# Patient Record
Sex: Male | Born: 1935 | Race: White | Hispanic: No | Smoking: Former smoker
Health system: Southern US, Community
[De-identification: ages and names within clinical notes are randomized; demographics above are authoritative.]

## PROBLEM LIST (undated history)

## (undated) DIAGNOSIS — E039 Hypothyroidism, unspecified: Secondary | ICD-10-CM

## (undated) DIAGNOSIS — N183 Chronic kidney disease, stage 3 unspecified: Secondary | ICD-10-CM

## (undated) DIAGNOSIS — Z4502 Encounter for adjustment and management of automatic implantable cardiac defibrillator: Secondary | ICD-10-CM

## (undated) DIAGNOSIS — I10 Essential (primary) hypertension: Secondary | ICD-10-CM

## (undated) DIAGNOSIS — I5022 Chronic systolic (congestive) heart failure: Secondary | ICD-10-CM

## (undated) DIAGNOSIS — I739 Peripheral vascular disease, unspecified: Secondary | ICD-10-CM

## (undated) DIAGNOSIS — Z951 Presence of aortocoronary bypass graft: Secondary | ICD-10-CM

## (undated) DIAGNOSIS — J449 Chronic obstructive pulmonary disease, unspecified: Secondary | ICD-10-CM

## (undated) DIAGNOSIS — I480 Paroxysmal atrial fibrillation: Secondary | ICD-10-CM

## (undated) DIAGNOSIS — M109 Gout, unspecified: Secondary | ICD-10-CM

## (undated) DIAGNOSIS — N2889 Other specified disorders of kidney and ureter: Secondary | ICD-10-CM

## (undated) DIAGNOSIS — Z9862 Peripheral vascular angioplasty status: Secondary | ICD-10-CM

## (undated) HISTORY — PX: CARDIAC CATHETERIZATION: SHX172

## (undated) HISTORY — PX: CORONARY ARTERY BYPASS GRAFT: SHX141

---

## 2001-12-03 DIAGNOSIS — Z951 Presence of aortocoronary bypass graft: Secondary | ICD-10-CM

## 2001-12-03 HISTORY — DX: Presence of aortocoronary bypass graft: Z95.1

## 2002-04-28 ENCOUNTER — Ambulatory Visit (HOSPITAL_COMMUNITY): Admission: RE | Admit: 2002-04-28 | Discharge: 2002-04-28 | Payer: Self-pay | Admitting: Family Medicine

## 2002-04-28 ENCOUNTER — Encounter: Payer: Self-pay | Admitting: Family Medicine

## 2002-05-06 ENCOUNTER — Inpatient Hospital Stay (HOSPITAL_COMMUNITY): Admission: RE | Admit: 2002-05-06 | Discharge: 2002-05-17 | Payer: Self-pay | Admitting: Cardiovascular Disease

## 2002-05-06 ENCOUNTER — Encounter: Payer: Self-pay | Admitting: Cardiovascular Disease

## 2002-05-07 ENCOUNTER — Encounter: Payer: Self-pay | Admitting: Thoracic Surgery (Cardiothoracic Vascular Surgery)

## 2002-05-08 ENCOUNTER — Encounter: Payer: Self-pay | Admitting: Thoracic Surgery (Cardiothoracic Vascular Surgery)

## 2002-05-09 ENCOUNTER — Encounter: Payer: Self-pay | Admitting: Thoracic Surgery (Cardiothoracic Vascular Surgery)

## 2002-05-10 ENCOUNTER — Encounter: Payer: Self-pay | Admitting: Cardiothoracic Surgery

## 2002-05-13 ENCOUNTER — Encounter: Payer: Self-pay | Admitting: Thoracic Surgery (Cardiothoracic Vascular Surgery)

## 2002-05-29 ENCOUNTER — Encounter: Payer: Self-pay | Admitting: Family Medicine

## 2002-05-29 ENCOUNTER — Ambulatory Visit (HOSPITAL_COMMUNITY): Admission: RE | Admit: 2002-05-29 | Discharge: 2002-05-29 | Payer: Self-pay | Admitting: Family Medicine

## 2002-06-01 ENCOUNTER — Encounter
Admission: RE | Admit: 2002-06-01 | Discharge: 2002-06-01 | Payer: Self-pay | Admitting: Thoracic Surgery (Cardiothoracic Vascular Surgery)

## 2002-06-01 ENCOUNTER — Encounter: Payer: Self-pay | Admitting: Thoracic Surgery (Cardiothoracic Vascular Surgery)

## 2002-06-02 ENCOUNTER — Encounter (HOSPITAL_COMMUNITY): Admission: RE | Admit: 2002-06-02 | Discharge: 2002-07-02 | Payer: Self-pay | Admitting: *Deleted

## 2002-07-03 ENCOUNTER — Encounter (HOSPITAL_COMMUNITY): Admission: RE | Admit: 2002-07-03 | Discharge: 2002-08-02 | Payer: Self-pay | Admitting: *Deleted

## 2002-08-11 ENCOUNTER — Ambulatory Visit (HOSPITAL_COMMUNITY): Admission: RE | Admit: 2002-08-11 | Discharge: 2002-08-12 | Payer: Self-pay | Admitting: Cardiovascular Disease

## 2002-08-17 ENCOUNTER — Encounter: Payer: Self-pay | Admitting: Thoracic Surgery (Cardiothoracic Vascular Surgery)

## 2002-08-17 ENCOUNTER — Encounter
Admission: RE | Admit: 2002-08-17 | Discharge: 2002-08-17 | Payer: Self-pay | Admitting: Thoracic Surgery (Cardiothoracic Vascular Surgery)

## 2002-08-19 ENCOUNTER — Encounter (HOSPITAL_COMMUNITY): Admission: RE | Admit: 2002-08-19 | Discharge: 2002-09-18 | Payer: Self-pay | Admitting: *Deleted

## 2002-09-04 ENCOUNTER — Other Ambulatory Visit: Admission: RE | Admit: 2002-09-04 | Discharge: 2002-09-04 | Payer: Self-pay | Admitting: Family Medicine

## 2002-09-21 ENCOUNTER — Encounter (HOSPITAL_COMMUNITY): Admission: RE | Admit: 2002-09-21 | Discharge: 2002-10-21 | Payer: Self-pay | Admitting: *Deleted

## 2003-10-05 ENCOUNTER — Ambulatory Visit (HOSPITAL_COMMUNITY): Admission: RE | Admit: 2003-10-05 | Discharge: 2003-10-05 | Payer: Self-pay | Admitting: Family Medicine

## 2003-12-01 ENCOUNTER — Ambulatory Visit (HOSPITAL_COMMUNITY): Admission: RE | Admit: 2003-12-01 | Discharge: 2003-12-01 | Payer: Self-pay | Admitting: *Deleted

## 2003-12-02 ENCOUNTER — Inpatient Hospital Stay (HOSPITAL_COMMUNITY): Admission: AD | Admit: 2003-12-02 | Discharge: 2003-12-08 | Payer: Self-pay | Admitting: *Deleted

## 2003-12-23 ENCOUNTER — Ambulatory Visit (HOSPITAL_COMMUNITY): Admission: RE | Admit: 2003-12-23 | Discharge: 2003-12-23 | Payer: Self-pay | Admitting: Cardiovascular Disease

## 2004-03-03 ENCOUNTER — Encounter (HOSPITAL_COMMUNITY): Admission: RE | Admit: 2004-03-03 | Discharge: 2004-04-02 | Payer: Self-pay | Admitting: Pediatrics

## 2004-04-03 ENCOUNTER — Encounter (HOSPITAL_COMMUNITY): Admission: RE | Admit: 2004-04-03 | Discharge: 2004-05-03 | Payer: Self-pay | Admitting: *Deleted

## 2004-05-05 ENCOUNTER — Encounter (HOSPITAL_COMMUNITY): Admission: RE | Admit: 2004-05-05 | Discharge: 2004-06-04 | Payer: Self-pay | Admitting: *Deleted

## 2004-12-07 ENCOUNTER — Ambulatory Visit (HOSPITAL_COMMUNITY): Admission: RE | Admit: 2004-12-07 | Discharge: 2004-12-07 | Payer: Self-pay | Admitting: Family Medicine

## 2006-04-19 ENCOUNTER — Ambulatory Visit (HOSPITAL_COMMUNITY): Admission: RE | Admit: 2006-04-19 | Discharge: 2006-04-19 | Payer: Self-pay | Admitting: Internal Medicine

## 2006-07-23 ENCOUNTER — Ambulatory Visit (HOSPITAL_COMMUNITY): Admission: RE | Admit: 2006-07-23 | Discharge: 2006-07-23 | Payer: Self-pay | Admitting: *Deleted

## 2007-01-30 ENCOUNTER — Ambulatory Visit (HOSPITAL_COMMUNITY): Admission: RE | Admit: 2007-01-30 | Discharge: 2007-01-30 | Payer: Self-pay | Admitting: Family Medicine

## 2007-02-26 ENCOUNTER — Ambulatory Visit (HOSPITAL_COMMUNITY): Admission: RE | Admit: 2007-02-26 | Discharge: 2007-02-26 | Payer: Self-pay | Admitting: Family Medicine

## 2007-03-12 ENCOUNTER — Ambulatory Visit (HOSPITAL_COMMUNITY): Admission: RE | Admit: 2007-03-12 | Discharge: 2007-03-12 | Payer: Self-pay | Admitting: Family Medicine

## 2007-03-26 ENCOUNTER — Emergency Department (HOSPITAL_COMMUNITY): Admission: EM | Admit: 2007-03-26 | Discharge: 2007-03-26 | Payer: Self-pay | Admitting: Emergency Medicine

## 2007-09-03 ENCOUNTER — Ambulatory Visit (HOSPITAL_COMMUNITY): Admission: RE | Admit: 2007-09-03 | Discharge: 2007-09-03 | Payer: Self-pay | Admitting: Internal Medicine

## 2007-09-28 ENCOUNTER — Emergency Department (HOSPITAL_COMMUNITY): Admission: EM | Admit: 2007-09-28 | Discharge: 2007-09-28 | Payer: Self-pay | Admitting: Emergency Medicine

## 2007-11-05 ENCOUNTER — Ambulatory Visit (HOSPITAL_COMMUNITY): Admission: RE | Admit: 2007-11-05 | Discharge: 2007-11-05 | Payer: Self-pay | Admitting: Family Medicine

## 2007-11-06 ENCOUNTER — Inpatient Hospital Stay (HOSPITAL_COMMUNITY): Admission: EM | Admit: 2007-11-06 | Discharge: 2007-11-11 | Payer: Self-pay | Admitting: Emergency Medicine

## 2007-11-19 ENCOUNTER — Ambulatory Visit (HOSPITAL_COMMUNITY): Admission: RE | Admit: 2007-11-19 | Discharge: 2007-11-19 | Payer: Self-pay | Admitting: Gastroenterology

## 2007-11-24 ENCOUNTER — Ambulatory Visit: Payer: Self-pay | Admitting: Oncology

## 2007-12-16 LAB — CBC & DIFF AND RETIC
Eosinophils Absolute: 0.2 10*3/uL (ref 0.0–0.5)
HCT: 31.6 % — ABNORMAL LOW (ref 38.7–49.9)
LYMPH%: 13.6 % — ABNORMAL LOW (ref 14.0–48.0)
MONO#: 0.5 10*3/uL (ref 0.1–0.9)
NEUT#: 7.6 10*3/uL — ABNORMAL HIGH (ref 1.5–6.5)
NEUT%: 79.3 % — ABNORMAL HIGH (ref 40.0–75.0)
Platelets: 273 10*3/uL (ref 145–400)
RBC: 4.01 10*6/uL — ABNORMAL LOW (ref 4.20–5.71)
Retic %: 1.6 % (ref 0.7–2.3)
WBC: 9.6 10*3/uL (ref 4.0–10.0)

## 2007-12-18 LAB — ERYTHROPOIETIN: Erythropoietin: 48.8 m[IU]/mL — ABNORMAL HIGH (ref 2.6–34.0)

## 2007-12-18 LAB — SPEP & IFE WITH QIG
Albumin ELP: 49.4 % — ABNORMAL LOW (ref 55.8–66.1)
Alpha-1-Globulin: 6.3 % — ABNORMAL HIGH (ref 2.9–4.9)
Alpha-2-Globulin: 17.7 % — ABNORMAL HIGH (ref 7.1–11.8)
Gamma Globulin: 13.9 % (ref 11.1–18.8)
IgM, Serum: 158 mg/dL (ref 60–263)

## 2007-12-18 LAB — COMPREHENSIVE METABOLIC PANEL
Albumin: 3.9 g/dL (ref 3.5–5.2)
Alkaline Phosphatase: 69 U/L (ref 39–117)
Glucose, Bld: 468 mg/dL — ABNORMAL HIGH (ref 70–99)
Potassium: 4.2 mEq/L (ref 3.5–5.3)
Sodium: 137 mEq/L (ref 135–145)
Total Protein: 7.1 g/dL (ref 6.0–8.3)

## 2007-12-18 LAB — KAPPA/LAMBDA LIGHT CHAINS: Kappa:Lambda Ratio: 0.93 (ref 0.26–1.65)

## 2007-12-18 LAB — VITAMIN B12: Vitamin B-12: 503 pg/mL (ref 211–911)

## 2007-12-18 LAB — FOLATE: Folate: >20 ng/mL

## 2007-12-18 LAB — IRON AND TIBC: UIBC: 382 ug/dL

## 2008-01-12 ENCOUNTER — Ambulatory Visit: Payer: Self-pay | Admitting: Oncology

## 2008-01-14 LAB — CBC WITH DIFFERENTIAL/PLATELET
BASO%: 0.5 % (ref 0.0–2.0)
Basophils Absolute: 0 10*3/uL (ref 0.0–0.1)
EOS%: 1.9 % (ref 0.0–7.0)
HGB: 10.7 g/dL — ABNORMAL LOW (ref 13.0–17.1)
MCH: 26.3 pg — ABNORMAL LOW (ref 28.0–33.4)
MCHC: 32.4 g/dL (ref 32.0–35.9)
MCV: 81 fL — ABNORMAL LOW (ref 81.6–98.0)
MONO%: 8 % (ref 0.0–13.0)
RBC: 4.07 10*6/uL — ABNORMAL LOW (ref 4.20–5.71)
RDW: 23 % — ABNORMAL HIGH (ref 11.2–14.6)
lymph#: 1.2 10*3/uL (ref 0.9–3.3)

## 2008-01-14 LAB — IRON AND TIBC
Iron: 45 ug/dL (ref 42–165)
TIBC: 283 ug/dL (ref 215–435)
UIBC: 238 ug/dL

## 2008-04-09 ENCOUNTER — Ambulatory Visit: Payer: Self-pay | Admitting: Oncology

## 2008-04-13 LAB — CBC WITH DIFFERENTIAL/PLATELET
BASO%: 1.4 % (ref 0.0–2.0)
Basophils Absolute: 0.1 10*3/uL (ref 0.0–0.1)
Eosinophils Absolute: 0.2 10*3/uL (ref 0.0–0.5)
HCT: 35.7 % — ABNORMAL LOW (ref 38.7–49.9)
HGB: 12.3 g/dL — ABNORMAL LOW (ref 13.0–17.1)
MCHC: 34.4 g/dL (ref 32.0–35.9)
MONO#: 0.6 10*3/uL (ref 0.1–0.9)
NEUT#: 5.6 10*3/uL (ref 1.5–6.5)
NEUT%: 70.9 % (ref 40.0–75.0)
WBC: 7.9 10*3/uL (ref 4.0–10.0)
lymph#: 1.5 10*3/uL (ref 0.9–3.3)

## 2008-04-13 LAB — FERRITIN: Ferritin: 41 ng/mL (ref 22–322)

## 2008-04-13 LAB — COMPREHENSIVE METABOLIC PANEL
ALT: 8 U/L (ref 0–53)
BUN: 29 mg/dL — ABNORMAL HIGH (ref 6–23)
CO2: 23 mEq/L (ref 19–32)
Calcium: 8.8 mg/dL (ref 8.4–10.5)
Creatinine, Ser: 1.53 mg/dL — ABNORMAL HIGH (ref 0.40–1.50)
Total Bilirubin: 0.6 mg/dL (ref 0.3–1.2)

## 2008-04-13 LAB — IRON AND TIBC
%SAT: 16 % — ABNORMAL LOW (ref 20–55)
Iron: 49 ug/dL (ref 42–165)
TIBC: 316 ug/dL (ref 215–435)

## 2008-05-20 ENCOUNTER — Inpatient Hospital Stay (HOSPITAL_COMMUNITY): Admission: EM | Admit: 2008-05-20 | Discharge: 2008-05-27 | Payer: Self-pay | Admitting: Emergency Medicine

## 2008-08-26 ENCOUNTER — Ambulatory Visit (HOSPITAL_COMMUNITY): Admission: RE | Admit: 2008-08-26 | Discharge: 2008-08-26 | Payer: Self-pay | Admitting: Family Medicine

## 2009-04-02 ENCOUNTER — Observation Stay (HOSPITAL_COMMUNITY): Admission: EM | Admit: 2009-04-02 | Discharge: 2009-04-02 | Payer: Self-pay | Admitting: *Deleted

## 2009-04-02 ENCOUNTER — Encounter: Payer: Self-pay | Admitting: Emergency Medicine

## 2009-12-09 ENCOUNTER — Ambulatory Visit (HOSPITAL_COMMUNITY): Admission: RE | Admit: 2009-12-09 | Discharge: 2009-12-09 | Payer: Self-pay | Admitting: Internal Medicine

## 2009-12-12 ENCOUNTER — Inpatient Hospital Stay (HOSPITAL_COMMUNITY): Admission: AD | Admit: 2009-12-12 | Discharge: 2009-12-17 | Payer: Self-pay

## 2010-04-02 DIAGNOSIS — Z4502 Encounter for adjustment and management of automatic implantable cardiac defibrillator: Secondary | ICD-10-CM

## 2010-04-02 HISTORY — DX: Encounter for adjustment and management of automatic implantable cardiac defibrillator: Z45.02

## 2010-04-03 ENCOUNTER — Inpatient Hospital Stay (HOSPITAL_COMMUNITY): Admission: RE | Admit: 2010-04-03 | Discharge: 2010-04-04 | Payer: Self-pay | Admitting: Cardiovascular Disease

## 2010-05-29 ENCOUNTER — Ambulatory Visit (HOSPITAL_COMMUNITY): Admission: RE | Admit: 2010-05-29 | Discharge: 2010-05-29 | Payer: Self-pay | Admitting: Cardiovascular Disease

## 2011-02-05 ENCOUNTER — Other Ambulatory Visit (HOSPITAL_COMMUNITY): Payer: Self-pay | Admitting: Cardiovascular Disease

## 2011-02-05 ENCOUNTER — Ambulatory Visit (HOSPITAL_COMMUNITY)
Admission: RE | Admit: 2011-02-05 | Discharge: 2011-02-05 | Disposition: A | Discharge status: Home / Self Care | Payer: Medicare Other | Source: Ambulatory Visit | Attending: Cardiovascular Disease | Admitting: Cardiovascular Disease

## 2011-02-05 DIAGNOSIS — R609 Edema, unspecified: Secondary | ICD-10-CM

## 2011-02-05 DIAGNOSIS — M25549 Pain in joints of unspecified hand: Secondary | ICD-10-CM | POA: Insufficient documentation

## 2011-02-05 DIAGNOSIS — M899 Disorder of bone, unspecified: Secondary | ICD-10-CM | POA: Insufficient documentation

## 2011-02-05 DIAGNOSIS — M25449 Effusion, unspecified hand: Secondary | ICD-10-CM | POA: Insufficient documentation

## 2011-02-18 LAB — COMPREHENSIVE METABOLIC PANEL
AST: 25 U/L (ref 0–37)
BUN: 34 mg/dL — ABNORMAL HIGH (ref 6–23)
CO2: 26 mEq/L (ref 19–32)
Calcium: 9.1 mg/dL (ref 8.4–10.5)
Chloride: 106 mEq/L (ref 96–112)
Creatinine, Ser: 2.41 mg/dL — ABNORMAL HIGH (ref 0.4–1.5)
GFR calc Af Amer: 32 mL/min — ABNORMAL LOW (ref >60)
GFR calc non Af Amer: 27 mL/min — ABNORMAL LOW (ref >60)
Glucose, Bld: 229 mg/dL — ABNORMAL HIGH (ref 70–99)
Total Bilirubin: 0.7 mg/dL (ref 0.3–1.2)

## 2011-02-18 LAB — GLUCOSE, CAPILLARY
Glucose-Capillary: 174 mg/dL — ABNORMAL HIGH (ref 70–99)
Glucose-Capillary: 231 mg/dL — ABNORMAL HIGH (ref 70–99)
Glucose-Capillary: 274 mg/dL — ABNORMAL HIGH (ref 70–99)
Glucose-Capillary: 278 mg/dL — ABNORMAL HIGH (ref 70–99)
Glucose-Capillary: 282 mg/dL — ABNORMAL HIGH (ref 70–99)
Glucose-Capillary: 296 mg/dL — ABNORMAL HIGH (ref 70–99)
Glucose-Capillary: 319 mg/dL — ABNORMAL HIGH (ref 70–99)
Glucose-Capillary: 360 mg/dL — ABNORMAL HIGH (ref 70–99)
Glucose-Capillary: 361 mg/dL — ABNORMAL HIGH (ref 70–99)
Glucose-Capillary: 383 mg/dL — ABNORMAL HIGH (ref 70–99)
Glucose-Capillary: 415 mg/dL — ABNORMAL HIGH (ref 70–99)

## 2011-02-18 LAB — DIFFERENTIAL
Basophils Absolute: 0 10*3/uL (ref 0.0–0.1)
Basophils Absolute: 0 10*3/uL (ref 0.0–0.1)
Basophils Absolute: 0 10*3/uL (ref 0.0–0.1)
Basophils Relative: 0 % (ref 0–1)
Basophils Relative: 0 % (ref 0–1)
Basophils Relative: 1 % (ref 0–1)
Eosinophils Absolute: 0.2 10*3/uL (ref 0.0–0.7)
Eosinophils Absolute: 0.3 10*3/uL (ref 0.0–0.7)
Eosinophils Absolute: 0.4 10*3/uL (ref 0.0–0.7)
Eosinophils Relative: 2 % (ref 0–5)
Eosinophils Relative: 3 % (ref 0–5)
Lymphocytes Relative: 13 % (ref 12–46)
Lymphocytes Relative: 18 % (ref 12–46)
Lymphs Abs: 1.2 10*3/uL (ref 0.7–4.0)
Lymphs Abs: 1.3 10*3/uL (ref 0.7–4.0)
Lymphs Abs: 2 10*3/uL (ref 0.7–4.0)
Monocytes Absolute: 0.7 10*3/uL (ref 0.1–1.0)
Monocytes Absolute: 0.9 10*3/uL (ref 0.1–1.0)
Monocytes Relative: 6 % (ref 3–12)
Neutro Abs: 8.2 10*3/uL — ABNORMAL HIGH (ref 1.7–7.7)
Neutro Abs: 9.4 10*3/uL — ABNORMAL HIGH (ref 1.7–7.7)
Neutrophils Relative %: 71 % (ref 43–77)
Neutrophils Relative %: 73 % (ref 43–77)
Neutrophils Relative %: 75 % (ref 43–77)
Neutrophils Relative %: 76 % (ref 43–77)
Neutrophils Relative %: 79 % — ABNORMAL HIGH (ref 43–77)

## 2011-02-18 LAB — URINALYSIS, ROUTINE W REFLEX MICROSCOPIC
Glucose, UA: 500 mg/dL — AB
Glucose, UA: NEGATIVE mg/dL
Hgb urine dipstick: NEGATIVE
Ketones, ur: NEGATIVE mg/dL
Nitrite: NEGATIVE
Protein, ur: NEGATIVE mg/dL
Protein, ur: NEGATIVE mg/dL
pH: 5.5 (ref 5.0–8.0)
pH: 6 (ref 5.0–8.0)

## 2011-02-18 LAB — BASIC METABOLIC PANEL
BUN: 27 mg/dL — ABNORMAL HIGH (ref 6–23)
BUN: 33 mg/dL — ABNORMAL HIGH (ref 6–23)
CO2: 23 mEq/L (ref 19–32)
CO2: 26 mEq/L (ref 19–32)
Calcium: 9 mg/dL (ref 8.4–10.5)
Calcium: 9.1 mg/dL (ref 8.4–10.5)
Calcium: 9.3 mg/dL (ref 8.4–10.5)
Calcium: 9.4 mg/dL (ref 8.4–10.5)
Chloride: 104 mEq/L (ref 96–112)
Chloride: 99 mEq/L (ref 96–112)
Creatinine, Ser: 2.05 mg/dL — ABNORMAL HIGH (ref 0.4–1.5)
Creatinine, Ser: 2.38 mg/dL — ABNORMAL HIGH (ref 0.4–1.5)
GFR calc Af Amer: 32 mL/min — ABNORMAL LOW (ref >60)
GFR calc Af Amer: 35 mL/min — ABNORMAL LOW (ref >60)
GFR calc Af Amer: 39 mL/min — ABNORMAL LOW (ref >60)
GFR calc Af Amer: 43 mL/min — ABNORMAL LOW (ref >60)
GFR calc non Af Amer: 26 mL/min — ABNORMAL LOW (ref >60)
GFR calc non Af Amer: 29 mL/min — ABNORMAL LOW (ref >60)
Glucose, Bld: 294 mg/dL — ABNORMAL HIGH (ref 70–99)
Glucose, Bld: 327 mg/dL — ABNORMAL HIGH (ref 70–99)
Potassium: 3.6 mEq/L (ref 3.5–5.1)
Sodium: 136 mEq/L (ref 135–145)
Sodium: 137 mEq/L (ref 135–145)
Sodium: 137 mEq/L (ref 135–145)

## 2011-02-18 LAB — CBC
HCT: 33.3 % — ABNORMAL LOW (ref 39.0–52.0)
HCT: 33.5 % — ABNORMAL LOW (ref 39.0–52.0)
Hemoglobin: 11.1 g/dL — ABNORMAL LOW (ref 13.0–17.0)
Hemoglobin: 11.3 g/dL — ABNORMAL LOW (ref 13.0–17.0)
Hemoglobin: 11.4 g/dL — ABNORMAL LOW (ref 13.0–17.0)
MCHC: 33.2 g/dL (ref 30.0–36.0)
MCHC: 33.7 g/dL (ref 30.0–36.0)
MCHC: 33.8 g/dL (ref 30.0–36.0)
MCHC: 34.1 g/dL (ref 30.0–36.0)
MCV: 91.5 fL (ref 78.0–100.0)
MCV: 91.6 fL (ref 78.0–100.0)
MCV: 92.4 fL (ref 78.0–100.0)
MCV: 92.5 fL (ref 78.0–100.0)
Platelets: 182 10*3/uL (ref 150–400)
Platelets: 195 10*3/uL (ref 150–400)
RBC: 3.6 MIL/uL — ABNORMAL LOW (ref 4.22–5.81)
RBC: 3.66 MIL/uL — ABNORMAL LOW (ref 4.22–5.81)
RDW: 14.1 % (ref 11.5–15.5)
RDW: 14.4 % (ref 11.5–15.5)
WBC: 11.8 10*3/uL — ABNORMAL HIGH (ref 4.0–10.5)
WBC: 9.4 10*3/uL (ref 4.0–10.5)
WBC: 9.6 10*3/uL (ref 4.0–10.5)

## 2011-02-18 LAB — URINE CULTURE

## 2011-02-18 LAB — VANCOMYCIN, TROUGH: Vancomycin Tr: 10.6 ug/mL (ref 10.0–20.0)

## 2011-02-18 LAB — CULTURE, BLOOD (ROUTINE X 2)
Report Status: 1152011
Report Status: 1152011

## 2011-02-18 LAB — HEMOGLOBIN A1C
Hgb A1c MFr Bld: 9.3 % — ABNORMAL HIGH (ref 4.6–6.1)
Mean Plasma Glucose: 220 mg/dL

## 2011-02-20 LAB — SURGICAL PCR SCREEN
MRSA, PCR: NEGATIVE
Staphylococcus aureus: NEGATIVE

## 2011-02-20 LAB — GLUCOSE, CAPILLARY
Glucose-Capillary: 212 mg/dL — ABNORMAL HIGH (ref 70–99)
Glucose-Capillary: 232 mg/dL — ABNORMAL HIGH (ref 70–99)
Glucose-Capillary: 380 mg/dL — ABNORMAL HIGH (ref 70–99)

## 2011-02-20 LAB — BASIC METABOLIC PANEL
Calcium: 9.5 mg/dL (ref 8.4–10.5)
GFR calc non Af Amer: 52 mL/min — ABNORMAL LOW (ref >60)
Glucose, Bld: 222 mg/dL — ABNORMAL HIGH (ref 70–99)
Potassium: 4.1 mEq/L (ref 3.5–5.1)
Sodium: 140 mEq/L (ref 135–145)

## 2011-03-13 LAB — CBC
Platelets: 139 10*3/uL — ABNORMAL LOW (ref 150–400)
RDW: 16.9 % — ABNORMAL HIGH (ref 11.5–15.5)
WBC: 9.3 10*3/uL (ref 4.0–10.5)

## 2011-03-13 LAB — POCT I-STAT 4, (NA,K, GLUC, HGB,HCT)
Glucose, Bld: 87 mg/dL (ref 70–99)
HCT: 38 % — ABNORMAL LOW (ref 39.0–52.0)
Hemoglobin: 12.9 g/dL — ABNORMAL LOW (ref 13.0–17.0)
Sodium: 143 mEq/L (ref 135–145)

## 2011-03-13 LAB — DIFFERENTIAL
Basophils Absolute: 0 10*3/uL (ref 0.0–0.1)
Lymphocytes Relative: 17 % (ref 12–46)
Neutro Abs: 6.7 10*3/uL (ref 1.7–7.7)
Neutrophils Relative %: 72 % (ref 43–77)

## 2011-03-13 LAB — GLUCOSE, CAPILLARY

## 2011-03-13 LAB — BASIC METABOLIC PANEL
BUN: 23 mg/dL (ref 6–23)
Calcium: 8.9 mg/dL (ref 8.4–10.5)
GFR calc non Af Amer: 53 mL/min — ABNORMAL LOW (ref >60)
Glucose, Bld: 172 mg/dL — ABNORMAL HIGH (ref 70–99)
Sodium: 141 mEq/L (ref 135–145)

## 2011-04-17 NOTE — Op Note (Signed)
[name], [name]               ACCOUNT NO.:  [id number]   MEDICAL RECORD NO.:  [id number]          PATIENT TYPE:  INP   LOCATION:  6737                         FACILITY:  [facility]   PHYSICIAN:  [name]., M.D.DATE OF BIRTH:  09-11-1935   DATE OF PROCEDURE:  11/07/2007  DATE OF DISCHARGE:                               OPERATIVE REPORT   PROCEDURE:  Esophagogastroduodenoscopy.   MEDICATIONS:  Cetacaine spray, fentanyl 75 mcg, Versed 7 mg IV.   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   DESCRIPTION OF PROCEDURE:  The procedure had been explained to the  patient and consent obtained.  With the patient in left lateral  decubitus position, the Pentax upper scope was inserted into the stomach  and advanced.  The pylorus was identified and passed to the second  duodenum.  The duodenal bulb was seen well and was normal.  Scope was  withdrawn back in the stomach where channel, antrum and body were seen  well and were normal.  The fundus and cardia were seen well in the  retroflexed view and were normal.  There was a small hiatal hernia.  No  active bleeding seen anywhere in the stomach or duodenum.  The distal  and proximal esophagus were endoscopically normal.  There was no signs  of any bleeding in the esophagus.  No varices, etc.  The scope was  withdrawn.  The patient tolerated the procedure well.   ASSESSMENT:  Melena with drop in hemoglobin in a gentleman on Coumadin  with no obvious source on upper gastrointestinal endoscopy.   PLAN:  Will let INR normalized.  If he still continues to bleed, we may  need a repeat colonoscopy or consider a small bowel capsule endoscopy or  bleeding scan.           ______________________________  Llana Aliment. Malon Kindle., M.D.     Waldron Session  D:  11/07/2007  T:  11/08/2007  Job:  [id number]   cc:   Corrie Mckusick, M.D.  Hilda Lias, M.D.  Dani Gobble, MD  Llana Aliment. Malon Kindle., M.D.

## 2011-04-17 NOTE — Consult Note (Signed)
[name], [name]               ACCOUNT NO.:  [id number]   MEDICAL RECORD NO.:  [id number]          PATIENT TYPE:  INP   LOCATION:  A325                          FACILITY:  [facility]   PHYSICIAN:  J. Darreld Mclean, M.D. DATE OF BIRTH:  12-21-1934   DATE OF CONSULTATION:  05/25/2008  DATE OF DISCHARGE:                                 CONSULTATION   The patient complains of pain and tenderness of his right elbow, his  left shoulder, and his left knee.  He says he fell in the room the other  day and injured his right elbow.  His right elbow has a mild effusion  and pain.  Examination of supination lacks full extension by 10 degrees,  flexion only to about 70 degrees.  There is no ecchymosis.  Grip  strength is slightly decreased.  Neurovascular otherwise intact.   His left shoulder has pain and tenderness to any motion.  It is a  diffuse pain, diffuse tenderness, and no ecchymosis.  Passive motion is  painful.  He came up to 90 degrees of abduction and 90 degrees of  forward flexion with pain.   His left knee, he complains of pain and tenderness laterally, more over  the distal femur and over the lateral collateral ligament, but there is  very minimal effusion and some slight crepitus.  Range of motion here is  from 5 degrees to about 60 degrees and he complains of pain.  Drawer  sign appears to be negative as best I can tell.  Lachman's negative.   IMPRESSION:  Left shoulder pain, right elbow pain, possible radial head  fracture, and left knee pain.   X-rays of the left shoulder showed mild degenerative joint disease with  some changes at the distal clavicle.  I will get x-rays of the left knee  and the right elbow.   He is getting Solu-Medrol now as for his neck and it should help his  joints.  I would like to make sure if he really did have a radial head  fracture on the right.  Repeat these x-rays done and I will come back  and check on them later.     ______________________________  J. Darreld Mclean, M.D.     JWK/MEDQ  D:  05/25/2008  T:  05/26/2008  Job:  [id number]

## 2011-04-17 NOTE — Op Note (Signed)
[name], [name]               ACCOUNT NO.:  [id number]   MEDICAL RECORD NO.:  [id number]          PATIENT TYPE:  OBV   LOCATION:  2550                         FACILITY:  [facility]   PHYSICIAN:  Newman Pies, MD            DATE OF BIRTH:  13-Apr-1935   DATE OF PROCEDURE:  04/02/2009  DATE OF DISCHARGE:  04/02/2009                               OPERATIVE REPORT   SURGEON:  Newman Pies, MD   PREOPERATIVE DIAGNOSIS:  Pharyngeal foreign body.   POSTOPERATIVE DIAGNOSIS:  Pharyngeal foreign body.   PROCEDURE PERFORMED:  Direct laryngoscopy and foreign body removal.   ANESTHESIA:  General endotracheal tube anesthesia.   COMPLICATIONS:  None.   ESTIMATED BLOOD LOSS:  None.   INDICATIONS FOR PROCEDURE:  The patient is a 76 year old male with a  history of foreign body lodged within his hypopharynx.  The incident  occurred earlier today, after he tried to clean a penny in his mouth.  The patient's great granddaughter jumped on his back, he accidentally  inhaled the coin.  On chest x-ray, the coin was noted to be situated  within the left hypopharynx.  Based on the above findings, the decision  was made for the patient to undergo direct laryngoscopy and removal of  the foreign body.  The risks, benefits, and details of the procedure  were discussed with the patient.  Questions were invited and answered.  Informed consent was obtained.   DESCRIPTION:  The patient was taken to the operating room and placed  supine on the operating table.  General endotracheal tube anesthesia was  administered by the anesthesiologist.  The patient was positioned and  prepped and draped in a standard fashion for direct laryngoscopy.  Dedo  laryngoscope was inserted into the oral cavity.  The pharyngeal mucosa,  vallecula, epiglottis, aryepiglottic folds, false and true vocal cords  were all normal.  A penny was noted to be lodged within the left  piriform sinus.  The foreign body was removed with a cup forceps  without  difficulty.  Subsequent evaluation of the piriform sinuses and the  surrounding mucosa showed no evidence of injury.  The Dedo laryngoscope  was removed.  That concluded the procedure for the patient.  The patient  was awakened from anesthesia without difficulty.  He was transferred to  the recovery room in good condition.   OPERATIVE FINDINGS:  A coin was noted within the left piriform sinus.  No mucosal injury was noted.   SPECIMENS REMOVED:  Hypopharyngeal foreign body.   FOLLOWUP CARE:  The patient will be discharged home.  The patient will  follow up on a p.r.n. basis.      Newman Pies, MD  Electronically Signed     ST/MEDQ  D:  04/02/2009  T:  04/03/2009  Job:  [id number]

## 2011-04-17 NOTE — Discharge Summary (Signed)
NAMEJERRETT, [name]               ACCOUNT NO.:  [id number]   MEDICAL RECORD NO.:  [id number]          PATIENT TYPE:  INP   LOCATION:  6737                         FACILITY:  [facility]   PHYSICIAN:  Skeet Latch, DO    DATE OF BIRTH:  1935/05/14   DATE OF ADMISSION:  11/06/2007  DATE OF DISCHARGE:  11/11/2007                               DISCHARGE SUMMARY   ADDENDUM:  [name] is okay to be discharged today.  His discharge was  pending a repeat hemoglobin.  His hemoglobin at this time is 9.4,  hematocrit 28.7.  On November 10, 2007, it was 9.5, and 28.6.  The  patient is stable.  Has no complaints of pain, no obvious signs of  bleeding, and is okay to be discharged.   PATIENT INSTRUCTIONS:  Follow up with his primary care physician and any  specialists on his discharge summary.  The patient understands these  instructions.      Skeet Latch, DO  Electronically Signed     SM/MEDQ  D:  11/11/2007  T:  11/11/2007  Job:  [id number]

## 2011-04-17 NOTE — Discharge Summary (Signed)
[name], [name]               ACCOUNT NO.:  [id number]   MEDICAL RECORD NO.:  [id number]          PATIENT TYPE:  INP   LOCATION:  6737                         [facility]ACILITY:  [facility]   PHYSICIAN:  Dorris Singh, DO    DATE OF BIRTH:  [birth year]   DATE OF ADMISSION:  11/06/2007  DATE OF DISCHARGE:                               DISCHARGE SUMMARY   ADMISSION DIAGNOSES:  1. Lower gastrointestinal bleed.  2. Acute blood loss anemia secondary to gastrointestinal bleed.  3. History of coronary artery disease status post coronary artery      bypass graft.  4. Diabetes type 2.  5. Hypertension.  6. History of chronic kidney disease.  7. Paroxysmal atrial fibrillation.  8. History of congestive heart failure.  9. Mild hyponatremia.  10.Hypokalemia.   DISCHARGE DIAGNOSES:  1. Anemia.  2. Gastrointestinal bleed.  3. Diabetes type 2.  4. Acute on chronic renal disease which is resolved.  5. Coronary artery disease and cardiomegaly.  6. Paroxysmal atrial fibrillation with history of coagulopathy on      Coumadin.   PRIMARY CARE PHYSICIAN:  Dr. Assunta Found.   CONSULTATIONS:  GI, Dr. Bosie Clos.   PROCEDURE:  EGD on November 07, 2007 as well as a colonoscopy done on  November 10, 2007.   RADIOGRAPHIC IMAGES:  MRI of the spine without contrast which is done on  November 05, 2007 which showed multiple abnormalities 1) C3 through 4  pronounced right-sided spondylolysis, osteophyte and disk material that  narrowed the right side of the canal and deformed the right side of the  cord, right foraminal encroachment is likely seen to effect the right C4  nerve root. 2) Small central disk protrusion at C4-5 without gross  neural compression. 3) Large broad-based disk herniation at C5-6 that  narrowed the canal effacing the subarachnoid face and deforming the  cord. I think there is an early abnormal T2 signal in the cord at this  level. There is foraminal encroachment bilaterally that one could  expect  to affect both C6 nerve roots. 4) Chronic spondylosis at C7 with  narrowing of the vertebral subarachnoid space with no cord compression.  Foraminal encroachment by osteophytes right more than left. 5) Chronic  spondylolysis at C7-T1 with narrowing of the ventral subarachnoid space  with no compression of the cord. Foraminal encroachment by osteophytes  right more than left. On November 07, 2007, he had a two-view chest x-ray  which showed chronic bronchitic changes suspicious for mild vascular  congestion.   HISTORY OF PRESENT ILLNESS:  His H&P was done by Dr. Corky Downs, please  refer to that. To summarize, the patient is a 76 year old Caucasian male  who has a history of atrial fibrillation whose been on anticoagulation  for years. The patient noticed he had bright red blood per rectum in  early November, did not think much about it but continued to have it. He  was seen at his neurosurgeon's office and the patient was noted to be  pale. He was sent to the emergency room and had a hemoglobin of 6.1. He  was then admitted  to the service of [facility] where he was transfused 3  units of packed red blood cells. Also GI was consulted. All of his  anticoagulants were held. He was monitored for any bright red blood per  rectum. After his 3 units of packed red blood cells, his hemoglobin went  from 6.1 to 8.4. Also his INR was supratherapeutic. He was given vitamin  K to bring it down to 2.5. His kidney function was also monitored. He  did have a history of an abnormal EKG but his cardiac enzymes were  within normal limits. GI was then consulted on November 07, 2007. It was  determined that the patient would be scheduled for an EGD. At the EGD,  they could not find a source for the bleeding with the EGD. The  patient's last colonoscopy was in October 2008. They decided it was  probably not likely due to a GI bleed but possibly due to his  supratherapeutic INR. He was continued to be  monitored and then on  February 6 had a black bowel movement and it was asked for GI to take a  look at him again. At that point in time, it was determined the patient  would have a colonoscopy for January 10, 2007 and continue to monitor  his hemoglobin and hematocrit. The patient had an uneventful night, had  a colonoscopy done. We did find diverticulosis but did not find a source  for any type of bleeding by Dr. Bosie Clos. While the patient was here, we  continued to monitor his kidney function and keep his blood sugars under  control. Iron studies were done that showed iron deficiency. It was  determined that the patient stable overnight, could be discharged in the  a.m. Will continue to monitor his H&H and a CBC in the morning. I spoke  with Dr. Jeral Fruit regarding planning his neurosurgery. At this point in  time, he would like to wait several weeks until he has stabilized.  Discussed this with patient. Also the patient's PCP is Dr. Assunta Found  in [place]. Will have him followup with Dr. Phillips Odor as well. The plan  will be to have him be discharged tomorrow if his CBC is stable in the  morning.   CONDITION:  Stable.   DISPOSITION:  To home. He will be sent home on the following  medications:   DISCHARGE MEDICATIONS:  1. Altace 2.5 one p.o. daily.  2. Lexapro 10 mg 1 p.o. daily.  3. Lasix 80 mg 1 p.o. daily.  4. AcipHex 20 mg 1 p.o. daily.  5. Coreg 25 mg 1 b.i.d.  6. Vytorin 10/80 mg q.p.m.  7. Insulin 70/30, 80 units in the morning.  8. Insulin 70/30, 10 units at lunch.  9. Insulin 70/30, 70 units at dinner.   The patient is explained not to take any anticoagulants which include  his Coumadin doses and his daily aspirin until he can be seen by his  primary care physician who will monitor him and also he will have  appointments for the gastroenterologist to see in a few weeks to make  sure that he is stabilized. He will be sent hom on Nu-Iron 150 mg one  p.o. daily for  his iron deficiency.   Also his discharge summary reads that he has scheduled appointments with  Dr. Phillips Odor, Dr. Jeral Fruit and Dr. Bosie Clos. He will be on Nu-Iron. Like I  mentioned, he is to avoid all anticoagulants until seen by his primary  care physician  so they can get a plan as to when his best time is to  start it back up due to his A fib. He has been told that  he was diagnosed with severe anemia. The patient is to RTC to the  emergency room if symptoms worsen and followup with his primary care  physician as directed.   If patient is eligible to be discharged tomorrow all that will need to  be done is a discharge addendum.      Dorris Singh, DO  Electronically Signed     CB/MEDQ  D:  11/10/2007  T:  11/10/2007  Job:  [id number]   cc:   Corrie Mckusick, M.D.

## 2011-04-17 NOTE — Consult Note (Signed)
[name], [name]               ACCOUNT NO.:  [id number]   MEDICAL RECORD NO.:  [id number]          PATIENT TYPE:  INP   LOCATION:  A325                          FACILITY:  [facility]   PHYSICIAN:  Kofi A. Gerilyn Pilgrim, M.D. DATE OF BIRTH:  26-Jun-1935   DATE OF CONSULTATION:  05/25/2008  DATE OF DISCHARGE:                                 CONSULTATION   REASON FOR CONSULTATION:  Gait impairment.   The patient is a 76 year old white male who presented to the hospital a  few days of ago because of feeling nauseous.  He also had emesis and  watery bowel movement.  The patient was evaluated and was found to have  elevated blood sugar of 1000.  It appears that the patient fell prior to  being admitted to the hospital and complains of left shoulder pain and  right elbow pain.  He reports that this is the only time he has fallen  although, he reports that he fell previously and has had some problems  ambulating, the cause is unclear.  The patient indicates that he does  have degenerative disk disease of the neck and was supposed to have  surgery done in [place], but this apparently has been delayed.  There  had been some fluctuation and his blood sugars are ranging from the 70s  to 400s.   PAST MEDICAL HISTORY:  1. Diabetes mellitus.  2. Hypertension.  3. Prostate cancer.  4. Anemia.  5. Congestive heart failure with ejection fraction of 35%.   PAST SURGICAL HISTORY:  Status post appendectomy, hemorrhoidectomy, and  coronary artery bypass grafting.   ADMISSION MEDICATIONS:  Niaspan, Altace, Lasix, warfarin, Vytorin,  insulin, Aciphex, Coreg, Lexapro, Percocet, and cilostazol.   ALLERGIES:  None known.   SOCIAL HISTORY:  Lives in [place].  No tobacco use at least now over  the past 6 years.  He does have occasional alcohol use.   FAMILY HISTORY:  Positive for back problems and leukemia.   PHYSICAL EXAMINATION:  GENERAL:  A pleasant man in no acute distress.  VITAL SIGNS:   Temperature is 98.4, pulse 90, respirations 24, and blood  pressure 129/74.  HEENT:  Head is atraumatic and normocephalic.  NECK:  Supple.  ABDOMEN:  Soft.  EXTREMITIES:  There does appear to be significant pain with range of  motion of the right elbow and left shoulder.  There is also significant  pain involving the left leg.  He does have 1+ pitting edema involving  the left foot and leg.  There are also significant varicosities  involving both feet.  There is evidence of bruise involving the right  elbow.  MENTAL STATUS EVALUATION:  The patient is awake and alert.  He can dress  as well.  He does have reduced hearing bilaterally, but he follows  commands well.  He is otherwise lucid and coherent with normal  cognition.  CRANIAL NERVES:  Pupils are equal, round, and reactive to light and  accommodation.  Extraocular movements are intact.  Facial muscle  strength is symmetric.  Tongue is midline.  Uvula midline.  MOTOR EXAMINATION:  He has significant weakness of the upper  extremities, I believe mostly limited by pain, especially the left  deltoid which is about 3/5.  Proximal muscles involving the left upper  extremity shows actually good strength 5/5.  There is also some weakness  involving the deltoid on the right, but 4/5.  Triceps is also weak.  Again, it seems limited mostly by pain on the right side.  There is some  lower extremity weakness about 3/5 proximally on the left and 4/5 on the  right distally.  He has normal strength on the right leg and on the left  4+.  Reflexes are preserved in the upper extremities.  There is somewhat  brisk involving the right knee, normal in the left knee and somewhat  down in the ankles.  Plantar reflexes are both equivocal.  Coordination  shows no tremors, rigidity, dysmetria, or parkinsonism.  The patient's  gait, he required maximal assistant to stand.  Gait is slightly wide and  mostly antalgic, somewhat unsteady otherwise nonspecific.   MRI of his  brain was reviewed and the impression is that it shows mild-to-moderate  generalized atrophy.  There is mild confluent paraventricular white  matter disease in the paraventricular region, both at the anterior and  posterior horns of the lateral ventricles, nothing acute is seen.  Also,  a cervical spine MRI was reviewed and he has a central extrusion with  moderate-to-severe spinal stenosis.  There is also multilevel bilateral  foraminal stenosis.  There is a mild mass effect on the cord, but there  is no abnormalities noted within the cord at the C5-C6 level.   The patient did have some significant abnormalities in his labs, on his  presentation 3 days ago.  Sodium is 120, potassium 4.5, chloride 78, CO2  26, BUN 87, creatinine 3, and glucose 1098.  Repeat today is sodium 139,  potassium 3.6, chloride 104, CO2 30, BUN 18, creatinine 1.2, and glucose  131.  Ammonia 16, TSH 1.3, INR 2.2, WBC 13, hemoglobin 12.7 and platelet  count 208.   ASSESSMENT:  I think, this patient has a multifactorial gait impairment  and multifactorial weekness of the upper extremities.  I believe, he  clearly has some evidence on MRI of degenerative disk disease which  could cause radiculopathy and explained some of the weakness to the  upper extremities.  It does not currently appears to be myopathic on  examination at least not overtly so.  I think pain seems to be a  significant limiting impairment in this patient's symptoms.   RECOMMENDATIONS:  I did discuss the case with Dr. Barnie Del and we  decided to give the patient a trial of high-dose steroids.  This was  also discussed with the patient, he will have to be kept in the hospital  as his blood sugar would likely increase significantly.  We will also  check for additional blood tests for B12 deficiency.      Kofi A. Gerilyn Pilgrim, M.D.  Electronically Signed     KAD/MEDQ  D:  05/25/2008  T:  05/25/2008  Job:  [id number]

## 2011-04-17 NOTE — Group Therapy Note (Signed)
[name]               ACCOUNT NO.:  [id number]   MEDICAL RECORD NO.:  [id number]          PATIENT TYPE:  INP   LOCATION:  A325                          FACILITY:  [facility]   PHYSICIAN:  Osvaldo Shipper, MD     DATE OF BIRTH:  05-23-1935   DATE OF PROCEDURE:  05/25/2008  DATE OF DISCHARGE:                                 PROGRESS NOTE   SUBJECTIVE:  The patient still has significant pain in his bilateral  shoulders.  He says the right shoulder feels better than the left.  He  is complaining of 10/10 pain at this time.   OBJECTIVE:  VITAL SIGNS:  He is afebrile.  His heart rate in the 90s,  respiratory rate 20, blood pressure 102/59, saturation 98% on room air.  CBGs 103, 257, 283.  LUNGS:  Clear to auscultation bilaterally.  CARDIOVASCULAR:  S1, S2 normal, regular.  No murmurs appreciated.  ABDOMEN:  Soft, nontender.  EXTREMITIES:  Lower extremity did not show any edema.  Bilateral shoulders:  He is able to abduct his right shoulder much more  than he was able to do yesterday.  He almost brings it up to about 45  degrees.  The left shoulder again very limited active mobility,  passively again I am unable to abduct it much because of pain.   LABORATORY DATA:  No labs were drawn today.   IMAGING STUDIES:  He had a shoulder x-ray yesterday of the left shoulder  which showed degenerative changes of the acromioclavicular joint and the  rotator cuff insertion.  No acute findings were noted.  He had an MRI of  the C-spine which showed stenotic lesions, but no cord compression.  He  also had an MRI of the brain which did not show any acute intracranial  abnormality.  Chronic small vessel disease which was mild in intensity  was noted.   ASSESSMENT/PLAN:  1. Bilateral arm pain and weakness.  I think this is still has a      result of his cervical disease.  Neurology saw him and they agree.      Neurology thinks he may benefit from high dose Solu-Medrol for a      couple of days,  so they have initiated that.  We will also get      orthopedic opinion for this patient.  We are will also utilize pain      control.  Currently he is on oxycodone q. 4 p.r.n.  We will give      him some IV Dilaudid small dose for now.  2. Insulin-dependent diabetes.  Control has improved.  However, since      he is going to be on steroids now, I am anticipating blood sugars      will again elevate quite significantly.  We will have to either      further titrate his insulin or we may have to put him on insulin      drip depending on how that his blood sugars elevate.  3. History of falls as a result of lower extremity weakness.  MRI  of      the brain did not show any significant abnormalities.  He is at      risk for frequent falls, so he will require a skilled nursing      facility placement at this time.  4. Acute renal failure resolved.  5. Atrial fibrillation, stable.  Coumadin has been discontinued      because of his falls as the risk outweighs the benefit at this      time.  He will need to follow-up with Dr. Alanda Amass as an      outpatient to determine if coumadin can be initiated at a later      date.  6. Coronary artery disease status post CABG, stable.  7. He has peripheral vascular disease, hypertension, depression, all      which is stable.  He also has prostate cancer for which he was on a      study drug which he is not getting during this hospital stay.  This      issue will need to be resolved when he leaves [facility].   He is agreeable to placement when his medical issues have improved.  So  he will need pulse high-dose steroids for 3 days, orthopedic  consultation, pain control, adjustment of his Lantus and then he still  has at least 2-3 days more before he can be sent to the nursing home.      Osvaldo Shipper, MD  Electronically Signed     GK/MEDQ  D:  05/25/2008  T:  05/25/2008  Job:  [id number]   cc:   Darleen Crocker A. Gerilyn Pilgrim, M.D.  Fax: [phone]

## 2011-04-17 NOTE — Consult Note (Signed)
NAMELADONTE, VERSTRAETE               ACCOUNT NO.:  [id number]   MEDICAL RECORD NO.:  [id number]          PATIENT TYPE:  OBV   LOCATION:  2550                         FACILITY:  [facility]   PHYSICIAN:  Newman Pies, MD            DATE OF BIRTH:  07-Jan-1935   DATE OF CONSULTATION:  04/02/2009  DATE OF DISCHARGE:  04/02/2009                                 CONSULTATION   CHIEF COMPLAINT:  Foreign body in the pharynx.   HISTORY OF PRESENT ILLNESS:  The patient is a 76 year old male who  initially presented to the [facility] Emergency Room, complaining of  possible foreign body aspiration.  According to the patient, he was  trying to clean the [facility] by putting it in his mouth.  His great-  granddaughter jump on his back, and he aspirated the coin.  He could  feel the foreign body in his left neck.  A plain film x-ray performed at  [facility] Emergency Room shows the foreign body within his left  piriform sinus.  The patient denies any shortness of breath, wheezing,  neck pain, or bleeding.  He is in no distress.   PAST MEDICAL HISTORY:  1. Hypertension.  2. Diabetes.  3. Hypercholesterolemia.  4. Myocardial infarction.  5. Prostate cancer.  6. Renal failure.   PAST SURGICAL HISTORY:  Coronary artery bypass graft surgery, stent  placement.   DRUG ALLERGY:  No known drug allergy.   HOME MEDICATIONS:  Metolazone, Niaspan, Altace, omeprazole, Lasix,  Plavix, Vytorin.   SOCIAL HISTORY:  The patient denies any drug abuse.  He is a former  smoker.  Occasional drinker.  The patient lives alone and is retired.  The patient currently lives in [place], [place].   REVIEW OF SYSTEMS:  As above, otherwise, negative.   PHYSICAL EXAMINATION:  VITAL SIGNS:  Temperature 97.8, blood pressure  158/74, pulse 93, respiration 24.  GENERAL:  The patient is a well-nourished and well-developed 76 year old  male in no acute distress.  He is alert and oriented x3.  HEENT:  His pupils are equal, round, reactive  to light.  Extraocular  motion is intact.  Examination of the ears shows normal auricles and  external auditory canals.  Nasal examination shows normal nasal dorsum,  nasal mucosa, septum, and turbinates.  Oral cavity examination shows  normal lips, gums, tongue, oral cavity, and oropharyngeal mucosa.  No  mass or lesion is noted.  NECK:  Palpation of the neck reveals no lymphadenopathy or mass.  The  trachea is midline.  The thyroid is not significantly enlarged.  LUNGS:  Auscultation shows no wheezing, crackles, or rales.   IMPRESSION:  Pharyngeal foreign body.  The coin is likely stuck within  the left piriform sinus.   RECOMMENDATIONS:  1. Direct laryngoscopy and foreign body removal under general      anesthesia.  2. The patient may be discharged after the foreign body removal.      Newman Pies, MD  Electronically Signed     ST/MEDQ  D:  04/02/2009  T:  04/03/2009  Job:  [id number] 

## 2011-04-17 NOTE — Consult Note (Signed)
NAMETHIMOTHY, Bruce               ACCOUNT NO.:  [id number]   MEDICAL RECORD NO.:  [id number]          PATIENT TYPE:  INP   LOCATION:  6737                         FACILITY:  [facility]   PHYSICIAN:  James L. Randa Evens, M.D. DATE OF BIRTH:  1935/06/15   DATE OF CONSULTATION:  11/07/2007  DATE OF DISCHARGE:                                 CONSULTATION   REASON FOR CONSULTATION:  We were asked to see [name] today in  consultation for a GI bleed by [facility].   HISTORY OF PRESENT ILLNESS:  This is a 76 year old gentleman who had an  episode of bright red blood per rectum on approximately October 03, 2007  which resolved after a few days.  He did not think much of it because he  has had a history of hemorrhoids that will bleed and then resolve.  At  that time, he was also having epistaxis, however.  The patient reports a  long-term history of heartburn that has been better in the past 2 weeks.  He denies any NSAID use.  He takes only Tylenol for pain.  He says that  his stools are normally every day, but lately have been firm and  slightly darker than usual.  However, he denies any obvious melena.  Recently, the patient reports that he has been told frequently he is  pale.  He has felt short of breath and weak.  He was found to have a  hemoglobin of 6.1 on November 06, 2007 in Dr. Cassandria Santee and was sent to  the emergency room.  The patient also reports that his INR has  vacillated widely over the last year.   PRIMARY CARE PHYSICIAN:  His care providers include a primary care  physician, Dr. Phillips Odor.   CARDIOLOGIST:  Dani Gobble, MD.   UROLOGISTLucrezia Starch. Earlene Plater, M.D.   NEUROSURGEON:  Hilda Lias, M.D.   PAST MEDICAL HISTORY:  1. Significant for MI x 3, status post stent placement, status post      coronary artery bypass grafting.  2. He is a type 2 diabetic.  3. Hypertension.  4. Congestive heart failure.  5. Peripheral vascular disease.  6. Proximal AFib for which he  is on chronic Coumadin.  7. He has had a history of prostate cancer and is currently in a      research trial for medication.  He sees Dr. Earlene Plater for this.  8. Chronic kidney disease.  9. History of depression.  10.Hypothyroidism.  11.His most recent colonoscopy was done at the [place] in October 2008.      The colon was negative with the exception of 2 polyps which were      found to be benign per his history.   MEDICATIONS:  1. Metronidazole.  2. Niaspan.  3. Altace.  4. Aspirin.  5. Omeprazole which he takes p.r.n.  6. Lasix.  7. Coumadin.  8. Vytorin.   ALLERGIES:  1. CONTRAST MEDIA.  2. IODINE.   REVIEW OF SYSTEMS:  Significant for neck pain which is why he was having  his first visit with Dr. Jeral Fruit  yesterday.  I also mentioned that he is  in a research trial for prostate cancer medication.   SOCIAL HISTORY:  Positive for 2 beers a day.  He used to be a much  heavier drinker, but has cut back.  He is also an ex-smoker.   PHYSICAL EXAMINATION:  GENERAL:  He is alert and oriented in no apparent  distress.  VITAL SIGNS:  Temperature is 98.5, pulse is 64, respirations are 18,  blood pressure is 116/59.  CARDIOVASCULAR:  Irregular rhythm, but no murmurs, rubs or gallops were  appreciated.  LUNGS:  Decreased breath sounds.  He has no wheezes or crackles though.  ABDOMEN:  Soft, nontender, mildly distended.  He seems to guard when I  palpate him with decreased bowel sounds.   LABORATORY DATA:  He had a hemoglobin of 6.1 yesterday on admission with  an MCV value of 87.  His INR was 3.9.  Today after transfusion, he has a  hemoglobin of 8.4, hematocrit 25.8, white count 9.5, platelets 273,000.  BUN of 53, creatinine 1.8, glucose 216, triglycerides were 198.  BNP is  538, PTT is 32, PT is 28, INR is 2.5.   ASSESSMENT:  Dr. Carman Ching has seen and examined the patient,  collected a history and reviewed his chart.  His impression is that this  is a 76 year old gentleman with  multiple medical problems including a  substantial heart history and diabetes.  His current anemia is likely  caused by a slow upper gastrointestinal bleed which was assisted by a  supratherapeutic INR.  We will plan for an upper endoscopy this  afternoon.  Thanks very much for this consultation.      Stephani Police, PA    ______________________________  Llana Aliment Randa Evens, M.D.    MLY/MEDQ  D:  11/07/2007  T:  11/07/2007  Job:  [id number]   cc:   Dani Gobble, MD  Lucrezia Starch. Earlene Plater, M.D.  Hilda Lias, M.D.  [name]., M.D.  Phillips Odor, MD

## 2011-04-17 NOTE — Group Therapy Note (Signed)
NAMECHARLIE, [name]               ACCOUNT NO.:  [id number]   MEDICAL RECORD NO.:  [id number]          PATIENT TYPE:  INP   LOCATION:  A325                          FACILITY:  [facility]   PHYSICIAN:  Dorris Singh, DO    DATE OF BIRTH:  [birth year]   DATE OF PROCEDURE:  05/26/2008  DATE OF DISCHARGE:                                 PROGRESS NOTE   Patient is a 76 year old Caucasian male who was seen today.  Currently,  we are awaiting placement for him.  He has had acute bilateral arm  weakness which has been improved.  Also, patient is being followed by  Dr. Gerilyn Pilgrim.  He was started on high dose IV steroids for three days,  this is day #2.  Patient is stating a considerable improvement in his  movement of his upper extremities as well as a steroid shot in his  elbow, states that he is feeling much better.  Currently, he will be  done with his steroid treatment tomorrow and anticipate placement at  that point in time.   PHYSICAL EXAMINATION:  VITAL SIGNS:  His current vitals are as follows.  Temperature 97.6, pulse 79, respirations 20, blood pressure 151/74.  GENERAL APPEARANCE:  Patient is a 76 year old Caucasian male who is well-  developed, well-nourished and in no acute distress.  He answers  questions appropriately.  HEART:  Regular rate and rhythm.  LUNGS:  Clear to auscultation bilaterally.  ABDOMEN:  Soft, nontender, nondistended.  EXTREMITIES:  Positive pulses, no edema, ecchymosis or cyanosis.  UPPER EXTREMITIES:  He has increased range of motion.  He is able to  abduct his arm and also laterally cross it as well.  He states it is  much improved from yesterday.   LABORATORY DATA:  No labs for today.   ASSESSMENT/PLAN:  1. Bilateral arm pain and weakness.  This is improved.  We will      continue the Solu-Medrol IV for one more days.  2. Insulin dependent diabetes.  We have been monitoring this and      keeping it under control.  Lantus was increased and this seems to    have improved patient's prognosis.  3. Also a history of falls.  Due to this, he will be going to an acute      nursing facility.  4. Acute renal failure, resolved.  5. Atrial fibrillation is stable.   Currently we are awaiting for placement and we will discharge him at  that point in time.      Dorris Singh, DO  Electronically Signed    CB/MEDQ  D:  05/26/2008  T:  05/26/2008  Job:  [id number]

## 2011-04-17 NOTE — Op Note (Signed)
[name], [name]               ACCOUNT NO.:  [id number]   MEDICAL RECORD NO.:  [id number]          PATIENT TYPE:  INP   LOCATION:  6737                         FACILITY:  [facility]   PHYSICIAN:  Shirley Friar, MDDATE OF BIRTH:  10-26-1935   DATE OF PROCEDURE:  11/10/2007  DATE OF DISCHARGE:                               OPERATIVE REPORT   PROCEDURE:  Colonoscopy.   INDICATION:  Anemia, rectal bleeding.   MEDICATIONS:  Fentanyl 125 mcg IV, Versed 12.5 mg IV.   FINDINGS:  Rectal exam was normal.  A pediatric Pentax colonoscope was  inserted into a fair prepped colon, advanced to the cecum, where  ileocecal valve and appendiceal orifice identified.  On careful  withdrawal of the colonoscope revealed diffuse left-sided  diverticulosis.  No visible bleeding was seen.  Retroflexion was  unremarkable.   ASSESSMENT:  1. Left-sided diverticulosis, otherwise normal colonoscopy.  2. Fair prep prevented visualization of small polyps.  3. No visible bleeding noted.   PLAN:  1. Start back on liquid diet and advance as tolerated.  2. If rebleeding recurs then may need capsule endoscopy.      Shirley Friar, MD  Electronically Signed     VCS/MEDQ  D:  11/10/2007  T:  11/10/2007  Job:  [id number]

## 2011-04-17 NOTE — Discharge Summary (Signed)
[name], [name]               ACCOUNT NO.:  [id number]   MEDICAL RECORD NO.:  [id number]          PATIENT TYPE:  INP   LOCATION:  A325                          FACILITY:  [facility]   PHYSICIAN:  Dorris Singh, DO    DATE OF BIRTH:  [birth year]   DATE OF ADMISSION:  05/20/2008  DATE OF DISCHARGE:  06/25/2009LH                               DISCHARGE SUMMARY   ADMISSION DIAGNOSES:  1. Uncontrolled diabetes.  2. Medical noncompliance.  3. Acute renal failure.  4. History of coronary artery disease.  5. Atrial fibrillation.  6. Hypertension with lipidemia.  7. Abnormal liver function tests.   DISCHARGE DIAGNOSES:  1. Type 2 diabetes, poorly controlled.  2. Upper extremity weakness that has resolved.  3. History of falls.  4. Acute renal failure, which is resolved.  5. Atrial fibrillation.  6. Coronary artery disease.  7. Peripheral vascular disease.  8. Depression.  9. Hypertension.   PRIMARY CARE PHYSICIAN:  Dr. Phillips Odor.   LABS:  He had a CT of the head on June 19th which demonstrated atrophy  and minimal chronic white matter disease changes, no acute intracranial  abnormalities.  He had a shoulder on June 22 of the left shoulder which  showed degenerative diseases of the [facility] joint and rotator cuff insertion  as described and acute findings in the left shoulder, widening of the [facility]  joint suspicious for mild distal clavicle resorption.  This is  nonspecific but can be seen with prior trauma or renal disease.  On June  22 he had an MRI of the brain without contrast which showed no acute  intracranial abnormality, mild chronic small vessel disease for age, and  mild ethmoid air cell inflammatory changes.  Also had an MRI of the C-  spine which demonstrated stable multilevel multifactorial spinal and  neural foraminal stenosis worse at C5-C6 and C3-C4 as detailed above,  and 2 new increased C5 inferior end plate Schmorl's nodules suspected.  No strong evidence of osseous  metastatic disease.  Nuclear medicine bone  scan might be more sensitive in light of inability to administer  contrast with this exam.  On June 23 he had a 2-view chest x-ray which  demonstrated cardiac enlargement status post CABG, minimal chronic  bronchitic changes.  On June 23, he also had a 4-view of the knee, which  showed bony demyelinization, small knee joint effusion without definite  acute bony abnormality, and on June 23 he had an elbow complete which  showed elbow joint effusion with question fracture fragment versus old  ossicle at lateral joint line, recommend clinical correlation for pain  if clinically indicated.  The right a level may be assessed by a CT or  MRI.   HOSPITAL COURSE H&P:  Was done by Dr. Rito Ehrlich and he was admitted for  the above diagnoses.   CONSULTS THAT WERE MADE:  Include Dr. Hilda Lias of orthopedic surgery and  Dr. Beryle Beams of neurology.  Physical therapy as well.   COURSE:  He was admitted to the service of [facility] for hyperglycemia.  He had a blood sugar initially ranging,  his blood glucose was 1098.  He  was started on IV fluids and he was started on Lantus as well.  Acute  renal failure, he was taken off of his diuretics including Lasix since  he was dehydrated and not taking Lasix as mentioned, so he was taken off  of that at that point in time, and history of coronary artery disease.  It was determined he had a lot of PVCs and he was on a beta blocker  which was continued.  Atrial fib, this patient was on Coumadin but due  to his initial presentation of a fall and his admitting that he had had  a fall, it was determined that he should be off of his Coumadin because  he was at an increased risk of bleed.  Hypertension and dyslipidemia  which was stable and abnormal LFTs.  This would be followed via labs.  The patient's course is as follows.  The patient had the above testing  done on the appropriate dates, which showed that he did not  have an  intracranial bleed.  He was also followed by the diabetes treatment  program, who gave recommendations of 80 units of 70/30 at breakfast with  10 units at lunch and 70 units at supper.  The patient's renal failure  continued to improve.  His BUN and creatinine continued to trend down.  Physical therapy continued to see the patient.  His blood sugars  actually became better.  He started to complain of some bilateral  weakness and arm pain at that point in time.  Social services came on  the case to assess whether or not he could go home because he lives  alone.  It was determined that he should go to a skilled nursing unit  because of the recent falls and increased weakness.  The patient agreed.  Also he was seen by Dr. Hilda Lias who did an aspiration of his elbow,  which showed no fluid.  He was also seen by Dr. Gerilyn Pilgrim for mental  status changes as well.  It was determined, because of his bilateral arm  pain, testing was done on his C-spine and found that he had severe  cervical disease.  Neurology thought that at least he could benefit from  Solu-Medrol for a couple of days.  However, it was problematic due to  his increased blood sugars, so it was determined that we would go ahead  and proceed with that and cover him with sliding scale insulin.  The  patient stated understanding and we continued to do that for the next 3  days.  His acute renal failure had resolved and his atrial fibrillation  was stable.  The patient continued to do well over the next 3 days with  steroid treatment.  His range of motion increased, as well as his  weakness improved.  It was determined on the third day, which was June  25, that the patient could be discharged to the skilled nursing  facility.  He continued to do well without any other incidents, and we  have been covering his blood sugars.   The medications that he will go with to the skilled nursing facility  include:  1. Coreg 12.5 b.i.d.  with meals.  2. Pletal 50 mg b.i.d.  3. Lexapro 10 mg p.o. daily.  4. Sliding scale insulin, moderate sliding scale.  5. Vytorin 10/80 mg p.o. nightly.  6. Currently, the patient is on Lantus 80 mg b.i.d. this while he is  on high dose steroids.  We will recommend that he go to 60 units      b.i.d.  However, this will need to be monitored while he is at the      nursing home and may need to adjust this within the next 1 to 2      days.  7. Nu-Iron 150 mg p.o. daily.  8. Niacin 1000 mg p.o. daily.  9. Omeprazole 20 mg p.o. daily.  10.Albuterol nebs 2.5 mg q.4 hours p.r.n.  11.Phenergan 12.5 mg p.o. t.i.d. p.r.n.  12.Aspirin 325 mg p.o. daily.  13.Darvocet-N 100 for pain 1 p.o. q.4 hours p.r.n.  14.Altace 2.5 mg p.o. daily.  15.The patient was on Lasix prior to admission to the hospital.  He      was on Lasix 80 mg p.o. b.i.d.  This will need to be addressed at      the nursing home to see if he still needs to be on Lasix, which was      one of the reasons why he went into acute renal failure.  16.He was also on metolazone 2.5 mg p.o. daily as well.  This was held      while he was here and, as mentioned before, we have taking the      patient off of his Coumadin, which he was on before.   One of the recommendations that dietary has made for him, which can be  addressed when he is at the nursing home regarding his diabetic  medication, is that they recommend 70/30 80 units at breakfast, 10 units  at lunch, and 70 units at supper.  While he is still experiencing the  effects of high dose steroids, they recommend that they keep him at his  current dose as we treated him here, and then once his sugar start to  normalize a little bit more, to possibly look at that as a guideline.  The patient's condition is stable and disposition will be to the nursing  home.      Dorris Singh, DO  Electronically Signed     CB/MEDQ  D:  05/27/2008  T:  05/27/2008  Job:  [id number]   cc:    Corrie Mckusick, M.D.  Fax: [phone]

## 2011-04-17 NOTE — Op Note (Signed)
NAMEAYHAM, WORD               ACCOUNT NO.:  [id number]   MEDICAL RECORD NO.:  [id number]          PATIENT TYPE:  AMB   LOCATION:  ENDO                         [facility]   PHYSICIAN:  Shirley Friar, MDDATE OF BIRTH:  June 29, 1935   DATE OF PROCEDURE:  DATE OF DISCHARGE:  11/19/2007                               OPERATIVE REPORT   PROCEDURE:  Capsule endoscopy.   INDICATIONS FOR PROCEDURE:  1. Obscured GI bleed.  2. Anemia.   FINDINGS:  First gastric image noted at 52 seconds.  First duodenal  image noted at 14 minutes 43 seconds, with a gastric passage time of 13  minutes.  First cecal image noted at 7 hours 22 minutes 15 seconds with  small bowel passage time of 7 hours 7 minutes.   One nonbleeding small bowel angiectasia noted.  One nodular mucosal fold  seen.  No source of the anemia identified.   RECOMMENDATIONS:  1. Iron supplementation.  2. Hematology referral.      Shirley Friar, MD  Electronically Signed     VCS/MEDQ  D:  11/20/2007  T:  11/21/2007  Job:  [id number]

## 2011-04-17 NOTE — H&P (Signed)
NAMEATHENS, LEBEAU               ACCOUNT NO.:  [id number]   MEDICAL RECORD NO.:  [id number]          PATIENT TYPE:  INP   LOCATION:  1826                         FACILITY:  [facility]   PHYSICIAN:  Mobolaji B. Bakare, M.D.DATE OF BIRTH:  [birth year]   DATE OF ADMISSION:  11/06/2007  DATE OF DISCHARGE:                              HISTORY & PHYSICAL   PRIORITY ADMISSION HISTORY AND PHYSICAL   PRIMARY CARE PHYSICIAN:  Dr. Phillips Odor.   CARDIOLOGIST:  Dani Gobble, MD, with [facility].   NEUROSURGEON:  Hilda Lias, MD.   CHIEF COMPLAINT:  Anemia.   HISTORY OF PRESENTING COMPLAINT:  Mr. Province is a pleasant, 76 year old,  Caucasian male, who has history of paroxysmal atrial fibrillation.  He  has been on anticoagulation for several years.  The patient first  noticed bright red blood per rectum for three to four days in early  November.  He did not think much of it because it subsided.  Additionally, the patient has been having intermittent nosebleeds for  two to three months.  Since mid November, he has noticed his stool was  black, and he has been getting progressively weak and tired.   He saw Dr. Phillips Odor in the office a few days ago and he had his INR  checked.  This was deemed to be elevated.  The patient cannot remember  the number, however, was told to stop Coumadin for two days, that is,  yesterday and today and to recheck his INR again tomorrow.  He was  referred to Dr. Jeral Fruit because of chronic neck pain and consideration  for surgery.  In Dr. Cassandria Santee office, the patient was noted to be pale;  hence, he was sent to the emergency room.  Upon evaluation in the  emergency room, his hemoglobin was 6.1 with hematocrit of 18.2, PT-INR  39.8/3.9.  The patient had a positive stool hemoccult.   There is no hematemesis, no epistaxis at this point, and there is no  hematochezia.  He has no abdominal pain, vomiting, although he feels  nauseous.   The patient tells  me he had a colonoscopy done at the [facility] in  October whereby there is noted two polyps.   REVIEW OF SYSTEMS:  Chronic neck pain for which he has been evaluated  for possible surgery.  No chest pain, shortness of breath.  He denies  dyspnea on exertion.   PAST MEDICAL HISTORY:  1. Myocardial infarction x3 times.  The patient cannot recall dates.      He is status post stent placement.  2. Hypercholesterolemia.  3. Diabetes mellitus.  4. Hypertension.  5. Prostate cancer for which he receives treatment from Dr. Earlene Plater.  6. Chronic kidney disease.  7. Chronic congestive heart failure.  8. Peripheral vascular disease.  9. Right heart dysfunction.  10.Paroxysmal atrial fibrillation.  11.Depression.  12.Hypothyroidism.   PAST SURGICAL HISTORY:  CABG six to eight years ago.   CURRENT MEDICATIONS:  1. Metronidazole 2.5 mg once a day.  2. Niaspan 1000 mg daily.  3. Altace 2.5 mg daily.  4. Aspirin 81 mg  daily.  5. Omeprazole 20 mg daily.  6. Lasix 80 mg b.i.d.  7. Warfarin 5 mg daily.  8. Vytorin 10/80 once daily.   ALLERGIES:  No known drug allergies.   FAMILY HISTORY:  Both parents are deceased.  Father passed away from  heart trouble.  Mother passed away from brain cancer.   SOCIAL HISTORY:  The patient is retired.  He lives independently.  He is  fairly independent in activities of daily living.  He is an ex-smoker.  He drinks one beer per day and occasionally takes hard liquor.  The  patient does not get withdrawal symptoms if he does not drink.   PHYSICAL EXAMINATION:  INITIAL VITAL SIGNS:  Temperature 97.1, blood  pressure 111/23, pulse of 100, respiratory rate of 24, O2 SATs of 97% on  room air.  GENERAL:  The patient is awake, alert, oriented in time, place, and  person.  HEENT:  Normocephalic, atraumatic head.  He is clinically pale, not  jaundiced.  Mucous membranes moist, no oral thrush.  There is no  apparent bleeding from the nose at this point.  NECK:   No elevated JVD, no carotid bruit.  LUNGS:  Clear clinically to auscultation.  CVS:  S1 and S2, irregular.  ABDOMEN:  Not distended, soft and nontender, bowel sounds present.  EXTREMITIES:  No pedal edema or calf tenderness, no cyanosis.  CNS:  No focal neurological deficit.   INITIAL LABORATORY DATA:  PT 39.8, INR 3.9.  White cell 10.6, hemoglobin  6.1, hematocrit 18.2, platelets 306.  Sodium 133, potassium 3.3,  chloride 98, CO2 24, creatinine 2.03, glucose 185, alkaline-phosphatase  47, total bilirubin 1.1, AST 20, ALT 12, total protein 5.7, albumin 2.9,  calcium 8.5.   EKG unavailable at this point.   ASSESSMENT AND PLAN:  Mr. Hussar is a 76 year old, Caucasian male, who  has history of paroxysmal atrial fibrillation and coronary artery  disease.  He has been on Coumadin and aspirin.  He is presenting with  severe anemia, profound, and symptoms suggestive of upper GI loss and  probably related to recurrent nosebleeds.  He will be admitted for  further evaluation.   ADMISSION DIAGNOSES:  1. Gastrointestinal bleed, likely upper gastrointestinal versus      recurrent nosebleeds in the setting of supratherapeutic INR:  Will      hold Coumadin and aspirin, give Vitamin K 5 mg p.o. x1, will      transfuse with three units of packed red blood cells, and will give      Lasix 40 mg in between each unit.  Hemoglobin and hematocrit will      be checked q.8h for 24 hours then daily CBC, will check anemia      panel, Protonix 40 mg intravenously daily, will consult      Gastroenterology in the morning.  2. Acute blood loss anemia secondary to gastrointestinal      bleed/nosebleed:  Transfuse as mentioned above.  3. History of coronary artery disease status post coronary artery      bypass grafting:  The patient has no angina symptoms at this time.      Will check electrocardiogram.  4. Diabetes mellitus:  The patient is not on any oral hypoglycemic      agents or insulin.  I believe he is  on diet control.  Will check a      hemoglobin A1c and place on sliding scale insulin.  5. Hypertension:  I am holding all antihypertensives at  this point      given an early, relatively borderline, blood pressure.      Consideration will be given to restarting antihypertensive when      appropriate.  6. History of chronic kidney disease:  Baseline BUN and creatinine is      unknown.  Will obtain records from Dr. Lamar Blinks office and hold      Altace and metronidazole and check BMET again in the morning.  7. History of paroxysmal atrial fibrillation:  Rate is controlled.  8. History of congestive heart failure:  Will check a BNP and chest x-      ray as baseline.  Will consider starting diuretics soon.  9. Hypokalemia:  Will give 40 mEq of potassium chloride.  10.Mild hyponatremia:  The patient is asymptomatic.      Mobolaji B. Corky Downs, M.D.  Electronically Signed     MBB/MEDQ  D:  11/06/2007  T:  11/06/2007  Job:  [id number]   cc:   Dr. Phillips Odor (which one)??  Hilda Lias, M.D.  Dani Gobble, MD

## 2011-04-17 NOTE — Group Therapy Note (Signed)
[name], [name]               ACCOUNT NO.:  [id number]   MEDICAL RECORD NO.:  [id number]          PATIENT TYPE:  INP   LOCATION:  A325                          FACILITY:  [facility]   PHYSICIAN:  Kofi A. Gerilyn Pilgrim, M.D. DATE OF BIRTH:  20-May-1935   DATE OF PROCEDURE:  05/26/2008  DATE OF DISCHARGE:                                 PROGRESS NOTE   The patient reports improved movements of the upper extremities.  He has  got one dose of high-dose steroids and also has gotten probably elbow  injection involving the right side.   Temperature 97.7, pulse 102, respirations 20, and blood pressure 141/82.  He is awake and alert.  He does have reduced hearing bilaterally.  He  has initially improved strength and range of motion involving the right  upper extremity.  The left proximal is 3/5.   ASSESSMENT AND PLAN:  Upper extremity weakness, multifactorial,  including degenerative disk disease and bilateral facet hypertrophy  causing radiculopathy.  He also has musculoskeletal problems.  As he has  fall, I did discuss the case with the patient's daughter by phone per  the patient's request.  We will continue with the high-dose of steroids  for the next 2 days.  He will need to address the cervical disc disease  and may need to have surgery later.      Kofi A. Gerilyn Pilgrim, M.D.  Electronically Signed     KAD/MEDQ  D:  05/26/2008  T:  05/27/2008  Job:  [id number]

## 2011-04-17 NOTE — H&P (Signed)
[name], [name]               ACCOUNT NO.:  [id number]   MEDICAL RECORD NO.:  [id number]          PATIENT TYPE:  EMS   LOCATION:  ED                            FACILITY:  [facility]   PHYSICIAN:  Osvaldo Shipper, MD     DATE OF BIRTH:  1935/07/31   DATE OF ADMISSION:  05/20/2008  DATE OF DISCHARGE:  LH                              HISTORY & PHYSICAL   PMD:  Dr. Phillips Odor.   CARDIOLOGIST:  Dani Gobble, M.D. with [facility].   ADMISSION DIAGNOSES:  1. Hyperglycemia secondary to noncompliance.  2. Acute renal failure.  3. Possible gastroenteritis.  4. History of coronary artery disease, status post coronary artery      bypass graft.  5. History of prostate cancer.   CHIEF COMPLAINT:  High blood sugar.   HISTORY OF PRESENT ILLNESS:  Patient is a 76 year old Caucasian male who  was in his usual state of health until Saturday when he started feeling  nauseous.  This was followed by dry heaves and then followed by a few  episodes of emesis.  He also admits to having diarrhea for the last four  days.  He has had numerous bowel movements, watery in color.  He denies  any recent antibiotic use.  He denies any fever, chills, abdominal pain.  No sick contacts.  No recent travel outside this area.  He says he has  been taking all of his other medications on a regular basis, except for  the insulin, which he did not take because he was not eating anything in  the last few days.  He also has severe hearing loss, and his left side  is the better hearing site.  He said he was also supposed to get an  ultrasound of his liver for some liver abnormalities that had been noted  recently by his primary medical doctor.  Patient tells me that his blood  sugar is usually very poorly controlled, fluctuating from 70s to 400s.  He has been on insulin 70/30 for the last five years, but he says that  his sugars have not been well controlled.  His HB A1C when last checked  was 7.1 in December,  2008, which appears to be quite a reasonable level  of HB A1C for this gentleman.   HOME MEDICATIONS:  1. Niaspan 1000 mg once daily.  2. Altace 2.5 mg once daily.  3. Lasix 80 mg twice daily.  4. Warfarin 5 mg once daily.  5. Vytorin 10/80 once daily.  6. Iron pills once daily.  7. Insulin 70/30 80 units in the morning, 10 units in the afternoon,      80 units at bedtime.  8. Humalog on a sliding scale.  9. Aciphex 20 mg a day.  10.Coreg 25 mg b.i.d.  11.Lexapro 10 mg daily.  12.Percocet as needed.  13.Cilostazol daily.  14.He is also on a research medication for prostate cancer, which has      been prescribed to him by Dr. Earlene Plater in [place].   ALLERGIES:  No known drug allergies.   PAST MEDICAL HISTORY:  1.  He has a history of anemia.  He actually had some GI bleeding a few      months ago.  He has been extensively evaluated for this with EGD,      colonoscopy, and a capsule study, and no clear etiology was found.  2. He has a history of coronary artery disease, status post MI.  He      has a history of CABG that was done about seven years ago.  3. History of insulin-dependent diabetes for the last 40 years.  4. Hypertension.  5. Prostate cancer.  6. His EF is known to be about 35%, based on a measurement that was      done with a cardiac cath at the time of his CABG.  I do not have      any current data.  7. Other surgical history includes appendectomy and hemorrhoidectomy.  8. He also has hearing loss.   SOCIAL HISTORY:  He lives alone in [place].  Today he is accompanied by  his friend, who he requested to stay in the room while I was asking him  questions.  He does have a daughter and granddaughter who live close to  him.  No smoking.  He quit six years ago at the time of his CABG.  Occasional alcohol use, not on a regular basis.   FAMILY HISTORY:  Leukemia, back surgeries present.   REVIEW OF SYSTEMS:  GENERAL:  Positive for weakness.  He is feeling  thirsty.   No dizziness or lightheadedness.  HEENT:  Unremarkable.  CARDIOVASCULAR:  Unremarkable.  He admits to having premature beats quite frequently.  RESPIRATORY:  Unremarkable.  GI:  As in HPI.  GU:  Unremarkable.  He has been passing urine.  NEUROLOGIC:  Unremarkable.  PSYCHIATRIC:  Unremarkable.  DERMATOLOGICAL:  Unremarkable.  Other systems unremarkable.   PHYSICAL EXAMINATION:  VITAL SIGNS:  His temperature is 96.8, blood  pressure 96/54, currently 107/60s, heart rate 80s, respiratory rate 20,  saturation 98% on room air.  GENERAL:  An elderly white male in no distress.  Well-developed and well-  nourished.  HEENT:  There is no pallor, no icterus.  Oral mucous membranes are very  dry.  No oral lesions are noted.  NECK:  Soft and supple.  No thyromegaly is appreciated.  LUNGS:  Clear to auscultation bilaterally.  CARDIOVASCULAR:  S1 and S2 is normal.  Regular with many premature beats  that are heard.  No murmurs appreciated.  ABDOMEN:  Soft, nontender, nondistended.  Bowel sounds are present.  No  mass or organomegaly is appreciated.  EXTREMITIES:  No edema.  Peripheral pulses are palpable.  NEUROLOGIC:  He is alert and oriented x3.  He has hearing deficits but  no other focal neurological deficits.   LAB DATA:  His ABG shows a pH of 7.45, pCO2 33.  His white count is  13,000, hemoglobin 12.7, platelet count 208.  Sodium 120, glucose 1098.  The hyperglycemia is corrected.  Sodium 136, chloride 78, bicarb 26, BUN  87, creatinine 3.1, it is normal.  He usually has normal renal function  at baseline.  Ammonia was 16, calcium 9.2.  UA is negative for  infection.   No imaging studies have been done.   ASSESSMENT/PLAN:  This is a 76 year old Caucasian male who had what  sounds like symptoms of gastroenteritis for the last few days, who did  not take his insulin because he was not eating anything, who now  presents with hyperglycemia.  1. Hyperglycemia secondary to noncompliance:  I  have explained to him      that even if he is not taking anything orally, he still probably      needs some insulin, maybe at a lower dose.  In any case, we will      put him on glucomander with IV insulin.  Give him IV fluids for his      dehydration.  He tells me that his blood sugar is not controlled      with insulin 70/30.  We will check an HB A1C and then proceed from      there.  He might benefit from Lantus insulin, which usually does      not cause too much fluctuation in blood sugars.  Electrolytes will      be monitored.  2. Acute renal failure, probably as a result of the diarrhea,      vomiting, and the fact that he has been continuing to take his      Lasix during the last few days along with poor p.o. intake.  IV      fluids should hopefully correct his renal function.  3. History of coronary artery disease, status post coronary artery      bypass graft, stable.  He has a lot of premature ventricular      contractions on his telemetry.  His EKG is showing quite a bit of      premature ventricular contractions.  He tells me that this usually      happens.  He is on a beta blocker, which I will continue for now.  4. Atrial fibrillation is also present in this gentleman.  He is on      Coumadin.  We will check a STAT PT/PTT to insure that his INR is in      the therapeutic range.  5. Hypertension with lipidemia, stable.  He also has peripheral      vascular disease.  He has a history of prostate cancer, for which      he is on a research protocol.  Obviously, we will hold off on that      medication.  6. Possible abnormal liver function tests:  Probably why he was      getting an ultrasound.  We will go ahead and check his LFTs here.      If they are abnormal, I will get an ultrasound, otherwise I will      defer evaluation, etc., to his PMD.   He is a full code.   Further management decisions will depend on the results of further  testing and the patient's response to  treatment.      Osvaldo Shipper, MD  Electronically Signed     GK/MEDQ  D:  05/20/2008  T:  05/20/2008  Job:  [id number]   cc:   Corrie Mckusick, M.D.  Fax: [phone]

## 2011-04-20 NOTE — Procedures (Signed)
[place]. [facility]  Patient:    [name], [name] Visit Number: [id number] MRN: [id number]          Service Type: CAT Location: 2300 2305 01 Attending Physician:  Tressie Stalker Dictated by:   Shela Commons. Claybon Jabs, M.D. Proc. Date: 05/07/02 Admit Date:  05/06/2002                             Procedure Report  PROCEDURES:  Transesophageal echocardiogram.  DIAGNOSIS:  Coronary artery disease.  INDICATIONS:  [name] is a 76 year old white male who presents to the operating room for coronary artery bypass grafting.  He has a history of severe coronary artery disease and depressed left ventricular function, so Clarence H. Cornelius Moras, M.D., requested transesophageal echocardiogram for the intraoperative management of this patient.  DESCRIPTION OF PROCEDURE:  Following routine cardiac induction, the transesophageal probe was lubricated and carefully inserted into the patients esophagus for cardiac imaging.  Overall images of the heart showed no evidence of effusion or masses and the right atrium appeared normal in size.  There was no evidence of ASD by Doppler imaging and the tricuspid valve was normal in structure.  The valve had 1+ regurgitation with the pulmonary artery catheter crossing the valve.  The right ventricle appeared to be normal in size.  The left atrium also appeared to be generous with no evidence of masses or thrombus.  The mitral valve had 1+ mild regurgitation.  The pulmonary veins demonstrated normal pulmonary flow.  The left ventricle was mildly dilated with depressed left ventricular function, but no evidence of segmental wall motion abnormality.  The aortic valve was three leaflets and normal in structure without evidence of regurgitation or stenosis.  The patient underwent coronary artery bypass grafting and following this procedure the patient separated successfully from the bypass machine.  Interrogation of the heart showed  consistent to slightly improved left ventricular function. During the post bypass course, the mitral valve and aortic valve appeared to be unchanged.  The patients volume status was monitored with the TEE and overall there was some mild improvement immediately post surgery with the patients left ventricular function.  The surgery was concluded.  The probe was carefully removed from the patient.  The patient was taken to the SICU in good condition. Dictated by:   Shela Commons. Claybon Jabs, M.D. Attending Physician:  Tressie Stalker DD:  05/07/02 TD:  05/09/02 Job: [id number]

## 2011-04-20 NOTE — Discharge Summary (Signed)
[place]. [facility]  Patient:    [name], [name] Visit Number: [id number] MRN: [id number]          Service Type: SUR Location: 2000 2025 01 Attending Physician:  Tressie Stalker Dictated by:   Adair Patter, P.A.C. Admit Date:  05/06/2002 Discharge Date: 05/17/2002   CC:         [facility]  CVTS Office c/o Tressie Stalker, M.D.   Discharge Summary  ADDENDUM  Mr. Nyborg was originally deemed stable for discharge on May 12, 2002. However, before this date the patient developed pulmonary congestion and sternal wound drainage.  Because of this, the patient was placed on empiric antibiotics.  He received a several-day course of IV vancomycin and ceftazidime.  His drainage from his sternal incision ceased and there were no further signs of infection after course of IV antibiotics.  The patient was placed on oral antibiotics and subsequently deemed stable for discharge on May 17, 2002.  DISCHARGE MEDICATIONS:  1. Isosorbide mononitrate 30 mg p.o. q.d.  2. Amiodarone 200 mg two tablets daily.  3. Lopressor 50 mg 1/2 tablet every 12 hours.  4. Actos 45 mg one daily.  5. Precos 50 mg one tablet three times daily.  6. Aspirin 81 mg one daily.  7. Glyburide 6 mg one twice daily.  8. Glucophage 500 mg one twice daily.  9. Lantus Insulin 30 units every evening. 10. Pepcid 20 mg one tablet twice daily. 11. Zocor 20 mg one daily. 12. Keflex 500 mg one tablet three times daily for seven days. 13. Coumadin 5 mg.  The patients Coumadin dose will be adjusted on day of     discharge. Dictated by:   Adair Patter, P.A.C. Attending Physician:  Tressie Stalker DD:  05/16/02 TD:  05/18/02 Job: [id number]

## 2011-04-20 NOTE — Discharge Summary (Signed)
[place]. [facility]  Patient:    TANNER, YELEY Visit Number: [id number] MRN: [id number]          Service Type: SUR Location: 2000 2025 01 Attending Physician:  Tressie Stalker Dictated by:   Adair Patter, P.A. Admit Date:  05/06/2002 Disc. Date: 05/12/02   CC:         [facility]   Discharge Summary  ADMITTING DIAGNOSIS:  Coronary artery disease.  SECONDARY DIAGNOSES: 1. Postoperative atrial fibrillation. 2. Noninsulin-dependent diabetes mellitus. 3. Hypertension.  DISCHARGE DIAGNOSIS:  Coronary artery disease.  HOSPITAL COURSE:  Mr. Nazir was admitted to [facility] on May 06, 2002, at which time he underwent a cardiac catheterization which revealed significant coronary artery disease.  Because of this, Dr. Cornelius Moras was consulted. On May 07, 2002, Dr. Cornelius Moras performed a CABG x6 with the left internal mammary artery anastomosed to the left anterior descending artery, the left radial artery to the circumflex artery, a sequential saphenous vein graft to the posterior descending and posterolateral arteries, and a sequential saphenous vein graft to the first and second diagonal arteries.  No complications noted during the procedure.  Postoperatively the patients hospital course was complicated by atrial fibrillation.  The patient was rate-controlled with Lopressor.  Because of atrial fibrillation he was started on amiodarone and converted back to normal sinus rhythm.  He was anticoagulated with Coumadin secondary to his atrial fibrillation.  Adequate control of his diabetes was obtained by placing the patient back on his oral regimen, which included glyburide, Actos, and Precose.  He was also given Lantus insulin.  A final decision to sent him home on Lantus was not made until the time of discharge. The remainder of his hospital course was uneventful, and he was deemed stable for discharge on May 12, 2002.  DISCHARGE  MEDICATIONS:  1. Tylox one to two tablets every four to six hours as needed for pain.  2. Isosorbide mononitrate 30 mg one daily.  3. Amiodarone 200 mg two tablets twice daily.  4. Lopressor 50 mg one-half tablet every 12 hours.  5. Lasix 40 mg one daily for five days.  6. Potassium chloride 20 mEq one daily for five days.  7. Actos 45 mg one daily.  8. Precose 50 mg one tablet three times daily.  9. Aspirin 81 mg one daily. 10. Glyburide 6 mg one twice daily. 11. Glucophage 500 mg one twice daily. 12. The patient was instructed to resume his home Lipitor.  DISCHARGE INSTRUCTIONS:  Activity:  The patient was told no driving, strenuous activity, or lifting heavy objects.  He is told to walk daily and continue breathing exercises.  Discharge diet:  Low fat, low salt.  Wound care: The patient was told he could shower and clean his incision with soap and water.  DISPOSITION:  Home.  FOLLOW-UP:  The patient was told to call his cardiologist for a follow-up appointment.  He was also told to see Dr. Cornelius Moras on Monday, June 30, at 12:15 p.m.  The admitting nurse will draw his INR to follow his Coumadin, and he was told to go to the [facility] one hour before his appointment for a chest x-ray. Dictated by:   Adair Patter, P.A. Attending Physician:  Tressie Stalker DD:  05/11/02 TD:  05/13/02 Job: [id number]

## 2011-04-20 NOTE — Op Note (Signed)
[place]. [facility]  Patient:    [name], [name] Visit Number: [id number] MRN: [id number]          Service Type: CAT Location: 2300 2305 01 Attending Physician:  Tressie Stalker Dictated by:   Salvatore Decent. Cornelius Moras, M.D. Proc. Date: 05/06/02 Admit Date:  05/06/2002   CC:         Runell Gess, M.D.  Kem Boroughs, M.D.  Geanie Cooley, M.D.  [place]   Operative Report  REFERRING PHYSICIAN:  Runell Gess, M.D.  PRIMARY CARDIOLOGIST:  Kem Boroughs, M.D.  PRIMARY CARE PHYSICIAN:  Geanie Cooley, M.D.  REASON FOR CONSULTATION:  Severe three-vessel coronary artery disease with class III progressive angina and moderate left ventricular dysfunction.  HISTORY OF PRESENT ILLNESS:  The patient is a 76 year old retired white male from [place], [place], with no previous cardiac history but risk factors including history of type 2 diabetes mellitus, hyperlipidemia, longstanding tobacco abuse, and a family history of coronary artery disease. The patient apparently recently changed primary care physician and reports that for probably 10-15 years he has had symptoms of exertional chest discomfort described as a dull aching gas-like pressure across his left lower chest which is associated with shortness of breath and usually relieved promptly with rest. Recently, his symptoms have progressed somewhat in terms of frequency and severity. He was referred to Dr. Domingo Sep and underwent a stress Cardiolite exam on May 05, 2002, which was notable for findings of dilated left ventricular with resting ejection fraction 29% as well as inferior wall scar and mild anterolateral wall ischemia extending from the base towards the apex of the heart. The patient subsequently underwent elective cardiac catheterization today by Dr. Allyson Sabal. This demonstrates moderate stenosis of the left main coronary artery as well as severe three-vessel coronary artery disease and moderate to  severe left ventricular dysfunction. Cardiac surgical consultation is requested.  REVIEW OF SYSTEMS:  CARDIAC:  The patient describes exertional angina, as described. He reports no history of chest pain occurring at rest or waking him from his sleep. His symptoms are usually relieved within 5-10 minutes of rest. They have been increasing in frequency and severity recently. He denies any history of resting shortness of breath, but he has had progressive moderate exertional shortness of breath. He reports no problems with orthopnea, syncope, presyncopal symptoms, or PND. He does have bilateral lower extremity edema which is more problematic on the right than the left and has been associated with chronic venostasis disease. He reports occasional palpitations. GENERAL:  The patient reports otherwise feeling well with no recent change in appetite, weight gain, or weight loss. RESPIRATORY:  Notable for chronic nonproductive cough described as a "smokers hack." He denies any history of purulent sputum production or hemoptysis or wheezing. GASTROINTESTINAL:  Negative. The patient reports good appetite with normal bowel function. He denies any history of hematochezia, hematemesis, melena, or dysphagia. NEUROLOGIC:  Negative. The patient specifically denies any history of transient monocular blindness or transient numbness or weakness involving either upper or lower extremity suggestive of possible TIA. MUSCULOSKELETAL: Notable for some arthritis involving his right shoulder. GENITOURINARY: Notable for some times difficulty in starting his stream. He also has history of erectile dysfunction. INFECTIOUS:  Negative. The patient specifically denies any recent fevers or chills. HEMATOLOGIC:  Negative. ENDOCRINE: Notable for type 2 diabetes mellitus for which he has been treated for at least 15-20 years. He reports that he did not check his blood sugars at home very frequently because of the  cost of the  associated testing strips. PSYCHIATRIC:  Notable for some history of depression related to his first wifes death. PERIPHERAL VASCULAR:  Negative. The patient specifically denies any symptoms of claudication. HEENT:  Notable for some decrease in hearing acuity, more pronounced in the left ear than the right. He wears glasses.  PAST MEDICAL HISTORY:  As stated previously. Notable for borderline hypertension as well as hyperlipidemia, type 2 diabetes mellitus, longstanding tobacco abuse with probable COPD. He denies any history of previous myocardial infarction, congestive heart failure, or stroke. His diabetes has not been well controlled and apparently, he had a recent hemoglobin A1C level checked which was 11.1.  PAST SURGICAL HISTORY:  Notable for hemorrhoidectomy in the distant past and circumcision in the distant past.  FAMILY HISTORY:  Notable in that his father died of myocardial infarction in his 22s.  SOCIAL HISTORY:  The patient is remarried and lives with his wife and has a very supportive family. He has been retired from the [employer] and [employer] for nearly five years. He is not very active physically and lives a relatively sedentary lifestyle. He has a longstanding history of heavy tobacco use, smoking two packs of cigarettes per day for nearly 50 years. He denies any history of excessive alcohol consumption.  MEDICATIONS PRIOR TO ADMISSION:  1. Zantac 150 mg p.o. b.i.d.  2. Actos 45 mg p.o. q.d.  3. Glyburide 6 mg p.o. b.i.d.  4. Precose 50 mg p.o. t.i.d.  5. Glucophage XR 500 mg p.o. q.h.s.  6. Zocor 80 mg p.o. q.h.s.  7. Lopressor 12.5 mg p.o. b.i.d.  8. Altace 2.5 mg p.o. q.d.  9. Aspirin 325 mg daily. 10. The patient uses Viagra as needed.  ALLERGIES:  The patient reports drug sensitivities to IODINE-CONTAINING CONTRAST.  PHYSICAL EXAMINATION:   GENERAL:  Notable for a thin, well-appearing white male who appears his stated age in no acute  distress.  VITAL SIGNS:  He is afebrile, in normal sinus rhythm by telemetry monitor. Blood pressure is 122/68.  HEENT:  Essentially unrevealing.  NECK:  Supple. There is a right-sided carotid bruits. There is no jugular venous distention. There is no cervical or supraclavicular lymphadenopathy.  CHEST:  Auscultation of the chest reveals clear and symmetrical breath sounds bilaterally. No wheezes or rhonchi are noted.  CARDIOVASCULAR:  Regular rate and rhythm. No murmurs, rubs, or gallops are appreciated.  ABDOMEN:  Soft and nontender. There are no palpable masses. Bowel sounds are present. The liver edge is not enlarged.  EXTREMITIES:  Warm and well perfused. There is mild right lower extremity edema. There is severe varicose veins involving both lower legs, which are much pronounced in the right side than left. The left thigh appears to be more normal and likely has usable saphenous vein for conduit.  GU:  Deferred.  NEUROLOGIC:  Grossly nonfocal and symmetrical throughout.  SKIN:  Clean and dry and healthy-appearing throughout.  VASCULAR:  Notable for diminished pulses in the left lower leg at the ankle with mildly diminished pulses on the right. There is normal Allens test in the left hand with normal capillary refill via ulnar circulation with compression of the radial artery.  DIAGNOSTIC TESTS:  Cardiac catheterization performed today by Dr. Allyson Sabal is reviewed. This demonstrates 60% stenosis of the distal left main coronary artery with severe three-vessel coronary artery disease. There is 80-90% stenosis of the mid left anterior descending coronary artery arriving just at takeoff of a small first diagonal branch. There is  80% ostial stenosis of a large second diagonal branch. There is 60% stenosis of the mid left circumflex coronary artery and 70% proximal stenosis of the large first circumflex marginal branch. There is a 100% occlusion of the right coronary artery  with left to right collateral fillings of a posterolateral branch and a poor filling posterior descending coronary artery. Left ventricular function is reduced with baseline ejection fraction estimated 25-30%. There is inferior wall akinesis and severe global hypokinesis. There is no mitral regurgitation. There is moderate atherosclerotic disease involving the infrarenal aorta and its branches. There are no high-grade lesions.  IMPRESSION:  Severe three-vessel coronary artery disease with moderate to severe left ventricular dysfunction, class III progressive angina, as well as other morbid conditions including longstanding type 2 diabetes mellitus which has not been well controlled, longstanding tobacco abuse, likely chronic obstructive pulmonary disease, peripheral vascular disease, hypercholesterolemia, and bilateral lower extremity varicose veins. I believe that the patient would benefit from elective coronary artery bypass grafting.  PLAN:  We tentatively plan to proceed with surgery, first case tomorrow. We will use intraoperative transesophageal echocardiogram because of the patients left ventricular dysfunction. We will plan on utilizing left internal mammary artery as well as left radial arteries conduit as well as saphenous vein as feasible, particularly from the left thigh. I have outlined at length the indications and potential benefits of coronary artery bypass grafting with the patient and his family. Alternative treatment strategies have been discussed. They understand and accept all associated risks of surgery including but not limited to risk of death, stroke, myocardial infarction, bleeding requiring blood transfusion, respiratory failure, pneumonia, arrhythmia, infection, and recurrent coronary artery disease. All of their questions have been addressed. Dictated by:   Salvatore Decent Cornelius Moras, M.D. Attending Physician:  Tressie Stalker DD:  05/06/02 TD:  05/08/02 Job:  [id number]

## 2011-04-20 NOTE — Op Note (Signed)
[place]. [facility]  Patient:    [name], [name] Visit Number: [id number] MRN: [id number]          Service Type: SUR Location: 2000 2025 01 Attending Physician:  Tressie Stalker Dictated by:   Salvatore Decent. Cornelius Moras, M.D. Proc. Date: 05/07/02 Admit Date:  05/06/2002                             Operative Report  NO DICTATION. Dictated by:   Salvatore Decent Cornelius Moras, M.D. Attending Physician:  Tressie Stalker DD:  05/07/02 TD:  05/10/02 Job: [id number]

## 2011-04-20 NOTE — Consult Note (Signed)
NAMEANCIL, [name]                         ACCOUNT NO.:  [id number]   MEDICAL RECORD NO.:  [id number]                   PATIENT TYPE:  INP   LOCATION:  2022                                 FACILITY:  [facility]   PHYSICIAN:  Claudette Laws, M.D.               DATE OF BIRTH:  Dec 08, 1934   DATE OF CONSULTATION:  12/02/2003  DATE OF DISCHARGE:                                   CONSULTATION   CHIEF COMPLAINT:  Elevated PSA.   REASON FOR CONSULTATION:  This 76 year old man comes in on the cardiology  service with a history of congestive heart failure exacerbation and multiple  other medical problems. However, he also carries a diagnosis of an enlarged  prostate gland noted on CT scan. He also has a history of variable PSA,  although he could not enumerate the levels. Of interest is the fact that his  brother had prostate cancer and underwent radioactive seed implantation here  in [place]. The patient does have some mild symptoms of bladder outlet  obstruction, some hesitancy. He also has erectile dysfunction. Apparently,  he was to come to the office and Dr. Domingo Sep thought that he should make  contact with urologist and plan for followup once he leaves the hospital.   IMPRESSION AND RECOMMENDATIONS:  Noted on Dr. Roque Lias note on the  admitting history and physical. He has a rather complicated history in that  he is on Coumadin. He also has coronary artery disease, status post bypass  grafting. He has diabetes, hypertension, history of postoperative atrial  fibrillation, hypothyroidism and that is to be checked as well, history of  depression.   He was admitted to the [facility] telemetry unit and they  will check serial cardiac enzymes, IV Lasix with strict I&O, and checking on  the full thyroid function. At the time of this dictation, there is no  laboratory work as he was just admitted to the ward. I did review his old  chart. He does have some blood work  from [employer] on December 01, 2003.  His electrolytes were normal with a BUN of 23, creatinine 1.5.  His  hemoglobin was 11.8, hematocrit 35.6. Again, I do not see a PSA  determination.   After a thorough discussion with he and his wife, a decision was made for me  to follow up in our office. At that time I will complete the digital rectal  exam (DRE) as well as recheck his PSA and try to get a record of PSAs,  probably up in [place], [place]. This is where he resides. So I thought  I would hold off on any further workup for now.  I gave the patient my name  and number, and asked them to follow up with me after this hospitalization.  He and his wife agreed to the proposed follow-up and the wife stated that  she will make  sure she gets in for appropriate follow-up. They understand  that if we decide to do a biopsy he will have to get off his Coumadin.  Otherwise, no further workup and I thought this was a reasonable way to  handle the situation. They seemed to agree.                                               Claudette Laws, M.D.    RFS/MEDQ  D:  12/02/2003  T:  12/03/2003  Job:  [id number]

## 2011-04-20 NOTE — Discharge Summary (Signed)
[name], [name]                         ACCOUNT NO.:  [id number]   MEDICAL RECORD NO.:  [id number]                   PATIENT TYPE:  INP   LOCATION:  2022                                 FACILITY:  [facility]   PHYSICIAN:  Dani Gobble, MD                    DATE OF BIRTH:  [birth year]   DATE OF ADMISSION:  12/02/2003  DATE OF DISCHARGE:  12/08/2003                                 DISCHARGE SUMMARY   ADMISSION DIAGNOSES:  1. Acute on chronic congestive heart failure.  2. Coronary artery disease with history of coronary artery bypass grafting.  3. Peripheral vascular disease.  4. Diabetes mellitus, type 2.  5. Hypertension.  6. Hyperlipidemia.  7. Erectile dysfunction.  8. History of postoperative atrial fibrillation.  9. Hypothyroidism.  10.      Enlarged prostate on CT scan.  11.      Depression.   DISCHARGE DIAGNOSES:  1. Acute on chronic congestive heart failure, improved.  2. Recurrent paroxysmal atrial fibrillation during hospitalization.  3. Coronary artery disease with history of coronary artery bypass grafting.  4. Peripheral vascular disease.  5. Diabetes mellitus, type 2.  6. Hypertension.  7. Hyperlipidemia.  8. Erectile dysfunction.  9. History of postoperative atrial fibrillation.  10.      Hypothyroidism.  11.      Enlarged prostate on CT scan.  12.      Depression.   HISTORY OF PRESENT ILLNESS:  The patient is a 76 year old gentleman with  history of diabetes, hypertension, hyperlipidemia and coronary artery  disease.  He underwent bypass surgery in June 2003.  As well he has known  peripheral vascular disease with history of left SFA intervention.  As well  he has had intervention to the iliac.  His previous left ventricular  function showed ejection fraction 35 to 45% and this is improved.  His most  recent echocardiography in October 2004 had shown ejection fraction 50%.  He  had mild MR and TR.   The patient also experienced some postoperative atrial  fibrillation but  converted to normal sinus rhythm and had been continued on Coumadin.  He had  been seen in early November 2004 with complaints of tightness around his  upper epigastrium which is different than his previous angina.  However, in  addition, the patient had also been developing an increased amount of  dyspnea on exertion.  He also reported increased lower extremity edema and  increased abdominal girth.  He denied any paroxysmal nocturnal dyspnea or  orthopnea.   Dr. Domingo Sep obtained a CT scan of the abdomen and pelvis which was negative  for abdominal distention or ascites. It did show incidental findings of  hiatal hernia and right hepatic cysts.  As well the CT scan of the pelvis  suggested moderately enlarged prostate gland.   The patient stated he had been very compliant with his diet.  Dr.  Bradsher  had gotten a Cardiolite scan to make sure this was not ischemic.  This  showed scar with some peri-infarct ischemia that was unchanged from prior  testing.   The patient was brought in for echocardiogram but this did not show any new  abnormalities either.  As well he had laboratory work drawn including  thyroid functions.   The patient then called our office the week of admission with complaints of  progression of dyspnea.  She had scheduled the patient for a cardiac MRI on  December 16, 2003 with concerns that this might be a post surgical  constrictive pericarditis.  At this point Dr. Domingo Sep was concerned his  problem may include a primary lung process, possible prostate cancer with  metastases to the lung and further depression with anxiety component.  At  this time Dr. Domingo Sep felt he was having some increased heart failure.  She  planned to admit him to [facility] telemetry, check serial  cardiac enzymes, treat with intravenous Lasix and strict ins and outs.  She  would adjust his thyroid medication.  She would ask urology to evaluate him  in  regards to his enlarged prostate and elevated PSA.   HOSPITAL COURSE:  On December 02, 2003 the patient was seen by Dr. Etta Grandchild for  urology.  He has a known history of benign prostatic hypertrophy and a  variable PSA.  They planned to follow him up in the office in about a month  after his acute illness had resolved.  It was felt that the patient did not  require any further evaluation at that point.   Also on December 03, 2003 we continued his intravenous Lasix and Aldactone  was added as well.  Also on December 03, 2003 he did develop some recurrent  atrial fibrillation.  Coumadin was restarted.   On December 05, 2003 the patient was continued on Lasix 80 mg intravenous  b.i.d. as well as his Aldactone 25 mg daily.  However, his creatinine rose  from 1.3 on admission up to 1.8 and at that point we held his Altace and  spironolactone.  At that point we planned to change his Lasix to oral.   Over the next day or so we continued to adjust his medications.  However, he  did have some recurrence of atrial fibrillation so we wanted to continue his  Coumadin until his INR was near therapeutic.   Finally, by December 08, 2003 the patient was felt to be stable.  At that  point he still had some shortness of breath but was felt improved.  He was  afebrile at 97.9, pulse 20, blood pressure 128/81, heart rate 66.  His INR  was therapeutic at 2.2.  At this point he was evaluated by Dr. Lavonne Chick  who deems him stable for discharge home.   CONSULTATIONS:  Urology consultation by Dr. Javier Glazier for evaluation of  elevated PSA and also follow up enlarged prostate on CT scan.  At this point  he feels he does not require any further inpatient evaluation.  He plans to  see the patient in follow up in his office in one month.  The patient and  his wife are in agreement.   HOSPITAL PROCEDURES:  None.   LABORATORY DATA:  TSH 0.264.  Free T4 is 1.19.  Free T3 is 2.7.  PSA 3.94. Cardiac enzymes negative  with CK 85, MB 2.3, troponin 0.03, hemoglobin A1C  elevated at 9.6.  Sodium is 141, potassium 3.7, glucose 167, BUN 21,  creatinine 1.3.  white blood cell count 8.6, hemoglobin 11.2, hematocrit  32.8, platelet count 133,000.  On admission INR was 1.9.  At the time of  discharge it was 2.2.   Telemetry showed paroxysmal atrial fibrillation throughout the  hospitalization.  He had episodes of atrial fibrillation as well as sinus  rhythm.   DISCHARGE MEDICATIONS:  1. Altace 2.5 mg one daily.  2. Actos 45 mg in evening.  3. Aspirin 81 mg once daily.  4. Aciphex 20 mg once daily.  5. Vytorin 10/40 once daily.  6. Synthroid 0.1 mg once daily.  7. Lexapro 20 mg once daily.  8. Lasix 80 mg in the morning, 40 mg in the afternoon.  9. Potassium 20 mEq once daily.  10.      Coreg 25 mg twice daily.  11.      Coumadin 5 mg daily except 7.5 on Monday and Friday.  12.      Humulin 33 units in the morning.  13.      Lantus 33 units in the evening.   DISCHARGE INSTRUCTIONS:  Patient is to call [phone] if he has any  increasing shortness of breath, swelling, weight gain of 5 pounds in a week  or 3 pounds in a day.  Patient to have blood drawn to check INR and Pro Time  in one week.  Have blood drawn to check a BMET in one week.   DIET:  Patient is to be on a low salt/sodium diet.  Patient is to be on  fluid restriction at 2 liters.   FOLLOW UP:  Follow up with your diabetic doctor, hemoglobin A1C is 9.6.  Patient is scheduled for cardiac MRI and the patient is to keep that  appointment.  Patient scheduled to see Dr. Domingo Sep back in [place] on  Wednesday December 22, 2003 at 3:30 P.M.  Patient is to call Dr. Talmadge Chad  office and make an appointment to see him for follow up.      Mary B. Easley, P.A.-C.                   Dani Gobble, MD    MBE/MEDQ  D:  12/30/2003  T:  12/31/2003  Job:  [id number]   cc:   Claudette Laws, M.D.  [address]  [place]  [place] [address]  Fax:  [phone]   Corrie Mckusick, M.D.  [address] Laurell Josephs. A  [place]  [place] [address]  Fax: [phone]

## 2011-04-20 NOTE — Cardiovascular Report (Signed)
[name], [name]                         ACCOUNT NO.:  [id number]   MEDICAL RECORD NO.:  [id number]                   PATIENT TYPE:  OIB   LOCATION:  4712                                 FACILITY:  [facility]   PHYSICIAN:  Runell Gess, M.D.             DATE OF BIRTH:  June 08, 1935   DATE OF PROCEDURE:  08/11/2002  DATE OF DISCHARGE:  08/12/2002                              CARDIAC CATHETERIZATION   PROCEDURE. PERFORMED:  Abdominal aortogram, bifemoral runoff, percutaneous  transluminal coronary angioplasty and stent.   INDICATION:  The patient is a 76 year old married white male with a history  of CAD and PVOD. He had a positive Cardiolite May 05, 2002, and underwent  diagnostic coronary arteriography revealing three-vessel disease with LV  dysfunction. He had high-grade iliac disease on the right at that time. He  underwent coronary artery bypass grafting and has stopped smoking in the  interim. His other problems include hypertension, hyperlipidemia, and  noninsulin requiring diabetes. He complains of lifestyle limiting  claudication bilaterally. He presents now for angiography and potential  endovascular therapy.   DESCRIPTION OF PROCEDURE:  The patient was brought to the sixth floor [facility] in the postabsorptive state.  He  was premedication with p.o. Valium. His right groin was prepped and shaved  in the usual sterile fashion. Xylocaine 1% was used for local anesthesia.  A  5-French sheath was inserted into the right femoral artery using standard  Seldinger technique. A 5-French Tennis Racquet catheter was used for a mid  stream and distal abdominal aortography as well as bifemoral runoff.  Visipaque dye was used for the entirety of the case.  Retrograde and aortic  pressures were monitored during the case.   ANGIOGRAPHIC RESULTS:  1. Abdominal aorta:     a. Renal artery - normal.     b. Infrarenal abdominal aorta - normal.  2. Left lower extremity:     a. A 50-60% plaque in the left common iliac artery which looks like a        ledge but no pullback gradient.     b. A 50-60% left external iliac artery stenosis.     c. A 95% mid left SFA with 50% segmental disease in the distal portion.     d. Totally occluded posterior tibialis with two-vessel runoff.  3. Right lower extremity:     a. An 80% distal right common iliac artery stenosis.     b. An 80% proximal right external iliac artery stenosis. There is an        intervening aneurysmal segment between these two stenoses.     c. A 50% segmental mid and distal right SFA with two-vessel runoff.   DESCRIPTION OF PROCEDURE:  Contralateral access was obtained with a 0.035  angled Glidewire and a short IMA catheter previously exchanged for an 0.035  Wholey wire and a 7-French Balkan sheath was  then advanced over the iliac  bifurcation to achieve contralateral access. [facility] wire was then  advanced across the SFA lesion. The patient received 2500 units of heparin  intravenously. PTA was performed with a 5.2 long Powerflex and stenting with  a 7.3 Smart control, self-expanding Nitinol stent. The first dilatation was  performed with a 6.2 long Powerflex resulting in reduction in 95% stenosis  to 0% residual.   The contralateral Balkan was then withdrawn across the iliac bifurcation  into the ipsilateral side. The right common and external iliac artery were  predilated with the 5.2 short Powerflex. A 8 x 18 Genesis OPTA, the  premedicated stent was then deployed in the distal portion of the right  common iliac artery and post dilated with a 9.2 OPTA resulting in reduction  in 80% stenosis to 0% residual. A 7 x 18 Genesis OPTA  was then deployed in the right external iliac artery resulting in 80%  stenosis and 0% residual.   The patient tolerated the procedure well.  ACT was measured and the sheaths  were removed.  Pressure was held on the groin to achieve  hemostasis. The  patient left the lab in stable condition.   PLAN:  Plans will be to hydrate him overnight. He will be discharged home in  the morning. He will have followup Dopplers and ABIs, after which he will  see me back in the office.  Dr. Geanie Cooley was notified of these results.                                               Runell Gess, M.D.    JJB/MEDQ  D:  08/11/2002  T:  08/12/2002  Job:  [id number]   cc:   Peripheral Vascular Angiographic Suite   Sherral Hammers, M.D.   Corrie Mckusick, M.D.  [address]s. [address]  [place]  [place] [address]  Fax: [phone]

## 2011-04-20 NOTE — Cardiovascular Report (Signed)
[place]. [facility]  Patient:    [name], [name] Visit Number: [id number] MRN: [id number]          Service Type: CAT Location: 2300 [id number] [id number] Attending Physician:  Tressie Stalker Dictated by:   Runell Gess, M.D. Admit Date:  05/06/2002   CC:         Cardiac Catheterization Lab  [facility], [address], [place] [address]  Netta Cedars, M.D., Y[facility]y Flemings  Dorthey Sawyer, M.D., [facility], [place], [place].   Cardiac Catheterization  HISTORY:  The patient is a 76 year old married white male patient of Dr. Ledora Bottcher and Beatrice Lecher.  He has a history of hyperlipidemia and diabetes. He has had progressive substernal chest pain with increasing shortness of breath.  He presents now for diagnostic coronary arteriography as an outpatient.  DESCRIPTION OF PROCEDURE:  The patient was brought to the second floor [facility] in the postabsorptive state.  He was premedicated with p.o. Valium.  His right groin was prepped and shaved in the usual sterile fashion.  Xylocaine, 1%, was used for local anesthesia.  A #6 French sheath was inserted into the right femoral artery using the standard Seldinger technique.  There was difficulty traversing the right iliac system and thus, hand shot was obtained through the sidearm sheath revealing tandem high-grade lesions.  This was then navigated with a 0.035 Wholey wire and a right Judkins without difficulty.  Omnipaque dye was used for the entirety of the case.  Retrograde aortic, left ventricular and pullback pressures were recorded.  HEMODYNAMICS:  Aortic systolic pressure 118, diastolic pressure 55.  Left ventricular systolic pressure 123, end diastolic pressure 21.  SELECTIVE CORONARY ANGIOGRAPHY: 1. Left main:  The left main had an eccentric 40% plaque just proximal to the    bifurcation of the LAD and circumflex.  2. LAD:  This is a large  vessel wrapping the apex.  There was a complex    high-grade calcified proximal lesion involving the first diagonal and    septal perforator.  3. Left circumflex:  There was an eccentric calcified plaque proximal to the    first marginal branch that had approximately a 50% stenosis.  4. Right coronary artery:  The vessel was totally occluded in its mid portion    and gave off a large acute marginal which had proximal 80% tandem stenoses.    The distal right filled by circumflex and LAD collaterals.  5. Left internal mammary artery:  This vessel was subselectively visualized    and was widely patent.  It was suitable for use during coronary artery    bypass grafting.  LEFT VENTRICULOGRAPHY:  RAO left ventriculogram was performed using 25 cc of Omnipaque dye at 12 cc per second.  The overall LVEF was estimated at approximately 35% with moderate anteroapical and severe inferior hypokinesia.  DISTAL ABDOMINAL AORTOGRAPHY:  A distal abdominal aortogram was performed using 20 cc of Omnipaque dye at 20 cc per second x2.  There was, at most, 30% bilateral renal artery stenosis.  The infrarenal abdominal aorta was mildly atherosclerotic.  There was a high-grade mid common iliac and proximal right external iliac artery stenosis noted.  IMPRESSION:  The patient has three-vessel disease with moderate left ventricular dysfunction.  His left anterior descending artery is complex and high risk for percutaneous coronary intervention given the fact that it collateralizes the total right, is calcified, and supplies his best functioning wall.  I believe the  best form of therapy for this gentleman would be coronary artery bypass grafting for complete revascularization.  The sheaths were removed and pressure was held on the groin to achieve hemostasis.  The patient left the lab in stable condition.  CVTS was notified as was Dr. Reatha Harps office.  Hopefully the patient will be bypassed this week;  however, if there is a delay, I do feel comfortable allowing the patient to go home for elective bypass next week.  He left the lab in stable condition. Dictated by:   Runell Gess, M.D. Attending Physician:  Tressie Stalker DD:  05/06/02 TD:  05/07/02 Job: [id number]

## 2011-04-20 NOTE — Op Note (Signed)
[place]. [facility]  Patient:    EWING, FANDINO Visit Number: [id number] MRN: [id number]          Service Type: CAT Location: 2300 2305 01 Attending Physician:  Tressie Stalker Dictated by:   Salvatore Decent. Cornelius Moras, M.D. Proc. Date: 05/07/02 Admit Date:  05/06/2002   CC:         Runell Gess, M.D.  Blake Divine, M.D.  Dorthey Sawyer, M.D.  CVTS Office   Operative Report  PREOPERATIVE DIAGNOSIS: Severe three-vessel coronary artery disease with class III progressive angina.  POSTOPERATIVE DIAGNOSIS: Severe three-vessel coronary artery disease with class III progressive angina.  OPERATION: Median sternotomy for coronary artery bypass grafting x6 (left internal mammary artery to distal left anterior descending coronary artery, left radial artery to first circumflex marginal branch, saphenous vein graft to first diagonal branch and sequential saphenous vein graft to second diagonal branch, saphenous vein graft to posterior descending coronary artery and sequential saphenous vein graft to right posterolateral branch).  SURGEON: Salvatore Decent. Cornelius Moras, M.D.  FIRST ASSISTANT: Salvatore Decent. Dorris Fetch, M.D.  SECOND ASSISTANT: Maple Mirza, P.A.  ANESTHESIA: General.  BRIEF CLINICAL NOTE: The patient is a 76 year old white male from [place], [place], retired from the [employer] and [employer], who is followed by Dr. Dorthey Sawyer and referred by Dr. Kem Boroughs and Dr. Nanetta Batty for management of coronary artery disease. The patient has a history of type 2 diabetes mellitus, hyperlipidemia, and a long-standing history of tobacco abuse. He presents with a long-standing history of symptoms consistent with progressive angina. He underwent a stress Cardiolite examination which was positive for ischemic change, and he subsequently underwent elective cardiac catheterization on May 06, 2002, by Dr. Nanetta Batty. This demonstrates left main  coronary stenosis with severe three-vessel coronary artery disease and moderate left ventricular dysfunction. A full consultation note has been dictated previously. Full informed consent has been obtained from the patient and his family. All their questions have been addressed.  DESCRIPTION OF PROCEDURE: The patient is brought to the operating room on the above-mentioned date and central invasive hemodynamic monitoring was established by the anesthesia service under the care and direction of Dr. Adonis Huguenin. Specifically, a Swan-Ganz catheter was placed through the right internal jugular approach. A right radial arterial line was placed. Intravenous antibiotic are administered. The patient is placed in the supine position on the operating table. General endotracheal anesthesia is induced uneventfully.  The patients left hand is examined. A pulse oximetry probe is placed on the patients left index finger and a the pulse oximetry wave form is monitored continuously while temporarily obliterating the left radial pulse.  There is no significant change in the left radial artery pulse oximetry wave form. This confirms the presence of patent palmar arch circulation with good collateral circulation from the ulnar flow. The patient was noted to have a normal Allens test preoperatively.  Transesophageal echocardiogram is performed by Dr. Krista Blue. This demonstrates the presence of moderate dilatation of the left ventricular chamber with overall moderate left ventricular dysfunction. Ejection fraction is estimated 35-40%. There is severe inferior hypokinesis and moderate global hypokinesis. There is trace mitral regurgitation. No other abnormalities are noted.  The patients chest, left upper extremity, abdomen, both groins, and both lower extremities are prepared and draped in a sterile manner. A median sternotomy incision is performed and a left internal mammary artery is dissected from the chest  wall and prepared for bypass grafting. The left internal mammary artery is notably  good quality conduit. Simultaneously, the left radial artery is harvested for grafting through the longitudinal incision along the volar aspect of the patients left forearm by Dr. Charlett Lango. All intervening branches of the radial artery and its associated veins are divided between Hemoclips. The superficial thenar branch of the left radial artery was identified during the dissection and preserved. Simultaneously, saphenous vein was obtained from the patients left thigh and the upper portion of the left lower leg through a series of longitudinal incisions. The saphenous vein is notably good quality conduit. The patient is heparinized systemically. The left radial artery is transected just above the wrist and the distal end doubly oversewn with suture ligatures. The proximal portion of the vessel is inspected for hemostasis and then the proximal end is doubly ligated and divided. The arterial graft is placed in a small container of the patients heparinized blood for storage.  The pericardium is opened. The ascending aorta is normal in appearance. The ascending aorta and the right atrium are cannulated for cardiopulmonary bypass. Adequate heparinization is verified. The cardiopulmonary bypass is begun and the surface of the heart is inspected. There is moderate left ventricular dilatation. There is scarring within the inferior wall of the myocardium consistent with old myocardial infarction. The left anterior descending coronary artery is intramyocardial. Distal sites are selected for ______ bypass grafting. Portions of the saphenous vein, the left radial artery, and the left internal mammary artery are all trimmed to appropriate  lengths. A temperature probe is placed in the left ventricular septum and a styrofoam pad is placed to protect the left phrenic nerve from thermal injury. A cardioplegic  catheter is placed in the ascending aorta.  The patient is cooled to 32 degrees systemic temperature. The aorta cross-clamp is applied and cardioplegia is delivered initially in an antegrade fashion through the aortic root. Ice saline flush is applied for topical isothermia. The initial cardioplegic arrest and myocardial cooling are felt to be excellent. Repeat doses of cardioplegia are administered intermittently throughout the cross-clamp portion of the operation, both through the aortic root and down subsequently placed vein grafts to maintain septal temperature below 15 degrees centigrade.  The following distal coronary bypass anastomoses are performed: 1. The posterior descending coronary artery is grafted with a saphenous vein    graft in a side-to-side fashion. This coronary measures 1.0 mm in diameter    and is of poor quality. 2. The posterolateral branch off the distal right coronary artery is grafted    using the sequential saphenous vein graft off of the vein placed in the    posterior descending coronary artery. This coronary measures 1.2 mm in    diameter and is of fair to poor quality. 3. The circumflex marginal branch is grafted with a left radial artery in an    end-to-side fashion. This coronary measures 1.7 mm in diameter and is of    good quality. 4. The first diagonal branch off the left anterior descending coronary artery    is grafted with a saphenous vein graft in the side-to-side fashion. This    coronary measures 1.5 mm in diameter and is of good quality. 5. The second diagonal branch off the left anterior descending coronary artery    is grafted using the sequential saphenous vein graft off of the vein,    placed in the first diagonal branch. This coronary measures 1.7 mm in    diameter and is of good quality. 6. The distal left anterior descending coronary artery is  grafted over the    left internal mammary artery in an end-to-side fashion. The left  anterior    descending coronary artery is intramyocardial and is quite deep. The distal    anastomosis is therefore placed very far on the distal portion of the    vessel towards the apex of the heart. At this site, the coronary measures    1.1 mm in diameter, but is of good quality. All three proximal anastomoses    are performed directly to the ascending aorta prior to removal of the    aortic cross-clamp. The proximal anastomosis of the left radial artery    graft is constructed using a very small cuff of the saphenous vein as an    interposition at the site of proximal anastomosis because of mismatch in    caliber between the radial artery and the ascending aorta itself. The    septal temperature is noted rise rapidly and dramatically upon reperfusion    of the left internal mammary artery. The aortic cross-clamp was removed    after a total cross-clamp time of 96 minutes.  The heart begins to beat spontaneously without need for cardioversion. All proximal and distal anastomoses are inspected for hemostasis and appropriate graft orientation. Epicardial pacing wires are affixed to the right ventricular outflow tract into the right atrial appendage. The patient is rewarmed to greater than 37 degrees antegrade temperature. The patient is weaned from cardiopulmonary bypass without difficulty. The patients rhythm at separation from bypass is normal sinus rhythm. No inotropic support is required. Total cardiopulmonary bypass time for the operation is 131 minutes.  Followup transesophageal echocardiogram performed after separation from bypass demonstrates preserved left ventricular function with perhaps some slight increase in contractility. No other abnormalities are noted.  The venous and arterial cannulae are removed uneventfully. Protamine is administered to reverse the anticoagulation. The mediastinum and the left chest are irrigated with saline solution containing vancomycin.  Meticulous surgical hemostasis is ascertained. The mediastinum and the left chest are drained with three chest tubes placed through separate stab incisions inferiorly. The median sternotomy is closed in a routine fashion. The left lower extremity incisions are closed in multiple layers in a routine fashion. The left radial incision forearm incision is closed in multiple layers in a routine fashion. All skin incisions are closed with subcuticular skin closures.  The patient tolerated the procedure well and is transported to the surgical intensive care unit in stable condition. There are no intraoperative complications. All sponge, instrument and needle counts are verified correct at completion of the operation. No blood products were administered. Dictated by:   Salvatore Decent Cornelius Moras, M.D. Attending Physician:  Tressie Stalker DD:  05/07/02 TD:  05/11/02 Job: [id number]

## 2011-08-14 ENCOUNTER — Ambulatory Visit (INDEPENDENT_AMBULATORY_CARE_PROVIDER_SITE_OTHER): Payer: Medicare Other | Admitting: Urology

## 2011-08-14 DIAGNOSIS — N400 Enlarged prostate without lower urinary tract symptoms: Secondary | ICD-10-CM

## 2011-08-14 DIAGNOSIS — C61 Malignant neoplasm of prostate: Secondary | ICD-10-CM

## 2011-08-30 LAB — CBC
HCT: 35.3 — ABNORMAL LOW
HCT: 36.1 — ABNORMAL LOW
HCT: 38.1 — ABNORMAL LOW
Hemoglobin: 12.3 — ABNORMAL LOW
Hemoglobin: 12.3 — ABNORMAL LOW
Hemoglobin: 12.7 — ABNORMAL LOW
MCHC: 33.4
MCHC: 34.1
MCHC: 34.9
MCV: 88.9
MCV: 89.7
MCV: 92.1
Platelets: 190
Platelets: 201
Platelets: 208
RBC: 3.98 — ABNORMAL LOW
RBC: 4.02 — ABNORMAL LOW
RBC: 4.14 — ABNORMAL LOW
RDW: 14.8
RDW: 15.1
RDW: 15.2
WBC: 12 — ABNORMAL HIGH
WBC: 13 — ABNORMAL HIGH
WBC: 15.9 — ABNORMAL HIGH

## 2011-08-30 LAB — DIFFERENTIAL
Basophils Absolute: 0
Basophils Absolute: 0
Basophils Absolute: 0
Basophils Relative: 0
Eosinophils Absolute: 0
Eosinophils Relative: 0
Eosinophils Relative: 0
Lymphocytes Relative: 12
Lymphocytes Relative: 13
Lymphocytes Relative: 5 — ABNORMAL LOW
Lymphs Abs: 1.6
Lymphs Abs: 2
Monocytes Absolute: 0.9
Monocytes Absolute: 1.2 — ABNORMAL HIGH
Monocytes Relative: 7
Neutro Abs: 12.7 — ABNORMAL HIGH
Neutro Abs: 9.4 — ABNORMAL HIGH

## 2011-08-30 LAB — AMMONIA: Ammonia: 16

## 2011-08-30 LAB — HEPATIC FUNCTION PANEL
ALT: 13
AST: 25
Albumin: 3.3 — ABNORMAL LOW
Alkaline Phosphatase: 72
Bilirubin, Direct: 0.3
Indirect Bilirubin: 0.8
Total Bilirubin: 1.1
Total Protein: 7.4

## 2011-08-30 LAB — BLOOD GAS, ARTERIAL
Bicarbonate: 23.3
O2 Content: 2
O2 Saturation: 96.8
Patient temperature: 37
pH, Arterial: 7.455 — ABNORMAL HIGH
pO2, Arterial: 89.6

## 2011-08-30 LAB — BASIC METABOLIC PANEL WITH GFR
BUN: 58 — ABNORMAL HIGH
CO2: 27
Calcium: 9
Chloride: 101
Creatinine, Ser: 1.6 — ABNORMAL HIGH
GFR calc non Af Amer: 43 — ABNORMAL LOW
Glucose, Bld: 227 — ABNORMAL HIGH
Potassium: 3.3 — ABNORMAL LOW
Sodium: 138

## 2011-08-30 LAB — BASIC METABOLIC PANEL
BUN: 18
BUN: 31 — ABNORMAL HIGH
BUN: 87 — ABNORMAL HIGH
CO2: 26
CO2: 30
Calcium: 9.4
Creatinine, Ser: 1.2
Creatinine, Ser: 1.31
GFR calc Af Amer: >60
GFR calc non Af Amer: 20 — ABNORMAL LOW
GFR calc non Af Amer: 54 — ABNORMAL LOW
Glucose, Bld: 1098
Glucose, Bld: 131 — ABNORMAL HIGH
Potassium: 4.5

## 2011-08-30 LAB — GLUCOSE, RANDOM
Glucose, Bld: 413 — ABNORMAL HIGH
Glucose, Bld: 431 — ABNORMAL HIGH
Glucose, Bld: 499 — ABNORMAL HIGH
Glucose, Bld: 510
Glucose, Bld: 563
Glucose, Bld: 802

## 2011-08-30 LAB — COMPREHENSIVE METABOLIC PANEL WITH GFR
ALT: 9
AST: 17
Albumin: 2.9 — ABNORMAL LOW
Alkaline Phosphatase: 62
BUN: 84 — ABNORMAL HIGH
CO2: 27
Calcium: 9.2
Chloride: 92 — ABNORMAL LOW
Creatinine, Ser: 2.81 — ABNORMAL HIGH
GFR calc non Af Amer: 22 — ABNORMAL LOW
Glucose, Bld: 336 — ABNORMAL HIGH
Potassium: 2.9 — ABNORMAL LOW
Sodium: 133 — ABNORMAL LOW
Total Bilirubin: 0.8
Total Protein: 6.7

## 2011-08-30 LAB — HOMOCYSTEINE: Homocysteine: 12.9

## 2011-08-30 LAB — APTT: aPTT: 56 — ABNORMAL HIGH

## 2011-08-30 LAB — URINALYSIS, ROUTINE W REFLEX MICROSCOPIC
Glucose, UA: >1000 — AB
Hgb urine dipstick: NEGATIVE
Protein, ur: NEGATIVE
pH: 5

## 2011-08-30 LAB — PROTIME-INR
INR: 1.2
Prothrombin Time: 15.6 — ABNORMAL HIGH
Prothrombin Time: 25.1 — ABNORMAL HIGH

## 2011-08-30 LAB — URINE MICROSCOPIC-ADD ON

## 2011-08-30 LAB — MAGNESIUM: Magnesium: 2.5

## 2011-09-10 LAB — PREPARE FRESH FROZEN PLASMA

## 2011-09-10 LAB — HEMOGLOBIN AND HEMATOCRIT, BLOOD
HCT: 23.6 — ABNORMAL LOW
HCT: 26 — ABNORMAL LOW
Hemoglobin: 8.6 — ABNORMAL LOW

## 2011-09-10 LAB — BASIC METABOLIC PANEL
CO2: 24
Calcium: 8.8
Chloride: 106
Creatinine, Ser: 1.8 — ABNORMAL HIGH
GFR calc Af Amer: 45 — ABNORMAL LOW
GFR calc Af Amer: >60
GFR calc non Af Amer: 37 — ABNORMAL LOW
Glucose, Bld: 186 — ABNORMAL HIGH
Glucose, Bld: 216 — ABNORMAL HIGH
Sodium: 138
Sodium: 140

## 2011-09-10 LAB — CBC
HCT: 18.2 — ABNORMAL LOW
HCT: 28.5 — ABNORMAL LOW
HCT: 28.7 — ABNORMAL LOW
HCT: 29.4 — ABNORMAL LOW
Hemoglobin: 6.1 — CL
Hemoglobin: 8.4 — ABNORMAL LOW
Hemoglobin: 9.4 — ABNORMAL LOW
Hemoglobin: 9.6 — ABNORMAL LOW
MCHC: 32.7
MCHC: 32.8
MCHC: 33.4
MCV: 86
MCV: 86.3
MCV: 87
MCV: 87.2
MCV: 87.6
Platelets: 254
RBC: 3.31 — ABNORMAL LOW
RBC: 3.37 — ABNORMAL LOW
RDW: 16 — ABNORMAL HIGH
RDW: 16.3 — ABNORMAL HIGH
RDW: 16.4 — ABNORMAL HIGH
WBC: 7.6

## 2011-09-10 LAB — COMPREHENSIVE METABOLIC PANEL
AST: 21
Albumin: 2.9 — ABNORMAL LOW
Alkaline Phosphatase: 47
Alkaline Phosphatase: 47
BUN: 30 — ABNORMAL HIGH
BUN: 68 — ABNORMAL HIGH
CO2: 28
Calcium: 8.5
Chloride: 106
Creatinine, Ser: 1.39
GFR calc non Af Amer: 51 — ABNORMAL LOW
Glucose, Bld: 185 — ABNORMAL HIGH
Potassium: 3.3 — ABNORMAL LOW
Potassium: 4
Total Bilirubin: 2.2 — ABNORMAL HIGH
Total Protein: 5.7 — ABNORMAL LOW

## 2011-09-10 LAB — CARDIAC PANEL(CRET KIN+CKTOT+MB+TROPI)
CK, MB: 2
Relative Index: INVALID
Total CK: 37
Total CK: 41
Troponin I: 0.05

## 2011-09-10 LAB — DIFFERENTIAL
Basophils Absolute: 0
Basophils Absolute: 0
Basophils Relative: 0
Basophils Relative: 1
Eosinophils Relative: 2
Monocytes Absolute: 0.7
Monocytes Relative: 8
Neutro Abs: 5.2
Neutro Abs: 7.8 — ABNORMAL HIGH
Neutrophils Relative %: 74

## 2011-09-10 LAB — TYPE AND SCREEN: ABO/RH(D): A POS

## 2011-09-10 LAB — PREPARE RBC (CROSSMATCH)

## 2011-09-10 LAB — LIPID PANEL
Cholesterol: 115
HDL: 25 — ABNORMAL LOW
Total CHOL/HDL Ratio: 4.6
VLDL: 40

## 2011-09-10 LAB — RETICULOCYTES
RBC.: 3.04 — ABNORMAL LOW
Retic Count, Absolute: 130.7
Retic Ct Pct: 4.3 — ABNORMAL HIGH

## 2011-09-10 LAB — FOLATE: Folate: 18.2

## 2011-09-10 LAB — URINALYSIS, ROUTINE W REFLEX MICROSCOPIC
Glucose, UA: NEGATIVE
pH: 5.5

## 2011-09-10 LAB — PROTIME-INR
INR: 1.2
INR: 1.7 — ABNORMAL HIGH
INR: 2.5 — ABNORMAL HIGH
INR: 3.9 — ABNORMAL HIGH
Prothrombin Time: 18.4 — ABNORMAL HIGH
Prothrombin Time: 20.3 — ABNORMAL HIGH
Prothrombin Time: 28 — ABNORMAL HIGH

## 2011-09-10 LAB — OCCULT BLOOD X 1 CARD TO LAB, STOOL: Fecal Occult Bld: POSITIVE

## 2011-09-10 LAB — HEPATIC FUNCTION PANEL
Alkaline Phosphatase: 51
Bilirubin, Direct: 0.4 — ABNORMAL HIGH
Indirect Bilirubin: 1.3 — ABNORMAL HIGH
Total Bilirubin: 1.7 — ABNORMAL HIGH

## 2011-09-10 LAB — B-NATRIURETIC PEPTIDE (CONVERTED LAB): Pro B Natriuretic peptide (BNP): 490 — ABNORMAL HIGH

## 2011-09-10 LAB — IRON AND TIBC: TIBC: 399

## 2011-09-10 LAB — HEMOGLOBIN A1C: Mean Plasma Glucose: 175

## 2011-11-05 ENCOUNTER — Encounter (HOSPITAL_COMMUNITY): Payer: Self-pay | Admitting: General Practice

## 2011-11-05 ENCOUNTER — Inpatient Hospital Stay (HOSPITAL_COMMUNITY)
Admission: AD | Admit: 2011-11-05 | Discharge: 2011-11-14 | DRG: 291 | Disposition: A | Discharge status: Home / Self Care | Payer: Medicare Other | Source: Ambulatory Visit | Attending: Cardiovascular Disease | Admitting: Cardiovascular Disease

## 2011-11-05 DIAGNOSIS — I129 Hypertensive chronic kidney disease with stage 1 through stage 4 chronic kidney disease, or unspecified chronic kidney disease: Secondary | ICD-10-CM | POA: Diagnosis present

## 2011-11-05 DIAGNOSIS — I4729 Other ventricular tachycardia: Secondary | ICD-10-CM | POA: Diagnosis present

## 2011-11-05 DIAGNOSIS — G4730 Sleep apnea, unspecified: Secondary | ICD-10-CM | POA: Diagnosis present

## 2011-11-05 DIAGNOSIS — D638 Anemia in other chronic diseases classified elsewhere: Secondary | ICD-10-CM | POA: Diagnosis present

## 2011-11-05 DIAGNOSIS — Z951 Presence of aortocoronary bypass graft: Secondary | ICD-10-CM

## 2011-11-05 DIAGNOSIS — Z6831 Body mass index (BMI) 31.0-31.9, adult: Secondary | ICD-10-CM

## 2011-11-05 DIAGNOSIS — I2589 Other forms of chronic ischemic heart disease: Secondary | ICD-10-CM | POA: Diagnosis present

## 2011-11-05 DIAGNOSIS — Z79899 Other long term (current) drug therapy: Secondary | ICD-10-CM

## 2011-11-05 DIAGNOSIS — Z87891 Personal history of nicotine dependence: Secondary | ICD-10-CM

## 2011-11-05 DIAGNOSIS — G4733 Obstructive sleep apnea (adult) (pediatric): Secondary | ICD-10-CM | POA: Diagnosis present

## 2011-11-05 DIAGNOSIS — IMO0002 Reserved for concepts with insufficient information to code with codable children: Secondary | ICD-10-CM

## 2011-11-05 DIAGNOSIS — I509 Heart failure, unspecified: Secondary | ICD-10-CM | POA: Diagnosis present

## 2011-11-05 DIAGNOSIS — I472 Ventricular tachycardia, unspecified: Secondary | ICD-10-CM | POA: Diagnosis present

## 2011-11-05 DIAGNOSIS — N184 Chronic kidney disease, stage 4 (severe): Secondary | ICD-10-CM | POA: Diagnosis present

## 2011-11-05 DIAGNOSIS — I480 Paroxysmal atrial fibrillation: Secondary | ICD-10-CM | POA: Diagnosis present

## 2011-11-05 DIAGNOSIS — Z794 Long term (current) use of insulin: Secondary | ICD-10-CM

## 2011-11-05 DIAGNOSIS — E785 Hyperlipidemia, unspecified: Secondary | ICD-10-CM | POA: Diagnosis present

## 2011-11-05 DIAGNOSIS — Z7901 Long term (current) use of anticoagulants: Secondary | ICD-10-CM

## 2011-11-05 DIAGNOSIS — E119 Type 2 diabetes mellitus without complications: Secondary | ICD-10-CM | POA: Diagnosis present

## 2011-11-05 DIAGNOSIS — I4891 Unspecified atrial fibrillation: Secondary | ICD-10-CM | POA: Diagnosis present

## 2011-11-05 DIAGNOSIS — I10 Essential (primary) hypertension: Secondary | ICD-10-CM | POA: Diagnosis present

## 2011-11-05 DIAGNOSIS — R768 Other specified abnormal immunological findings in serum: Secondary | ICD-10-CM | POA: Diagnosis present

## 2011-11-05 DIAGNOSIS — I739 Peripheral vascular disease, unspecified: Secondary | ICD-10-CM | POA: Diagnosis present

## 2011-11-05 DIAGNOSIS — I5023 Acute on chronic systolic (congestive) heart failure: Principal | ICD-10-CM | POA: Diagnosis present

## 2011-11-05 DIAGNOSIS — J189 Pneumonia, unspecified organism: Secondary | ICD-10-CM | POA: Diagnosis present

## 2011-11-05 DIAGNOSIS — I255 Ischemic cardiomyopathy: Secondary | ICD-10-CM | POA: Diagnosis present

## 2011-11-05 DIAGNOSIS — I2510 Atherosclerotic heart disease of native coronary artery without angina pectoris: Secondary | ICD-10-CM | POA: Diagnosis present

## 2011-11-05 DIAGNOSIS — Z95810 Presence of automatic (implantable) cardiac defibrillator: Secondary | ICD-10-CM

## 2011-11-05 DIAGNOSIS — E876 Hypokalemia: Secondary | ICD-10-CM | POA: Diagnosis present

## 2011-11-05 HISTORY — DX: Chronic obstructive pulmonary disease, unspecified: J44.9

## 2011-11-05 LAB — COMPREHENSIVE METABOLIC PANEL
Albumin: 2.9 g/dL — ABNORMAL LOW (ref 3.5–5.2)
Alkaline Phosphatase: 101 U/L (ref 39–117)
BUN: 58 mg/dL — ABNORMAL HIGH (ref 6–23)
Potassium: 3.2 mEq/L — ABNORMAL LOW (ref 3.5–5.1)
Sodium: 135 mEq/L (ref 135–145)
Total Protein: 6.7 g/dL (ref 6.0–8.3)

## 2011-11-05 LAB — PROTIME-INR
INR: 1.99 — ABNORMAL HIGH (ref 0.00–1.49)
Prothrombin Time: 22.9 seconds — ABNORMAL HIGH (ref 11.6–15.2)

## 2011-11-05 LAB — DIFFERENTIAL
Basophils Relative: 0 % (ref 0–1)
Eosinophils Absolute: 0 10*3/uL (ref 0.0–0.7)
Monocytes Relative: 8 % (ref 3–12)
Neutrophils Relative %: 86 % — ABNORMAL HIGH (ref 43–77)

## 2011-11-05 LAB — GLUCOSE, CAPILLARY: Glucose-Capillary: 134 mg/dL — ABNORMAL HIGH (ref 70–99)

## 2011-11-05 LAB — CBC
MCH: 28.3 pg (ref 26.0–34.0)
MCHC: 31.9 g/dL (ref 30.0–36.0)
Platelets: 148 10*3/uL — ABNORMAL LOW (ref 150–400)

## 2011-11-05 LAB — MAGNESIUM: Magnesium: 2.6 mg/dL — ABNORMAL HIGH (ref 1.5–2.5)

## 2011-11-05 LAB — APTT: aPTT: 47 seconds — ABNORMAL HIGH (ref 24–37)

## 2011-11-05 MED ORDER — SODIUM CHLORIDE 0.9 % IJ SOLN: 3.0000 mL | INTRAMUSCULAR | Status: DC | PRN

## 2011-11-05 MED ORDER — SIMVASTATIN 40 MG PO TABS
40.0000 mg | ORAL_TABLET | Freq: Every day | ORAL | Status: DC
  Administered 2011-11-05 – 2011-11-13 (×9): 40 mg via ORAL
  Filled 2011-11-05 (×10): qty 1

## 2011-11-05 MED ORDER — ONDANSETRON HCL 4 MG/2ML IJ SOLN: 4.0000 mg | Freq: Four times a day (QID) | INTRAMUSCULAR | Status: DC | PRN

## 2011-11-05 MED ORDER — INSULIN ASPART 100 UNIT/ML ~~LOC~~ SOLN
15.0000 [IU] | Freq: Three times a day (TID) | SUBCUTANEOUS | Status: DC
  Administered 2011-11-05 – 2011-11-14 (×24): 15 [IU] via SUBCUTANEOUS
  Filled 2011-11-05 (×2): qty 3

## 2011-11-05 MED ORDER — FUROSEMIDE 10 MG/ML IJ SOLN
80.0000 mg | Freq: Two times a day (BID) | INTRAMUSCULAR | Status: DC
  Administered 2011-11-06 – 2011-11-07 (×2): 80 mg via INTRAVENOUS
  Filled 2011-11-05 (×5): qty 8

## 2011-11-05 MED ORDER — ACETAMINOPHEN 325 MG PO TABS: 650.0000 mg | ORAL_TABLET | ORAL | Status: DC | PRN

## 2011-11-05 MED ORDER — PANTOPRAZOLE SODIUM 40 MG PO TBEC
40.0000 mg | DELAYED_RELEASE_TABLET | Freq: Every day | ORAL | Status: DC
  Administered 2011-11-05 – 2011-11-13 (×9): 40 mg via ORAL
  Filled 2011-11-05 (×8): qty 1

## 2011-11-05 MED ORDER — INSULIN GLARGINE 100 UNIT/ML ~~LOC~~ SOLN
80.0000 [IU] | SUBCUTANEOUS | Status: DC
  Administered 2011-11-06 – 2011-11-14 (×9): 80 [IU] via SUBCUTANEOUS
  Filled 2011-11-05 (×2): qty 3

## 2011-11-05 MED ORDER — ACETAMINOPHEN 325 MG PO TABS
650.0000 mg | ORAL_TABLET | ORAL | Status: DC | PRN
  Filled 2011-11-05: qty 2

## 2011-11-05 MED ORDER — ALLOPURINOL 300 MG PO TABS
300.0000 mg | ORAL_TABLET | Freq: Every day | ORAL | Status: DC
  Administered 2011-11-05 – 2011-11-14 (×11): 300 mg via ORAL
  Filled 2011-11-05 (×10): qty 1

## 2011-11-05 MED ORDER — HYDRALAZINE HCL 10 MG PO TABS
10.0000 mg | ORAL_TABLET | Freq: Three times a day (TID) | ORAL | Status: DC
  Administered 2011-11-05 – 2011-11-07 (×5): 10 mg via ORAL
  Filled 2011-11-05 (×8): qty 1

## 2011-11-05 MED ORDER — SODIUM CHLORIDE 0.9 % IJ SOLN
3.0000 mL | Freq: Two times a day (BID) | INTRAMUSCULAR | Status: DC
  Administered 2011-11-05 – 2011-11-13 (×12): 3 mL via INTRAVENOUS

## 2011-11-05 MED ORDER — AZITHROMYCIN 250 MG PO TABS
250.0000 mg | ORAL_TABLET | Freq: Every day | ORAL | Status: AC
  Administered 2011-11-06 – 2011-11-09 (×4): 250 mg via ORAL
  Filled 2011-11-05 (×4): qty 1

## 2011-11-05 MED ORDER — WARFARIN SODIUM 5 MG PO TABS
5.0000 mg | ORAL_TABLET | Freq: Once | ORAL | Status: DC
  Filled 2011-11-05: qty 1

## 2011-11-05 MED ORDER — LEVOTHYROXINE SODIUM 50 MCG PO TABS
50.0000 ug | ORAL_TABLET | ORAL | Status: DC
  Administered 2011-11-06 – 2011-11-14 (×10): 50 ug via ORAL
  Filled 2011-11-05 (×11): qty 1

## 2011-11-05 MED ORDER — DEXTROSE 5 % IV SOLN
1.0000 g | INTRAVENOUS | Status: DC
  Administered 2011-11-05 – 2011-11-10 (×6): 1 g via INTRAVENOUS
  Filled 2011-11-05 (×8): qty 10

## 2011-11-05 MED ORDER — METOLAZONE 5 MG PO TABS
5.0000 mg | ORAL_TABLET | ORAL | Status: DC
  Administered 2011-11-06 – 2011-11-07 (×2): 5 mg via ORAL
  Filled 2011-11-05 (×3): qty 1

## 2011-11-05 MED ORDER — SODIUM CHLORIDE 0.9 % IV SOLN
250.0000 mL | INTRAVENOUS | Status: DC | PRN
  Administered 2011-11-09 – 2011-11-11 (×2): 250 mL via INTRAVENOUS

## 2011-11-05 MED ORDER — PREDNISONE 2.5 MG PO TABS
12.5000 mg | ORAL_TABLET | ORAL | Status: DC
  Administered 2011-11-06 – 2011-11-12 (×8): 12.5 mg via ORAL
  Filled 2011-11-05 (×9): qty 1

## 2011-11-05 MED ORDER — POTASSIUM CHLORIDE CRYS ER 20 MEQ PO TBCR
20.0000 meq | EXTENDED_RELEASE_TABLET | Freq: Three times a day (TID) | ORAL | Status: AC
  Administered 2011-11-05 – 2011-11-06 (×3): 20 meq via ORAL
  Filled 2011-11-05 (×3): qty 1

## 2011-11-05 MED ORDER — FERROUS SULFATE 325 (65 FE) MG PO TABS
325.0000 mg | ORAL_TABLET | Freq: Two times a day (BID) | ORAL | Status: DC
  Administered 2011-11-05 – 2011-11-14 (×19): 325 mg via ORAL
  Filled 2011-11-05 (×20): qty 1

## 2011-11-05 MED ORDER — CARVEDILOL 25 MG PO TABS
25.0000 mg | ORAL_TABLET | Freq: Two times a day (BID) | ORAL | Status: DC
  Administered 2011-11-05 – 2011-11-07 (×4): 25 mg via ORAL
  Filled 2011-11-05 (×6): qty 1

## 2011-11-05 MED ORDER — NITROGLYCERIN IN D5W 200-5 MCG/ML-% IV SOLN
10.0000 ug/min | INTRAVENOUS | Status: DC
  Administered 2011-11-05: 10 ug/min via INTRAVENOUS
  Filled 2011-11-05: qty 250

## 2011-11-05 MED ORDER — AZITHROMYCIN 500 MG PO TABS
500.0000 mg | ORAL_TABLET | Freq: Every day | ORAL | Status: AC
  Administered 2011-11-05: 500 mg via ORAL
  Filled 2011-11-05: qty 1

## 2011-11-05 MED ORDER — ACETAMINOPHEN 325 MG PO TABS
650.0000 mg | ORAL_TABLET | ORAL | Status: DC | PRN
  Administered 2011-11-07 – 2011-11-10 (×2): 650 mg via ORAL
  Filled 2011-11-05: qty 2

## 2011-11-05 NOTE — H&P (Addendum)
Date of Initial H&P:10/19/2011, updated as follows.  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 Claude Manges, [place], [place]). 33 lb weight gain on top of previous hypervolemia (baseline weight probably around 225 lb, peaked at 275 lb, now 258 lb. Lost 17 lb in last 2 weeks on metolazone 5 mg daily and furosemide 80 mg PO bid, but feels worse. Has fallen twice in last couple of days, without syncope or dizziness - weak legs. Hyperglycemia is worse since on steroids.  Not on ACEi and spironolactone due to renal insufficiency and low BP. On maximum dose beta blocker. No angina.  HR 70, BP 130/65, RR 24, 6'2", 258 lb, 67F  General appearance: alert and mild distress Neck: no adenopathy, no carotid bruit, supple, symmetrical, trachea midline, thyroid not enlarged, symmetric, no tenderness/mass/nodules and JVD to jaw angle Lungs: diminished breath sounds bilaterally, rales bibasilar and wheezes bilaterally Heart: irregularly irregular rhythm, S3 present and systolic murmur: holosystolic 2/6, harsh at lower left sternal border Abdomen: abnormal findings:  hepatomegaly, liver edge palpable 5 cm below costal margin Extremities: edema 4+ bilaterally, hard pitting Pulses: 2+ and symmetric Skin: Skin color, texture, turgor normal. No rashes or lesions or cyanotic feet Neurologic: Alert and oriented X 3, normal strength and tone. Normal symmetric reflexes. Normal coordination and gait nonfocal.   Admit for monitored diuresis with iv furosemide. May need furosemide drip and iv nitroglycerin.  Elevated WBC and cough productive of brownish, rusty thick sputum. Start antibiotics after blood cultures.Sputum culture. CXR pending.  Wary of increase in INR with  antibiotics.  Thurmon Fair, MD, [facility] [phone] 8:26 PM

## 2011-11-05 NOTE — Progress Notes (Signed)
ANTICOAGULATION CONSULT NOTE - Initial Consult  Pharmacy Consult for coumadin Indication: atrial fibrillation  No Known Allergies  Patient Measurements: Height: 6\' 2"  (188 cm) Weight: 254 lb 3.1 oz (115.3 kg) (scale c) IBW/kg (Calculated) : 82.2  Adjusted Body Weight:   Vital Signs: Temp: 98.9 F (37.2 C) (12/03 1700) BP: 169/82 mmHg (12/03 1700) Pulse Rate: 73  (12/03 1700)  Labs:  [facility] 11/05/11 1923  HGB 12.1*  HCT 37.9*  PLT 148*  APTT 47*  LABPROT 22.9*  INR 1.99*  HEPARINUNFRC --  CREATININE 2.18*  CKTOTAL --  CKMB --  TROPONINI --   Estimated Creatinine Clearance: 40.1 ml/min (by C-G formula based on Cr of 2.18).  Medical History: Past Medical History  Diagnosis Date  . Shortness of breath   . Anemia   . Chronic kidney disease     renal insufficiency  . CHF (congestive heart failure)   . Dysrhythmia     atrial fibrilation  . Myocardial infarction   . COPD (chronic obstructive pulmonary disease)   . Diabetes mellitus   . Coronary artery disease     Medications:  Scheduled:    . allopurinol  300 mg Oral Daily  . azithromycin  500 mg Oral Daily   Followed by  . azithromycin  250 mg Oral Daily  . carvedilol  25 mg Oral BID WC  . cefTRIAXone (ROCEPHIN)  IV  1 g Intravenous Q24H  . ferrous sulfate  325 mg Oral BID WC  . furosemide  80 mg Intravenous BID  . insulin aspart  15 Units Subcutaneous TID WC  . insulin glargine  80 Units Subcutaneous Q0700  . levothyroxine  50 mcg Oral Q0700  . metolazone  5 mg Oral Q0700  . pantoprazole  40 mg Oral Q1200  . predniSONE  12.5 mg Oral Q0700  . simvastatin  40 mg Oral q1800  . sodium chloride  3 mL Intravenous Q12H  . DISCONTD: warfarin  5 mg Oral Once   Infusions:    . nitroGLYCERIN      Assessment: 76yo male with hx of afib is currently on coumadin prior to admission.  INR 1.99. Patient was on coumadin 5mg  po qday, except 2.5mg  on Fridays prior to admission.  Already had dose for today Goal  of Therapy:  INR 2-3   Plan:  1) No coumadin tonight (already had dose) 2) INR in am  Karl Erway, [name] 11/05/2011,8:28 PM

## 2011-11-06 ENCOUNTER — Inpatient Hospital Stay (HOSPITAL_COMMUNITY): Payer: Medicare Other

## 2011-11-06 DIAGNOSIS — E876 Hypokalemia: Secondary | ICD-10-CM | POA: Diagnosis present

## 2011-11-06 LAB — GLUCOSE, CAPILLARY
Glucose-Capillary: 206 mg/dL — ABNORMAL HIGH (ref 70–99)
Glucose-Capillary: 128 mg/dL — ABNORMAL HIGH (ref 70–99)

## 2011-11-06 LAB — EXPECTORATED SPUTUM ASSESSMENT W GRAM STAIN, RFLX TO RESP C

## 2011-11-06 LAB — TSH: TSH: 1.416 u[IU]/mL (ref 0.350–4.500)

## 2011-11-06 LAB — BASIC METABOLIC PANEL
Calcium: 9.3 mg/dL (ref 8.4–10.5)
Chloride: 90 mEq/L — ABNORMAL LOW (ref 96–112)
Creatinine, Ser: 2.22 mg/dL — ABNORMAL HIGH (ref 0.50–1.35)
GFR calc Af Amer: 32 mL/min — ABNORMAL LOW (ref >90)
GFR calc non Af Amer: 27 mL/min — ABNORMAL LOW (ref >90)

## 2011-11-06 LAB — PROTIME-INR: Prothrombin Time: 24.3 seconds — ABNORMAL HIGH (ref 11.6–15.2)

## 2011-11-06 MED ORDER — INSULIN ASPART 100 UNIT/ML ~~LOC~~ SOLN
0.0000 [IU] | Freq: Three times a day (TID) | SUBCUTANEOUS | Status: DC
  Administered 2011-11-06: 11 [IU] via SUBCUTANEOUS
  Administered 2011-11-07: 5 [IU] via SUBCUTANEOUS
  Administered 2011-11-07 – 2011-11-08 (×3): 11 [IU] via SUBCUTANEOUS
  Administered 2011-11-08: 8 [IU] via SUBCUTANEOUS
  Administered 2011-11-09 (×2): 5 [IU] via SUBCUTANEOUS
  Administered 2011-11-09: 3 [IU] via SUBCUTANEOUS
  Administered 2011-11-10: 5 [IU] via SUBCUTANEOUS
  Administered 2011-11-10: 11 [IU] via SUBCUTANEOUS
  Administered 2011-11-10: 5 [IU] via SUBCUTANEOUS
  Administered 2011-11-11: 3 [IU] via SUBCUTANEOUS
  Administered 2011-11-11: 4 [IU] via SUBCUTANEOUS
  Administered 2011-11-11: 5 [IU] via SUBCUTANEOUS
  Administered 2011-11-12: 2 [IU] via SUBCUTANEOUS
  Administered 2011-11-12: 11 [IU] via SUBCUTANEOUS
  Administered 2011-11-13: 8 [IU] via SUBCUTANEOUS
  Administered 2011-11-14: 5 [IU] via SUBCUTANEOUS
  Filled 2011-11-06 (×2): qty 3

## 2011-11-06 MED ORDER — DOCUSATE SODIUM 100 MG PO CAPS
100.0000 mg | ORAL_CAPSULE | Freq: Two times a day (BID) | ORAL | Status: DC | PRN
  Filled 2011-11-06: qty 1

## 2011-11-06 MED ORDER — WARFARIN SODIUM 5 MG PO TABS
5.0000 mg | ORAL_TABLET | ORAL | Status: DC
  Administered 2011-11-06 – 2011-11-13 (×7): 5 mg via ORAL
  Filled 2011-11-06 (×8): qty 1

## 2011-11-06 MED ORDER — WARFARIN SODIUM 2.5 MG PO TABS
2.5000 mg | ORAL_TABLET | ORAL | Status: DC
  Administered 2011-11-09: 2.5 mg via ORAL
  Filled 2011-11-06: qty 1

## 2011-11-06 MED ORDER — LEVALBUTEROL HCL 0.63 MG/3ML IN NEBU
0.6300 mg | INHALATION_SOLUTION | Freq: Three times a day (TID) | RESPIRATORY_TRACT | Status: DC
  Administered 2011-11-06 – 2011-11-08 (×8): 0.63 mg via RESPIRATORY_TRACT
  Filled 2011-11-06 (×14): qty 3

## 2011-11-06 MED ORDER — NITROGLYCERIN 2 % TD OINT
1.0000 [in_us] | TOPICAL_OINTMENT | Freq: Three times a day (TID) | TRANSDERMAL | Status: DC
  Administered 2011-11-06 – 2011-11-09 (×9): 1 [in_us] via TOPICAL
  Filled 2011-11-06 (×2): qty 30

## 2011-11-06 MED ORDER — MILRINONE IN DEXTROSE 200-5 MCG/ML-% IV SOLN
0.2500 ug/kg/min | INTRAVENOUS | Status: DC
  Administered 2011-11-06 – 2011-11-11 (×8): 0.25 ug/kg/min via INTRAVENOUS
  Filled 2011-11-06 (×10): qty 100

## 2011-11-06 NOTE — Progress Notes (Signed)
Echocardiogram 2D Echocardiogram has been performed.  Katheren Puller 11/06/2011, 4:26 PM

## 2011-11-06 NOTE — Progress Notes (Signed)
   CARE MANAGEMENT NOTE 11/06/2011  Patient:  [name], [name]   Account Number:  [id number]  Date Initiated:  11/06/2011  Documentation initiated by:  Shannan Harper  Subjective/Objective Assessment:   Patient admitted with worsening CHF     Action/Plan:   Discharge to home with [facility], CHF disease management.   Anticipated DC Date:     Anticipated DC Plan:  HOME W HOME HEALTH SERVICES      DC Planning Services  CM consult      Choice offered to / List presented to:             Status of service:  In process, will continue to follow Medicare Important Message given?   (If response is "NO", the following Medicare IM given date fields will be blank) Date Medicare IM given:   Date Additional Medicare IM given:    Discharge Disposition:    Per UR Regulation:  Reviewed for med. necessity/level of care/duration of stay  Comments:  Heart Failure Patient:  UR Completed. 11/06/11 1222 Shannan Harper, RN, BSN

## 2011-11-06 NOTE — Progress Notes (Signed)
   CARE MANAGEMENT NOTE 11/06/2011  Patient:  [name], [name]   Account Number:  [id number]  Date Initiated:  11/06/2011  Documentation initiated by:  Shannan Harper  Subjective/Objective Assessment:   Patient admitted with worsening CHF.     Action/Plan:   Patient refused any discharge planning or support at time of discharged.  Reinforced importance however he did not want.   Anticipated DC Date:  11/08/2011   Anticipated DC Plan:  HOME/SELF CARE      DC Planning Services  CM consult      Choice offered to / List presented to:             Status of service:  In process, will continue to follow Medicare Important Message given?   (If response is "NO", the following Medicare IM given date fields will be blank) Date Medicare IM given:   Date Additional Medicare IM given:    Discharge Disposition:    Per UR Regulation:  Reviewed for med. necessity/level of care/duration of stay  Comments:  Heart Failure Patient:  Met with patient in the presence of his nurse who was documenting.  Patient and I discussed CHF management on discharge with [facility] RN and he refused any support measures at discharge.  He said he was fine and was not interested.  I reinforced the importance of daily weights, salt intake, verified he had a working scale at home, and the zone tool. He did validate understanding on those measures.  Will continue to assist as needed.  11/06/11 1540 Shannan Harper, RN, BSN  UR Completed. 11/06/11 1222 Shannan Harper, RN, BSN

## 2011-11-06 NOTE — Progress Notes (Signed)
Inpatient Diabetes Program Recommendations  AACE/ADA: New Consensus Statement on Inpatient Glycemic Control (2009)  Target Ranges:  Prepandial:   less than 140 mg/dL      Peak postprandial:   less than 180 mg/dL (1-2 hours)      Critically ill patients:  140 - 180 mg/dL   CBGs today: 161/ 096 mg/dl  Inpatient Diabetes Program Recommendations Insulin - Basal: May need to titrate Lantus further.  Home dose 80 units bid per med history.  Home insulin: Lantus 80 units bid Novolog 20 units breakfast/ 25 units lunch/ 25 units dinner

## 2011-11-06 NOTE — Progress Notes (Signed)
Subjective: Pt. Stated he feels better this am, but still SOB and weak.  Fever last night, blood cultures pending.  CXR ? PNA.   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 Claude Manges, [place], [place]). 33 lb weight gain on top of previous hypervolemia (baseline weight probably around 225 lb, peaked at 275 lb, now 258 lb. Lost 17 lb in last 2 weeks on metolazone 5 mg daily and furosemide 80 mg PO bid, but feels worse. Has fallen twice in last couple of days, without syncope or dizziness - weak legs. Hyperglycemia is worse since on steroids.   Objective: Vital signs in last 24 hours: Temp:  [98.3 F (36.8 C)-101.2 F (38.4 C)] 98.3 F (36.8 C) (12/04 0800) Pulse Rate:  [73-80] 80  (12/04 0800) Resp:  [20-26] 20  (12/04 0800) BP: (107-169)/(67-82) 107/67 mmHg (12/04 0800) SpO2:  [95 %-97 %] 96 % (12/04 0800) Weight:  [113.399 kg (250 lb)-115.3 kg (254 lb 3.1 oz)] 250 lb (113.399 kg) (12/04 0506) Weight change:  Last BM Date: 11/03/11 Intake/Output from previous day: -250  (wt down 2 kg since admit) 12/03 0701 - 12/04 0700 In: -  Out: 250 [Urine:250] Intake/Output this shift:    PE: General:Alert Oriented X3, No specific complaints except that the room is too cold. Heart:S1S2, rrr, + murmur Lungs:tight throughout. Diminished breath sounds.  Abd:+bs, soft non tender. Ext:++++ edema from just below knees down. L>R   Lab Results:  [facility] 11/05/11 1923  WBC 15.9*  HGB 12.1*  HCT 37.9*  PLT 148*   BMET  Basename 11/05/11 1923  NA 135  K 3.2*  CL 90*  CO2 33*  GLUCOSE 190*  BUN 58*  CREATININE 2.18*  CALCIUM 9.6   No results found for this basename: TROPONINI:2,CK,MB:2 in the last 72 hours  Lab Results  Component Value  Date   CHOL  Value: 115        ATP III CLASSIFICATION:  <200     mg/dL   Desirable  409-811  mg/dL   Borderline High  >=914    mg/dL   High 78/01/9561   HDL 25* 11/07/2007   LDLCALC  Value: 50        Total Cholesterol/HDL:CHD Risk Coronary Heart Disease Risk Table                     Men   Women  1/2 Average Risk   3.4   3.3 11/07/2007   TRIG 198* 11/07/2007   CHOLHDL 4.6 11/07/2007   Lab Results  Component Value Date   HGBA1C  Value: 9.3 (NOTE) The ADA recommends the following therapeutic goal for glycemic control related to Hgb A1c measurement: Goal of therapy: <6.5 Hgb A1c  Reference: American Diabetes Association: Clinical Practice Recommendations 2010, Diabetes Care, 2010, 33: (Suppl  1).* 12/12/2009     Lab Results  Component Value Date   TSH 1.416 11/05/2011    Hepatic Function Panel  Basename 11/05/11 1923  PROT 6.7  ALBUMIN 2.9*  AST 22  ALT 16  ALKPHOS 101  BILITOT 1.4*  BILIDIR --  IBILI --   No results found for this basename: CHOL in the last 72 hours No results found for this basename: PROTIME in the last 72 hours    EKG:  Orders placed during the hospital encounter of 11/05/11  . EKG 12-LEAD  . EKG 12-LEAD    Studies/Results: X-ray Chest Pa And Lateral  11/06/2011  *RADIOLOGY REPORT*  Clinical Data: Shortness of breath and persistent cough.  CHEST - 2 VIEW  Comparison: Chest radiograph performed 04/04/2010  Findings: The lungs are well-aerated.  Vascular congestion is noted, with increased interstitial markings.  Patchy bilateral opacities are also seen, most prominent at the right midlung zone. Findings raise suspicion for mild pulmonary edema, with question of superimposed pneumonia given clinical concern.  No definite pleural effusion is seen.  No pneumothorax identified.  The cardiomediastinal silhouette is borderline enlarged; the patient is status median sternotomy, with evidence of prior CABG. A pacemaker/AICD is noted at the left chest wall, with leads ending at  the right atrium and right ventricle.  No acute osseous abnormalities are seen.  IMPRESSION: Vascular congestion and borderline cardiomegaly, with increased interstitial markings and patchy bilateral airspace opacities, most prominent at the right midlung zone.  Findings suspicious for mild pulmonary edema, with question of superimposed pneumonia given clinical concern.  Would perform follow-up chest radiograph after treatment and resolution of the acute process, to ensure resolution of the right midlung zone airspace opacity.  Original Report Authenticated By: Tonia Ghent, M.D.    Medications: I have reviewed the patient's current medications.  Assessment/Plan: Patient Active Problem List  Diagnoses  . Heart failure, acute on chronic, systolic and diastolic  . CAD (coronary artery disease)  . Hx of CABG  . ICD (implantable cardiac defibrillator) in place  . Atrial fibrillation with controlled ventricular response  . Diabetes mellitus type 2, insulin dependent  . CKD (chronic kidney disease) stage 4, GFR 15-29 ml/min  . Positive Coombs test  . Anemia due to chronic illness  . HTN (hypertension)  PNA ANTICOAGULATION CHRONIC ATRIAL FIB HYPERGLYCEMIA  PLAN: Prob. PNA with elevated, WBC, CXR results, and fever.  Blood cultures pending.On Rocephin and Zithromax. Hypokalemic, now resolved.  Also combination of CHF, on IV NTG at 10 mcg/min.  Lower ext. Edema significant on IV Lasix 80 mg. BID Cr. 2.18 on admit now 2.22.  Will monitor.  Coumadin, with therapeutic INR. Pharmacy following.  Hyperglycemia:  Will adjust diet to mod. carb with 2 gm Na    LOS: 1 day   Yeraldy Spike R 11/06/2011, 8:27 AM

## 2011-11-06 NOTE — Progress Notes (Signed)
Pt. Seen and examined. Agree with the NP/PA-C note as written.  His weight is decreasing but his creatinine is up a bit. I think he'll benefit from some inotropic support.  Will switch to topical nitrates and add low dose milrinone.  Continue current dose diuretics. Follow strict I's/O's .Marland Kitchen Await results of sputum culture, but clinically, I feel he has a pneumonia with superimposed heart failure or vice versa.  Chrystie Nose, MD Attending Cardiologist [facility]

## 2011-11-06 NOTE — Progress Notes (Signed)
1930 placed a call to Dr . Rennis Golden re : clarifications of Lasix order . Discussed  Earlier that will start low dose milrinone and stop  Lasix because of  Decreasing  Renal function , but lasix was not d'cd . .. Report given also to [facility]  Informing of above.

## 2011-11-06 NOTE — Progress Notes (Signed)
ANTICOAGULATION CONSULT NOTE - Follow Up Consult  Pharmacy Consult for Coumadin Indication: atrial fibrillation  No Known Allergies  Patient Measurements: Height: 6\' 2"  (188 cm) Weight: 250 lb (113.399 kg) (scale c) IBW/kg (Calculated) : 82.2  *  Vital Signs: Temp: 98.3 F (36.8 C) (12/04 0800) Temp src: Oral (12/04 0800) BP: 107/67 mmHg (12/04 0800) Pulse Rate: 80  (12/04 0800)  Labs:  Basename 11/06/11 0605 11/05/11 1923  HGB -- 12.1*  HCT -- 37.9*  PLT -- 148*  APTT -- 47*  LABPROT 24.3* 22.9*  INR 2.14* 1.99*  HEPARINUNFRC -- --  CREATININE 2.22* 2.18*  CKTOTAL -- --  CKMB -- --  TROPONINI -- --   Estimated Creatinine Clearance: 39.1 ml/min (by C-G formula based on Cr of 2.22).   Medications:  Prescriptions prior to admission  Medication Sig Dispense Refill  . allopurinol (ZYLOPRIM) 300 MG tablet Take 450 mg by mouth daily.        . carvedilol (COREG) 25 MG tablet Take 25 mg by mouth 2 (two) times daily with a meal.        . ferrous fumarate (HEMOCYTE - 106 MG FE) 325 (106 FE) MG TABS Take 2 tablets by mouth daily.        . furosemide (LASIX) 40 MG tablet Take 40 mg by mouth 2 (two) times daily.        . insulin aspart (NOVOLOG) 100 UNIT/ML injection Inject 20-25 Units into the skin 3 (three) times daily before meals. Takes 20 units in the morning, 25 units at lunch,  & 25 units in the evening       . insulin glargine (LANTUS) 100 UNIT/ML injection Inject 80 Units into the skin 2 (two) times daily.        Marland Kitchen levothyroxine (SYNTHROID, LEVOTHROID) 50 MCG tablet Take 50 mcg by mouth daily.        . metolazone (ZAROXOLYN) 2.5 MG tablet Take 2.5 mg by mouth daily. Takes 30 minutes prior to furosemide       . omeprazole (PRILOSEC) 20 MG capsule Take 40 mg by mouth daily.        . predniSONE (DELTASONE) 20 MG tablet Take 20 mg by mouth daily.        . simvastatin (ZOCOR) 40 MG tablet Take 40 mg by mouth at bedtime.        Marland Kitchen warfarin (COUMADIN) 5 MG tablet Take 5 mg by  mouth. Takes 5mg  every day except Fridays and takes 2.5mg .  Had 5mg  last on 11-05-11       . warfarin (COUMADIN) 5 MG tablet Take 2.5 mg by mouth. Takes 2.5mg  on Fridays        Scheduled:    . allopurinol  300 mg Oral Daily  . azithromycin  500 mg Oral Daily   Followed by  . azithromycin  250 mg Oral Daily  . carvedilol  25 mg Oral BID WC  . cefTRIAXone (ROCEPHIN)  IV  1 g Intravenous Q24H  . ferrous sulfate  325 mg Oral BID WC  . furosemide  80 mg Intravenous BID  . hydrALAZINE  10 mg Oral Q8H  . insulin aspart  0-15 Units Subcutaneous TID WC  . insulin aspart  15 Units Subcutaneous TID WC  . insulin glargine  80 Units Subcutaneous Q0700  . levalbuterol  0.63 mg Nebulization TID  . levothyroxine  50 mcg Oral Q0700  . metolazone  5 mg Oral Q0700  . pantoprazole  40 mg Oral Q1200  .  potassium chloride  20 mEq Oral TID  . predniSONE  12.5 mg Oral Q0700  . simvastatin  40 mg Oral q1800  . sodium chloride  3 mL Intravenous Q12H  . warfarin  2.5 mg Oral Q Fri-1800  . warfarin  5 mg Oral Custom  . DISCONTD: warfarin  5 mg Oral Once    Assessment: Patient is a 76 y/o male with "permanent" AFib  on chronic warfarin.  Her INR today is 2.14.  Goal of Therapy:  INR 2-3   Plan:  Continue home Coumadin dosing schedule. Coumadin 5mg  po daily except Coumadin 2.5mg  on Friday.  Eriel Dunckel, Elisha Headland, Pharm.D. 11/06/2011 1:33 PM

## 2011-11-07 ENCOUNTER — Inpatient Hospital Stay (HOSPITAL_COMMUNITY): Payer: Medicare Other

## 2011-11-07 DIAGNOSIS — I5023 Acute on chronic systolic (congestive) heart failure: Principal | ICD-10-CM | POA: Diagnosis present

## 2011-11-07 DIAGNOSIS — Z7901 Long term (current) use of anticoagulants: Secondary | ICD-10-CM

## 2011-11-07 DIAGNOSIS — I739 Peripheral vascular disease, unspecified: Secondary | ICD-10-CM | POA: Diagnosis present

## 2011-11-07 DIAGNOSIS — E785 Hyperlipidemia, unspecified: Secondary | ICD-10-CM | POA: Diagnosis present

## 2011-11-07 DIAGNOSIS — I472 Ventricular tachycardia: Secondary | ICD-10-CM | POA: Diagnosis not present

## 2011-11-07 DIAGNOSIS — I255 Ischemic cardiomyopathy: Secondary | ICD-10-CM | POA: Diagnosis present

## 2011-11-07 DIAGNOSIS — I4729 Other ventricular tachycardia: Secondary | ICD-10-CM | POA: Diagnosis not present

## 2011-11-07 LAB — CBC
Hemoglobin: 11.5 g/dL — ABNORMAL LOW (ref 13.0–17.0)
MCH: 28.9 pg (ref 26.0–34.0)
MCHC: 32.7 g/dL (ref 30.0–36.0)
RDW: 18.9 % — ABNORMAL HIGH (ref 11.5–15.5)

## 2011-11-07 LAB — BASIC METABOLIC PANEL
Calcium: 8.8 mg/dL (ref 8.4–10.5)
GFR calc Af Amer: 27 mL/min — ABNORMAL LOW (ref >90)
GFR calc non Af Amer: 23 mL/min — ABNORMAL LOW (ref >90)
Glucose, Bld: 328 mg/dL — ABNORMAL HIGH (ref 70–99)
Potassium: 3.4 mEq/L — ABNORMAL LOW (ref 3.5–5.1)
Sodium: 131 mEq/L — ABNORMAL LOW (ref 135–145)

## 2011-11-07 LAB — GLUCOSE, CAPILLARY
Glucose-Capillary: 223 mg/dL — ABNORMAL HIGH (ref 70–99)
Glucose-Capillary: 67 mg/dL — ABNORMAL LOW (ref 70–99)

## 2011-11-07 LAB — PRO B NATRIURETIC PEPTIDE: Pro B Natriuretic peptide (BNP): 8006 pg/mL — ABNORMAL HIGH (ref 0–125)

## 2011-11-07 LAB — PROTIME-INR: INR: 1.86 — ABNORMAL HIGH (ref 0.00–1.49)

## 2011-11-07 LAB — HEMOGLOBIN A1C: Hgb A1c MFr Bld: 10.6 % — ABNORMAL HIGH (ref <5.7)

## 2011-11-07 MED ORDER — METOLAZONE 10 MG PO TABS
10.0000 mg | ORAL_TABLET | ORAL | Status: DC
  Administered 2011-11-08 – 2011-11-14 (×8): 10 mg via ORAL
  Filled 2011-11-07 (×8): qty 1

## 2011-11-07 MED ORDER — INSULIN GLARGINE 100 UNIT/ML ~~LOC~~ SOLN
40.0000 [IU] | Freq: Every day | SUBCUTANEOUS | Status: DC
  Administered 2011-11-08 – 2011-11-13 (×6): 40 [IU] via SUBCUTANEOUS
  Filled 2011-11-07 (×2): qty 3

## 2011-11-07 MED ORDER — CARVEDILOL 12.5 MG PO TABS
12.5000 mg | ORAL_TABLET | Freq: Two times a day (BID) | ORAL | Status: DC
  Administered 2011-11-07 – 2011-11-12 (×11): 12.5 mg via ORAL
  Filled 2011-11-07 (×12): qty 1

## 2011-11-07 MED ORDER — FUROSEMIDE 10 MG/ML IJ SOLN
8.0000 mg/h | INTRAVENOUS | Status: DC
  Filled 2011-11-07 (×2): qty 25

## 2011-11-07 MED ORDER — FUROSEMIDE 10 MG/ML IJ SOLN
160.0000 mg | Freq: Four times a day (QID) | INTRAVENOUS | Status: DC
  Administered 2011-11-07 – 2011-11-12 (×20): 160 mg via INTRAVENOUS
  Filled 2011-11-07 (×26): qty 16

## 2011-11-07 MED ORDER — WARFARIN VIDEO: Freq: Once | Status: DC

## 2011-11-07 MED ORDER — PATIENT'S GUIDE TO USING COUMADIN BOOK
Freq: Once | Status: AC
  Administered 2011-11-07: 15:00:00
  Filled 2011-11-07: qty 1

## 2011-11-07 MED ORDER — HYDRALAZINE HCL 25 MG PO TABS
25.0000 mg | ORAL_TABLET | Freq: Three times a day (TID) | ORAL | Status: DC
  Filled 2011-11-07 (×3): qty 1

## 2011-11-07 MED ORDER — WARFARIN SODIUM 2.5 MG PO TABS
2.5000 mg | ORAL_TABLET | Freq: Once | ORAL | Status: AC
  Administered 2011-11-07: 2.5 mg via ORAL
  Filled 2011-11-07: qty 1

## 2011-11-07 NOTE — Progress Notes (Signed)
Inpatient Diabetes Program Recommendations  AACE/ADA: New Consensus Statement on Inpatient Glycemic Control (2009)  Target Ranges:  Prepandial:   less than 140 mg/dL      Peak postprandial:   less than 180 mg/dL (1-2 hours)      Critically ill patients:  140 - 180 mg/dL   Reason: Fasting ION=629  Inpatient Diabetes Program Recommendations Insulin - Basal: Titrate Lantus up; home dose is 80 BID  Thanks  Piedad Climes RN, Diabetes Coordinator [phone]

## 2011-11-07 NOTE — Consult Note (Signed)
Referring Provider: No ref. provider found Primary Care Physician:  Colette Ribas, MD Primary Nephrologist:    Reason for Consultation: Acute on Chronic renal failure increased edema  HPI: Patient with severe ischemic cardiomyopathy (EF 25-30%) s/p CABG 2003, OSA, insulin requiring DM, with permanent AF on warfarin and advanced CKD (last serum creat 2.4, estimated GFR <30), recently with severe hypervolemia while receiving tapering steroid therapy for anemia with Coombs+ve test (Dr. Claude Manges, [facility], [place]). 33 lb weight gain on top of previous hypervolemia (baseline weight probably around 225 lb, peaked at 275 lb, now 258 lb. Lost 17 lb in last 2 weeks on metolazone 5 mg daily and furosemide 80 mg PO bid, but feels worse. Has fallen twice in last couple of days, without syncope or dizziness - weak legs. Hyperglycemia is worse since on steroids.   Past Medical History  Diagnosis Date  . Shortness of breath   . Anemia   . Chronic kidney disease     renal insufficiency  . CHF (congestive heart failure)   . Dysrhythmia     atrial fibrilation  . Myocardial infarction   . COPD (chronic obstructive pulmonary disease)   . Diabetes mellitus   . Coronary artery disease     Past Surgical History  Procedure Date  . Coronary artery bypass graft     Prior to Admission medications   Medication Sig Start Date End Date Taking? Authorizing Provider  allopurinol (ZYLOPRIM) 300 MG tablet Take 450 mg by mouth daily.     Yes Historical Provider, MD  carvedilol (COREG) 25 MG tablet Take 25 mg by mouth 2 (two) times daily with a meal.     Yes Historical Provider, MD  ferrous fumarate (HEMOCYTE - 106 MG FE) 325 (106 FE) MG TABS Take 2 tablets by mouth daily.     Yes Historical Provider, MD  furosemide (LASIX) 40 MG tablet Take 40 mg by mouth 2 (two) times daily.     Yes Historical Provider, MD  insulin aspart (NOVOLOG) 100 UNIT/ML injection Inject 20-25 Units into the skin 3 (three) times daily  before meals. Takes 20 units in the morning, 25 units at lunch,  & 25 units in the evening    Yes Historical Provider, MD  insulin glargine (LANTUS) 100 UNIT/ML injection Inject 80 Units into the skin 2 (two) times daily.     Yes Historical Provider, MD  levothyroxine (SYNTHROID, LEVOTHROID) 50 MCG tablet Take 50 mcg by mouth daily.     Yes Historical Provider, MD  metolazone (ZAROXOLYN) 2.5 MG tablet Take 2.5 mg by mouth daily. Takes 30 minutes prior to furosemide    Yes Historical Provider, MD  omeprazole (PRILOSEC) 20 MG capsule Take 40 mg by mouth daily.     Yes Historical Provider, MD  predniSONE (DELTASONE) 20 MG tablet Take 20 mg by mouth daily.     Yes Historical Provider, MD  simvastatin (ZOCOR) 40 MG tablet Take 40 mg by mouth at bedtime.     Yes Historical Provider, MD  warfarin (COUMADIN) 5 MG tablet Take 5 mg by mouth. Takes 5mg  every day except Fridays and takes 2.5mg .  Had 5mg  last on 11-05-11    Yes Historical Provider, MD  warfarin (COUMADIN) 5 MG tablet Take 2.5 mg by mouth. Takes 2.5mg  on Fridays    Yes Historical Provider, MD    Current Facility-Administered Medications  Medication Dose Route Frequency Provider Last Rate Last Dose  . 0.9 %  sodium chloride infusion  250 mL Intravenous PRN  Mihai Croitoru      . acetaminophen (TYLENOL) tablet 650 mg  650 mg Oral Q4H PRN Mihai Croitoru      . acetaminophen (TYLENOL) tablet 650 mg  650 mg Oral Q4H PRN Mihai Croitoru      . allopurinol (ZYLOPRIM) tablet 300 mg  300 mg Oral Daily Mihai Croitoru   300 mg at 11/07/11 1011  . azithromycin (ZITHROMAX) tablet 250 mg  250 mg Oral Daily Mihai Croitoru   250 mg at 11/07/11 1011  . carvedilol (COREG) tablet 12.5 mg  12.5 mg Oral BID WC Garnetta Buddy, MD      . cefTRIAXone (ROCEPHIN) 1 g in dextrose 5 % 50 mL IVPB  1 g Intravenous Q24H Mihai Croitoru   1 g at 11/06/11 2126  . docusate sodium (COLACE) capsule 100 mg  100 mg Oral BID PRN Leone Brand, NP      . ferrous sulfate tablet 325 mg   325 mg Oral BID WC Mihai Croitoru   325 mg at 11/07/11 1610  . furosemide (LASIX) 160 mg in dextrose 5 % 50 mL IVPB  160 mg Intravenous Q6H Garnetta Buddy, MD      . insulin aspart (novoLOG) injection 0-15 Units  0-15 Units Subcutaneous TID WC Leone Brand, NP   5 Units at 11/07/11 1258  . insulin aspart (novoLOG) injection 15 Units  15 Units Subcutaneous TID WC Mihai Croitoru   15 Units at 11/07/11 1258  . insulin glargine (LANTUS) injection 80 Units  80 Units Subcutaneous Q0700 Mihai Croitoru   80 Units at 11/07/11 0614  . levalbuterol (XOPENEX) nebulizer solution 0.63 mg  0.63 mg Nebulization TID Leone Brand, NP   0.63 mg at 11/07/11 0915  . levothyroxine (SYNTHROID, LEVOTHROID) tablet 50 mcg  50 mcg Oral Q0700 Mihai Croitoru   50 mcg at 11/07/11 9604  . metolazone (ZAROXOLYN) tablet 10 mg  10 mg Oral Q0700 Garnetta Buddy, MD      . milrinone [facility]) infusion 200 mcg/mL (0.2 mg/mL)  0.25 mcg/kg/min Intravenous Continuous Lisette Abu. Hilty 8.5 mL/hr at 11/07/11 0639 0.25 mcg/kg/min at 11/07/11 0639  . nitroGLYCERIN (NITROGLYN) 2 % ointment 1 inch  1 inch Topical Q8H Lisette Abu Hilty   1 inch at 11/07/11 0852  . ondansetron (ZOFRAN) injection 4 mg  4 mg Intravenous Q6H PRN Mihai Croitoru      . ondansetron (ZOFRAN) injection 4 mg  4 mg Intravenous Q6H PRN Mihai Croitoru      . pantoprazole (PROTONIX) EC tablet 40 mg  40 mg Oral Q1200 Mihai Croitoru   40 mg at 11/07/11 1258  . potassium chloride SA (K-DUR,KLOR-CON) CR tablet 20 mEq  20 mEq Oral TID Mihai Croitoru   20 mEq at 11/06/11 1834  . predniSONE (DELTASONE) 12.5 mg  12.5 mg Oral Q0700 Mihai Croitoru   12.5 mg at 11/07/11 5409  . simvastatin (ZOCOR) tablet 40 mg  40 mg Oral q1800 Mihai Croitoru   40 mg at 11/06/11 1943  . sodium chloride 0.9 % injection 3 mL  3 mL Intravenous Q12H Mihai Croitoru   3 mL at 11/07/11 1012  . sodium chloride 0.9 % injection 3 mL  3 mL Intravenous PRN Mihai Croitoru      . warfarin (COUMADIN) tablet 2.5 mg   2.5 mg Oral Q Fri-1800 Earle J Stramoski, PHARMD      . warfarin (COUMADIN) tablet 5 mg  5 mg Oral Custom Laurena Bering, PHARMD  5 mg at 11/06/11 1829  . DISCONTD: carvedilol (COREG) tablet 25 mg  25 mg Oral BID WC Mihai Croitoru   25 mg at 11/07/11 0614  . DISCONTD: furosemide (LASIX) 250 mg in dextrose 5 % 250 mL infusion  8 mg/hr Intravenous Continuous Mihai Croitoru      . DISCONTD: furosemide (LASIX) injection 80 mg  80 mg Intravenous BID Mihai Croitoru   80 mg at 11/07/11 0851  . DISCONTD: hydrALAZINE (APRESOLINE) tablet 10 mg  10 mg Oral Q8H Mihai Croitoru   10 mg at 11/07/11 1610  . DISCONTD: hydrALAZINE (APRESOLINE) tablet 25 mg  25 mg Oral Q8H Mihai Croitoru      . DISCONTD: metolazone (ZAROXOLYN) tablet 5 mg  5 mg Oral Q0700 Mihai Croitoru   5 mg at 11/07/11 9604  . DISCONTD: nitroGLYCERIN 0.2 mg/mL in dextrose 5 % infusion  10 mcg/min Intravenous Titrated Mihai Croitoru 3 mL/hr at 11/05/11 2249 10 mcg/min at 11/05/11 2249    Allergies as of 11/05/2011  . (No Known Allergies)    History reviewed. No pertinent family history.  History   Social History  . Marital Status: Divorced    Spouse Name: N/A    Number of Children: N/A  . Years of Education: N/A   Occupational History  . Not on file.   Social History Main Topics  . Smoking status: Former Smoker    Quit date: 11/05/2003  . Smokeless tobacco: Never Used  . Alcohol Use: Yes     occasional  . Drug Use: No  . Sexually Active: No   Other Topics Concern  . Not on file   Social History Narrative  . No narrative on file    Review of Systems: Gen: Denies any fever, chills, sweats,positive anorexia, fatigue, weakness, malaise, weight loss, and sleep disorder HEENT: No visual complaints, No history of Retinopathy. Normal external appearance No Epistaxis or Sore throat. No sinusitis.   CV: Denies chest pain, angina, palpitations, syncope, positive for orthopnea, PND, and increaseing peripheral edema Resp:  dyspnea with exercise,  No cough, sputum, wheezing, coughing up blood, and pleurisy. GI: Denies vomiting blood, jaundice, and fecal incontinence.   Denies dysphagia or odynophagia. GU : Denies urinary burning, blood in urine, urinary frequency, urinary hesitancy, nocturnal urination, and urinary incontinence.  No renal calculi. MS: Denies joint pain, limitation of movement, and swelling, stiffness, low back pain, extremity pain. Denies muscle weakness, cramps, atrophy.  No use of non steroidal antiinflammatory drugs. Derm: Denies rash, itching, dry skin, hives, moles, warts, or unhealing ulcers.  Psych: Denies depression, anxiety, memory loss, suicidal ideation, hallucinations, paranoia, and confusion. Heme: Denies bruising, bleeding, and enlarged lymph nodes. Neuro: No headache.  No diplopia. No dysarthria.  No dysphasia.  No history of CVA.  No Seizures. No paresthesias.  No weakness. Endocrine No DM.  No Thyroid disease.  No Adrenal disease.  Physical Exam: Vital signs in last 24 hours: Temp:  [98.6 F (37 C)-99 F (37.2 C)] 98.6 F (37 C) (12/05 5409) Pulse Rate:  [68-87] 84  (12/05 0611) Resp:  [18-19] 18  (12/05 0611) BP: (115-160)/(62-73) 136/64 mmHg (12/05 0611) SpO2:  [91 %-97 %] 93 % (12/05 0915) Weight:  [113.853 kg (251 lb)] 251 lb (113.853 kg) (12/05 8119) Last BM Date: 11/06/11 General:   Alert,  Well-developed, well-nourished, pleasant and cooperative in NAD Head:  Normocephalic and atraumatic. Eyes:  Sclera clear, no icterus.   Conjunctiva pink. Ears:  Normal auditory acuity. Nose:  No deformity, discharge,  or lesions. Mouth:  No deformity or lesions, dentition normal. Neck:  Supple; no masses or thyromegaly. JVP elevated to jaw Lungs:  Clear throughout to auscultation.   No wheezes, crackles, or rhonchi. No acute distress. Heart:  irregular rate and rhythm; holosystolic murmur 2/22murmurs, clicks, rubs,  s3 gallops. Abdomen:  Soft, nontender and nondistended. No  masses, liver edge 5 cm, no hernias noted. Normal bowel sounds, without guarding, and without rebound.   Msk:  Symmetrical without gross deformities. Normal posture. Pulses:  No carotid, renal, femoral bruits. DP and PT symmetrical and equal Extremities:  Without clubbing ,  4+ edema. Neurologic:  Alert and  oriented x4;  grossly normal neurologically. Skin:  Intact without significant lesions or rashes. Cervical Nodes:  No significant cervical adenopathy. Psych:  Alert and cooperative. Normal mood and affect.  Intake/Output from previous day: 12/04 0701 - 12/05 0700 In: 583 [P.O.:580; I.V.:3] Out: 1001 [Urine:1000; Stool:1] Intake/Output this shift: Total I/O In: 240 [P.O.:240] Out: 500 [Urine:500]  Lab Results:  [facility] 11/07/11 0547 11/05/11 1923  WBC 13.0* 15.9*  HGB 11.5* 12.1*  HCT 35.2* 37.9*  PLT 143* 148*   BMET  Basename 11/07/11 0547 11/06/11 0605 11/05/11 1923  NA 131* 134* 135  K 3.4* 3.6 3.2*  CL 90* 90* 90*  CO2 26 26 33*  GLUCOSE 328* 214* 190*  BUN 72* 59* 58*  CREATININE 2.54* 2.22* 2.18*  CALCIUM 8.8 9.3 9.6  PHOS -- -- --   LFT  Basename 11/05/11 1923  PROT 6.7  ALBUMIN 2.9*  AST 22  ALT 16  ALKPHOS 101  BILITOT 1.4*  BILIDIR --  IBILI --   PT/INR  Basename 11/07/11 0547 11/06/11 0605  LABPROT 21.8* 24.3*  INR 1.86* 2.14*   Hepatitis Panel No results found for this basename: HEPBSAG,HCVAB,HEPAIGM,HEPBIGM in the last 72 hours  Studies/Results: X-ray Chest Pa And Lateral  11/06/2011  *RADIOLOGY REPORT*  Clinical Data: Shortness of breath and persistent cough.  CHEST - 2 VIEW  Comparison: Chest radiograph performed 04/04/2010  Findings: The lungs are well-aerated.  Vascular congestion is noted, with increased interstitial markings.  Patchy bilateral opacities are also seen, most prominent at the right midlung zone. Findings raise suspicion for mild pulmonary edema, with question of superimposed pneumonia given clinical concern.  No  definite pleural effusion is seen.  No pneumothorax identified.  The cardiomediastinal silhouette is borderline enlarged; the patient is status median sternotomy, with evidence of prior CABG. A pacemaker/AICD is noted at the left chest wall, with leads ending at the right atrium and right ventricle.  No acute osseous abnormalities are seen.  IMPRESSION: Vascular congestion and borderline cardiomegaly, with increased interstitial markings and patchy bilateral airspace opacities, most prominent at the right midlung zone.  Findings suspicious for mild pulmonary edema, with question of superimposed pneumonia given clinical concern.  Would perform follow-up chest radiograph after treatment and resolution of the acute process, to ensure resolution of the right midlung zone airspace opacity.  Original Report Authenticated By: Tonia Ghent, M.D.    Assessment/Plan:  Chronic renal insufficiency stage 4 fairly advanced at baseline. Will check urinalysis and renal ultrasound. There is no use of NSAIDS or ACE inhibitors or ARB. Etiology most likely secondary to cardiorenal syndrome of diminished cardiac output.  Volume  Will diurese with IV Lasix and Metolazone. We shall decrease his antihypertensive medications.  Anemia stable  Advanced renal disease and anasarca will attempt to augment his diuresis but may need dialysis.   LOS: 2 Dantonio Justen W @  TODAY@2 :13 PM

## 2011-11-07 NOTE — Progress Notes (Signed)
Subjective:  Still SOB, "no change"  Objective:  Vital Signs in the last 24 hours: Temp:  [98.3 F (36.8 C)-99 F (37.2 C)] 98.6 F (37 C) (12/05 0611) Pulse Rate:  [67-87] 84  (12/05 0611) Resp:  [18-20] 18  (12/05 0611) BP: (115-160)/(62-73) 136/64 mmHg (12/05 0611) SpO2:  [91 %-97 %] 91 % (12/05 0611) Weight:  [113.853 kg (251 lb)] 251 lb (113.853 kg) (12/05 0611)  Intake/Output from previous day: 660 negative since adm  Physical Exam: General appearance: alert, cooperative, no distress and moderately obese Lungs: decreased breath sounds with exp wheezing Heart: irregularly irregular rhythm Extremities: chronic edema and venous changes   Rate: 80  Rhythm: AF with run of NSVT, (ICD did not go off)  Lab Results:  [facility] 11/07/11 0547 11/05/11 1923  WBC 13.0* 15.9*  HGB 11.5* 12.1*  PLT 143* 148*    Basename 11/07/11 0547 11/06/11 0605  NA 131* 134*  K 3.4* 3.6  CL 90* 90*  CO2 26 26  GLUCOSE 328* 214*  BUN 72* 59*  CREATININE 2.54* 2.22*   No results found for this basename: TROPONINI:2,CK,MB:2 in the last 72 hours Hepatic Function Panel  Basename 11/05/11 1923  PROT 6.7  ALBUMIN 2.9*  AST 22  ALT 16  ALKPHOS 101  BILITOT 1.4*  BILIDIR --  IBILI --   No results found for this basename: CHOL in the last 72 hours  Basename 11/07/11 0547  INR 1.86*    Imaging: X-ray Chest Pa And Lateral  11/06/2011  *RADIOLOGY REPORT*  Clinical Data: Shortness of breath and persistent cough.  CHEST - 2 VIEW  Comparison: Chest radiograph performed 04/04/2010  Findings: The lungs are well-aerated.  Vascular congestion is noted, with increased interstitial markings.  Patchy bilateral opacities are also seen, most prominent at the right midlung zone. Findings raise suspicion for mild pulmonary edema, with question of superimposed pneumonia given clinical concern.  No definite pleural effusion is seen.  No pneumothorax identified.  The cardiomediastinal silhouette is  borderline enlarged; the patient is status median sternotomy, with evidence of prior CABG. A pacemaker/AICD is noted at the left chest wall, with leads ending at the right atrium and right ventricle.  No acute osseous abnormalities are seen.  IMPRESSION: Vascular congestion and borderline cardiomegaly, with increased interstitial markings and patchy bilateral airspace opacities, most prominent at the right midlung zone.  Findings suspicious for mild pulmonary edema, with question of superimposed pneumonia given clinical concern.  Would perform follow-up chest radiograph after treatment and resolution of the acute process, to ensure resolution of the right midlung zone airspace opacity.  Original Report Authenticated By: Tonia Ghent, M.D.    Cardiac Studies:  Assessment/Plan:   Principal Problem:  *Acute on chronic systolic heart failure Active Problems:  Pneumonia  CKD (chronic kidney disease) stage 4, GFR 15-29 ml/min  Ischemic cardiomyopathy  Chronic anticoagulation  Non-sustained ventricular tachycardia  CAD (coronary artery disease)  Hx of CABG  ICD (implantable cardiac defibrillator) in place  Atrial fibrillation with controlled ventricular response  Diabetes mellitus type 2, insulin dependent  Positive Coombs test  Anemia due to chronic illness  HTN (hypertension)  Hypokalemia  Dyslipidemia  Peripheral vascular disease, unspecified   Plan-On Milrinone, IV Lasix, steroids, Coumadin and ABs. Consider changing Coreg to metoprolol. Replace K+   Corine Shelter PA-C 11/07/2011, 9:25 AM    I have seen and examined the patient along with Corine Shelter, PA.  I have reviewed the chart, notes and new data.  I  agree with PA's note.  Key new complaints: persistent marked dyspnea; only marginal improvement in diuresis Key examination changes: Remains in anasarca with 4+ pitting edema of the lower extremities and facial edema and probable ascites Key new findings / data: proBNP severely  elevated at 8000 (baseline around 500 using BNP)worsening renal function with a creatinine of 2.5 increased from a baseline of approximately 1.6. Worsening hyponatremia. No evidence of improvement in cardiac performance on milrinone drip.  White blood cell count has decreased and cough has also improved suggesting improvement in respiratory infection.  PLAN: [facility] consult nephrology for possible ultrafiltration. Meanwhile we'll change to a furosemide drip and continue daily metolazone. INR is slightly subtherapeutic may need to begin heparin infusion.  Thurmon Fair, MD, [facility] [phone] 11/07/2011, 10:36 AM

## 2011-11-07 NOTE — Progress Notes (Signed)
ANTICOAGULATION CONSULT NOTE - Follow Up Consult  Pharmacy Consult for Coumadin  Indication: atrial fibrillation   No Known Allergies  Patient Measurements: Height: 6\' 2"  (188 cm) Weight: 251 lb (113.853 kg) IBW/kg (Calculated) : 82.2    Vital Signs: Temp: 98.6 F (37 C) (12/05 0611) Temp src: Oral (12/05 0611) BP: 136/64 mmHg (12/05 0611) Pulse Rate: 84  (12/05 0611)  Labs:  Basename 11/07/11 0547 11/06/11 0605 11/05/11 1923  HGB 11.5* -- 12.1*  HCT 35.2* -- 37.9*  PLT 143* -- 148*  APTT -- -- 47*  LABPROT 21.8* 24.3* 22.9*  INR 1.86* 2.14* 1.99*  HEPARINUNFRC -- -- --  CREATININE 2.54* 2.22* 2.18*  CKTOTAL -- -- --  CKMB -- -- --  TROPONINI -- -- --   Estimated Creatinine Clearance: 34.2 ml/min (by C-G formula based on Cr of 2.54).   Medications:  Scheduled:    . allopurinol  300 mg Oral Daily  . azithromycin  250 mg Oral Daily  . carvedilol  12.5 mg Oral BID WC  . cefTRIAXone (ROCEPHIN)  IV  1 g Intravenous Q24H  . ferrous sulfate  325 mg Oral BID WC  . furosemide  160 mg Intravenous Q6H  . insulin aspart  0-15 Units Subcutaneous TID WC  . insulin aspart  15 Units Subcutaneous TID WC  . insulin glargine  80 Units Subcutaneous Q0700  . levalbuterol  0.63 mg Nebulization TID  . levothyroxine  50 mcg Oral Q0700  . metolazone  10 mg Oral Q0700  . nitroGLYCERIN  1 inch Topical Q8H  . pantoprazole  40 mg Oral Q1200  . patient's guide to using coumadin book   Does not apply Once  . potassium chloride  20 mEq Oral TID  . predniSONE  12.5 mg Oral Q0700  . simvastatin  40 mg Oral q1800  . sodium chloride  3 mL Intravenous Q12H  . warfarin  2.5 mg Oral Q Fri-1800  . warfarin  2.5 mg Oral ONCE-1800  . warfarin  5 mg Oral Custom  . warfarin   Does not apply Once  . DISCONTD: carvedilol  25 mg Oral BID WC  . DISCONTD: furosemide  80 mg Intravenous BID  . DISCONTD: hydrALAZINE  10 mg Oral Q8H  . DISCONTD: hydrALAZINE  25 mg Oral Q8H  . DISCONTD: metolazone  5  mg Oral Q0700   Infusions:    . milrinone 0.25 mcg/kg/min (11/07/11 0639)  . DISCONTD: furosemide (LASIX) infusion    . DISCONTD: nitroGLYCERIN 10 mcg/min (11/05/11 2249)    Assessment: INR subtherapeutic on home dose Coumadin.  Goal of Therapy:  INR 2-3   Plan:  Will give total of 7.5mg  Coumadin today, then resume home schedule.  Trulee Hamstra, Elisha Headland, Pharm.D. 11/07/2011 2:45 PM

## 2011-11-08 DIAGNOSIS — G4730 Sleep apnea, unspecified: Secondary | ICD-10-CM | POA: Diagnosis present

## 2011-11-08 LAB — GLUCOSE, CAPILLARY
Glucose-Capillary: 99 mg/dL (ref 70–99)
Glucose-Capillary: 259 mg/dL — ABNORMAL HIGH (ref 70–99)
Glucose-Capillary: 196 mg/dL — ABNORMAL HIGH (ref 70–99)

## 2011-11-08 LAB — BASIC METABOLIC PANEL
BUN: 77 mg/dL — ABNORMAL HIGH (ref 6–23)
CO2: 30 mEq/L (ref 19–32)
Chloride: 90 mEq/L — ABNORMAL LOW (ref 96–112)
Creatinine, Ser: 2.68 mg/dL — ABNORMAL HIGH (ref 0.50–1.35)

## 2011-11-08 LAB — MAGNESIUM: Magnesium: 2.3 mg/dL (ref 1.5–2.5)

## 2011-11-08 LAB — PRO B NATRIURETIC PEPTIDE: Pro B Natriuretic peptide (BNP): 8355 pg/mL — ABNORMAL HIGH (ref 0–125)

## 2011-11-08 MED ORDER — HYDROCODONE-HOMATROPINE 5-1.5 MG/5ML PO SYRP: 5.0000 mL | ORAL_SOLUTION | ORAL | Status: DC | PRN

## 2011-11-08 MED ORDER — POTASSIUM CHLORIDE CRYS ER 20 MEQ PO TBCR
40.0000 meq | EXTENDED_RELEASE_TABLET | Freq: Once | ORAL | Status: AC
  Administered 2011-11-08: 40 meq via ORAL
  Filled 2011-11-08: qty 2

## 2011-11-08 MED ORDER — POTASSIUM CHLORIDE CRYS ER 20 MEQ PO TBCR
20.0000 meq | EXTENDED_RELEASE_TABLET | Freq: Two times a day (BID) | ORAL | Status: DC
  Filled 2011-11-08 (×3): qty 1

## 2011-11-08 NOTE — Progress Notes (Signed)
Subjective: Interval History: none.  Objective: Vital signs in last 24 hours:  Temp:  [98 F (36.7 C)-101.1 F (38.4 C)] 98 F (36.7 C) (12/06 0803) Pulse Rate:  [75-83] 83  (12/06 0803) Resp:  [20-22] 20  (12/06 0803) BP: (105-172)/(57-74) 148/57 mmHg (12/06 0803) SpO2:  [92 %-99 %] 97 % (12/06 0803) Weight:  [114.397 kg (252 lb 3.2 oz)] 252 lb 3.2 oz (114.397 kg) (12/06 0500)  Weight change: 0.544 kg (1 lb 3.2 oz)  Intake/Output: I/O last 3 completed shifts: In: 1274.9 [P.O.:720; I.V.:304.9; IV Piggyback:250] Out: 2250 [Urine:2250]   Intake/Output this shift:  Total I/O In: 600 [P.O.:600] Out: 425 [Urine:425]  General: Alert, Well-developed, well-nourished, pleasant and cooperative in NAD  Head: Normocephalic and atraumatic.  Eyes: Sclera clear, no icterus. Conjunctiva pink.  Ears: Normal auditory acuity.  Nose: No deformity, discharge, or lesions.  Mouth: No deformity or lesions, dentition normal.  Neck: Supple; no masses or thyromegaly. JVP elevated to jaw  Lungs: Clear throughout to auscultation. No wheezes, crackles, or rhonchi. No acute distress.  Heart: irregular rate and rhythm; holosystolic murmur 2/52murmurs, clicks, rubs, s3 gallops.  Abdomen: Soft, nontender and nondistended. No masses, liver edge 5 cm, no hernias noted. Normal bowel sounds, without guarding, and without rebound.  Msk: Symmetrical without gross deformities. Normal posture.  Pulses: No carotid, renal, femoral bruits. DP and PT symmetrical and equal  Extremities: Without clubbing , 4+ edema.  Neurologic: Alert and oriented x4; grossly normal neurologically.  Skin: Intact without significant lesions or rashes.  Cervical Nodes: No significant cervical adenopathy.  Psych: Alert and cooperative. Normal mood and affect   Lab Results:  [facility] 11/07/11 0547 11/05/11 1923  WBC 13.0* 15.9*  HGB 11.5* 12.1*  HCT 35.2* 37.9*  PLT 143* 148*   BMET  Basename 11/08/11 0605 11/07/11 0547 11/06/11  0605  NA 134* 131* 134*  K PENDING 3.4* 3.6  CL 90* 90* 90*  CO2 30 26 26   GLUCOSE 89 328* 214*  BUN 77* 72* 59*  CREATININE 2.68* 2.54* 2.22*  CALCIUM 9.0 8.8 9.3  PHOS -- -- --   LFT  Basename 11/05/11 1923  PROT 6.7  ALBUMIN 2.9*  AST 22  ALT 16  ALKPHOS 101  BILITOT 1.4*  BILIDIR --  IBILI --   PT/INR  Basename 11/08/11 0605 11/07/11 0547  LABPROT 26.9* 21.8*  INR 2.44* 1.86*   Hepatitis Panel No results found for this basename: HEPBSAG,HCVAB,HEPAIGM,HEPBIGM in the last 72 hours  Studies/Results: US Renal  11/07/2011  *RADIOLOGY REPORT*  Clinical Data: Acute renal failure.  History of hypertension, diabetes and prostate cancer.  RENAL/URINARY TRACT ULTRASOUND COMPLETE  Comparison:  Abdominal CT 03/26/2007.  Findings:  Right Kidney:  There is cortical thinning with mildly increased echogenicity.  No hydronephrosis or focal renal abnormality is identified.  Renal length is 12.0 cm.  Left Kidney:  There is cortical thinning with mildly increased echogenicity.  No hydronephrosis or focal renal abnormality is identified.  Renal length is 12.4 cm.  Bladder:  The bladder appears unremarkable.  The prostate gland protrudes into the bladder base, measuring approximately 4.4 x 3.6 x 4.7 cm.  This is nonspecific in this patient with a reported history of prostate cancer.  The pelvis was not imaged on the prior CT.  IMPRESSION:  1.  Bilateral renal cortical thinning consistent with chronic medical renal disease.  No hydronephrosis. 2.  Nonspecific enlargement of the prostate gland. Correlate clinically.  Original Report Authenticated By: Gerrianne Scale, M.D.  I have reviewed the patient's current medications.  Assessment/Plan: Chronic renal insufficiency stage 4 fairly advanced at baseline. Will check urinalysis and renal ultrasound. There is no use of NSAIDS or ACE inhibitors or ARB. Etiology most likely secondary to cardiorenal syndrome of diminished cardiac output.  Volume  Will diurese with IV Lasix and Metolazone. We shall decrease his antihypertensive medications.  Anemia stable  Most issues i believe are biventricular heart failure and lung fields sound relatively clear. I would continue diuresis today and follow up CXR in AM     LOS: 3 [name] W @TODAY @8 :56 AM

## 2011-11-08 NOTE — Progress Notes (Signed)
ANTICOAGULATION CONSULT NOTE - Follow Up Consult  Pharmacy Consult for Coumadin Indication: atrial fibrillation  No Known Allergies  Patient Measurements: Height: 6\' 2"  (188 cm) Weight: 252 lb 3.2 oz (114.397 kg) (scale c) IBW/kg (Calculated) : 82.2   Vital Signs: Temp: 98 F (36.7 C) (12/06 0803) Temp src: Oral (12/06 0803) BP: 148/57 mmHg (12/06 0803) Pulse Rate: 83  (12/06 0803)  Labs:  Basename 11/08/11 0605 11/07/11 0547 11/06/11 0605 11/05/11 1923  HGB -- 11.5* -- 12.1*  HCT -- 35.2* -- 37.9*  PLT -- 143* -- 148*  APTT -- -- -- 47*  LABPROT 26.9* 21.8* 24.3* --  INR 2.44* 1.86* 2.14* --  HEPARINUNFRC -- -- -- --  CREATININE 2.68* 2.54* 2.22* --  CKTOTAL -- -- -- --  CKMB -- -- -- --  TROPONINI -- -- -- --   Estimated Creatinine Clearance: 32.5 ml/min (by C-G formula based on Cr of 2.68).   Medications:  Scheduled:    . allopurinol  300 mg Oral Daily  . azithromycin  250 mg Oral Daily  . carvedilol  12.5 mg Oral BID WC  . cefTRIAXone (ROCEPHIN)  IV  1 g Intravenous Q24H  . ferrous sulfate  325 mg Oral BID WC  . furosemide  160 mg Intravenous Q6H  . insulin aspart  0-15 Units Subcutaneous TID WC  . insulin aspart  15 Units Subcutaneous TID WC  . insulin glargine  40 Units Subcutaneous QHS  . insulin glargine  80 Units Subcutaneous Q0700  . levalbuterol  0.63 mg Nebulization TID  . levothyroxine  50 mcg Oral Q0700  . metolazone  10 mg Oral Q0700  . nitroGLYCERIN  1 inch Topical Q8H  . pantoprazole  40 mg Oral Q1200  . patient's guide to using coumadin book   Does not apply Once  . potassium chloride  20 mEq Oral BID  . potassium chloride  40 mEq Oral Once  . potassium chloride  40 mEq Oral Once  . predniSONE  12.5 mg Oral Q0700  . simvastatin  40 mg Oral q1800  . sodium chloride  3 mL Intravenous Q12H  . warfarin  2.5 mg Oral Q Fri-1800  . warfarin  2.5 mg Oral ONCE-1800  . warfarin  5 mg Oral Custom  . warfarin   Does not apply Once  . DISCONTD:  carvedilol  25 mg Oral BID WC  . DISCONTD: hydrALAZINE  25 mg Oral Q8H  . DISCONTD: metolazone  5 mg Oral Q0700    Assessment: Atrial Fibrillation:  INR is therapeutic after receiving a boosting dose of Warfarin yesterday.  Goal of Therapy:  INR 2-3   Plan:  Continue his home Warfarin regimen on 5mg  daily except for 2.5mg  on Fridays. Daily PT/INR monitoring for now.  Estella Husk, Pharm.D., BCPS Clinical Pharmacist  Pager [phone] 11/08/2011, 11:35 AM

## 2011-11-08 NOTE — Significant Event (Signed)
CRITICAL VALUE ALERT  Critical value received:  Potassium = 2.6   Date of notification: 11/08/11   Time of notification:  0900  Critical value read back:yes  Nurse who received alert:  Josephina Gip RN BSN  MD notified (1st page):  Corine Shelter PA (verbal notification)   Time of first page:    MD notified (2nd page):  Time of second page:  Responding MD:  Corine Shelter PA  Time MD responded:  [phone]

## 2011-11-08 NOTE — Progress Notes (Signed)
Subjective:  Still SOB, no change  Objective:  Vital Signs in the last 24 hours: Temp:  [98 F (36.7 C)-101.1 F (38.4 C)] 98 F (36.7 C) (12/06 0803) Pulse Rate:  [75-83] 83  (12/06 0803) Resp:  [20-22] 20  (12/06 0803) BP: (105-172)/(57-74) 148/57 mmHg (12/06 0803) SpO2:  [92 %-99 %] 97 % (12/06 0803) Weight:  [114.397 kg (252 lb 3.2 oz)] 252 lb 3.2 oz (114.397 kg) (12/06 0500)  Intake/Output from previous day:  Intake/Output Summary (Last 24 hours) at 11/08/11 0907 Last data filed at 11/08/11 0844  Gross per 24 hour  Intake 1634.87 ml  Output   1875 ml  Net -240.13 ml    Physical Exam:rales at bases Extrem: 3+ edema irr rhythm  Rate: 84  Rhythm: atrial fibrilation, frequent PVC's  And runs of NSVT Lab Results:  [facility] 11/07/11 0547 11/05/11 1923  WBC 13.0* 15.9*  HGB 11.5* 12.1*  PLT 143* 148*    Basename 11/08/11 0605 11/07/11 0547  NA 134* 131*  K 2.6* 3.4*  CL 90* 90*  CO2 30 26  GLUCOSE 89 328*  BUN 77* 72*  CREATININE 2.68* 2.54*   No results found for this basename: TROPONINI:2,CK,MB:2 in the last 72 hours Hepatic Function Panel  Basename 11/05/11 1923  PROT 6.7  ALBUMIN 2.9*  AST 22  ALT 16  ALKPHOS 101  BILITOT 1.4*  BILIDIR --  IBILI --   No results found for this basename: CHOL in the last 72 hours  Basename 11/08/11 0605  INR 2.44*    Imaging: US Renal  11/07/2011  *RADIOLOGY REPORT*  Clinical Data: Acute renal failure.  History of hypertension, diabetes and prostate cancer.  RENAL/URINARY TRACT ULTRASOUND COMPLETE  Comparison:  Abdominal CT 03/26/2007.  Findings:  Right Kidney:  There is cortical thinning with mildly increased echogenicity.  No hydronephrosis or focal renal abnormality is identified.  Renal length is 12.0 cm.  Left Kidney:  There is cortical thinning with mildly increased echogenicity.  No hydronephrosis or focal renal abnormality is identified.  Renal length is 12.4 cm.  Bladder:  The bladder appears unremarkable.   The prostate gland protrudes into the bladder base, measuring approximately 4.4 x 3.6 x 4.7 cm.  This is nonspecific in this patient with a reported history of prostate cancer.  The pelvis was not imaged on the prior CT.  IMPRESSION:  1.  Bilateral renal cortical thinning consistent with chronic medical renal disease.  No hydronephrosis. 2.  Nonspecific enlargement of the prostate gland. Correlate clinically.  Original Report Authenticated By: Gerrianne Scale, M.D.    Cardiac Studies:  Assessment/Plan:   Principal Problem:  *Acute on chronic systolic heart failure, no significant diuresis, wgt up 1kg, BNP up to 8355  Active Problems:  Pneumonia, on AB's  CKD (chronic kidney disease) stage 4, GFR 15-29 ml/min  Ischemic cardiomyopathy, EF 25%  Chronic anticoagulation, INR theraputic  Non-sustained ventricular tachycardia  CAD (coronary artery disease)  Hx of CABG 2003  ICD (implantable cardiac defibrillator) in place  Atrial fibrillation with controlled ventricular response  Diabetes mellitus type 2, insulin dependent  Positive Coombs test  Anemia due to chronic illness  HTN (hypertension)  Hypokalemia  Dyslipidemia  Peripheral vascular disease, unspecified  Sleep apnea, intol to C-Pap   PLan- replace K+. Discussed with Dr Hyman Hopes, continue IV lasix, Metolazone and Milrinone today. Consider dialysis if no significant diuressis in 24hrs.      Corine Shelter PA-C 11/08/2011, 9:07 AM   Tried to disc.  Dialysis but Pt. Would even to listen it. Cont. Med. treatment

## 2011-11-09 ENCOUNTER — Inpatient Hospital Stay (HOSPITAL_COMMUNITY): Payer: Medicare Other

## 2011-11-09 LAB — CULTURE, RESPIRATORY W GRAM STAIN: Culture: NORMAL

## 2011-11-09 LAB — RENAL FUNCTION PANEL
CO2: 30 mEq/L (ref 19–32)
GFR calc Af Amer: 29 mL/min — ABNORMAL LOW (ref >90)
GFR calc non Af Amer: 25 mL/min — ABNORMAL LOW (ref >90)
Glucose, Bld: 234 mg/dL — ABNORMAL HIGH (ref 70–99)
Phosphorus: 4.1 mg/dL (ref 2.3–4.6)
Potassium: 2.8 mEq/L — ABNORMAL LOW (ref 3.5–5.1)
Sodium: 136 mEq/L (ref 135–145)

## 2011-11-09 LAB — PROTIME-INR
INR: 2.67 — ABNORMAL HIGH (ref 0.00–1.49)
Prothrombin Time: 28.9 seconds — ABNORMAL HIGH (ref 11.6–15.2)

## 2011-11-09 LAB — GLUCOSE, CAPILLARY
Glucose-Capillary: 190 mg/dL — ABNORMAL HIGH (ref 70–99)
Glucose-Capillary: 186 mg/dL — ABNORMAL HIGH (ref 70–99)

## 2011-11-09 LAB — CBC
MCHC: 32.6 g/dL (ref 30.0–36.0)
MCV: 87.1 fL (ref 78.0–100.0)
Platelets: 185 10*3/uL (ref 150–400)
RDW: 18.5 % — ABNORMAL HIGH (ref 11.5–15.5)
WBC: 10.9 10*3/uL — ABNORMAL HIGH (ref 4.0–10.5)

## 2011-11-09 LAB — POTASSIUM: Potassium: 3.5 mEq/L (ref 3.5–5.1)

## 2011-11-09 MED ORDER — POTASSIUM CHLORIDE CRYS ER 20 MEQ PO TBCR
20.0000 meq | EXTENDED_RELEASE_TABLET | Freq: Once | ORAL | Status: AC
  Administered 2011-11-09: 20 meq via ORAL

## 2011-11-09 MED ORDER — POTASSIUM CHLORIDE 20 MEQ/15ML (10%) PO LIQD: 20.0000 meq | Freq: Every day | ORAL | Status: DC

## 2011-11-09 MED ORDER — LEVALBUTEROL HCL 0.63 MG/3ML IN NEBU
0.6300 mg | INHALATION_SOLUTION | Freq: Three times a day (TID) | RESPIRATORY_TRACT | Status: DC | PRN
  Filled 2011-11-09: qty 3

## 2011-11-09 MED ORDER — ISOSORB DINITRATE-HYDRALAZINE 20-37.5 MG PO TABS
1.0000 | ORAL_TABLET | Freq: Three times a day (TID) | ORAL | Status: DC
  Administered 2011-11-09 – 2011-11-14 (×14): 1 via ORAL
  Filled 2011-11-09 (×18): qty 1

## 2011-11-09 MED ORDER — POTASSIUM CHLORIDE CRYS ER 20 MEQ PO TBCR: 40.0000 meq | EXTENDED_RELEASE_TABLET | Freq: Once | ORAL | Status: DC

## 2011-11-09 MED ORDER — ISOSORBIDE MONONITRATE ER 30 MG PO TB24
30.0000 mg | ORAL_TABLET | Freq: Every day | ORAL | Status: DC
  Administered 2011-11-09: 30 mg via ORAL
  Filled 2011-11-09: qty 1

## 2011-11-09 MED ORDER — POTASSIUM CHLORIDE 10 MEQ/100ML IV SOLN
10.0000 meq | INTRAVENOUS | Status: AC
  Administered 2011-11-09 (×3): 10 meq via INTRAVENOUS
  Filled 2011-11-09 (×4): qty 100

## 2011-11-09 NOTE — Progress Notes (Signed)
Pt. Seen and examined. Agree with the NP/PA-C note as written.  He is diuresing, albeit slowly. Remains hypertensive for the most part. There is room for afterload reduction. Will start Bidil TID. He continues to be hypokalemic from the diuretics.  Question to renal whether we can safely add spironolactone to counter this, given his reduced GFR. Will continue diuresis per nephrology recs.  Chrystie Nose, MD Attending Cardiologist [facility]

## 2011-11-09 NOTE — Progress Notes (Signed)
Subjective:  Some imrovement in SOB last 24hrs  Objective:  Vital Signs in the last 24 hours: Temp:  [97.5 F (36.4 C)-98.3 F (36.8 C)] 97.6 F (36.4 C) (12/07 7829) Pulse Rate:  [63-81] 81  (12/07 0642) Resp:  [18-22] 18  (12/07 0642) BP: (128-150)/(52-71) 150/71 mmHg (12/07 0642) SpO2:  [93 %-96 %] 94 % (12/07 0642) Weight:  [110.542 kg (243 lb 11.2 oz)] 243 lb 11.2 oz (110.542 kg) (12/07 5621)  Intake/Output from previous day:  Intake/Output Summary (Last 24 hours) at 11/09/11 0835 Last data filed at 11/09/11 0249  Gross per 24 hour  Intake 1500.57 ml  Output   2150 ml  Net -649.43 ml    Physical Exam: General appearance: alert, cooperative, no distress and mildly obese Lungs: few basilar rhonchi Heart: irregularly irregular rhythm Extremities: 2-3 edema bilat   Rate: 75  Rhythm: atrial fibrillation  Lab Results:  Basename 11/09/11 0625 11/07/11 0547  WBC 10.9* 13.0*  HGB 11.7* 11.5*  PLT 185 143*    Basename 11/09/11 0625 11/08/11 0605  NA 136 134*  K 2.8* 2.6*  CL 90* 90*  CO2 30 30  GLUCOSE 234* 89  BUN 80* 77*  CREATININE 2.38* 2.68*   No results found for this basename: TROPONINI:2,CK,MB:2 in the last 72 hours Hepatic Function Panel  Basename 11/09/11 0625  PROT --  ALBUMIN 2.8*  AST --  ALT --  ALKPHOS --  BILITOT --  BILIDIR --  IBILI --   No results found for this basename: CHOL in the last 72 hours  Basename 11/09/11 0625  INR 2.67*    Imaging:Clinical Data: CHF.  CHEST - 2 VIEW  Comparison: 11/06/2011  Findings: Heart is mildly enlarged. Diffuse interstitial prominence and vascular congestion, slightly improved. AICD is unchanged. No effusions.  IMPRESSION: Improving vascular congestion and interstitial prominence.      Cardiac Studies: 2D 11/06/11  Principal Problem:  *Acute on chronic systolic heart failure, Wgt down today 3.5kg, I/O -1074, CXR improving.   Active Problems:  Pneumonia, on AB's  CKD (chronic  kidney disease) stage 4, GFR 15-29 ml/min  Ischemic cardiomyopathy, EF 25% 2D this adm, on Milrinone  Chronic anticoagulation, INR theraputic  Non-sustained ventricular tachycardia  CAD (coronary artery disease)  Hx of CABG 2003  ICD (implantable cardiac defibrillator) in place  Atrial fibrillation with controlled ventricular response  Diabetes mellitus type 2, insulin dependent  Positive Coombs test  Anemia due to chronic illness  HTN (hypertension)  Hypokalemia K+ 2.8 Dyslipidemia  Peripheral vascular disease, unspecified  Sleep apnea, intol to C-Pap   Assessment/Plan:    Plan- continue current Rx. Replace K+. F/U BNP in am. Diuretics per renal service.   Corine Shelter PA-C 11/09/2011, 8:35 AM

## 2011-11-09 NOTE — Progress Notes (Signed)
Subjective: Interval History: none.  Objective: Vital signs in last 24 hours:  Temp:  [97.5 F (36.4 C)-98.3 F (36.8 C)] 97.6 F (36.4 C) (12/07 9147) Pulse Rate:  [63-81] 81  (12/07 0642) Resp:  [18-22] 18  (12/07 0642) BP: (128-150)/(52-71) 150/71 mmHg (12/07 0642) SpO2:  [93 %-96 %] 94 % (12/07 0642) Weight:  [110.542 kg (243 lb 11.2 oz)] 243 lb 11.2 oz (110.542 kg) (12/07 0642)  Weight change: -3.856 kg (-8 lb 8 oz)  Intake/Output: I/O last 3 completed shifts: In: 2055.4 [P.O.:1080; I.V.:475.4; IV Piggyback:500] Out: 3625 [Urine:3625]   Intake/Output this shift:     General: Alert, Well-developed, well-nourished, pleasant and cooperative in NAD  Head: Normocephalic and atraumatic.  Eyes: Sclera clear, no icterus. Conjunctiva pink.  Ears: Normal auditory acuity.  Nose: No deformity, discharge, or lesions.  Mouth: No deformity or lesions, dentition normal.  Neck: Supple; no masses or thyromegaly. JVP elevated to jaw  Lungs: Clear throughout to auscultation. No wheezes, crackles, or rhonchi. No acute distress.  Heart: irregular rate and rhythm; holosystolic murmur 2/80murmurs, clicks, rubs, s3 gallops.  Abdomen: Soft, nontender and nondistended. No masses, liver edge 5 cm, no hernias noted. Normal bowel sounds, without guarding, and without rebound.  Msk: Symmetrical without gross deformities. Normal posture.  Pulses: No carotid, renal, femoral bruits. DP and PT symmetrical and equal  Extremities: Without clubbing , 4+ edema.  Neurologic: Alert and oriented x4; grossly normal neurologically.  Skin: Intact without significant lesions or rashes.  Cervical Nodes: No significant cervical adenopathy.  Psych: Alert and cooperative. Normal mood and affect   Lab Results:  [facility] 11/09/11 0625 11/07/11 0547  WBC 10.9* 13.0*  HGB 11.7* 11.5*  HCT 35.9* 35.2*  PLT 185 143*   BMET  Basename 11/09/11 0625 11/08/11 0605 11/07/11 0547  NA 136 134* 131*  K 2.8* 2.6* 3.4*    CL 90* 90* 90*  CO2 30 30 26   GLUCOSE 234* 89 328*  BUN 80* 77* 72*  CREATININE 2.38* 2.68* 2.54*  CALCIUM 9.3 9.0 8.8  PHOS 4.1 -- --   LFT  Basename 11/09/11 0625  PROT --  ALBUMIN 2.8*  AST --  ALT --  ALKPHOS --  BILITOT --  BILIDIR --  IBILI --   PT/INR  Basename 11/09/11 0625 11/08/11 0605  LABPROT 28.9* 26.9*  INR 2.67* 2.44*   Hepatitis Panel No results found for this basename: HEPBSAG,HCVAB,HEPAIGM,HEPBIGM in the last 72 hours  Studies/Results: Dg Chest 2 View  11/09/2011  *RADIOLOGY REPORT*  Clinical Data: CHF.  CHEST - 2 VIEW  Comparison: 11/06/2011  Findings: Heart is mildly enlarged.  Diffuse interstitial prominence and vascular congestion, slightly improved.  AICD is unchanged.  No effusions.  IMPRESSION: Improving vascular congestion and interstitial prominence.  Original Report Authenticated By: Cyndie Chime, M.D.   US Renal  11/07/2011  *RADIOLOGY REPORT*  Clinical Data: Acute renal failure.  History of hypertension, diabetes and prostate cancer.  RENAL/URINARY TRACT ULTRASOUND COMPLETE  Comparison:  Abdominal CT 03/26/2007.  Findings:  Right Kidney:  There is cortical thinning with mildly increased echogenicity.  No hydronephrosis or focal renal abnormality is identified.  Renal length is 12.0 cm.  Left Kidney:  There is cortical thinning with mildly increased echogenicity.  No hydronephrosis or focal renal abnormality is identified.  Renal length is 12.4 cm.  Bladder:  The bladder appears unremarkable.  The prostate gland protrudes into the bladder base, measuring approximately 4.4 x 3.6 x 4.7 cm.  This is nonspecific  in this patient with a reported history of prostate cancer.  The pelvis was not imaged on the prior CT.  IMPRESSION:  1.  Bilateral renal cortical thinning consistent with chronic medical renal disease.  No hydronephrosis. 2.  Nonspecific enlargement of the prostate gland. Correlate clinically.  Original Report Authenticated By: Gerrianne Scale, M.D.    I have reviewed the patient's current medications.  Assessment/Plan: Chronic renal insufficiency stage 4 fairly advanced at baseline. Will check urinalysis and renal ultrasound. There is no use of NSAIDS or ACE inhibitors or ARB. Etiology most likely secondary to cardiorenal syndrome of diminished cardiac output.  Volume Will diurese with IV Lasix and Metolazone. We shall decrease his antihypertensive medications.  Anemia stable  Most issues i believe are biventricular heart failure and lung fields sound relatively clear. I would continue diuresis. Replete potassium IV and oral. Low due to diuresis      LOS: 4 [name] @TODAY @8 :48 AM

## 2011-11-09 NOTE — Progress Notes (Signed)
UR Completed.   [name] 11/09/2011 [phone]

## 2011-11-09 NOTE — Progress Notes (Signed)
ANTICOAGULATION CONSULT NOTE - Follow Up Consult  Pharmacy Consult for Coumadin Indication: atrial fibrillation  Assessment: 76 yo male on Coumadin PTA for afib. INR therapeutic 2.67. Now restarted on home dose of 5mg  daily except for 2.5mg  on Fridays. No bleeding  noted. Goal of Therapy:  INR 2-3   Plan:  Continue coumadin home dose schedule. Check INR in AM.  Ival Bible 11/09/2011,11:05 AM  No Known Allergies  Patient Measurements: Height: 6\' 2"  (188 cm) Weight: 243 lb 11.2 oz (110.542 kg) (scale c) IBW/kg (Calculated) : 82.2   Vital Signs: Temp: 97.6 F (36.4 C) (12/07 0642) BP: 150/71 mmHg (12/07 0642) Pulse Rate: 81  (12/07 0642)  Labs:  Basename 11/09/11 0625 11/08/11 0605 11/07/11 0547  HGB 11.7* -- 11.5*  HCT 35.9* -- 35.2*  PLT 185 -- 143*  APTT -- -- --  LABPROT 28.9* 26.9* 21.8*  INR 2.67* 2.44* 1.86*  HEPARINUNFRC -- -- --  CREATININE 2.38* 2.68* 2.54*  CKTOTAL -- -- --  CKMB -- -- --  TROPONINI -- -- --   Estimated Creatinine Clearance: 36 ml/min (by C-G formula based on Cr of 2.38).   Medications:  Scheduled:    . allopurinol  300 mg Oral Daily  . azithromycin  250 mg Oral Daily  . carvedilol  12.5 mg Oral BID WC  . cefTRIAXone (ROCEPHIN)  IV  1 g Intravenous Q24H  . ferrous sulfate  325 mg Oral BID WC  . furosemide  160 mg Intravenous Q6H  . insulin aspart  0-15 Units Subcutaneous TID WC  . insulin aspart  15 Units Subcutaneous TID WC  . insulin glargine  40 Units Subcutaneous QHS  . insulin glargine  80 Units Subcutaneous Q0700  . isosorbide mononitrate  30 mg Oral Daily  . levothyroxine  50 mcg Oral Q0700  . metolazone  10 mg Oral Q0700  . pantoprazole  40 mg Oral Q1200  . potassium chloride  10 mEq Intravenous Q1 Hr x 4  . potassium chloride  20 mEq Oral Once  . potassium chloride  40 mEq Oral Once  . predniSONE  12.5 mg Oral Q0700  . simvastatin  40 mg Oral q1800  . sodium chloride  3 mL Intravenous Q12H  . warfarin  2.5 mg  Oral Q Fri-1800  . warfarin  5 mg Oral Custom  . warfarin   Does not apply Once  . DISCONTD: levalbuterol  0.63 mg Nebulization TID  . DISCONTD: nitroGLYCERIN  1 inch Topical Q8H  . DISCONTD: potassium chloride  20 mEq Oral Daily  . DISCONTD: potassium chloride  20 mEq Oral BID  . DISCONTD: potassium chloride  40 mEq Oral Once

## 2011-11-10 LAB — PROTIME-INR
INR: 2.37 — ABNORMAL HIGH (ref 0.00–1.49)
Prothrombin Time: 26.3 seconds — ABNORMAL HIGH (ref 11.6–15.2)

## 2011-11-10 LAB — CBC
HCT: 35.8 % — ABNORMAL LOW (ref 39.0–52.0)
Hemoglobin: 11.9 g/dL — ABNORMAL LOW (ref 13.0–17.0)
MCH: 28.6 pg (ref 26.0–34.0)
MCHC: 33.2 g/dL (ref 30.0–36.0)
MCV: 86.1 fL (ref 78.0–100.0)
Platelets: 195 10*3/uL (ref 150–400)
RBC: 4.16 MIL/uL — ABNORMAL LOW (ref 4.22–5.81)
RDW: 18.1 % — ABNORMAL HIGH (ref 11.5–15.5)
WBC: 11.2 10*3/uL — ABNORMAL HIGH (ref 4.0–10.5)

## 2011-11-10 LAB — BASIC METABOLIC PANEL
BUN: 83 mg/dL — ABNORMAL HIGH (ref 6–23)
Chloride: 87 mEq/L — ABNORMAL LOW (ref 96–112)
GFR calc Af Amer: 28 mL/min — ABNORMAL LOW (ref >90)
Potassium: 2.8 mEq/L — ABNORMAL LOW (ref 3.5–5.1)

## 2011-11-10 LAB — PRO B NATRIURETIC PEPTIDE: Pro B Natriuretic peptide (BNP): 5253 pg/mL — ABNORMAL HIGH (ref 0–125)

## 2011-11-10 LAB — GLUCOSE, CAPILLARY
Glucose-Capillary: 238 mg/dL — ABNORMAL HIGH (ref 70–99)
Glucose-Capillary: 227 mg/dL — ABNORMAL HIGH (ref 70–99)

## 2011-11-10 MED ORDER — POTASSIUM CHLORIDE CRYS ER 20 MEQ PO TBCR
20.0000 meq | EXTENDED_RELEASE_TABLET | Freq: Once | ORAL | Status: AC
  Administered 2011-11-10: 20 meq via ORAL

## 2011-11-10 MED ORDER — POTASSIUM CHLORIDE 10 MEQ/100ML IV SOLN
10.0000 meq | INTRAVENOUS | Status: AC
  Administered 2011-11-10 (×5): 10 meq via INTRAVENOUS
  Filled 2011-11-10 (×5): qty 100

## 2011-11-10 NOTE — Progress Notes (Signed)
ANTICOAGULATION CONSULT NOTE - Follow Up Consult    Pharmacy Consult for Coumadin  Indication: atrial fibrillation   Assessment:  76 yo male on Coumadin PTA for afib. INR therapeutic 2.67. Now restarted on home dose of 5mg  daily except for 2.5mg  on Fridays.  H/H/P stable with no complications observed.    Goal of Therapy:  INR 2-3    Plan:  Continue coumadin home dose schedule.  (Coumadin 5mg  today). Check INR in AM  Tishawn Friedhoff, [employer], Pharm.D. 11/10/2011 3:31 PM

## 2011-11-10 NOTE — Progress Notes (Signed)
[facility]  DAILY PROGRESS NOTE   Subjective:  He is diuresing, slowly .Marland Kitchen Net negative 1L daily .Marland Kitchen Creatinine is improving.  Objective:  Temp:  [97.7 F (36.5 C)-98.6 F (37 C)] 97.9 F (36.6 C) (12/08 0355) Pulse Rate:  [52-73] 73  (12/08 0355) Resp:  [18-19] 19  (12/08 0355) BP: (106-149)/(52-74) 122/52 mmHg (12/08 0355) SpO2:  [94 %-95 %] 94 % (12/08 0355) Weight:  [110.4 kg (243 lb 6.2 oz)] 243 lb 6.2 oz (110.4 kg) (12/08 0355) Weight change: -0.141 kg (-5 oz)  Intake/Output from previous day: 12/07 0701 - 12/08 0700 In: 1734 [P.O.:800; I.V.:434; IV Piggyback:500] Out: 2675 [Urine:2675]  Intake/Output from this shift: Total I/O In: 240 [P.O.:240] Out: 300 [Urine:300]  Medications: Current Facility-Administered Medications  Medication Dose Route Frequency Provider Last Rate Last Dose  . 0.9 %  sodium chloride infusion  250 mL Intravenous PRN Mihai Croitoru 10 mL/hr at 11/09/11 0557 250 mL at 11/09/11 0557  . acetaminophen (TYLENOL) tablet 650 mg  650 mg Oral Q4H PRN Mihai Croitoru   650 mg at 11/07/11 2248  . acetaminophen (TYLENOL) tablet 650 mg  650 mg Oral Q4H PRN Mihai Croitoru      . allopurinol (ZYLOPRIM) tablet 300 mg  300 mg Oral Daily Mihai Croitoru   300 mg at 11/09/11 1038  . azithromycin (ZITHROMAX) tablet 250 mg  250 mg Oral Daily Mihai Croitoru   250 mg at 11/09/11 1038  . carvedilol (COREG) tablet 12.5 mg  12.5 mg Oral BID WC Garnetta Buddy, MD   12.5 mg at 11/10/11 1610  . cefTRIAXone (ROCEPHIN) 1 g in dextrose 5 % 50 mL IVPB  1 g Intravenous Q24H Mihai Croitoru   1 g at 11/09/11 2220  . docusate sodium (COLACE) capsule 100 mg  100 mg Oral BID PRN Leone Brand, NP      . ferrous sulfate tablet 325 mg  325 mg Oral BID WC Mihai Croitoru   325 mg at 11/10/11 9604  . furosemide (LASIX) 160 mg in dextrose 5 % 50 mL IVPB  160 mg Intravenous Q6H Garnetta Buddy, MD   160 mg at 11/10/11 0802  . HYDROcodone-homatropine (HYCODAN) 5-1.5  MG/5ML syrup 5 mL  5 mL Oral Q4H PRN Mihai Croitoru      . insulin aspart (novoLOG) injection 0-15 Units  0-15 Units Subcutaneous TID WC Leone Brand, NP   5 Units at 11/10/11 0630  . insulin aspart (novoLOG) injection 15 Units  15 Units Subcutaneous TID WC Mihai Croitoru   15 Units at 11/10/11 0630  . insulin glargine (LANTUS) injection 40 Units  40 Units Subcutaneous QHS Mihai Croitoru   40 Units at 11/09/11 2200  . insulin glargine (LANTUS) injection 80 Units  80 Units Subcutaneous Q0700 Mihai Croitoru   80 Units at 11/10/11 0628  . isosorbide-hydrALAZINE (BIDIL) 20-37.5 MG per tablet 1 tablet  1 tablet Oral TID Chrystie Nose   1 tablet at 11/09/11 1711  . levalbuterol (XOPENEX) nebulizer solution 0.63 mg  0.63 mg Nebulization Q8H PRN Abelino Derrick, PA      . levothyroxine (SYNTHROID, LEVOTHROID) tablet 50 mcg  50 mcg Oral Q0700 Mihai Croitoru   50 mcg at 11/10/11 5409  . metolazone (ZAROXOLYN) tablet 10 mg  10 mg Oral Q0700 Garnetta Buddy, MD   10 mg at 11/10/11 8119  . milrinone (PRIMACOR) infusion 200 mcg/mL (0.2 mg/mL)  0.25 mcg/kg/min Intravenous Continuous Lisette Abu. Shariya Gaster 8.5  mL/hr at 11/10/11 0837 0.25 mcg/kg/min at 11/10/11 0837  . ondansetron (ZOFRAN) injection 4 mg  4 mg Intravenous Q6H PRN Mihai Croitoru      . ondansetron (ZOFRAN) injection 4 mg  4 mg Intravenous Q6H PRN Mihai Croitoru      . pantoprazole (PROTONIX) EC tablet 40 mg  40 mg Oral Q1200 Mihai Croitoru   40 mg at 11/08/11 1139  . potassium chloride 10 mEq in 100 mL IVPB  10 mEq Intravenous Q1 Hr x 4 Garnetta Buddy, MD   10 mEq at 11/09/11 1358  . predniSONE (DELTASONE) 12.5 mg  12.5 mg Oral Q0700 Mihai Croitoru   12.5 mg at 11/10/11 4098  . simvastatin (ZOCOR) tablet 40 mg  40 mg Oral q1800 Mihai Croitoru   40 mg at 11/09/11 1711  . sodium chloride 0.9 % injection 3 mL  3 mL Intravenous Q12H Mihai Croitoru   3 mL at 11/09/11 1043  . sodium chloride 0.9 % injection 3 mL  3 mL Intravenous PRN Mihai Croitoru      .  warfarin (COUMADIN) tablet 2.5 mg  2.5 mg Oral Q Fri-1800 Laurena Bering, PHARMD   2.5 mg at 11/09/11 1711  . warfarin (COUMADIN) tablet 5 mg  5 mg Oral Custom Laurena Bering, PHARMD   5 mg at 11/08/11 1712  . warfarin (COUMADIN) video   Does not apply Once Laurena Bering, PHARMD      . DISCONTD: isosorbide mononitrate (IMDUR) 24 hr tablet 30 mg  30 mg Oral Daily Abelino Derrick, [place]   30 mg at 11/09/11 1038    Physical Exam: General appearance: alert, no distress, moderately obese and slowed mentation Neck: no adenopathy, no carotid bruit, JVP to 9 cmH2), supple, symmetrical, trachea midline and thyroid not enlarged, symmetric, no tenderness/mass/nodules, neck is thick Lungs: rales bibasilar Heart: regular rate and rhythm, S1, S2 normal, no murmur, click, rub or gallop Abdomen: soft, non-tender; bowel sounds normal; no masses,  no organomegaly and pre-sacral edema Extremities: edema 3-4+ bilateral edema Pulses: 2+ and symmetric Skin: Bilateral chronic venous stasis changes Neurologic: Grossly normal  Lab Results: Results for orders placed during the hospital encounter of 11/05/11 (from the past 48 hour(s))  GLUCOSE, CAPILLARY     Status: Abnormal   Collection Time   11/08/11 11:35 AM      Component Value Range Comment   Glucose-Capillary 306 (*) 70 - 99 (mg/dL)    Comment 1 Notify RN      Comment 2 Documented in Chart     GLUCOSE, CAPILLARY     Status: Abnormal   Collection Time   11/08/11  4:30 PM      Component Value Range Comment   Glucose-Capillary 259 (*) 70 - 99 (mg/dL)    Comment 1 Notify RN      Comment 2 Documented in Chart     GLUCOSE, CAPILLARY     Status: Abnormal   Collection Time   11/08/11  9:17 PM      Component Value Range Comment   Glucose-Capillary 196 (*) 70 - 99 (mg/dL)    Comment 1 Notify RN      Comment 2 Documented in Chart     GLUCOSE, CAPILLARY     Status: Abnormal   Collection Time   11/09/11  5:54 AM      Component Value Range Comment    Glucose-Capillary 226 (*) 70 - 99 (mg/dL)   RENAL FUNCTION PANEL     Status:  Abnormal   Collection Time   11/09/11  6:25 AM      Component Value Range Comment   Sodium 136  135 - 145 (mEq/L)    Potassium 2.8 (*) 3.5 - 5.1 (mEq/L)    Chloride 90 (*) 96 - 112 (mEq/L)    CO2 30  19 - 32 (mEq/L)    Glucose, Bld 234 (*) 70 - 99 (mg/dL)    BUN 80 (*) 6 - 23 (mg/dL)    Creatinine, Ser 1.61 (*) 0.50 - 1.35 (mg/dL)    Calcium 9.3  8.4 - 10.5 (mg/dL)    Phosphorus 4.1  2.3 - 4.6 (mg/dL)    Albumin 2.8 (*) 3.5 - 5.2 (g/dL)    GFR calc non Af Amer 25 (*) >90 (mL/min)    GFR calc Af Amer 29 (*) >90 (mL/min)   CBC     Status: Abnormal   Collection Time   11/09/11  6:25 AM      Component Value Range Comment   WBC 10.9 (*) 4.0 - 10.5 (K/uL)    RBC 4.12 (*) 4.22 - 5.81 (MIL/uL)    Hemoglobin 11.7 (*) 13.0 - 17.0 (g/dL)    HCT 09.6 (*) 04.5 - 52.0 (%)    MCV 87.1  78.0 - 100.0 (fL)    MCH 28.4  26.0 - 34.0 (pg)    MCHC 32.6  30.0 - 36.0 (g/dL)    RDW 40.9 (*) 81.1 - 15.5 (%)    Platelets 185  150 - 400 (K/uL)   PROTIME-INR     Status: Abnormal   Collection Time   11/09/11  6:25 AM      Component Value Range Comment   Prothrombin Time 28.9 (*) 11.6 - 15.2 (seconds)    INR 2.67 (*) 0.00 - 1.49    GLUCOSE, CAPILLARY     Status: Abnormal   Collection Time   11/09/11 11:11 AM      Component Value Range Comment   Glucose-Capillary 190 (*) 70 - 99 (mg/dL)    Comment 1 Documented in Chart      Comment 2 Notify RN     POTASSIUM     Status: Normal   Collection Time   11/09/11  3:31 PM      Component Value Range Comment   Potassium 3.5  3.5 - 5.1 (mEq/L)   GLUCOSE, CAPILLARY     Status: Abnormal   Collection Time   11/09/11  4:12 PM      Component Value Range Comment   Glucose-Capillary 214 (*) 70 - 99 (mg/dL)   GLUCOSE, CAPILLARY     Status: Abnormal   Collection Time   11/09/11  9:52 PM      Component Value Range Comment   Glucose-Capillary 186 (*) 70 - 99 (mg/dL)   PRO B NATRIURETIC PEPTIDE      Status: Abnormal   Collection Time   11/10/11  5:00 AM      Component Value Range Comment   BNP, POC 5253.0 (*) 0 - 125 (pg/mL)   GLUCOSE, CAPILLARY     Status: Abnormal   Collection Time   11/10/11  6:08 AM      Component Value Range Comment   Glucose-Capillary 238 (*) 70 - 99 (mg/dL)   PROTIME-INR     Status: Abnormal   Collection Time   11/10/11  6:45 AM      Component Value Range Comment   Prothrombin Time 26.3 (*) 11.6 - 15.2 (seconds)    INR  2.37 (*) 0.00 - 1.49    BASIC METABOLIC PANEL     Status: Abnormal   Collection Time   11/10/11  6:45 AM      Component Value Range Comment   Sodium 133 (*) 135 - 145 (mEq/L)    Potassium 2.8 (*) 3.5 - 5.1 (mEq/L)    Chloride 87 (*) 96 - 112 (mEq/L)    CO2 28  19 - 32 (mEq/L)    Glucose, Bld 221 (*) 70 - 99 (mg/dL)    BUN 83 (*) 6 - 23 (mg/dL)    Creatinine, Ser 9.14 (*) 0.50 - 1.35 (mg/dL)    Calcium 9.6  8.4 - 10.5 (mg/dL)    GFR calc non Af Amer 24 (*) >90 (mL/min)    GFR calc Af Amer 28 (*) >90 (mL/min)   CBC     Status: Abnormal   Collection Time   11/10/11  6:45 AM      Component Value Range Comment   WBC 11.2 (*) 4.0 - 10.5 (K/uL)    RBC 4.16 (*) 4.22 - 5.81 (MIL/uL)    Hemoglobin 11.9 (*) 13.0 - 17.0 (g/dL)    HCT 78.2 (*) 95.6 - 52.0 (%)    MCV 86.1  78.0 - 100.0 (fL)    MCH 28.6  26.0 - 34.0 (pg)    MCHC 33.2  30.0 - 36.0 (g/dL)    RDW 21.3 (*) 08.6 - 15.5 (%)    Platelets 195  150 - 400 (K/uL)     Imaging: Dg Chest 2 View  11/09/2011  *RADIOLOGY REPORT*  Clinical Data: CHF.  CHEST - 2 VIEW  Comparison: 11/06/2011  Findings: Heart is mildly enlarged.  Diffuse interstitial prominence and vascular congestion, slightly improved.  AICD is unchanged.  No effusions.  IMPRESSION: Improving vascular congestion and interstitial prominence.  Original Report Authenticated By: Cyndie Chime, M.D.    Assessment:  1. Principal Problem: 2.  *Acute on chronic systolic heart failure 3. Active Problems: 4.  CAD (coronary artery  disease) 5.  Hx of CABG x 6 2003 6.  ICD (implantable cardiac defibrillator) in place 7.  Atrial fibrillation with controlled ventricular response 8.  Diabetes mellitus type 2, insulin dependent 9.  CKD (chronic kidney disease) stage 4, GFR 15-29 ml/min 10.  Positive Coombs test 11.  Anemia due to chronic illness 12.  HTN (hypertension) 13.  Pneumonia 14.  Hypokalemia 15.  Ischemic cardiomyopathy, EF 25% 16.  Peripheral vascular disease, unspecified 17.  Dyslipidemia 18.  Chronic anticoagulation 19.  Non-sustained ventricular tachycardia 20.  Sleep apnea, C-Pap intol 21.   Plan:  1. Slow diuresis, but improving renal function on current regimen. 2. Continue milrinone, lasix and metolaxone per renal. 3. Social work consult for home situation, ?safe for discharge. 4. Fit bilateral compression stockings for LE venous stasis.  Time Spent Directly with Patient:  30 minutes  Length of Stay:  LOS: 5 days   Chrystie Nose, MD Attending Cardiologist [facility]  Safira Proffit C 11/10/2011, 9:40 AM

## 2011-11-10 NOTE — Progress Notes (Signed)
Corine Shelter PA notified of client's frequent PVC's and runs of SVT, VT.  Asymptomatic.  K+ today 2.8 and runs of potassium going as ordered.  IV's restarted during potassium infusion.

## 2011-11-10 NOTE — Progress Notes (Signed)
Subjective: Interval History: none. Feels better  Objective: Vital signs in last 24 hours:  Temp:  [97.7 F (36.5 C)-98.6 F (37 C)] 97.9 F (36.6 C) (12/08 0355) Pulse Rate:  [52-73] 73  (12/08 0355) Resp:  [18-19] 19  (12/08 0355) BP: (106-149)/(52-74) 122/52 mmHg (12/08 0355) SpO2:  [94 %-95 %] 94 % (12/08 0355) Weight:  [110.4 kg (243 lb 6.2 oz)] 243 lb 6.2 oz (110.4 kg) (12/08 0355)  Weight change: -0.141 kg (-5 oz)  Intake/Output: I/O last 3 completed shifts: In: 2154.5 [P.O.:800; I.V.:604.5; IV Piggyback:750] Out: 3375 [Urine:3375]   Intake/Output this shift:  Total I/O In: 360 [P.O.:360] Out: 300 [Urine:300]  General: Alert, Well-developed, well-nourished, pleasant and cooperative in NAD  Head: Normocephalic and atraumatic.   Neck: Supple; no masses or thyromegaly. JVP elevated to jaw  Lungs: Clear throughout to auscultation. No wheezes, crackles, or rhonchi. No acute distress.  Heart: irregular rate and rhythm; holosystolic murmur 2/48murmurs, clicks, rubs, s3 gallops.  Abdomen: Soft, nontender and nondistended. No masses, liver edge 5 cm, no hernias noted. Normal bowel sounds, without guarding, and without rebound.  Extremities: Without clubbing , 4+ edema.  Neurologic: Alert and oriented x4; grossly normal neurologically.     Lab Results:  [facility] 11/10/11 0645 11/09/11 0625  WBC 11.2* 10.9*  HGB 11.9* 11.7*  HCT 35.8* 35.9*  PLT 195 185   BMET  Basename 11/10/11 0645 11/09/11 1531 11/09/11 0625 11/08/11 0605  NA 133* -- 136 134*  K 2.8* 3.5 2.8* --  CL 87* -- 90* 90*  CO2 28 -- 30 30  GLUCOSE 221* -- 234* 89  BUN 83* -- 80* 77*  CREATININE 2.49* -- 2.38* 2.68*  CALCIUM 9.6 -- 9.3 9.0  PHOS -- -- 4.1 --   LFT  Basename 11/09/11 0625  PROT --  ALBUMIN 2.8*  AST --  ALT --  ALKPHOS --  BILITOT --  BILIDIR --  IBILI --   PT/INR  Basename 11/10/11 0645 11/09/11 0625  LABPROT 26.3* 28.9*  INR 2.37* 2.67*   Hepatitis Panel No results  found for this basename: HEPBSAG,HCVAB,HEPAIGM,HEPBIGM in the last 72 hours  Studies/Results: Dg Chest 2 View  11/09/2011  *RADIOLOGY REPORT*  Clinical Data: CHF.  CHEST - 2 VIEW  Comparison: 11/06/2011  Findings: Heart is mildly enlarged.  Diffuse interstitial prominence and vascular congestion, slightly improved.  AICD is unchanged.  No effusions.  IMPRESSION: Improving vascular congestion and interstitial prominence.  Original Report Authenticated By: Cyndie Chime, M.D.    I have reviewed the patient's current medications.  Assessment/Plan: Chronic renal insufficiency stage 4 fairly advanced at baseline. .  Etiology most likely secondary to cardiorenal syndrome of diminished cardiac output. Volume Will continue to diurese with IV Lasix and Metolazone. We shall decrease his antihypertensive medications. Anemia stable Hypokalemia will replete   Most issues i believe are biventricular heart failure and lung fields sound relatively clear. I would continue diuresis. Replete potassium IV and oral. Low due to diuresis      LOS: 5 Tiffany Calmes W @TODAY @10 :21 AM

## 2011-11-11 LAB — POTASSIUM: Potassium: 3.5 mEq/L (ref 3.5–5.1)

## 2011-11-11 LAB — GLUCOSE, CAPILLARY
Glucose-Capillary: 169 mg/dL — ABNORMAL HIGH (ref 70–99)
Glucose-Capillary: 149 mg/dL — ABNORMAL HIGH (ref 70–99)

## 2011-11-11 LAB — MAGNESIUM: Magnesium: 2.4 mg/dL (ref 1.5–2.5)

## 2011-11-11 LAB — RENAL FUNCTION PANEL
Albumin: 2.9 g/dL — ABNORMAL LOW (ref 3.5–5.2)
Calcium: 9.3 mg/dL (ref 8.4–10.5)
Creatinine, Ser: 2.88 mg/dL — ABNORMAL HIGH (ref 0.50–1.35)
GFR calc non Af Amer: 20 mL/min — ABNORMAL LOW (ref >90)

## 2011-11-11 LAB — PROTIME-INR: INR: 2.16 — ABNORMAL HIGH (ref 0.00–1.49)

## 2011-11-11 MED ORDER — POTASSIUM CHLORIDE CRYS ER 20 MEQ PO TBCR
40.0000 meq | EXTENDED_RELEASE_TABLET | Freq: Once | ORAL | Status: AC
  Administered 2011-11-11: 40 meq via ORAL

## 2011-11-11 MED ORDER — POTASSIUM CHLORIDE 10 MEQ/100ML IV SOLN
10.0000 meq | INTRAVENOUS | Status: AC
  Administered 2011-11-11 (×5): 10 meq via INTRAVENOUS
  Filled 2011-11-11 (×5): qty 100

## 2011-11-11 MED ORDER — POTASSIUM CHLORIDE CRYS ER 20 MEQ PO TBCR
20.0000 meq | EXTENDED_RELEASE_TABLET | Freq: Two times a day (BID) | ORAL | Status: DC
  Administered 2011-11-11 – 2011-11-12 (×2): 20 meq via ORAL
  Filled 2011-11-11 (×2): qty 1

## 2011-11-11 MED ORDER — POTASSIUM CHLORIDE CRYS ER 20 MEQ PO TBCR
40.0000 meq | EXTENDED_RELEASE_TABLET | Freq: Once | ORAL | Status: AC
  Administered 2011-11-11: 40 meq via ORAL
  Filled 2011-11-11: qty 2

## 2011-11-11 NOTE — Progress Notes (Signed)
Subjective:  No increased SOB today  Objective:  Vital Signs in the last 24 hours: Temp:  [97.9 F (36.6 C)] 97.9 F (36.6 C) (12/09 0327) Pulse Rate:  [54-69] 69  (12/09 0327) Resp:  [20-21] 20  (12/09 0327) BP: (116-125)/(60-63) 116/63 mmHg (12/09 0327) SpO2:  [93 %-95 %] 95 % (12/09 0327) Weight:  [111.4 kg (245 lb 9.5 oz)] 245 lb 9.5 oz (111.4 kg) (12/09 0327)  Intake/Output from previous day:  Intake/Output Summary (Last 24 hours) at 11/11/11 0844 Last data filed at 11/11/11 0332  Gross per 24 hour  Intake 1300.87 ml  Output   1676 ml  Net -375.13 ml    Physical Exam: General appearance: alert, cooperative and no distress Lungs: clear to auscultation bilaterally Heart: irregularly irregular rhythm 2+ LE edema   Rate: 78  Rhythm: atrial fibrillation and frequent PVC's and NSVT runs  Lab Results:  [facility] 11/10/11 0645 11/09/11 0625  WBC 11.2* 10.9*  HGB 11.9* 11.7*  PLT 195 185    Basename 11/11/11 0555 11/10/11 0645  NA 137 133*  K 2.6* 2.8*  CL 90* 87*  CO2 30 28  GLUCOSE 143* 221*  BUN 89* 83*  CREATININE 2.88* 2.49*   No results found for this basename: TROPONINI:2,CK,MB:2 in the last 72 hours Hepatic Function Panel  Basename 11/11/11 0555  PROT --  ALBUMIN 2.9*  AST --  ALT --  ALKPHOS --  BILITOT --  BILIDIR --  IBILI --   No results found for this basename: CHOL in the last 72 hours  Basename 11/11/11 0555  INR 2.16*    Imaging: No results found.  Cardiac Studies:  Assessment/Plan:   Principal Problem:  *Acute on chronic systolic heart failure  Active Problems:  CKD (chronic kidney disease) stage 4, GFR 15-29 ml/min  Ischemic cardiomyopathy, EF 25%  Chronic anticoagulation  Non-sustained ventricular tachycardia  CAD (coronary artery disease)  Hx of CABG x 6 2003  ICD (implantable cardiac defibrillator) in place  Atrial fibrillation with controlled ventricular response  Diabetes mellitus type 2, insulin dependent  Positive Coombs test  Anemia due to chronic illness  HTN (hypertension)  Hypokalemia, K+ 2.6 today  Dyslipidemia  Sleep apnea, C-Pap intol  Peripheral vascular disease, unspecified  Plan-Replace K+ orally, (problems with IV replacement yesterday), and start BID K+. Will defer diuretics to renal service. ?Continue Milrinone, will discuss with MD. Check serum K+ this afternoon. He has completed course of Zithromax, will d/c Rocephin, (no evidence of pneumonia).    Corine Shelter PA-C 11/11/2011, 8:44 AM

## 2011-11-11 NOTE — Progress Notes (Signed)
  Pharmacy Consult for Coumadin  Indication: atrial fibrillation   Assessment:  76 yo male on Coumadin PTA for afib. INR therapeutic 2.16 Now restarted on home dose of 5mg  daily except for 2.5mg  on Fridays.  H/H/P stable with no complications observed.   Goal of Therapy:  INR 2-3   Plan:  Continue coumadin home dose schedule.  (Coumadin 5mg  today).  Check INR in AM  Clarice Bonaventure, [employer], Pharm.D. 11/11/2011 3:08 PM

## 2011-11-11 NOTE — Progress Notes (Addendum)
Patient had a 13 beat run of V- Tach. He is asymptomatic. He is getting out of bed and moving to the chair at the time of the episode. Will continue to monitor and notify physician.   Notified [employer]. New orders received.

## 2011-11-11 NOTE — Progress Notes (Signed)
Subjective: Interval History: has no complaint of SOB, patinent still diuresing.  Objective: Vital signs in last 24 hours:  Temp:  [97.9 F (36.6 C)] 97.9 F (36.6 C) (12/09 0327) Pulse Rate:  [54-69] 69  (12/09 0327) Resp:  [20-21] 20  (12/09 0327) BP: (116-125)/(60-63) 116/63 mmHg (12/09 0327) SpO2:  [93 %-95 %] 95 % (12/09 0327) Weight:  [111.4 kg (245 lb 9.5 oz)] 245 lb 9.5 oz (111.4 kg) (12/09 0327)  Weight change: 1 kg (2 lb 3.3 oz)  Intake/Output: I/O last 3 completed shifts: In: 2179.8 [P.O.:1040; I.V.:339.8; IV Piggyback:800] Out: 3551 [Urine:3550; Stool:1]   Intake/Output this shift:  Total I/O In: 240 [P.O.:240] Out: -   General: Alert, Well-developed, well-nourished, pleasant and cooperative in NAD  Head: Normocephalic and atraumatic.  Neck: Supple; no masses or thyromegaly. JVP elevated to jaw  Lungs: Clear throughout to auscultation. No wheezes, crackles, or rhonchi. No acute distress.  Heart: irregular rate and rhythm; holosystolic murmur 2/27murmurs, clicks, rubs, s3 gallops.  Abdomen: Soft, nontender and nondistended. No masses, liver edge 5 cm, no hernias noted. Normal bowel sounds, without guarding, and without rebound.  Extremities: Without clubbing , 4+ edema.  Neurologic: Alert and oriented x4; grossly normal neurologically.    Lab Results:  [facility] 11/10/11 0645 11/09/11 0625  WBC 11.2* 10.9*  HGB 11.9* 11.7*  HCT 35.8* 35.9*  PLT 195 185   BMET  Basename 11/11/11 0555 11/10/11 0645 11/09/11 1531 11/09/11 0625  NA 137 133* -- 136  K 2.6* 2.8* 3.5 --  CL 90* 87* -- 90*  CO2 30 28 -- 30  GLUCOSE 143* 221* -- 234*  BUN 89* 83* -- 80*  CREATININE 2.88* 2.49* -- 2.38*  CALCIUM 9.3 9.6 -- 9.3  PHOS 4.3 -- -- 4.1   LFT  Basename 11/11/11 0555  PROT --  ALBUMIN 2.9*  AST --  ALT --  ALKPHOS --  BILITOT --  BILIDIR --  IBILI --   PT/INR  Basename 11/11/11 0555 11/10/11 0645  LABPROT 24.5* 26.3*  INR 2.16* 2.37*   Hepatitis  Panel No results found for this basename: HEPBSAG,HCVAB,HEPAIGM,HEPBIGM in the last 72 hours  Studies/Results: No results found.  I have reviewed the patient's current medications.  Assessment/Plan: Chronic renal insufficiency stage 4 fairly advanced at baseline. . Etiology most likely secondary to cardiorenal syndrome of diminished cardiac output.  Volume Will continue to diurese with IV Lasix and Metolazone. We shall decrease his antihypertensive medications. Anemia stable  Hypokalemia will replete IV and PO  I Agree with Dr Rennis Golden that dialysis may be the only option. However the hypotension related to his RV Failure is going to pose a problem with hemodynamic instability. Patient so far refuses to entertain dialysis as an option and therefore it seems palliative care may need to be discussed with family and patient. I don not see transitioning to PO diuretics would be a good idea at this point, and would continue IV at least for the next 24hours.    LOS: 6 [name] @TODAY @10 :02 AM

## 2011-11-11 NOTE — Progress Notes (Signed)
Informed Corine Shelter, [place] of Critical lab of K+ level 2.6.  New orders to come.

## 2011-11-11 NOTE — Progress Notes (Addendum)
Pt. Seen and examined. Agree with the NP/PA-C note as written.  Urine output has declined. Creatinine is rising. Remains hypokalemic. He still has ~20 lbs of fluid and is not diuresing despite high dose diuretics. I agree with discontinuing antibiotics.  We may have to stop his milrinone. To me, dialysis and ultrafiltration seems like the only option.  The patient is adamant he does not want dialysis.  Would appreciate neprhology recommendations regarding oral diuretic regimen - bumex or torsemide + zaroxolyn? Again, will likely need to discontinue milrinone soon. Will have to work for discharge and may need to investigate home health needs as he will likely decline in the near future.  Chrystie Nose, MD Attending Cardiologist [facility]

## 2011-11-11 NOTE — Progress Notes (Signed)
Pt requested assistance with removing his TED hose. He states he cannot tolerate the compression stockings.

## 2011-11-12 LAB — RENAL FUNCTION PANEL
Albumin: 3 g/dL — ABNORMAL LOW (ref 3.5–5.2)
CO2: 30 mEq/L (ref 19–32)
Calcium: 9.5 mg/dL (ref 8.4–10.5)
Creatinine, Ser: 2.82 mg/dL — ABNORMAL HIGH (ref 0.50–1.35)
GFR calc Af Amer: 24 mL/min — ABNORMAL LOW (ref >90)
GFR calc non Af Amer: 21 mL/min — ABNORMAL LOW (ref >90)
Sodium: 139 mEq/L (ref 135–145)

## 2011-11-12 LAB — POTASSIUM: Potassium: 3.6 mEq/L (ref 3.5–5.1)

## 2011-11-12 LAB — GLUCOSE, CAPILLARY
Glucose-Capillary: 90 mg/dL (ref 70–99)
Glucose-Capillary: 132 mg/dL — ABNORMAL HIGH (ref 70–99)
Glucose-Capillary: 347 mg/dL — ABNORMAL HIGH (ref 70–99)

## 2011-11-12 LAB — CULTURE, BLOOD (ROUTINE X 2)
Culture  Setup Time: 201212040128
Culture: NO GROWTH
Culture: NO GROWTH

## 2011-11-12 LAB — PRO B NATRIURETIC PEPTIDE: Pro B Natriuretic peptide (BNP): 5128 pg/mL — ABNORMAL HIGH (ref 0–125)

## 2011-11-12 MED ORDER — POTASSIUM CHLORIDE CRYS ER 20 MEQ PO TBCR
40.0000 meq | EXTENDED_RELEASE_TABLET | Freq: Once | ORAL | Status: AC
  Administered 2011-11-12: 40 meq via ORAL
  Filled 2011-11-12: qty 2

## 2011-11-12 MED ORDER — POTASSIUM CHLORIDE CRYS ER 20 MEQ PO TBCR
40.0000 meq | EXTENDED_RELEASE_TABLET | Freq: Two times a day (BID) | ORAL | Status: DC
  Administered 2011-11-12 – 2011-11-14 (×4): 40 meq via ORAL
  Filled 2011-11-12 (×3): qty 2
  Filled 2011-11-12: qty 1
  Filled 2011-11-12: qty 2

## 2011-11-12 MED ORDER — CARVEDILOL 6.25 MG PO TABS
6.2500 mg | ORAL_TABLET | Freq: Two times a day (BID) | ORAL | Status: DC
  Administered 2011-11-12 – 2011-11-14 (×4): 6.25 mg via ORAL
  Filled 2011-11-12 (×6): qty 1

## 2011-11-12 MED ORDER — PREDNISONE 5 MG PO TABS
5.0000 mg | ORAL_TABLET | ORAL | Status: DC
  Administered 2011-11-13 – 2011-11-14 (×2): 5 mg via ORAL
  Filled 2011-11-12 (×3): qty 1

## 2011-11-12 MED ORDER — BUMETANIDE 2 MG PO TABS
4.0000 mg | ORAL_TABLET | Freq: Two times a day (BID) | ORAL | Status: DC
  Administered 2011-11-12 – 2011-11-14 (×4): 4 mg via ORAL
  Filled 2011-11-12 (×6): qty 2

## 2011-11-12 NOTE — Progress Notes (Signed)
Subjective:  Still SOB, "no change"  Objective:  Vital Signs in the last 24 hours: Temp:  [97.9 F (36.6 C)-98.2 F (36.8 C)] 97.9 F (36.6 C) (12/10 0429) Pulse Rate:  [66-76] 66  (12/10 0429) Resp:  [18-20] 18  (12/10 0429) BP: (137-150)/(67-75) 137/75 mmHg (12/10 0429) SpO2:  [94 %-96 %] 94 % (12/10 0429) Weight:  [109.861 kg (242 lb 3.2 oz)] 242 lb 3.2 oz (109.861 kg) (12/10 0429)  Intake/Output from previous day:  Intake/Output Summary (Last 24 hours) at 11/12/11 0927 Last data filed at 11/12/11 0851  Gross per 24 hour  Intake 2110.83 ml  Output   1675 ml  Net 435.83 ml    Physical Exam: General appearance: alert, cooperative and no distress Heart: irregularly irregular rhythm Abdomen: soft, non-tender; bowel sounds normal; no masses,  no organomegaly dressing applied Rt great toe by RN for "bleeding toenail"   Rate: 70  Rhythm: atrial fibrillation and PVC's short runs on NSVT  Lab Results:  [facility] 11/10/11 0645  WBC 11.2*  HGB 11.9*  PLT 195    Basename 11/12/11 0545 11/11/11 1459 11/11/11 0555  NA 139 -- 137  K 2.8* 3.5 --  CL 93* -- 90*  CO2 30 -- 30  GLUCOSE 95 -- 143*  BUN 90* -- 89*  CREATININE 2.82* -- 2.88*   No results found for this basename: TROPONINI:2,CK,MB:2 in the last 72 hours Hepatic Function Panel  Basename 11/12/11 0545  PROT --  ALBUMIN 3.0*  AST --  ALT --  ALKPHOS --  BILITOT --  BILIDIR --  IBILI --   No results found for this basename: CHOL in the last 72 hours  Basename 11/12/11 0545  INR 2.11*    Imaging: No results found.  Cardiac Studies:  Assessment/Plan:   Principal Problem:  *Acute on chronic systolic heart failure Active Problems:  CKD (chronic kidney disease) stage 4, GFR 15-29 ml/min  Hypokalemia  Ischemic cardiomyopathy, EF 25%  Chronic anticoagulation  Non-sustained ventricular tachycardia  CAD (coronary artery disease)  Hx of CABG x 6 2003  ICD (implantable cardiac defibrillator) in  place  Atrial fibrillation with controlled ventricular response  Diabetes mellitus type 2, insulin dependent  Positive Coombs test  Anemia due to chronic illness  HTN (hypertension)  Dyslipidemia  Sleep apnea, C-Pap intol  Peripheral vascular disease, unspecified   Plan- aggressively replace K+ again today. Diuretics per Renal, Dr Royann Shivers to see.   Corine Shelter PA-C 11/12/2011, 9:27 AM    I have seen and examined the patient along with Marnette Burgess, PA.  I have reviewed the chart, notes and new data.  I agree with PA's note.  Key new complaints: no longer orthopneic. He slept with the bed virtually horizontal last night. He feels he has less dyspnea on exertion as well.  Key examination changes: he no longer has as much facial edema or abdominal distention but still has severe 4+ pitting edema of the lower extremities Key new findings / data: renal function has stabilized at creatinine of 2.8 but he has poor urine output and remains markedly hypokalemic  He still staunchly refuses to consider any form of dialysis.  PLAN: Still hypervolemic. Respiratory infection has resolved.  Back off beta blocker dose temporarily. Decrease steroid dose, this may help with loss in fluid weight.   He is clearly not yet fully diuresed but has become insistent on going home soon. I believe we should try to transition him from intravenous diuretics and high dose metolazone  to regimen back will be more manageable at home. We'll discontinue the IV Lasix and see if he responds any better to oral Bumex. Continue to aggressively replace potassium.I will plan to see them in our regional office every week or 2. Plan to discharge home when potassium reaches a safe level as long as there is no evidence of recurrence of fluid gain on a regimen of oral diuretics.  Thurmon Fair, MD, [facility] [phone] 11/12/2011, 4:02 PM

## 2011-11-12 NOTE — Progress Notes (Signed)
S:says he is breathing better O:BP 137/75  Pulse 66  Temp(Src) 97.9 F (36.6 C) (Oral)  Resp 18  Ht 6\' 2"  (1.88 m)  Wt 109.861 kg (242 lb 3.2 oz)  BMI 31.10 kg/m2  SpO2 94%  Intake/Output Summary (Last 24 hours) at 11/12/11 1342 Last data filed at 11/12/11 1312  Gross per 24 hour  Intake 2110.83 ml  Output   1925 ml  Net 185.83 ml   Weight change: -1.539 kg (-3 lb 6.3 oz) ZOX:WRUEA and alert VWU:JWJXB/JYNWG Resp:decreased BS throughout  Few faint crackles in bases Abd:+BS NTND Ext:3+ edema NEURO:Ox3 no asterixis     . allopurinol  300 mg Oral Daily  . carvedilol  12.5 mg Oral BID WC  . ferrous sulfate  325 mg Oral BID WC  . furosemide  160 mg Intravenous Q6H  . insulin aspart  0-15 Units Subcutaneous TID WC  . insulin aspart  15 Units Subcutaneous TID WC  . insulin glargine  40 Units Subcutaneous QHS  . insulin glargine  80 Units Subcutaneous Q0700  . isosorbide-hydrALAZINE  1 tablet Oral TID  . levothyroxine  50 mcg Oral Q0700  . metolazone  10 mg Oral Q0700  . pantoprazole  40 mg Oral Q1200  . potassium chloride  10 mEq Intravenous Q1 Hr x 5  . potassium chloride  20 mEq Oral BID  . potassium chloride  40 mEq Oral Once  . potassium chloride  40 mEq Oral Once  . predniSONE  12.5 mg Oral Q0700  . simvastatin  40 mg Oral q1800  . sodium chloride  3 mL Intravenous Q12H  . warfarin  2.5 mg Oral Q Fri-1800  . warfarin  5 mg Oral Custom  . warfarin   Does not apply Once   No results found. BMET    Component Value Date/Time   NA 139 11/12/2011 0545   K 2.8* 11/12/2011 0545   CL 93* 11/12/2011 0545   CO2 30 11/12/2011 0545   GLUCOSE 95 11/12/2011 0545   BUN 90* 11/12/2011 0545   CREATININE 2.82* 11/12/2011 0545   CALCIUM 9.5 11/12/2011 0545   GFRNONAA 21* 11/12/2011 0545   GFRAA 24* 11/12/2011 0545   CBC    Component Value Date/Time   WBC 11.2* 11/10/2011 0645   WBC 7.9 04/13/2008 0854   RBC 4.16* 11/10/2011 0645   RBC 4.14* 04/13/2008 0854   HGB 11.9*  11/10/2011 0645   HGB 12.3* 04/13/2008 0854   HCT 35.8* 11/10/2011 0645   HCT 35.7* 04/13/2008 0854   PLT 195 11/10/2011 0645   PLT 181 04/13/2008 0854   MCV 86.1 11/10/2011 0645   MCV 86.1 04/13/2008 0854   MCH 28.6 11/10/2011 0645   MCH 29.6 04/13/2008 0854   MCHC 33.2 11/10/2011 0645   MCHC 34.4 04/13/2008 0854   RDW 18.1* 11/10/2011 0645   RDW 18.0* 04/13/2008 0854   LYMPHSABS 1.0 11/05/2011 1923   LYMPHSABS 1.5 04/13/2008 0854   MONOABS 1.2* 11/05/2011 1923   MONOABS 0.6 04/13/2008 0854   EOSABS 0.0 11/05/2011 1923   EOSABS 0.2 04/13/2008 0854   BASOSABS 0.0 11/05/2011 1923   BASOSABS 0.1 04/13/2008 0854     Assessment:  1. CKD 4 2. CHF sec ischemic cardiomyopathy 3. hypokalemia   Plan: Fluid restrict to 1.2 l /d Replace K Could add zaroxalyn 5-10 mg per day Ask palliative care to see He is adamant about no HD which is appropriate   Tayvia Faughnan T

## 2011-11-12 NOTE — Progress Notes (Signed)
Pharmacy Consult for Coumadin   Indication: atrial fibrillation   Assessment:  76 yo male on Coumadin PTA for afib. INR therapeutic 2.11  Now restarted on home dose of 5mg  daily except for 2.5mg  on Fridays.  H/H/P stable with no complications observed.   Goal of Therapy:  INR 2-3    Plan:  Continue coumadin home dose schedule.  (Coumadin 5mg  today).  Change INR checks to MWF  11/12/2011 Sheppard Coil Pharm.D.

## 2011-11-12 NOTE — Progress Notes (Signed)
   CARE MANAGEMENT NOTE 11/12/2011  Patient:  [name], [name]   Account Number:  [id number]  Date Initiated:  11/06/2011  Documentation initiated by:  Shannan Harper  Subjective/Objective Assessment:   Patient admitted with worsening CHF.     Action/Plan:   Patient refused any discharge planning or support at time of discharged.  Reinforced importance however he did not want.   Anticipated DC Date:  11/08/2011   Anticipated DC Plan:  HOME/SELF CARE      DC Planning Services  CM consult      Choice offered to / List presented to:             Status of service:  In process, will continue to follow Medicare Important Message given?   (If response is "NO", the following Medicare IM given date fields will be blank) Date Medicare IM given:   Date Additional Medicare IM given:    Discharge Disposition:    Per UR Regulation:  Reviewed for med. necessity/level of care/duration of stay  Comments:  Heart Failure Patient:  Met in heart failure rounds this morning and discussed POC. Requested palliative care consult due to patient's unwillingness to continue with dialysis.  MD saw note and has ordered palliative care consult.  Will continue to assist. UR Concurrent Review Completed. 11/12/11 1424 Shannan Harper, RN, BSN  UR Concurrent Review Completed. 11/09/11 1200 Shannan Harper, RN, BSN  Met with patient in the presence of his nurse who was documenting.  Patient and I discussed CHF management on discharge with [facility] RN and he refused any support measures at discharge.  He said he was fine and was not interested.  I reinforced the importance of daily weights, salt intake, verified he had a working scale at home, and the zone tool. He did validate understanding on those measures.  Will continue to assist as needed.  11/06/11 1540 Shannan Harper, RN, BSN  UR Completed. 11/06/11 1222 Shannan Harper, RN, BSN

## 2011-11-13 LAB — RENAL FUNCTION PANEL
Albumin: 3.2 g/dL — ABNORMAL LOW (ref 3.5–5.2)
BUN: 88 mg/dL — ABNORMAL HIGH (ref 6–23)
Calcium: 10 mg/dL (ref 8.4–10.5)
Creatinine, Ser: 2.64 mg/dL — ABNORMAL HIGH (ref 0.50–1.35)
GFR calc non Af Amer: 22 mL/min — ABNORMAL LOW (ref >90)
Phosphorus: 4.6 mg/dL (ref 2.3–4.6)

## 2011-11-13 LAB — GLUCOSE, CAPILLARY
Glucose-Capillary: 269 mg/dL — ABNORMAL HIGH (ref 70–99)
Glucose-Capillary: 260 mg/dL — ABNORMAL HIGH (ref 70–99)
Glucose-Capillary: 229 mg/dL — ABNORMAL HIGH (ref 70–99)

## 2011-11-13 MED ORDER — SPIRONOLACTONE 12.5 MG HALF TABLET
12.5000 mg | ORAL_TABLET | Freq: Every day | ORAL | Status: DC
  Administered 2011-11-13 – 2011-11-14 (×2): 12.5 mg via ORAL
  Filled 2011-11-13 (×2): qty 1

## 2011-11-13 NOTE — Progress Notes (Signed)
   HEART FAILURE CARE MANAGEMENT NOTE 11/13/2011  Patient:  [name], [name]   Account Number:  [id number]  Date Initiated:  11/06/2011  Documentation initiated by:  Shannan Harper  Subjective/Objective Assessment:   Patient admitted with worsening CHF.     Action/Plan:   Patient refused any discharge planning or support at time of discharged.  Reinforced importance however he did not want.   Anticipated DC Date:  11/08/2011   Anticipated DC Plan:  HOME/SELF CARE      DC Planning Services  CM consult      Choice offered to / List presented to:             Status of service:  In process, will continue to follow Medicare Important Message given?   (If response is "NO", the following Medicare IM given date fields will be blank) Date Medicare IM given:   Date Additional Medicare IM given:    Discharge Disposition:    Per UR Regulation:  Reviewed for med. necessity/level of care/duration of stay  Comments:  Heart Failure Patient:  Met again with patient today after speaking with PA and HH is being requested as creatinine has improved.  The patient however is refusing to go home with [facility].  We discussed again the importance of their presence and diet, weight, education, zones, and salt intake.  Regardless he does not want to proceed.  Will note in EPIC. 11/13/11 1631 Shannan Harper, RN, BSN  Met in heart failure rounds this morning and discussed POC. Requested palliative care consult due to patient's unwillingness to continue with dialysis.  MD saw note and has ordered palliative care consult.  Will continue to assist. UR Concurrent Review Completed. 11/12/11 1424 Shannan Harper, RN, BSN  UR Concurrent Review Completed. 11/09/11 1200 Shannan Harper, RN, BSN  Met with patient in the presence of his nurse who was documenting.  Patient and I discussed CHF management on discharge with [facility] RN and he refused any support measures at discharge.  He said he was fine and was not interested.  I  reinforced the importance of daily weights, salt intake, verified he had a working scale at home, and the zone tool. He did validate understanding on those measures.  Will continue to assist as needed.  11/06/11 1540 Shannan Harper, RN, BSN  UR Completed. 11/06/11 1222 Shannan Harper, RN, BSN

## 2011-11-13 NOTE — Progress Notes (Signed)
Subjective:  Still has lower extremity edema  Objective:  Vital Signs in the last 24 hours: Temp:  [98.1 F (36.7 C)-98.6 F (37 C)] 98.6 F (37 C) (12/11 0601) Pulse Rate:  [53-67] 53  (12/11 0601) Resp:  [20-22] 22  (12/11 0601) BP: (144-165)/(67-70) 165/67 mmHg (12/11 0601) SpO2:  [95 %-96 %] 96 % (12/11 0601) Weight:  [109.8 kg (242 lb 1 oz)] 242 lb 1 oz (109.8 kg) (12/11 0601)  Intake/Output from previous day:  Intake/Output Summary (Last 24 hours) at 11/13/11 0831 Last data filed at 11/13/11 0608  Gross per 24 hour  Intake   1480 ml  Output   1825 ml  Net   -345 ml    Physical Exam: General appearance: alert, cooperative and no distress Lungs: few basilar rales Heart: irregularly irregular rhythm Extrem: 2+ edmema   Rate: 60  Rhythm: atrial fibrillation and frequent PVC's short runs of NSVT  Lab Results: No results found for this basename: WBC:2,HGB:2,PLT:2 in the last 72 hours  Basename 11/13/11 0545 11/12/11 1617 11/12/11 0545  NA 140 -- 139  K 3.2* 3.6 --  CL 94* -- 93*  CO2 30 -- 30  GLUCOSE 62* -- 95  BUN 88* -- 90*  CREATININE 2.64* -- 2.82*   No results found for this basename: TROPONINI:2,CK,MB:2 in the last 72 hours Hepatic Function Panel  Basename 11/13/11 0545  PROT --  ALBUMIN 3.2*  AST --  ALT --  ALKPHOS --  BILITOT --  BILIDIR --  IBILI --   No results found for this basename: CHOL in the last 72 hours  Basename 11/12/11 0545  INR 2.11*    Imaging: No results found.  Cardiac Studies:  Assessment/Plan:   Principal Problem:  *Acute on chronic systolic heart failure, wgt 115-109 kg   Active Problems:  CKD (chronic kidney disease) stage 4, GFR 15-29 ml/min, pt refuses dialysis  Hypokalemia, K+ 3.2 today  Ischemic cardiomyopathy, EF 25%  Chronic anticoagulation  Non-sustained ventricular tachycardia  CAD (coronary artery disease)  Hx of CABG x 6 2003  ICD (implantable cardiac defibrillator) in place  Atrial  fibrillation with controlled ventricular response  Diabetes mellitus type 2, insulin dependent  Positive Coombs test  Anemia due to chronic illness  HTN (hypertension)  Dyslipidemia  Sleep apnea, C-Pap intol  Peripheral vascular disease, unspecified   Plan- Lasix IV changed to po Bumex, Coreg decreased, follow K+, possible d/c in am.   Corine Shelter PA-C 11/13/2011, 8:31 AM    Agree with note written by Corine Shelter [facility]  Runell Gess 11/13/2011 1:59 PM  Pt admitted with CHF, ISCM. CAF.Diuresing. Neg fluid balance. K+ low. Replete. Transitioned to PO diuetics. Will add spironolactone. Exam notable for clear lings, 1+ BLE edema. CRI stage 4. Probably home tomorrow with close OP follow up.

## 2011-11-13 NOTE — Progress Notes (Signed)
S:says he is breathing better Eating better O:BP 114/59  Pulse 67  Temp(Src) 98.5 F (36.9 C) (Oral)  Resp 20  Ht 6\' 2"  (1.88 m)  Wt 109.8 kg (242 lb 1 oz)  BMI 31.08 kg/m2  SpO2 96%  Intake/Output Summary (Last 24 hours) at 11/13/11 1036 Last data filed at 11/13/11 0608  Gross per 24 hour  Intake    950 ml  Output   1825 ml  Net   -875 ml   Weight change: -0.061 kg (-2.2 oz) ZOX:WRUEA and alert VWU:JWJXB/JYNWG Resp:decreased BS throughout  Few faint crackles in bases Abd:+BS NTND Ext:3+ edema NEURO:Ox3 no asterixis     . allopurinol  300 mg Oral Daily  . bumetanide  4 mg Oral BID  . carvedilol  6.25 mg Oral BID WC  . ferrous sulfate  325 mg Oral BID WC  . insulin aspart  0-15 Units Subcutaneous TID WC  . insulin aspart  15 Units Subcutaneous TID WC  . insulin glargine  40 Units Subcutaneous QHS  . insulin glargine  80 Units Subcutaneous Q0700  . isosorbide-hydrALAZINE  1 tablet Oral TID  . levothyroxine  50 mcg Oral Q0700  . metolazone  10 mg Oral Q0700  . pantoprazole  40 mg Oral Q1200  . potassium chloride  40 mEq Oral Once  . potassium chloride  40 mEq Oral Once  . potassium chloride  40 mEq Oral BID  . predniSONE  5 mg Oral Q0700  . simvastatin  40 mg Oral q1800  . sodium chloride  3 mL Intravenous Q12H  . warfarin  2.5 mg Oral Q Fri-1800  . warfarin  5 mg Oral Custom  . warfarin   Does not apply Once  . DISCONTD: carvedilol  12.5 mg Oral BID WC  . DISCONTD: furosemide  160 mg Intravenous Q6H  . DISCONTD: potassium chloride  20 mEq Oral BID  . DISCONTD: predniSONE  12.5 mg Oral Q0700   No results found. BMET    Component Value Date/Time   NA 140 11/13/2011 0545   K 3.2* 11/13/2011 0545   CL 94* 11/13/2011 0545   CO2 30 11/13/2011 0545   GLUCOSE 62* 11/13/2011 0545   BUN 88* 11/13/2011 0545   CREATININE 2.64* 11/13/2011 0545   CALCIUM 10.0 11/13/2011 0545   GFRNONAA 22* 11/13/2011 0545   GFRAA 26* 11/13/2011 0545   CBC    Component Value  Date/Time   WBC 11.2* 11/10/2011 0645   WBC 7.9 04/13/2008 0854   RBC 4.16* 11/10/2011 0645   RBC 4.14* 04/13/2008 0854   HGB 11.9* 11/10/2011 0645   HGB 12.3* 04/13/2008 0854   HCT 35.8* 11/10/2011 0645   HCT 35.7* 04/13/2008 0854   PLT 195 11/10/2011 0645   PLT 181 04/13/2008 0854   MCV 86.1 11/10/2011 0645   MCV 86.1 04/13/2008 0854   MCH 28.6 11/10/2011 0645   MCH 29.6 04/13/2008 0854   MCHC 33.2 11/10/2011 0645   MCHC 34.4 04/13/2008 0854   RDW 18.1* 11/10/2011 0645   RDW 18.0* 04/13/2008 0854   LYMPHSABS 1.0 11/05/2011 1923   LYMPHSABS 1.5 04/13/2008 0854   MONOABS 1.2* 11/05/2011 1923   MONOABS 0.6 04/13/2008 0854   EOSABS 0.0 11/05/2011 1923   EOSABS 0.2 04/13/2008 0854   BASOSABS 0.0 11/05/2011 1923   BASOSABS 0.1 04/13/2008 0854     Assessment:  1. CKD 4 2. CHF sec ischemic cardiomyopathy 3. hypokalemia   Plan: Fluid restrict to 1.2 l /d Replace K  Ask palliative care to see He is adamant about no HD which is appropriate I don't I have a lot to add.he will need support at home to keep him out of the hospital and that is where palliative care could help out.  Will sign off   Takiyah Bohnsack T

## 2011-11-14 LAB — RENAL FUNCTION PANEL
CO2: 32 mEq/L (ref 19–32)
Chloride: 94 mEq/L — ABNORMAL LOW (ref 96–112)
GFR calc Af Amer: 25 mL/min — ABNORMAL LOW (ref >90)
GFR calc non Af Amer: 22 mL/min — ABNORMAL LOW (ref >90)
Sodium: 141 mEq/L (ref 135–145)

## 2011-11-14 LAB — GLUCOSE, CAPILLARY
Glucose-Capillary: 61 mg/dL — ABNORMAL LOW (ref 70–99)
Glucose-Capillary: 244 mg/dL — ABNORMAL HIGH (ref 70–99)

## 2011-11-14 LAB — PRO B NATRIURETIC PEPTIDE: Pro B Natriuretic peptide (BNP): 4426 pg/mL — ABNORMAL HIGH (ref 0–125)

## 2011-11-14 MED ORDER — CARVEDILOL 6.25 MG PO TABS: 6.2500 mg | ORAL_TABLET | Freq: Two times a day (BID) | ORAL | Status: DC

## 2011-11-14 MED ORDER — METOLAZONE 10 MG PO TABS: 10.0000 mg | ORAL_TABLET | ORAL | Status: DC

## 2011-11-14 MED ORDER — INSULIN GLARGINE 100 UNIT/ML ~~LOC~~ SOLN: 40.0000 [IU] | Freq: Every day | SUBCUTANEOUS | Status: DC

## 2011-11-14 MED ORDER — ISOSORB DINITRATE-HYDRALAZINE 20-37.5 MG PO TABS: 1.0000 | ORAL_TABLET | Freq: Three times a day (TID) | ORAL | Status: DC

## 2011-11-14 MED ORDER — POTASSIUM CHLORIDE CRYS ER 20 MEQ PO TBCR: 40.0000 meq | EXTENDED_RELEASE_TABLET | Freq: Two times a day (BID) | ORAL | Status: DC

## 2011-11-14 MED ORDER — BUMETANIDE 2 MG PO TABS: 4.0000 mg | ORAL_TABLET | Freq: Two times a day (BID) | ORAL | Status: DC

## 2011-11-14 MED ORDER — SPIRONOLACTONE 12.5 MG HALF TABLET: 12.5000 mg | ORAL_TABLET | Freq: Every day | ORAL | Status: DC

## 2011-11-14 MED ORDER — ACETAMINOPHEN 325 MG PO TABS: 650.0000 mg | ORAL_TABLET | ORAL | Status: AC | PRN

## 2011-11-14 MED ORDER — PREDNISONE 5 MG PO TABS: 5.0000 mg | ORAL_TABLET | ORAL | Status: AC

## 2011-11-14 MED ORDER — INSULIN GLARGINE 100 UNIT/ML ~~LOC~~ SOLN: 80.0000 [IU] | Freq: Every day | SUBCUTANEOUS | Status: DC

## 2011-11-14 NOTE — Discharge Summary (Signed)
Bruce Wheeler C. Itzabella Sorrels, MD Attending Cardiologist [facility]  

## 2011-11-14 NOTE — Progress Notes (Signed)
Inpatient Diabetes Program Recommendations  AACE/ADA: New Consensus Statement on Inpatient Glycemic Control (2009)  Target Ranges:  Prepandial:   less than 140 mg/dL      Peak postprandial:   less than 180 mg/dL (1-2 hours)      Critically ill patients:  140 - 180 mg/dL   Reason: morning hypoglycemia lab glucose=58   Inpatient Diabetes Program Recommendations Insulin - Basal: decrease Lantus pm dose to 20 units Insulin - Meal Coverage: increase meal coverage to 20  Will continue to follow.   Piedad Climes, RN, BSN, CDE

## 2011-11-14 NOTE — Progress Notes (Signed)
CRITICAL VALUE ALERT  Critical value received:  CBG=61  Date of notification:  11/14/11  Time of notification:  0630  Critical value read back:yes   Nurse who received alert:  R.Dorow RN  MD notified (1st page):  0630  Time of first page: protocol  MD notified (2nd page):  Time of second page:  Responding MD:  n/a  Time MD responded:  n/a

## 2011-11-14 NOTE — Progress Notes (Signed)
ANTICOAGULATION CONSULT NOTE - Follow Up Consult  Pharmacy Consult for warfarin Indication: atrial fibrillation  No Known Allergies  Patient Measurements: Height: 6\' 2"  (188 cm) Weight: 238 lb 15.7 oz (108.4 kg) IBW/kg (Calculated) : 82.2  Adjusted Body Weight:   Vital Signs: Temp: 98.3 F (36.8 C) (12/12 0500) Temp src: Oral (12/12 0500) BP: 150/71 mmHg (12/12 0500) Pulse Rate: 66  (12/12 0500)  Labs:  Basename 11/14/11 0610 11/13/11 0545 11/12/11 0545  HGB -- -- --  HCT -- -- --  PLT -- -- --  APTT -- -- --  LABPROT 24.7* -- 24.0*  INR 2.19* -- 2.11*  HEPARINUNFRC -- -- --  CREATININE 2.70* 2.64* 2.82*  CKTOTAL -- -- --  CKMB -- -- --  TROPONINI -- -- --   Estimated Creatinine Clearance: 31.5 ml/min (by C-G formula based on Cr of 2.7).   Medications:  Prescriptions prior to admission  Medication Sig Dispense Refill  . allopurinol (ZYLOPRIM) 300 MG tablet Take 450 mg by mouth daily.        . carvedilol (COREG) 25 MG tablet Take 25 mg by mouth 2 (two) times daily with a meal.        . ferrous fumarate (HEMOCYTE - 106 MG FE) 325 (106 FE) MG TABS Take 2 tablets by mouth daily.        . furosemide (LASIX) 40 MG tablet Take 40 mg by mouth 2 (two) times daily.        . insulin aspart (NOVOLOG) 100 UNIT/ML injection Inject 20-25 Units into the skin 3 (three) times daily before meals. Takes 20 units in the morning, 25 units at lunch,  & 25 units in the evening       . insulin glargine (LANTUS) 100 UNIT/ML injection Inject 80 Units into the skin 2 (two) times daily.        Marland Kitchen levothyroxine (SYNTHROID, LEVOTHROID) 50 MCG tablet Take 50 mcg by mouth daily.        . metolazone (ZAROXOLYN) 2.5 MG tablet Take 2.5 mg by mouth daily. Takes 30 minutes prior to furosemide       . omeprazole (PRILOSEC) 20 MG capsule Take 40 mg by mouth daily.        . predniSONE (DELTASONE) 20 MG tablet Take 20 mg by mouth daily.        . simvastatin (ZOCOR) 40 MG tablet Take 40 mg by mouth at  bedtime.        Marland Kitchen warfarin (COUMADIN) 5 MG tablet Take 5 mg by mouth. Takes 5mg  every day except Fridays and takes 2.5mg .  Had 5mg  last on 11-05-11       . warfarin (COUMADIN) 5 MG tablet Take 2.5 mg by mouth. Takes 2.5mg  on Fridays         Assessment/plan  afib-chronic warfarin therapy, INR very stable, noted plans for discharge today, would continue same dose of warfarin as pta - 5mg  daily except 2.5 mg on Friday.  Severiano Gilbert 11/14/2011,10:13 AM

## 2011-11-14 NOTE — Progress Notes (Signed)
Discussed in the long length of stay meeting Lakisha Peyser Weeks 11/14/2011  

## 2011-11-14 NOTE — Discharge Summary (Signed)
Patient ID: [name],  MRN: [id number], DOB/AGE: [birth year] 54 y.o.  Admit date: 11/05/2011 Discharge date: 11/14/2011  Primary Care Provider:  Primary Cardiologist: Dr Royann Shivers  Discharge Diagnoses Principal Problem:  *Acute on chronic systolic heart failure Active Problems:  CKD (chronic kidney disease) stage 4, GFR 15-29 ml/min  Hypokalemia  Ischemic cardiomyopathy, EF 25%  Chronic anticoagulation  Non-sustained ventricular tachycardia  CAD (coronary artery disease)  Hx of CABG x 6 2003  ICD (implantable cardiac defibrillator) in place  Atrial fibrillation with controlled ventricular response  Diabetes mellitus type 2, insulin dependent  Positive Coombs test  Anemia due to chronic illness  HTN (hypertension)  Dyslipidemia  Sleep apnea, C-Pap intol  Peripheral vascular disease, unspecified    Procedures: None  History of Present Illness: Patient with severe ischemic cardiomyopathy (EF 25-30%) s/p CABG 2003, OSA, insulin requiring DM, with permanent AF on warfarin and advanced CKD (last serum creat 2.4, estimated GFR <30), recently with severe hypervolemia while receiving tapering steroid therapy for anemia with Coombs+ve test (Dr. Claude Manges, [place], [place]). 33 lb weight gain on top of previous hypervolemia (baseline weight probably around 225 lb, peaked at 275 lb, now 258 lb. Lost 17 lb in last 2 weeks on metolazone 5 mg daily and furosemide 80 mg PO bid, but feels worse. Has fallen twice in last couple of days, without syncope or dizziness - weak legs. Hyperglycemia is worse since on steroids.   Hospital Course : [name] is a 76 year old male from [place]. He sees Dr.Croitoru. He has chronic systolic congestive heart failure. He has Coombs positive anemia and has been on steroids per his primary care doctor in [place]. He was admitted 11/05/2011 with exacerbation of congestive heart failure. Patient was started on IV diuretics. He was seen in consult by the  renal service as he has baseline stage IV renal insufficiency. Dialysis was discussed with the patient, at this time the patient refuses to have dialysis. He has diuresed with IV diuretics and this was changed to Bumex 24 hours prior to discharge. Dr. Royann Shivers cut back on his prednisone thinking this may be exacerbating his fluid retention. His weight on admission was 254 pounds, at discharge 238 pounds. We feel he is still probably fluid overloaded but the patient insists on discharge. During his hospital stay she would also had problems with hypokalemia. His potassium was consistently low. This was replaced aggressively. At discharge she is on 40 mEq of potassium twice a day as well as Aldactone 12.5 mg a day. His creatinine is 2.7. His BMP will need to be watched closely. Will arrange for him to see Dr. Royann Shivers next week in [place].  Discharge Vitals:  Blood pressure 150/71, pulse 66, temperature 98.3 F (36.8 C), temperature source Oral, resp. rate 20, height 6\' 2"  (1.88 m), weight 108.4 kg (238 lb 15.7 oz), SpO2 93.00%.    Labs: Results for orders placed during the hospital encounter of 11/05/11 (from the past 48 hour(s))  GLUCOSE, CAPILLARY     Status: Abnormal   Collection Time   11/12/11 11:11 AM      Component Value Range Comment   Glucose-Capillary 132 (*) 70 - 99 (mg/dL)    Comment 1 Notify RN      Comment 2 Documented in Chart     POTASSIUM     Status: Normal   Collection Time   11/12/11  4:17 PM      Component Value Range Comment   Potassium 3.6  3.5 - 5.1 (  mEq/L) DELTA CHECK NOTED  GLUCOSE, CAPILLARY     Status: Abnormal   Collection Time   11/12/11  4:19 PM      Component Value Range Comment   Glucose-Capillary 347 (*) 70 - 99 (mg/dL)   GLUCOSE, CAPILLARY     Status: Abnormal   Collection Time   11/12/11  9:12 PM      Component Value Range Comment   Glucose-Capillary 112 (*) 70 - 99 (mg/dL)    Comment 1 Notify RN     RENAL FUNCTION PANEL     Status: Abnormal    Collection Time   11/13/11  5:45 AM      Component Value Range Comment   Sodium 140  135 - 145 (mEq/L)    Potassium 3.2 (*) 3.5 - 5.1 (mEq/L)    Chloride 94 (*) 96 - 112 (mEq/L)    CO2 30  19 - 32 (mEq/L)    Glucose, Bld 62 (*) 70 - 99 (mg/dL)    BUN 88 (*) 6 - 23 (mg/dL)    Creatinine, Ser 9.56 (*) 0.50 - 1.35 (mg/dL)    Calcium 21.3  8.4 - 10.5 (mg/dL)    Phosphorus 4.6  2.3 - 4.6 (mg/dL)    Albumin 3.2 (*) 3.5 - 5.2 (g/dL)    GFR calc non Af Amer 22 (*) >90 (mL/min)    GFR calc Af Amer 26 (*) >90 (mL/min)   GLUCOSE, CAPILLARY     Status: Normal   Collection Time   11/13/11  6:32 AM      Component Value Range Comment   Glucose-Capillary 94  70 - 99 (mg/dL)   GLUCOSE, CAPILLARY     Status: Abnormal   Collection Time   11/13/11 11:23 AM      Component Value Range Comment   Glucose-Capillary 269 (*) 70 - 99 (mg/dL)   GLUCOSE, CAPILLARY     Status: Abnormal   Collection Time   11/13/11  6:43 PM      Component Value Range Comment   Glucose-Capillary 260 (*) 70 - 99 (mg/dL)   GLUCOSE, CAPILLARY     Status: Abnormal   Collection Time   11/13/11  9:29 PM      Component Value Range Comment   Glucose-Capillary 229 (*) 70 - 99 (mg/dL)    Comment 1 Notify RN     RENAL FUNCTION PANEL     Status: Abnormal   Collection Time   11/14/11  6:10 AM      Component Value Range Comment   Sodium 141  135 - 145 (mEq/L)    Potassium 3.1 (*) 3.5 - 5.1 (mEq/L)    Chloride 94 (*) 96 - 112 (mEq/L)    CO2 32  19 - 32 (mEq/L)    Glucose, Bld 58 (*) 70 - 99 (mg/dL)    BUN 87 (*) 6 - 23 (mg/dL)    Creatinine, Ser 0.86 (*) 0.50 - 1.35 (mg/dL)    Calcium 57.8  8.4 - 10.5 (mg/dL)    Phosphorus 4.7 (*) 2.3 - 4.6 (mg/dL)    Albumin 3.3 (*) 3.5 - 5.2 (g/dL)    GFR calc non Af Amer 22 (*) >90 (mL/min)    GFR calc Af Amer 25 (*) >90 (mL/min)   PRO B NATRIURETIC PEPTIDE     Status: Abnormal   Collection Time   11/14/11  6:10 AM      Component Value Range Comment   Pro B Natriuretic peptide (BNP)  4426.0 (*) 0 -  125 (pg/mL)   PROTIME-INR     Status: Abnormal   Collection Time   11/14/11  6:10 AM      Component Value Range Comment   Prothrombin Time 24.7 (*) 11.6 - 15.2 (seconds)    INR 2.19 (*) 0.00 - 1.49    GLUCOSE, CAPILLARY     Status: Abnormal   Collection Time   11/14/11  6:10 AM      Component Value Range Comment   Glucose-Capillary 61 (*) 70 - 99 (mg/dL)   GLUCOSE, CAPILLARY     Status: Abnormal   Collection Time   11/14/11  6:43 AM      Component Value Range Comment   Glucose-Capillary 108 (*) 70 - 99 (mg/dL)     Disposition:  Follow-up Information    Follow up with [facility] will call)    Contact information:   [address] [place] [address] [phone]          Discharge Medications:  Current Discharge Medication List    START taking these medications   Details  acetaminophen (TYLENOL) 325 MG tablet Take 2 tablets (650 mg total) by mouth every 4 (four) hours as needed. Qty: 30 tablet    bumetanide (BUMEX) 2 MG tablet Take 2 tablets (4 mg total) by mouth 2 (two) times daily. Qty: 100 tablet, Refills: 5    isosorbide-hydrALAZINE (BIDIL) 20-37.5 MG per tablet Take 1 tablet by mouth 3 (three) times daily. Qty: 100 tablet, Refills: 5    potassium chloride SA (K-DUR,KLOR-CON) 20 MEQ tablet Take 2 tablets (40 mEq total) by mouth 2 (two) times daily. Qty: 100 tablet, Refills: 5    spironolactone (ALDACTONE) 12.5 mg TABS Take 0.5 tablets (12.5 mg total) by mouth daily. Qty: 30 tablet, Refills: 3      CONTINUE these medications which have CHANGED   Details  carvedilol (COREG) 6.25 MG tablet Take 1 tablet (6.25 mg total) by mouth 2 (two) times daily with a meal. Qty: 60 tablet, Refills: 5    !! insulin glargine (LANTUS) 100 UNIT/ML injection Inject 40 Units into the skin at bedtime. Qty: 10 mL, Refills: 5    !! insulin glargine (LANTUS) 100 UNIT/ML injection Inject 80 Units into the skin  daily. Qty: 10 mL, Refills: 0    metolazone (ZAROXOLYN) 10 MG tablet Take 1 tablet (10 mg total) by mouth every morning. Qty: 30 tablet, Refills: 5    predniSONE (DELTASONE) 5 MG tablet Take 1 tablet (5 mg total) by mouth every morning. Qty: 30 tablet, Refills: 5     !! - Potential duplicate medications found. Please discuss with provider.    CONTINUE these medications which have NOT CHANGED   Details  allopurinol (ZYLOPRIM) 300 MG tablet Take 450 mg by mouth daily.      ferrous fumarate (HEMOCYTE - 106 MG FE) 325 (106 FE) MG TABS Take 2 tablets by mouth daily.      insulin aspart (NOVOLOG) 100 UNIT/ML injection Inject 20-25 Units into the skin 3 (three) times daily before meals. Takes 20 units in the morning, 25 units at lunch,  & 25 units in the evening     levothyroxine (SYNTHROID, LEVOTHROID) 50 MCG tablet Take 50 mcg by mouth daily.      omeprazole (PRILOSEC) 20 MG capsule Take 40 mg by mouth daily.      simvastatin (ZOCOR) 40 MG tablet Take 40 mg by mouth at bedtime.      warfarin (COUMADIN) 5 MG  tablet Take 5 mg by mouth. Takes 5mg  every day except Fridays and takes 2.5mg .  Had 5mg  last on 11-05-11       STOP taking these medications     furosemide (LASIX) 40 MG tablet         Outstanding Labs/Studies  Duration of Discharge Encounter: Greater than 30 minutes including physician time.  Jolene Provost PA-C 11/14/2011 11:08 AM

## 2011-11-14 NOTE — Progress Notes (Signed)
   Heart Failure CARE MANAGEMENT NOTE 11/14/2011  Patient:  [name], [name]   Account Number:  [id number]  Date Initiated:  11/06/2011  Documentation initiated by:  Shannan Harper  Subjective/Objective Assessment:   Patient admitted with worsening CHF.     Action/Plan:   Patient refused any discharge planning or support at time of discharged.  Reinforced importance however he did not want.   Anticipated DC Date:  11/08/2011   Anticipated DC Plan:  HOME/SELF CARE      DC Planning Services  CM consult      Choice offered to / List presented to:             Status of service:  Completed, signed off Medicare Important Message given?   (If response is "NO", the following Medicare IM given date fields will be blank) Date Medicare IM given:   Date Additional Medicare IM given:    Discharge Disposition:  HOME/SELF CARE  Per UR Regulation:  Reviewed for med. necessity/level of care/duration of stay  Comments:  Heart Failure Patient:  Met again with patient today after speaking with PA and HH is being requested as creatinine has improved.  The patient however is refusing to go home with [facility].  We discussed again the importance of their presence and diet, weight, education, zones, and salt intake.  Regardless he does not want to proceed.  Will note in EPIC. 11/13/11 1631 Shannan Harper, RN, BSN  Met in heart failure rounds this morning and discussed POC. Requested palliative care consult due to patient's unwillingness to continue with dialysis.  MD saw note and has ordered palliative care consult.  Will continue to assist. UR Concurrent Review Completed. 11/12/11 1424 Shannan Harper, RN, BSN  UR Concurrent Review Completed. 11/09/11 1200 Shannan Harper, RN, BSN  Met with patient in the presence of his nurse who was documenting.  Patient and I discussed CHF management on discharge with [facility] RN and he refused any support measures at discharge.  He said he was fine and was not interested.  I  reinforced the importance of daily weights, salt intake, verified he had a working scale at home, and the zone tool. He did validate understanding on those measures.  Will continue to assist as needed.  11/06/11 1540 Shannan Harper, RN, BSN  UR Completed. 11/06/11 1222 Shannan Harper, RN, BSN

## 2011-11-14 NOTE — Progress Notes (Addendum)
Subjective:  SOB improved. Pt insisting on d/c today.  Objective:  Vital Signs in the last 24 hours: Temp:  [97.9 F (36.6 C)-98.5 F (36.9 C)] 98.3 F (36.8 C) (12/12 0500) Pulse Rate:  [66-76] 66  (12/12 0500) Resp:  [19-22] 20  (12/12 0500) BP: (114-150)/(59-71) 150/71 mmHg (12/12 0500) SpO2:  [93 %-96 %] 93 % (12/12 0500) Weight:  [108.4 kg (238 lb 15.7 oz)] 238 lb 15.7 oz (108.4 kg) (12/12 0500)  Intake/Output from previous day:  Intake/Output Summary (Last 24 hours) at 11/14/11 0836 Last data filed at 11/14/11 0500  Gross per 24 hour  Intake    600 ml  Output   1500 ml  Net   -900 ml    Physical Exam: General appearance: alert, cooperative and no distress Lungs: scattered rhonchi Heart: irregularly irregular rhythm Extrem: 2+ edema bilat   Rate: 70  Rhythm: atrial fibrillation  Lab Results: No results found for this basename: WBC:2,HGB:2,PLT:2 in the last 72 hours  Basename 11/14/11 0610 11/13/11 0545  NA 141 140  K 3.1* 3.2*  CL 94* 94*  CO2 32 30  GLUCOSE 58* 62*  BUN 87* 88*  CREATININE 2.70* 2.64*   No results found for this basename: TROPONINI:2,CK,MB:2 in the last 72 hours Hepatic Function Panel  Basename 11/14/11 0610  PROT --  ALBUMIN 3.3*  AST --  ALT --  ALKPHOS --  BILITOT --  BILIDIR --  IBILI --   No results found for this basename: CHOL in the last 72 hours  Basename 11/14/11 0610  INR 2.19*    Imaging: No results found.  Cardiac Studies:  Assessment/Plan:   Principal Problem:  *Acute on chronic systolic heart failure, diuresed 7kg. Still volume overloaded. Active Problems:  CKD (chronic kidney disease) stage 4, GFR 15-29 ml/min  Hypokalemia  Ischemic cardiomyopathy, EF 25%  Chronic anticoagulation  Non-sustained ventricular tachycardia  CAD (coronary artery disease)  Hx of CABG x 6 2003  ICD (implantable cardiac defibrillator) in place  Atrial fibrillation with controlled ventricular response  Diabetes mellitus  type 2, insulin dependent  Positive Coombs test  Anemia due to chronic illness  HTN (hypertension)  Dyslipidemia  Sleep apnea, C-Pap intol  Peripheral vascular disease, unspecified  Plan- d/c today, f/u in [place] next week. Aldactone added. Replace K+ today.    Corine Shelter PA-C 11/14/2011, 8:36 AM   Pt. Seen and examined. Agree with the NP/PA-C note as written.  Chrystie Nose, MD Attending Cardiologist [facility]

## 2011-11-14 NOTE — Progress Notes (Signed)
Pt cbg=61 orange juice given. Rechecked cbg=108. Pt asymptomatic. Will continue to monitor.

## 2012-02-12 ENCOUNTER — Ambulatory Visit (INDEPENDENT_AMBULATORY_CARE_PROVIDER_SITE_OTHER): Payer: Medicare Other | Admitting: Urology

## 2012-02-12 DIAGNOSIS — N400 Enlarged prostate without lower urinary tract symptoms: Secondary | ICD-10-CM

## 2012-02-12 DIAGNOSIS — C61 Malignant neoplasm of prostate: Secondary | ICD-10-CM

## 2012-05-27 ENCOUNTER — Emergency Department (HOSPITAL_COMMUNITY): Payer: Medicare Other

## 2012-05-27 ENCOUNTER — Encounter (HOSPITAL_COMMUNITY): Payer: Self-pay | Admitting: *Deleted

## 2012-05-27 ENCOUNTER — Inpatient Hospital Stay (HOSPITAL_COMMUNITY)
Admission: EM | Admit: 2012-05-27 | Discharge: 2012-05-30 | DRG: 639 | Disposition: A | Discharge status: Home / Self Care | Payer: Medicare Other | Attending: Internal Medicine | Admitting: Internal Medicine

## 2012-05-27 DIAGNOSIS — I798 Other disorders of arteries, arterioles and capillaries in diseases classified elsewhere: Secondary | ICD-10-CM | POA: Diagnosis present

## 2012-05-27 DIAGNOSIS — Z7901 Long term (current) use of anticoagulants: Secondary | ICD-10-CM

## 2012-05-27 DIAGNOSIS — E119 Type 2 diabetes mellitus without complications: Secondary | ICD-10-CM

## 2012-05-27 DIAGNOSIS — L97509 Non-pressure chronic ulcer of other part of unspecified foot with unspecified severity: Secondary | ICD-10-CM

## 2012-05-27 DIAGNOSIS — E876 Hypokalemia: Secondary | ICD-10-CM

## 2012-05-27 DIAGNOSIS — I739 Peripheral vascular disease, unspecified: Secondary | ICD-10-CM

## 2012-05-27 DIAGNOSIS — Z87891 Personal history of nicotine dependence: Secondary | ICD-10-CM

## 2012-05-27 DIAGNOSIS — E785 Hyperlipidemia, unspecified: Secondary | ICD-10-CM

## 2012-05-27 DIAGNOSIS — I4891 Unspecified atrial fibrillation: Secondary | ICD-10-CM | POA: Diagnosis present

## 2012-05-27 DIAGNOSIS — L089 Local infection of the skin and subcutaneous tissue, unspecified: Secondary | ICD-10-CM

## 2012-05-27 DIAGNOSIS — J4489 Other specified chronic obstructive pulmonary disease: Secondary | ICD-10-CM | POA: Diagnosis present

## 2012-05-27 DIAGNOSIS — M1A9XX1 Chronic gout, unspecified, with tophus (tophi): Secondary | ICD-10-CM | POA: Diagnosis present

## 2012-05-27 DIAGNOSIS — B950 Streptococcus, group A, as the cause of diseases classified elsewhere: Secondary | ICD-10-CM | POA: Diagnosis present

## 2012-05-27 DIAGNOSIS — I10 Essential (primary) hypertension: Secondary | ICD-10-CM

## 2012-05-27 DIAGNOSIS — E039 Hypothyroidism, unspecified: Secondary | ICD-10-CM

## 2012-05-27 DIAGNOSIS — R768 Other specified abnormal immunological findings in serum: Secondary | ICD-10-CM

## 2012-05-27 DIAGNOSIS — I480 Paroxysmal atrial fibrillation: Secondary | ICD-10-CM

## 2012-05-27 DIAGNOSIS — I472 Ventricular tachycardia: Secondary | ICD-10-CM

## 2012-05-27 DIAGNOSIS — I2510 Atherosclerotic heart disease of native coronary artery without angina pectoris: Secondary | ICD-10-CM

## 2012-05-27 DIAGNOSIS — G4730 Sleep apnea, unspecified: Secondary | ICD-10-CM

## 2012-05-27 DIAGNOSIS — Z79899 Other long term (current) drug therapy: Secondary | ICD-10-CM

## 2012-05-27 DIAGNOSIS — IMO0002 Reserved for concepts with insufficient information to code with codable children: Principal | ICD-10-CM | POA: Diagnosis present

## 2012-05-27 DIAGNOSIS — E1165 Type 2 diabetes mellitus with hyperglycemia: Principal | ICD-10-CM | POA: Diagnosis present

## 2012-05-27 DIAGNOSIS — E11628 Type 2 diabetes mellitus with other skin complications: Secondary | ICD-10-CM

## 2012-05-27 DIAGNOSIS — Z794 Long term (current) use of insulin: Secondary | ICD-10-CM

## 2012-05-27 DIAGNOSIS — I252 Old myocardial infarction: Secondary | ICD-10-CM

## 2012-05-27 DIAGNOSIS — N184 Chronic kidney disease, stage 4 (severe): Secondary | ICD-10-CM

## 2012-05-27 DIAGNOSIS — L03116 Cellulitis of left lower limb: Secondary | ICD-10-CM

## 2012-05-27 DIAGNOSIS — E1159 Type 2 diabetes mellitus with other circulatory complications: Secondary | ICD-10-CM | POA: Diagnosis present

## 2012-05-27 DIAGNOSIS — M109 Gout, unspecified: Secondary | ICD-10-CM

## 2012-05-27 DIAGNOSIS — D509 Iron deficiency anemia, unspecified: Secondary | ICD-10-CM | POA: Diagnosis present

## 2012-05-27 DIAGNOSIS — I255 Ischemic cardiomyopathy: Secondary | ICD-10-CM

## 2012-05-27 DIAGNOSIS — I4729 Other ventricular tachycardia: Secondary | ICD-10-CM

## 2012-05-27 DIAGNOSIS — N039 Chronic nephritic syndrome with unspecified morphologic changes: Secondary | ICD-10-CM | POA: Diagnosis present

## 2012-05-27 DIAGNOSIS — Z951 Presence of aortocoronary bypass graft: Secondary | ICD-10-CM

## 2012-05-27 DIAGNOSIS — D638 Anemia in other chronic diseases classified elsewhere: Secondary | ICD-10-CM

## 2012-05-27 DIAGNOSIS — E11621 Type 2 diabetes mellitus with foot ulcer: Secondary | ICD-10-CM | POA: Diagnosis present

## 2012-05-27 DIAGNOSIS — I2589 Other forms of chronic ischemic heart disease: Secondary | ICD-10-CM | POA: Diagnosis present

## 2012-05-27 DIAGNOSIS — J449 Chronic obstructive pulmonary disease, unspecified: Secondary | ICD-10-CM | POA: Diagnosis present

## 2012-05-27 DIAGNOSIS — I129 Hypertensive chronic kidney disease with stage 1 through stage 4 chronic kidney disease, or unspecified chronic kidney disease: Secondary | ICD-10-CM | POA: Diagnosis present

## 2012-05-27 DIAGNOSIS — D631 Anemia in chronic kidney disease: Secondary | ICD-10-CM | POA: Diagnosis present

## 2012-05-27 DIAGNOSIS — I5023 Acute on chronic systolic (congestive) heart failure: Secondary | ICD-10-CM

## 2012-05-27 DIAGNOSIS — Z95810 Presence of automatic (implantable) cardiac defibrillator: Secondary | ICD-10-CM

## 2012-05-27 HISTORY — DX: Hypothyroidism, unspecified: E03.9

## 2012-05-27 HISTORY — DX: Gout, unspecified: M10.9

## 2012-05-27 HISTORY — DX: Paroxysmal atrial fibrillation: I48.0

## 2012-05-27 LAB — DIFFERENTIAL
Basophils Relative: 0 % (ref 0–1)
Eosinophils Relative: 1 % (ref 0–5)
Monocytes Absolute: 1.1 10*3/uL — ABNORMAL HIGH (ref 0.1–1.0)
Monocytes Relative: 10 % (ref 3–12)
Neutro Abs: 8.1 10*3/uL — ABNORMAL HIGH (ref 1.7–7.7)

## 2012-05-27 LAB — BASIC METABOLIC PANEL
BUN: 54 mg/dL — ABNORMAL HIGH (ref 6–23)
CO2: 28 mEq/L (ref 19–32)
Calcium: 10 mg/dL (ref 8.4–10.5)
Chloride: 99 mEq/L (ref 96–112)
Creatinine, Ser: 1.94 mg/dL — ABNORMAL HIGH (ref 0.50–1.35)
GFR calc Af Amer: 37 mL/min — ABNORMAL LOW (ref >90)

## 2012-05-27 LAB — CBC
HCT: 33.3 % — ABNORMAL LOW (ref 39.0–52.0)
Hemoglobin: 11 g/dL — ABNORMAL LOW (ref 13.0–17.0)
MCHC: 33 g/dL (ref 30.0–36.0)
MCV: 89 fL (ref 78.0–100.0)

## 2012-05-27 LAB — GLUCOSE, CAPILLARY

## 2012-05-27 MED ORDER — SIMVASTATIN 20 MG PO TABS
40.0000 mg | ORAL_TABLET | Freq: Every day | ORAL | Status: DC
  Administered 2012-05-28 – 2012-05-29 (×2): 40 mg via ORAL
  Filled 2012-05-27 (×2): qty 2

## 2012-05-27 MED ORDER — WARFARIN - PHYSICIAN DOSING INPATIENT
Freq: Every day | Status: DC
  Administered 2012-05-28 – 2012-05-29 (×2)

## 2012-05-27 MED ORDER — SODIUM CHLORIDE 0.9 % IV SOLN
1500.0000 mg | INTRAVENOUS | Status: DC
  Administered 2012-05-28 – 2012-05-29 (×2): 1500 mg via INTRAVENOUS
  Filled 2012-05-27 (×5): qty 1500

## 2012-05-27 MED ORDER — WARFARIN SODIUM 5 MG PO TABS
5.0000 mg | ORAL_TABLET | ORAL | Status: DC
  Administered 2012-05-28 – 2012-05-29 (×2): 5 mg via ORAL
  Filled 2012-05-27 (×2): qty 1

## 2012-05-27 MED ORDER — HYDROCODONE-ACETAMINOPHEN 5-325 MG PO TABS
1.0000 | ORAL_TABLET | ORAL | Status: DC | PRN
  Administered 2012-05-28 – 2012-05-30 (×4): 1 via ORAL
  Filled 2012-05-27 (×4): qty 1

## 2012-05-27 MED ORDER — WARFARIN SODIUM 2.5 MG PO TABS
2.5000 mg | ORAL_TABLET | ORAL | Status: DC
  Administered 2012-05-27: 2.5 mg via ORAL
  Filled 2012-05-27 (×3): qty 1

## 2012-05-27 MED ORDER — VANCOMYCIN HCL 500 MG IV SOLR
INTRAVENOUS | Status: AC
  Filled 2012-05-27: qty 500

## 2012-05-27 MED ORDER — SODIUM CHLORIDE 0.9 % IJ SOLN
3.0000 mL | Freq: Two times a day (BID) | INTRAMUSCULAR | Status: DC
  Administered 2012-05-27 – 2012-05-30 (×5): 3 mL via INTRAVENOUS
  Filled 2012-05-27 (×4): qty 3

## 2012-05-27 MED ORDER — ISOSORBIDE DINITRATE 20 MG PO TABS
20.0000 mg | ORAL_TABLET | Freq: Three times a day (TID) | ORAL | Status: DC
  Administered 2012-05-27 – 2012-05-30 (×8): 20 mg via ORAL
  Filled 2012-05-27 (×8): qty 1

## 2012-05-27 MED ORDER — SODIUM CHLORIDE 0.9 % IV SOLN
Freq: Once | INTRAVENOUS | Status: AC
  Administered 2012-05-27: 1000 mL via INTRAVENOUS

## 2012-05-27 MED ORDER — FUROSEMIDE 80 MG PO TABS
80.0000 mg | ORAL_TABLET | Freq: Two times a day (BID) | ORAL | Status: DC
  Administered 2012-05-28 – 2012-05-30 (×5): 80 mg via ORAL
  Filled 2012-05-27 (×5): qty 1

## 2012-05-27 MED ORDER — HYDRALAZINE HCL 25 MG PO TABS
25.0000 mg | ORAL_TABLET | Freq: Three times a day (TID) | ORAL | Status: DC
  Administered 2012-05-27 – 2012-05-30 (×8): 25 mg via ORAL
  Filled 2012-05-27 (×8): qty 1

## 2012-05-27 MED ORDER — SODIUM CHLORIDE 0.9 % IV SOLN
250.0000 mL | INTRAVENOUS | Status: DC | PRN
  Administered 2012-05-28: 250 mL via INTRAVENOUS

## 2012-05-27 MED ORDER — SODIUM CHLORIDE 0.9 % IJ SOLN
3.0000 mL | INTRAMUSCULAR | Status: DC | PRN
  Filled 2012-05-27: qty 3

## 2012-05-27 MED ORDER — INSULIN GLARGINE 100 UNIT/ML ~~LOC~~ SOLN
80.0000 [IU] | Freq: Every day | SUBCUTANEOUS | Status: DC
  Administered 2012-05-28 – 2012-05-29 (×2): 80 [IU] via SUBCUTANEOUS

## 2012-05-27 MED ORDER — VANCOMYCIN HCL IN DEXTROSE 1-5 GM/200ML-% IV SOLN
1000.0000 mg | Freq: Once | INTRAVENOUS | Status: AC
  Administered 2012-05-27: 1000 mg via INTRAVENOUS
  Filled 2012-05-27: qty 200

## 2012-05-27 MED ORDER — POTASSIUM CHLORIDE CRYS ER 20 MEQ PO TBCR
40.0000 meq | EXTENDED_RELEASE_TABLET | Freq: Two times a day (BID) | ORAL | Status: DC
  Administered 2012-05-27 – 2012-05-30 (×6): 40 meq via ORAL
  Filled 2012-05-27 (×6): qty 2

## 2012-05-27 MED ORDER — METOLAZONE 5 MG PO TABS
10.0000 mg | ORAL_TABLET | ORAL | Status: DC
Start: 2012-05-29 — End: 2012-05-30
  Administered 2012-05-29: 10 mg via ORAL
  Filled 2012-05-27: qty 2

## 2012-05-27 MED ORDER — ALLOPURINOL 300 MG PO TABS
450.0000 mg | ORAL_TABLET | Freq: Every day | ORAL | Status: DC
  Administered 2012-05-28 – 2012-05-30 (×3): 450 mg via ORAL
  Filled 2012-05-27: qty 1
  Filled 2012-05-27 (×2): qty 2

## 2012-05-27 MED ORDER — LEVOTHYROXINE SODIUM 50 MCG PO TABS
50.0000 ug | ORAL_TABLET | Freq: Every day | ORAL | Status: DC
  Administered 2012-05-28 – 2012-05-30 (×3): 50 ug via ORAL
  Filled 2012-05-27 (×3): qty 1

## 2012-05-27 MED ORDER — CARVEDILOL 3.125 MG PO TABS: 90.0000 mg | ORAL_TABLET | Freq: Two times a day (BID) | ORAL | Status: DC

## 2012-05-27 MED ORDER — INSULIN ASPART 100 UNIT/ML ~~LOC~~ SOLN
20.0000 [IU] | Freq: Three times a day (TID) | SUBCUTANEOUS | Status: DC
  Administered 2012-05-28 – 2012-05-29 (×4): 20 [IU] via SUBCUTANEOUS

## 2012-05-27 MED ORDER — VANCOMYCIN HCL 500 MG IV SOLR
500.0000 mg | Freq: Once | INTRAVENOUS | Status: AC
  Administered 2012-05-27: 500 mg via INTRAVENOUS
  Filled 2012-05-27: qty 500

## 2012-05-27 MED ORDER — WARFARIN SODIUM 2.5 MG PO TABS: 2.5000 mg | ORAL_TABLET | Freq: Every evening | ORAL | Status: DC

## 2012-05-27 NOTE — ED Notes (Signed)
Chest xray to be cancelled. Radiology aware. Per vo dr Adriana Simas

## 2012-05-27 NOTE — ED Notes (Signed)
Patient lying comfortably on stretcher.  Patient states he feels better now; denies any pain.

## 2012-05-27 NOTE — ED Notes (Signed)
Pt brought to er by family due to swelling of left foot and ?necrosis area to the bottom of the foot with drainage and odor, pt states that the swelling started about a week ago,

## 2012-05-27 NOTE — ED Provider Notes (Signed)
This chart was scribed for Donnetta Hutching, MD by Wallis Mart. The patient was seen in room APA04/APA04 and the patient's care was started at 6:08 PM.   CSN: [id number]  Arrival date & time 05/27/12  1633   First MD Initiated Contact with Patient 05/27/12 1651      Chief Complaint  Patient presents with  . Foot Swelling    (Consider location/radiation/quality/duration/timing/severity/associated sxs/prior treatment) HPI  [name] is a 77 y.o. male who presents to the Emergency Department complaining of sudden onset, persistence of constant, gradually worsening, moderate swelling and pain to left foot onset 2 and a half weeks ago. Pt has an ulceration w/ drainage and odor to bottom of left foot that he did not know was there. Pt w/ h/o DM. There are no other associated symptoms and no other alleviating or aggravating factors.    PCP: Phillips Odor  Past Medical History  Diagnosis Date  . Shortness of breath   . Anemia   . Chronic kidney disease     renal insufficiency  . CHF (congestive heart failure)   . Dysrhythmia     atrial fibrilation  . Myocardial infarction   . COPD (chronic obstructive pulmonary disease)   . Diabetes mellitus   . Coronary artery disease     Past Surgical History  Procedure Date  . Coronary artery bypass graft     No family history on file.  History  Substance Use Topics  . Smoking status: Former Smoker    Quit date: 11/05/2003  . Smokeless tobacco: Never Used  . Alcohol Use: No     occasional      Review of Systems  10 Systems reviewed and all are negative for acute change except as noted in the HPI.    Allergies  Review of patient's allergies indicates no known allergies.  Home Medications   Current Outpatient Rx  Name Route Sig Dispense Refill  . ALLOPURINOL 300 MG PO TABS Oral Take 450 mg by mouth every morning.     Marland Kitchen CARVEDILOL 12.5 MG PO TABS Oral Take 90 mg by mouth 2 (two) times daily with a meal.    . FUROSEMIDE 80 MG  PO TABS Oral Take 80 mg by mouth 2 (two) times daily.    Marland Kitchen HYDRALAZINE HCL 25 MG PO TABS Oral Take 25 mg by mouth 3 (three) times daily.    . INSULIN ASPART 100 UNIT/ML [place] SOLN Subcutaneous Inject 20-25 Units into the skin 3 (three) times daily before meals. Takes 20 units in the morning, 25 units at lunch,  & 25 units in the evening     . INSULIN GLARGINE 100 UNIT/ML [place] SOLN Subcutaneous Inject 80 Units into the skin daily. 10 mL 0  . ISOSORBIDE DINITRATE 20 MG PO TABS Oral Take 20 mg by mouth 3 (three) times daily.    Marland Kitchen LEVOTHYROXINE SODIUM 50 MCG PO TABS Oral Take 50 mcg by mouth every morning.     Marland Kitchen METOLAZONE 10 MG PO TABS Oral Take 10 mg by mouth every other day. Takes before taking Lasix (Furosemide) 80mg     . OMEPRAZOLE 20 MG PO CPDR Oral Take 40 mg by mouth every morning.     Marland Kitchen POTASSIUM CHLORIDE CRYS ER 20 MEQ PO TBCR Oral Take 2 tablets (40 mEq total) by mouth 2 (two) times daily. 100 tablet 5  . SIMVASTATIN 40 MG PO TABS Oral Take 40 mg by mouth at bedtime.      . WARFARIN SODIUM  5 MG PO TABS Oral Take 2.5-5 mg by mouth every evening. Takes one tablet (5mg ) every day except on Tuesdays and Fridays, take one-half tablet (2.5mg )      BP 153/68  Pulse 97  Temp 98.3 F (36.8 C) (Oral)  Resp 20  Ht 6\' 2"  (1.88 m)  Wt 238 lb (107.956 kg)  BMI 30.56 kg/m2  SpO2 97%  Physical Exam  Nursing note and vitals reviewed. Constitutional: He is oriented to person, place, and time. He appears well-developed and well-nourished. No distress.  HENT:  Head: Normocephalic and atraumatic.  Eyes: EOM are normal. Pupils are equal, round, and reactive to light.  Neck: Neck supple. No tracheal deviation present.  Cardiovascular: Normal rate and regular rhythm.   Pulmonary/Chest: Effort normal and breath sounds normal. No respiratory distress.  Abdominal: Soft. He exhibits no distension.  Musculoskeletal: Normal range of motion. He exhibits edema.       2.5 cm ulceration on left heel w/ a small  puss drainage from central part of wound , left lower extremity is larger than the right lower extremity, puffy, bilateral venous stasis ulcers  Neurological: He is alert and oriented to person, place, and time. No sensory deficit.  Skin: Skin is warm and dry.  Psychiatric: He has a normal mood and affect. His behavior is normal.    ED Course  Procedures (including critical care time) DIAGNOSTIC STUDIES: Oxygen Saturation is 100% on room air, normal by my interpretation.      COORDINATION OF CARE:  6:17 PM: Course of treatment: Check bloodwork, IV antibiotics   Labs Reviewed  CBC - Abnormal; Notable for the following:    WBC 10.8 (*)     RBC 3.74 (*)     Hemoglobin 11.0 (*)     HCT 33.3 (*)     All other components within normal limits  DIFFERENTIAL - Abnormal; Notable for the following:    Neutro Abs 8.1 (*)     Monocytes Absolute 1.1 (*)     All other components within normal limits  BASIC METABOLIC PANEL - Abnormal; Notable for the following:    Potassium 3.0 (*)     Glucose, Bld 64 (*)     BUN 54 (*)     Creatinine, Ser 1.94 (*)     GFR calc non Af Amer 32 (*)     GFR calc Af Amer 37 (*)     All other components within normal limits  GLUCOSE, CAPILLARY   Dg Foot Complete Left  05/27/2012  *RADIOLOGY REPORT*  Clinical Data: Left foot swelling.  Soft tissue ulceration on the plantar aspect of the left heel.  Cellulitis.  LEFT FOOT - COMPLETE 3+ VIEW  Comparison: Radiographs dated 09/03/2007  Findings: There is a soft tissue ulceration on the plantar aspect the heel.  There is no underlying osteomyelitis.  The patient has destruction of the head of the first metatarsal and the base of the proximal phalangeal bone of the great toe.  There are large erosions at the interphalangeal joint of the great toe. This is consistent with extensive gout.  There is an old deformity of the distal aspect of the proximal phalangeal bone of the second toe consistent with a remote fracture, new  since 09/03/2007.  There is slight degenerative arthritis in the tarsal joints and at the metatarsal phalangeal joints with erosions of the bases of the fourth and fifth metatarsals consistent with gout.  There is marked soft tissue swelling on the dorsum of  the foot. Extensive arterial vascular calcification is present.  Slight degenerative changes of the distal tibia.  IMPRESSION:  1.  Soft tissue ulceration on the plantar aspect of the heel without visible underlying osteomyelitis. 2.  Bone destruction of the first metatarsal phalangeal joint secondary to gout. 3.  Multiple erosions elsewhere in the foot consistent with gout.  Original Report Authenticated By: Gwynn Burly, M.D.     No diagnosis found.    MDM  Elderly diabetic male with left foot ulcer/cellulitis.  IV vancomycin. Admit.   I personally performed the services described in this documentation, which was scribed in my presence. The recorded information has been reviewed and considered.      Donnetta Hutching, MD 05/27/12 2026

## 2012-05-27 NOTE — H&P (Signed)
PCP:   Colette Ribas, MD   Chief Complaint:  Left foot   woundand swelling  HPI:  77 year old diabetic male who noticed that his left foot and swelling for the last 3 days his daughter came over and took off his shoes and socks and noted a foul-smelling similar to his left heel. Patient had not noticed that it was and is unknown how long it has been. He's been having some purulent discharge. He denies any fevers or any other symptoms such as nausea vomiting or diarrhea. He has not recently been on any antibiotics.  Review of Systems:  Otherwise negative  Past Medical History: Past Medical History  Diagnosis Date  . Shortness of breath   . Anemia   . Chronic kidney disease     renal insufficiency  . CHF (congestive heart failure)   . Dysrhythmia     atrial fibrilation  . Myocardial infarction   . COPD (chronic obstructive pulmonary disease)   . Diabetes mellitus   . Coronary artery disease    Past Surgical History  Procedure Date  . Coronary artery bypass graft     Medications: Prior to Admission medications   Medication Sig Start Date End Date Taking? Authorizing Provider  allopurinol (ZYLOPRIM) 300 MG tablet Take 450 mg by mouth every morning.    Yes Historical Provider, MD  carvedilol (COREG) 12.5 MG tablet Take 90 mg by mouth 2 (two) times daily with a meal.   Yes Historical Provider, MD  furosemide (LASIX) 80 MG tablet Take 80 mg by mouth 2 (two) times daily.   Yes Historical Provider, MD  hydrALAZINE (APRESOLINE) 25 MG tablet Take 25 mg by mouth 3 (three) times daily.   Yes Historical Provider, MD  insulin aspart (NOVOLOG) 100 UNIT/ML injection Inject 20-25 Units into the skin 3 (three) times daily before meals. Takes 20 units in the morning, 25 units at lunch,  & 25 units in the evening    Yes Historical Provider, MD  insulin glargine (LANTUS) 100 UNIT/ML injection Inject 80 Units into the skin daily. 11/14/11  Yes Abelino Derrick, PA  isosorbide dinitrate  (ISORDIL) 20 MG tablet Take 20 mg by mouth 3 (three) times daily.   Yes Historical Provider, MD  levothyroxine (SYNTHROID, LEVOTHROID) 50 MCG tablet Take 50 mcg by mouth every morning.    Yes Historical Provider, MD  metolazone (ZAROXOLYN) 10 MG tablet Take 10 mg by mouth every other day. Takes before taking Lasix (Furosemide) 80mg  11/14/11 11/13/12 Yes Abelino Derrick, PA  omeprazole (PRILOSEC) 20 MG capsule Take 40 mg by mouth every morning.    Yes Historical Provider, MD  potassium chloride SA (K-DUR,KLOR-CON) 20 MEQ tablet Take 2 tablets (40 mEq total) by mouth 2 (two) times daily. 11/14/11 11/13/12 Yes Abelino Derrick, PA  simvastatin (ZOCOR) 40 MG tablet Take 40 mg by mouth at bedtime.     Yes Historical Provider, MD  warfarin (COUMADIN) 5 MG tablet Take 2.5-5 mg by mouth every evening. Takes one tablet (5mg ) every day except on Tuesdays and Fridays, take one-half tablet (2.5mg )   Yes Historical Provider, MD    Allergies:  No Known Allergies  Social History:  reports that he quit smoking about 8 years ago. He has never used smokeless tobacco. He reports that he does not drink alcohol or use illicit drugs.  Physical Exam: Filed Vitals:   05/27/12 1634 05/27/12 1839  BP: 153/68 148/74  Pulse: 97 84  Temp: 98.3 F (36.8 C)  TempSrc: Oral   Resp: 20 19  Height: 6\' 2"  (1.88 m)   Weight: 107.956 kg (238 lb)   SpO2: 97% 98%   General appearance: alert, cooperative and no distress Lungs: clear to auscultation bilaterally Heart: regular rate and rhythm, S1, S2 normal, no murmur, click, rub or gallop Abdomen: soft, non-tender; bowel sounds normal; no masses,  no organomegaly Extremities: edema swelling lle more so than rlt.  wound to left heal of foot with foul smell no surrounding erythema Pulses: 2+ and symmetric Skin: Skin color, texture, turgor normal. No rashes or lesions Neurologic: Grossly normal    Labs on Admission:   [facility] 05/27/12 1845  NA 141  K 3.0*  CL 99  CO2  28  GLUCOSE 64*  BUN 54*  CREATININE 1.94*  CALCIUM 10.0  MG --  PHOS --    Basename 05/27/12 1845  WBC 10.8*  NEUTROABS 8.1*  HGB 11.0*  HCT 33.3*  MCV 89.0  PLT 195   Radiological Exams on Admission: Dg Foot Complete Left  05/27/2012  *RADIOLOGY REPORT*  Clinical Data: Left foot swelling.  Soft tissue ulceration on the plantar aspect of the left heel.  Cellulitis.  LEFT FOOT - COMPLETE 3+ VIEW  Comparison: Radiographs dated 09/03/2007  Findings: There is a soft tissue ulceration on the plantar aspect the heel.  There is no underlying osteomyelitis.  The patient has destruction of the head of the first metatarsal and the base of the proximal phalangeal bone of the great toe.  There are large erosions at the interphalangeal joint of the great toe. This is consistent with extensive gout.  There is an old deformity of the distal aspect of the proximal phalangeal bone of the second toe consistent with a remote fracture, new since 09/03/2007.  There is slight degenerative arthritis in the tarsal joints and at the metatarsal phalangeal joints with erosions of the bases of the fourth and fifth metatarsals consistent with gout.  There is marked soft tissue swelling on the dorsum of the foot. Extensive arterial vascular calcification is present.  Slight degenerative changes of the distal tibia.  IMPRESSION:  1.  Soft tissue ulceration on the plantar aspect of the heel without visible underlying osteomyelitis. 2.  Bone destruction of the first metatarsal phalangeal joint secondary to gout. 3.  Multiple erosions elsewhere in the foot consistent with gout.  Original Report Authenticated By: Gwynn Burly, M.D.    Assessment/Plan Present on Admission:  77 year old male with left foot diabetic infection  .Diabetic foot infection .CAD (coronary artery disease) .Anemia due to chronic illness .ICD (implantable cardiac defibrillator) in place .Ischemic cardiomyopathy, EF 25% .Peripheral vascular  disease, unspecified .CKD (chronic kidney disease) stage 4, GFR 15-29 ml/min  Place patient on vancomycin. Obtain wound care consultation and general surgery consultation.    [name] [phone] 05/27/2012, 8:26 PM

## 2012-05-27 NOTE — ED Notes (Signed)
Report given to Huntley Dec, RN on Unit 300.

## 2012-05-27 NOTE — Progress Notes (Signed)
ANTIBIOTIC CONSULT NOTE - INITIAL  Pharmacy Consult for Vancomycin Indication: cellulitis  No Known Allergies  Patient Measurements: Height: 6\' 2"  (188 cm) Weight: 238 lb (107.956 kg) IBW/kg (Calculated) : 82.2    Vital Signs: Temp: 99 F (37.2 C) (06/25 2206) Temp src: Oral (06/25 1634) BP: 111/49 mmHg (06/25 2206) Pulse Rate: 65  (06/25 2206) Intake/Output from previous day:   Intake/Output from this shift:    Labs:  [facility] 05/27/12 1845  WBC 10.8*  HGB 11.0*  PLT 195  LABCREA --  CREATININE 1.94*   Estimated Creatinine Clearance: 43.7 ml/min (by C-G formula based on Cr of 1.94). No results found for this basename: VANCOTROUGH:2,VANCOPEAK:2,VANCORANDOM:2,GENTTROUGH:2,GENTPEAK:2,GENTRANDOM:2,TOBRATROUGH:2,TOBRAPEAK:2,TOBRARND:2,AMIKACINPEAK:2,AMIKACINTROU:2,AMIKACIN:2, in the last 72 hours   Microbiology: No results found for this or any previous visit (from the past 720 hour(s)).  Medical History: Past Medical History  Diagnosis Date  . Shortness of breath   . Anemia   . Chronic kidney disease     renal insufficiency  . CHF (congestive heart failure)   . Dysrhythmia     atrial fibrilation  . Myocardial infarction   . COPD (chronic obstructive pulmonary disease)   . Diabetes mellitus   . Coronary artery disease     Medications:  Prescriptions prior to admission  Medication Sig Dispense Refill  . allopurinol (ZYLOPRIM) 300 MG tablet Take 450 mg by mouth every morning.       . carvedilol (COREG) 12.5 MG tablet Take 90 mg by mouth 2 (two) times daily with a meal.      . furosemide (LASIX) 80 MG tablet Take 80 mg by mouth 2 (two) times daily.      . hydrALAZINE (APRESOLINE) 25 MG tablet Take 25 mg by mouth 3 (three) times daily.      . insulin aspart (NOVOLOG) 100 UNIT/ML injection Inject 20-25 Units into the skin 3 (three) times daily before meals. Takes 20 units in the morning, 25 units at lunch,  & 25 units in the evening       . insulin glargine  (LANTUS) 100 UNIT/ML injection Inject 80 Units into the skin daily.  10 mL  0  . isosorbide dinitrate (ISORDIL) 20 MG tablet Take 20 mg by mouth 3 (three) times daily.      Marland Kitchen levothyroxine (SYNTHROID, LEVOTHROID) 50 MCG tablet Take 50 mcg by mouth every morning.       . metolazone (ZAROXOLYN) 10 MG tablet Take 10 mg by mouth every other day. Takes before taking Lasix (Furosemide) 80mg       . omeprazole (PRILOSEC) 20 MG capsule Take 40 mg by mouth every morning.       . potassium chloride SA (K-DUR,KLOR-CON) 20 MEQ tablet Take 2 tablets (40 mEq total) by mouth 2 (two) times daily.  100 tablet  5  . simvastatin (ZOCOR) 40 MG tablet Take 40 mg by mouth at bedtime.        Marland Kitchen warfarin (COUMADIN) 5 MG tablet Take 2.5-5 mg by mouth every evening. Takes one tablet (5mg ) every day except on Tuesdays and Fridays, take one-half tablet (2.5mg )       Scheduled:    . sodium chloride   Intravenous Once  . allopurinol  450 mg Oral BH-q7a  . carvedilol  90.625 mg Oral BID WC  . furosemide  80 mg Oral BID  . hydrALAZINE  25 mg Oral TID  . insulin aspart  20-25 Units Subcutaneous TID AC  . insulin glargine  80 Units Subcutaneous Daily  . isosorbide dinitrate  20 mg Oral TID  . levothyroxine  50 mcg Oral BH-q7a  . metolazone  10 mg Oral QODAY  . potassium chloride SA  40 mEq Oral BID  . simvastatin  40 mg Oral QHS  . sodium chloride  3 mL Intravenous Q12H  . vancomycin  1,500 mg Intravenous Q24H  . vancomycin  500 mg Intravenous Once  . vancomycin  1,000 mg Intravenous Once  . warfarin  2.5-5 mg Oral QPM   Assessment: Vancomycin 1 GM IV given in ER tonight CrCl 43  Goal of Therapy:  Vancomycin trough level 10-15 mcg/ml  Plan:  Additional Vancomycin 500 mg IV tonight, then Vancomycin 1500 mg IV every 24 hours starting tomorrow Labs per protocol Measure antibiotic drug levels at steady state Follow up culture results  Raquel James, Naesha Buckalew Bennett 05/27/2012,10:43 PM

## 2012-05-28 ENCOUNTER — Inpatient Hospital Stay (HOSPITAL_COMMUNITY): Payer: Medicare Other

## 2012-05-28 ENCOUNTER — Encounter (HOSPITAL_COMMUNITY): Payer: Self-pay | Admitting: Internal Medicine

## 2012-05-28 DIAGNOSIS — E039 Hypothyroidism, unspecified: Secondary | ICD-10-CM | POA: Diagnosis present

## 2012-05-28 DIAGNOSIS — L97509 Non-pressure chronic ulcer of other part of unspecified foot with unspecified severity: Secondary | ICD-10-CM

## 2012-05-28 DIAGNOSIS — M109 Gout, unspecified: Secondary | ICD-10-CM | POA: Diagnosis present

## 2012-05-28 DIAGNOSIS — I798 Other disorders of arteries, arterioles and capillaries in diseases classified elsewhere: Secondary | ICD-10-CM

## 2012-05-28 DIAGNOSIS — E1165 Type 2 diabetes mellitus with hyperglycemia: Secondary | ICD-10-CM

## 2012-05-28 DIAGNOSIS — M869 Osteomyelitis, unspecified: Secondary | ICD-10-CM

## 2012-05-28 LAB — BASIC METABOLIC PANEL
CO2: 27 mEq/L (ref 19–32)
Calcium: 9.4 mg/dL (ref 8.4–10.5)
Glucose, Bld: 184 mg/dL — ABNORMAL HIGH (ref 70–99)
Potassium: 3.4 mEq/L — ABNORMAL LOW (ref 3.5–5.1)
Sodium: 139 mEq/L (ref 135–145)

## 2012-05-28 LAB — GLUCOSE, CAPILLARY
Glucose-Capillary: 102 mg/dL — ABNORMAL HIGH (ref 70–99)
Glucose-Capillary: 53 mg/dL — ABNORMAL LOW (ref 70–99)

## 2012-05-28 LAB — CBC
HCT: 31.6 % — ABNORMAL LOW (ref 39.0–52.0)
Hemoglobin: 10.4 g/dL — ABNORMAL LOW (ref 13.0–17.0)
MCH: 29.4 pg (ref 26.0–34.0)
MCV: 89.3 fL (ref 78.0–100.0)
RBC: 3.54 MIL/uL — ABNORMAL LOW (ref 4.22–5.81)

## 2012-05-28 MED ORDER — PIPERACILLIN-TAZOBACTAM 3.375 G IVPB
3.3750 g | Freq: Three times a day (TID) | INTRAVENOUS | Status: DC
  Administered 2012-05-28 – 2012-05-30 (×7): 3.375 g via INTRAVENOUS
  Filled 2012-05-28 (×16): qty 50

## 2012-05-28 MED ORDER — CARVEDILOL 12.5 MG PO TABS
12.5000 mg | ORAL_TABLET | Freq: Two times a day (BID) | ORAL | Status: DC
  Administered 2012-05-28 – 2012-05-30 (×4): 12.5 mg via ORAL
  Filled 2012-05-28 (×4): qty 1

## 2012-05-28 MED ORDER — COLLAGENASE 250 UNIT/GM EX OINT
TOPICAL_OINTMENT | Freq: Every day | CUTANEOUS | Status: DC
  Administered 2012-05-28 – 2012-05-30 (×3): via TOPICAL
  Filled 2012-05-28: qty 30

## 2012-05-28 NOTE — Progress Notes (Signed)
Subjective: The patient has no complaints of chest pain or shortness of breath. He has some mild left heel pain. No subjective fever or chills.  Objective: Vital signs in last 24 hours: Filed Vitals:   05/27/12 1839 05/27/12 2122 05/27/12 2206 05/28/12 0524  BP: 148/74 142/50 111/49 178/61  Pulse: 84 108 65 62  Temp:  98.8 F (37.1 C) 99 F (37.2 C) 98.5 F (36.9 C)  TempSrc:      Resp: 19 20 20 20   Height:      Weight:      SpO2: 98% 95% 98% 99%    Intake/Output Summary (Last 24 hours) at 05/28/12 1136 Last data filed at 05/28/12 0804  Gross per 24 hour  Intake    399 ml  Output    850 ml  Net   -451 ml    Weight change:   Physical exam: General: Pleasant 77 year old Caucasian man sitting up in the recliner, in no acute distress. Lungs: Clear to auscultation bilaterally. Breathing is nonlabored. Heart: S1, S2, with occasional ectopic beats versus Irregular, irregular. Abdomen: Positive bowel sounds, soft, mildly obese, nontender, nondistended. Extremities: 3+ left foot edema. Left heel ulcerated lesion approximating 2 cm with small eschar and now surrounding induration; malodorous; no active purulent drainage currently. Left foot is mildly tender to palpation. Multiple varicosities around both ankles/feet. Trace of pedal edema on the right. Pedal pulses barely palpable. Neurologic: He is alert and oriented x3. Cranial nerves II through XII are grossly intact.  Lab Results: Basic Metabolic Panel:  Basename 05/28/12 0539 05/27/12 1845  NA 139 141  K 3.4* 3.0*  CL 99 99  CO2 27 28  GLUCOSE 184* 64*  BUN 52* 54*  CREATININE 1.78* 1.94*  CALCIUM 9.4 10.0  MG -- --  PHOS -- --   Liver Function Tests: No results found for this basename: AST:2,ALT:2,ALKPHOS:2,BILITOT:2,PROT:2,ALBUMIN:2 in the last 72 hours No results found for this basename: LIPASE:2,AMYLASE:2 in the last 72 hours No results found for this basename: AMMONIA:2 in the last 72 hours CBC:  Basename  05/28/12 0539 05/27/12 1845  WBC 10.0 10.8*  NEUTROABS -- 8.1*  HGB 10.4* 11.0*  HCT 31.6* 33.3*  MCV 89.3 89.0  PLT 194 195   Cardiac Enzymes: No results found for this basename: CKTOTAL:3,CKMB:3,CKMBINDEX:3,TROPONINI:3 in the last 72 hours BNP: No results found for this basename: PROBNP:3 in the last 72 hours D-Dimer: No results found for this basename: DDIMER:2 in the last 72 hours CBG:  Basename 05/28/12 1125 05/28/12 0727 05/27/12 1639  GLUCAP 237* 164* 82   Hemoglobin A1C: No results found for this basename: HGBA1C in the last 72 hours Fasting Lipid Panel: No results found for this basename: CHOL,HDL,LDLCALC,TRIG,CHOLHDL,LDLDIRECT in the last 72 hours Thyroid Function Tests: No results found for this basename: TSH,T4TOTAL,FREET4,T3FREE,THYROIDAB in the last 72 hours Anemia Panel: No results found for this basename: VITAMINB12,FOLATE,FERRITIN,TIBC,IRON,RETICCTPCT in the last 72 hours Coagulation:  Basename 05/28/12 0544  LABPROT 29.5*  INR 2.75*   Urine Drug Screen: Drugs of Abuse  No results found for this basename: labopia,  cocainscrnur,  labbenz,  amphetmu,  thcu,  labbarb    Alcohol Level: No results found for this basename: ETH:2 in the last 72 hours Urinalysis: No results found for this basename: COLORURINE:2,APPERANCEUR:2,LABSPEC:2,PHURINE:2,GLUCOSEU:2,HGBUR:2,BILIRUBINUR:2,KETONESUR:2,PROTEINUR:2,UROBILINOGEN:2,NITRITE:2,LEUKOCYTESUR:2 in the last 72 hours Misc. Labs:   Micro: No results found for this or any previous visit (from the past 240 hour(s)).  Studies/Results: Dg Foot Complete Left  05/27/2012  *RADIOLOGY REPORT*  Clinical Data: Left foot  swelling.  Soft tissue ulceration on the plantar aspect of the left heel.  Cellulitis.  LEFT FOOT - COMPLETE 3+ VIEW  Comparison: Radiographs dated 09/03/2007  Findings: There is a soft tissue ulceration on the plantar aspect the heel.  There is no underlying osteomyelitis.  The patient has destruction of the  head of the first metatarsal and the base of the proximal phalangeal bone of the great toe.  There are large erosions at the interphalangeal joint of the great toe. This is consistent with extensive gout.  There is an old deformity of the distal aspect of the proximal phalangeal bone of the second toe consistent with a remote fracture, new since 09/03/2007.  There is slight degenerative arthritis in the tarsal joints and at the metatarsal phalangeal joints with erosions of the bases of the fourth and fifth metatarsals consistent with gout.  There is marked soft tissue swelling on the dorsum of the foot. Extensive arterial vascular calcification is present.  Slight degenerative changes of the distal tibia.  IMPRESSION:  1.  Soft tissue ulceration on the plantar aspect of the heel without visible underlying osteomyelitis. 2.  Bone destruction of the first metatarsal phalangeal joint secondary to gout. 3.  Multiple erosions elsewhere in the foot consistent with gout.  Original Report Authenticated By: Gwynn Burly, M.D.    Medications:  Scheduled:    . sodium chloride   Intravenous Once  . allopurinol  450 mg Oral Q breakfast  . carvedilol  90.625 mg Oral BID WC  . collagenase   Topical Daily  . furosemide  80 mg Oral BID  . hydrALAZINE  25 mg Oral TID  . insulin aspart  20-25 Units Subcutaneous TID AC  . insulin glargine  80 Units Subcutaneous Q breakfast  . isosorbide dinitrate  20 mg Oral TID  . levothyroxine  50 mcg Oral Q breakfast  . metolazone  10 mg Oral QODAY  . piperacillin-tazobactam (ZOSYN)  IV  3.375 g Intravenous Q8H  . potassium chloride SA  40 mEq Oral BID  . simvastatin  40 mg Oral QHS  . sodium chloride  3 mL Intravenous Q12H  . vancomycin  1,500 mg Intravenous Q24H  . vancomycin  500 mg Intravenous Once  . vancomycin  1,000 mg Intravenous Once  . warfarin  2.5 mg Oral Custom  . warfarin  5 mg Oral Custom  . Warfarin - Physician Dosing Inpatient   Does not apply q1800    . DISCONTD: warfarin  2.5-5 mg Oral QPM   Continuous:  ZOX:WRUEAV chloride, HYDROcodone-acetaminophen, sodium chloride  Assessment: Principal Problem:  *Diabetic foot infection Active Problems:  CAD (coronary artery disease)  ICD (implantable cardiac defibrillator) in place  Diabetes mellitus type 2, insulin dependent  CKD (chronic kidney disease) stage 4, GFR 15-29 ml/min  Anemia due to chronic illness  HTN (hypertension)  Hypokalemia  Ischemic cardiomyopathy, EF 25%  Peripheral vascular disease, unspecified  Chronic anticoagulation  Gout    1. Diabetic foot ulcer/cellulitis. The x-ray of the foot revealed no obvious osteomyelitis although radiographically, this is not the ideal study. An MRI cannot be ordered because of his ICD. We'll continue vancomycin as ordered, but given his diabetic status, it is likely that the infection is polymicrobial. We'll add Zosyn. Apparently, a specimen was obtained in the emergency department and the culture is pending. Wound care consultation noted.  Radiographic findings of bony destruction of the left first metatarsal and multiple erosions in the foot, consistent with gout. We'll continue  allopurinol.  Type 2 diabetes mellitus. We'll continue Lantus and NovoLog as scheduled. Make adjustments accordingly.  Ischemic cardiomyopathy/ICD placement/CAD/paroxysmal atrial fibrillation. Currently stable on chronic medications, including Coumadin.  Also history of peripheral vascular disease.  Hypertension. His blood pressure is elevated this morning. He is on excellent antihypertensive medications. We'll continue to monitor for adjustments in dosing.  Stage IV chronic kidney disease. His creatinine is actually better than baseline. It was 2.7 in December 2012.  Anemia of chronic disease.  Chronic hypokalemia, in part secondary to Lasix. Continue repletion and daily monitoring of serum potassium.    Plan: 1. What had Zosyn. Continue  vancomycin. We'll check the wound culture results pending. 2. Wound care consultation noted. We'll consult general surgery. 3. We'll order a CT scan of his left foot to see if there is any deeper infection. MRI cannot be done because of the ICD. 4. We'll order lower extremity venous Doppler to rule out DVT. 5. We'll order an anemia panel and other labs in the morning.   LOS: 1 day   Alayia Meggison 05/28/2012, 11:36 AM

## 2012-05-28 NOTE — Progress Notes (Signed)
UR Chart Review Completed  

## 2012-05-28 NOTE — Care Management Note (Signed)
    Page 1 of 1   05/30/2012     11:23:42 AM   CARE MANAGEMENT NOTE 05/30/2012  Patient:  [name], [name]   Account Number:  [id number]  Date Initiated:  05/28/2012  Documentation initiated by:  Rosemary Holms  Subjective/Objective Assessment:   Pt admitted from home where he lives alone. Pt has cellulitis of of R. leg/foot. Per pt, his daughter and granddaughter are very supportive. Daughter works third shift but his granddaughter is usually available after 3 or 4.     Action/Plan:   Pt's granddaughter Herbert Seta can be reached at [phone]. She can be trained per pt for wound care or IV antibiotic. Will await DC order and set up [facility] as needed.   Anticipated DC Date:  05/30/2012   Anticipated DC Plan:  HOME W HOME HEALTH SERVICES      DC Planning Services  CM consult      Choice offered to / List presented to:  C-1 Patient        HH arranged  HH-1 RN  HH-10 DISEASE MANAGEMENT      HH agency  [facility]   Status of service:  Completed, signed off Medicare Important Message given?   (If response is "NO", the following Medicare IM given date fields will be blank) Date Medicare IM given:   Date Additional Medicare IM given:    Discharge Disposition:  HOME W HOME HEALTH SERVICES  Per UR Regulation:    If discussed at Long Length of Stay Meetings, dates discussed:    Comments:  05/30/12 1100 Daryana Whirley RN BSN CM Pt dc'ing with HH RN from [facility] for wound care. Granddaughter, Herbert Seta will be able to learn how to change dressing. RN will be able to come out on Sunday.  05/28/12 1130 Ramirez Fullbright Leanord Hawking RN BSN CM

## 2012-05-28 NOTE — Consult Note (Signed)
WOC consult Note Reason for Consult:Consult performed via remote camera. Consult requested for left plantar heel wound.  Pt states it started draining and smelling about a week ago. Xray does not show osteomyelitis. Pt has multiple systemic factors which may impair wound healing; hx gout, degenerative arthritis, PVD. Wound type:Full thickness wound Measurement:2X2X.3cm Wound bed:100% soft eschar, no erythremia surrounding, strong odor.  Dressing procedure/placement/frequency:Santyl ointment to chemically debride nonviable tissue.  RECOMMEND FOLLOW-UP AS OUTPATIENT BY ORTHO SERVICE OR PODIATRY TO ASSIST WITH DEBRIDEMENT AS NONVIABLE TISSUE SOFTENS.  PT COULD BENEFIT FROM HOME HEALTH ASSISTANCE AFTER D/C FOR DRESSING CHANGE ASSISTANCE. Will not plan to follow further unless re-consulted.  [address], RN, MSN, [employer]  [phone]

## 2012-05-28 NOTE — Progress Notes (Signed)
ANTIBIOTIC CONSULT NOTE - INITIAL  Pharmacy Consult for Vancomycin and Zosyn Indication: cellulitis, diabetic foot infection Zosyn added 6/26 12 noon  No Known Allergies  Patient Measurements: Height: 6\' 2"  (188 cm) Weight: 238 lb (107.956 kg) IBW/kg (Calculated) : 82.2   Vital Signs: Temp: 98.5 F (36.9 C) (06/26 0524) BP: 178/61 mmHg (06/26 0524) Pulse Rate: 62  (06/26 0524) Intake/Output from previous day: 06/25 0701 - 06/26 0700 In: 159 [I.V.:159] Out: 400 [Urine:400] Intake/Output from this shift: Total I/O In: 240 [P.O.:240] Out: 450 [Urine:450]  Labs:  [facility] 05/28/12 0539 05/27/12 1845  WBC 10.0 10.8*  HGB 10.4* 11.0*  PLT 194 195  LABCREA -- --  CREATININE 1.78* 1.94*   Estimated Creatinine Clearance: 47.6 ml/min (by C-G formula based on Cr of 1.78). No results found for this basename: VANCOTROUGH:2,VANCOPEAK:2,VANCORANDOM:2,GENTTROUGH:2,GENTPEAK:2,GENTRANDOM:2,TOBRATROUGH:2,TOBRAPEAK:2,TOBRARND:2,AMIKACINPEAK:2,AMIKACINTROU:2,AMIKACIN:2, in the last 72 hours   Microbiology: No results found for this or any previous visit (from the past 720 hour(s)).  Medical History: Past Medical History  Diagnosis Date  . Shortness of breath   . Anemia   . Chronic kidney disease     renal insufficiency  . CHF (congestive heart failure)   . Dysrhythmia     atrial fibrilation  . Myocardial infarction   . COPD (chronic obstructive pulmonary disease)   . Diabetes mellitus   . Coronary artery disease    Medications:  Prescriptions prior to admission  Medication Sig Dispense Refill  . allopurinol (ZYLOPRIM) 300 MG tablet Take 450 mg by mouth every morning.       . carvedilol (COREG) 25 MG tablet Take 25 mg by mouth 2 (two) times daily.      . furosemide (LASIX) 80 MG tablet Take 80 mg by mouth 2 (two) times daily.      . hydrALAZINE (APRESOLINE) 25 MG tablet Take 25 mg by mouth 3 (three) times daily.      . insulin aspart (NOVOLOG) 100 UNIT/ML injection Inject  20-25 Units into the skin 3 (three) times daily before meals. Takes 20 units in the morning, 25 units at lunch,  & 25 units in the evening       . insulin glargine (LANTUS) 100 UNIT/ML injection Inject 80 Units into the skin daily.  10 mL  0  . isosorbide dinitrate (ISORDIL) 20 MG tablet Take 20 mg by mouth 3 (three) times daily.      Marland Kitchen levothyroxine (SYNTHROID, LEVOTHROID) 50 MCG tablet Take 50 mcg by mouth every morning.       . metolazone (ZAROXOLYN) 10 MG tablet Take 10 mg by mouth every other day. Takes before taking Lasix (Furosemide) 80mg       . omeprazole (PRILOSEC) 20 MG capsule Take 40 mg by mouth every morning.       . potassium chloride SA (K-DUR,KLOR-CON) 20 MEQ tablet Take 2 tablets (40 mEq total) by mouth 2 (two) times daily.  100 tablet  5  . simvastatin (ZOCOR) 40 MG tablet Take 40 mg by mouth at bedtime.        Marland Kitchen warfarin (COUMADIN) 5 MG tablet Take 2.5-5 mg by mouth every evening. Takes one tablet (5mg ) every day except on Tuesdays and Fridays, take one-half tablet (2.5mg )       Scheduled:     . sodium chloride   Intravenous Once  . allopurinol  450 mg Oral Q breakfast  . carvedilol  90.625 mg Oral BID WC  . collagenase   Topical Daily  . furosemide  80 mg Oral BID  .  hydrALAZINE  25 mg Oral TID  . insulin aspart  20-25 Units Subcutaneous TID AC  . insulin glargine  80 Units Subcutaneous Q breakfast  . isosorbide dinitrate  20 mg Oral TID  . levothyroxine  50 mcg Oral Q breakfast  . metolazone  10 mg Oral QODAY  . piperacillin-tazobactam (ZOSYN)  IV  3.375 g Intravenous Q8H  . potassium chloride SA  40 mEq Oral BID  . simvastatin  40 mg Oral QHS  . sodium chloride  3 mL Intravenous Q12H  . vancomycin  1,500 mg Intravenous Q24H  . vancomycin  500 mg Intravenous Once  . vancomycin  1,000 mg Intravenous Once  . warfarin  2.5 mg Oral Custom  . warfarin  5 mg Oral Custom  . Warfarin - Physician Dosing Inpatient   Does not apply q1800  . DISCONTD: warfarin  2.5-5 mg  Oral QPM   Assessment: Estimated Creatinine Clearance: 47.6 ml/min (by C-G formula based on Cr of 1.78).  Goal of Therapy:  Vancomycin trough level 10-15 mcg/ml  Plan: Zosyn 3.375gm iv q8hrs Vancomycin 1500mg  iv q24hrs (started 6/25) Check trough at steady state Labs per protocol  Valrie Hart A 05/28/2012,11:01 AM

## 2012-05-29 ENCOUNTER — Inpatient Hospital Stay (HOSPITAL_COMMUNITY): Payer: Medicare Other

## 2012-05-29 DIAGNOSIS — L97509 Non-pressure chronic ulcer of other part of unspecified foot with unspecified severity: Secondary | ICD-10-CM

## 2012-05-29 DIAGNOSIS — M869 Osteomyelitis, unspecified: Secondary | ICD-10-CM

## 2012-05-29 DIAGNOSIS — E1165 Type 2 diabetes mellitus with hyperglycemia: Secondary | ICD-10-CM

## 2012-05-29 DIAGNOSIS — I798 Other disorders of arteries, arterioles and capillaries in diseases classified elsewhere: Secondary | ICD-10-CM

## 2012-05-29 LAB — GLUCOSE, CAPILLARY: Glucose-Capillary: 211 mg/dL — ABNORMAL HIGH (ref 70–99)

## 2012-05-29 LAB — CBC
HCT: 33.1 % — ABNORMAL LOW (ref 39.0–52.0)
Hemoglobin: 10.9 g/dL — ABNORMAL LOW (ref 13.0–17.0)
MCV: 89 fL (ref 78.0–100.0)
RDW: 15.4 % (ref 11.5–15.5)
WBC: 11 10*3/uL — ABNORMAL HIGH (ref 4.0–10.5)

## 2012-05-29 LAB — BASIC METABOLIC PANEL
Calcium: 9.6 mg/dL (ref 8.4–10.5)
Creatinine, Ser: 1.96 mg/dL — ABNORMAL HIGH (ref 0.50–1.35)
GFR calc non Af Amer: 32 mL/min — ABNORMAL LOW (ref >90)
Glucose, Bld: 158 mg/dL — ABNORMAL HIGH (ref 70–99)
Sodium: 139 mEq/L (ref 135–145)

## 2012-05-29 LAB — IRON AND TIBC
Saturation Ratios: 9 % — ABNORMAL LOW (ref 20–55)
TIBC: 249 ug/dL (ref 215–435)

## 2012-05-29 LAB — FERRITIN: Ferritin: 131 ng/mL (ref 22–322)

## 2012-05-29 MED ORDER — INSULIN ASPART 100 UNIT/ML ~~LOC~~ SOLN
12.0000 [IU] | Freq: Three times a day (TID) | SUBCUTANEOUS | Status: DC
  Administered 2012-05-29 – 2012-05-30 (×3): 12 [IU] via SUBCUTANEOUS

## 2012-05-29 NOTE — Progress Notes (Signed)
Subjective: The patient has no complaints of chest pain or shortness of breath. He has some mild left heel pain. No subjective fever or chills.  Objective: Vital signs in last 24 hours: Filed Vitals:   05/28/12 0524 05/28/12 1417 05/28/12 2144 05/29/12 0559  BP: 178/61 158/73 157/67 155/73  Pulse: 62 71 57 59  Temp: 98.5 F (36.9 C) 98.1 F (36.7 C) 98.2 F (36.8 C) 98.1 F (36.7 C)  TempSrc:   Oral Oral  Resp: 20 20 20 20   Height:      Weight:      SpO2: 99% 97% 97% 95%    Intake/Output Summary (Last 24 hours) at 05/29/12 1203 Last data filed at 05/29/12 1103  Gross per 24 hour  Intake    750 ml  Output   2475 ml  Net  -1725 ml    Weight change:   Physical exam: General: Pleasant 77 year old Caucasian man sitting up in the recliner, in no acute distress. Lungs: Clear to auscultation bilaterally. Breathing is nonlabored. Heart: Irregular, irregular. Abdomen: Positive bowel sounds, soft, mildly obese, nontender, nondistended. Extremities: 3+ left foot edema. Left heel is currently dressed and the dressing not taken off. (From yesterday, left heel ulcerated lesion approximating 2 cm with small eschar and now surrounding induration; malodorous; no active purulent drainage currently. Left foot is mildly tender to palpation.). Multiple varicosities around both ankles/feet. Trace of pedal edema on the right. Pedal pulses barely palpable. Neurologic: He is alert and oriented x3. Cranial nerves II through XII are grossly intact.  Lab Results: Basic Metabolic Panel:  Basename 05/29/12 0612 05/28/12 0539  NA 139 139  K 3.7 3.4*  CL 99 99  CO2 26 27  GLUCOSE 158* 184*  BUN 49* 52*  CREATININE 1.96* 1.78*  CALCIUM 9.6 9.4  MG -- --  PHOS -- --   Liver Function Tests: No results found for this basename: AST:2,ALT:2,ALKPHOS:2,BILITOT:2,PROT:2,ALBUMIN:2 in the last 72 hours No results found for this basename: LIPASE:2,AMYLASE:2 in the last 72 hours No results found for this  basename: AMMONIA:2 in the last 72 hours CBC:  Basename 05/29/12 0612 05/28/12 0539 05/27/12 1845  WBC 11.0* 10.0 --  NEUTROABS -- -- 8.1*  HGB 10.9* 10.4* --  HCT 33.1* 31.6* --  MCV 89.0 89.3 --  PLT 198 194 --   Cardiac Enzymes: No results found for this basename: CKTOTAL:3,CKMB:3,CKMBINDEX:3,TROPONINI:3 in the last 72 hours BNP: No results found for this basename: PROBNP:3 in the last 72 hours D-Dimer: No results found for this basename: DDIMER:2 in the last 72 hours CBG:  Basename 05/29/12 1125 05/29/12 0748 05/28/12 2346 05/28/12 2300 05/28/12 2044 05/28/12 1711  GLUCAP 211* 156* 102* 53* 107* 130*   Hemoglobin A1C: No results found for this basename: HGBA1C in the last 72 hours Fasting Lipid Panel: No results found for this basename: CHOL,HDL,LDLCALC,TRIG,CHOLHDL,LDLDIRECT in the last 72 hours Thyroid Function Tests:  Basename 05/27/12 1653  TSH 0.737  T4TOTAL --  FREET4 --  T3FREE --  THYROIDAB --   Anemia Panel: No results found for this basename: VITAMINB12,FOLATE,FERRITIN,TIBC,IRON,RETICCTPCT in the last 72 hours Coagulation:  Basename 05/29/12 0612 05/28/12 0544  LABPROT 26.5* 29.5*  INR 2.39* 2.75*   Urine Drug Screen: Drugs of Abuse  No results found for this basename: labopia,  cocainscrnur,  labbenz,  amphetmu,  thcu,  labbarb    Alcohol Level: No results found for this basename: ETH:2 in the last 72 hours Urinalysis: No results found for this basename: COLORURINE:2,APPERANCEUR:2,LABSPEC:2,PHURINE:2,GLUCOSEU:2,HGBUR:2,BILIRUBINUR:2,KETONESUR:2,PROTEINUR:2,UROBILINOGEN:2,NITRITE:2,LEUKOCYTESUR:2 in the last  72 hours Misc. Labs:   Micro: Recent Results (from the past 240 hour(s))  WOUND CULTURE     Status: Normal (Preliminary result)   Collection Time   05/27/12  8:22 PM      Component Value Range Status Comment   Specimen Description WOUND LEFT FOOT   Final    Special Requests IMMUNE:COMPRM   Final    Gram Stain     Final    Value: NO WBC  SEEN     NO SQUAMOUS EPITHELIAL CELLS SEEN     NO ORGANISMS SEEN   Culture Culture reincubated for better growth   Final    Report Status PENDING   Incomplete     Studies/Results: US Arterial Seg Multiple  05/28/2012  *RADIOLOGY REPORT*  Clinical Data:Bilateral lower extremity claudication, left heel ulcer, diabetes, previous tobacco use.  NONINVASIVE PHYSIOLOGIC VASCULAR STUDY OF BILATERAL LOWER EXTREMITIES  Technique:  Evaluation of both lower extremities was performed at rest, including calculation of ankle-brachial indices, multiple segmental pressure evaluation, segmental Doppler and segmental pulse volume recording.  Comparison: None  Findings:  At rest, the right ABI is non-calculable due to vascular noncompressibility, left 0.61.  Segmental pressures suggest moderate iliofemoral and more significant infrapopliteal occlusive components. There are poor wave forms recorded on Doppler interrogation, with at least monophasic flow noted at every level.  Pulse volume recording is symmetric through the calf level, somewhat diminished on the left to below the calf compared to the right.  Exercise testing was not performed.  IMPRESSION:  1. Moderately severe left lower extremity arterial occlusive disease at rest. Should the patient fail conservative treatment, consider  MRA runoff ( preferred over CTA in the setting of vascular calcification and noncompressibility)  to better define the site and nature of arterial occlusive disease and delineate treatment options. 2.  Limited evaluation of right lower extremity secondary to vascular noncompressibility.  Original Report Authenticated By: Osa Craver, M.D.   Dg Foot Complete Left  05/27/2012  *RADIOLOGY REPORT*  Clinical Data: Left foot swelling.  Soft tissue ulceration on the plantar aspect of the left heel.  Cellulitis.  LEFT FOOT - COMPLETE 3+ VIEW  Comparison: Radiographs dated 09/03/2007  Findings: There is a soft tissue ulceration on the  plantar aspect the heel.  There is no underlying osteomyelitis.  The patient has destruction of the head of the first metatarsal and the base of the proximal phalangeal bone of the great toe.  There are large erosions at the interphalangeal joint of the great toe. This is consistent with extensive gout.  There is an old deformity of the distal aspect of the proximal phalangeal bone of the second toe consistent with a remote fracture, new since 09/03/2007.  There is slight degenerative arthritis in the tarsal joints and at the metatarsal phalangeal joints with erosions of the bases of the fourth and fifth metatarsals consistent with gout.  There is marked soft tissue swelling on the dorsum of the foot. Extensive arterial vascular calcification is present.  Slight degenerative changes of the distal tibia.  IMPRESSION:  1.  Soft tissue ulceration on the plantar aspect of the heel without visible underlying osteomyelitis. 2.  Bone destruction of the first metatarsal phalangeal joint secondary to gout. 3.  Multiple erosions elsewhere in the foot consistent with gout.  Original Report Authenticated By: Gwynn Burly, M.D.    Medications:  Scheduled:    . allopurinol  450 mg Oral Q breakfast  . carvedilol  12.5 mg  Oral BID WC  . collagenase   Topical Daily  . furosemide  80 mg Oral BID  . hydrALAZINE  25 mg Oral TID  . insulin aspart  12 Units Subcutaneous TID AC  . insulin glargine  80 Units Subcutaneous Q breakfast  . isosorbide dinitrate  20 mg Oral TID  . levothyroxine  50 mcg Oral Q breakfast  . metolazone  10 mg Oral QODAY  . piperacillin-tazobactam (ZOSYN)  IV  3.375 g Intravenous Q8H  . potassium chloride SA  40 mEq Oral BID  . simvastatin  40 mg Oral QHS  . sodium chloride  3 mL Intravenous Q12H  . vancomycin  1,500 mg Intravenous Q24H  . warfarin  2.5 mg Oral Custom  . warfarin  5 mg Oral Custom  . Warfarin - Physician Dosing Inpatient   Does not apply q1800  . DISCONTD: insulin aspart   20-25 Units Subcutaneous TID AC   Continuous:  ZOX:WRUEAV chloride, HYDROcodone-acetaminophen, sodium chloride  Assessment: Principal Problem:  *Diabetic foot infection Active Problems:  CAD (coronary artery disease)  ICD (implantable cardiac defibrillator) in place  Diabetes mellitus type 2, insulin dependent  CKD (chronic kidney disease) stage 4, GFR 15-29 ml/min  Anemia due to chronic illness  HTN (hypertension)  Hypokalemia  Ischemic cardiomyopathy, EF 25%  Peripheral vascular disease, unspecified  Chronic anticoagulation  Gout  Hypothyroidism    1. Diabetic foot ulcer/cellulitis. His white blood cell count is mildly elevated, but he is afebrile. The x-ray of the foot revealed no obvious osteomyelitis although radiographically, this is not the ideal study. An MRI cannot be ordered because of his ICD. We'll continue vancomycin as ordered, but given his diabetic status, it is likely that the infection is polymicrobial. Zosyn added. Apparently, a specimen was obtained in the emergency department and the culture is pending. Wound care consultation noted. Discussed patient with general surgeon Dr. Leticia Penna. He does not recommend surgical debridement at this time. He did order ABIs. The patient has moderate to severe left lower extremity arterial occlusive disease at rest and ABI is 0.61. Right lower extremity ABI not calculated due to to vascular noncompressibility. He has known peripheral vascular disease. No need for venous Doppler as the patient has chronic left lower extremity edema from vein harvesting for his CABG. CT of his foot ordered but the CT scanner is not functioning.  Radiographic findings of bony destruction of the left first metatarsal and multiple erosions in the foot, consistent with gout. We'll continue allopurinol.  Type 2 diabetes mellitus. We'll continue Lantus and NovoLog as scheduled. Make adjustments accordingly.  Ischemic cardiomyopathy/ICD  placement/CAD/paroxysmal atrial fibrillation. Currently stable on chronic medications, including Coumadin.  Also history of peripheral vascular disease.  Hypertension. His blood pressure is slightly elevated this morning. He is on excellent antihypertensive medications. We'll continue to monitor for adjustments in dosing.  Stage IV chronic kidney disease. His creatinine is actually better than baseline. It was 2.7 in December 2012.  Anemia of chronic disease. Anemia panel pending.  Chronic hypokalemia, in part secondary to Lasix. Continue repletion and daily monitoring of serum potassium.    Plan: 1. Will try to discuss the patient's peripheral vascular disease with first his cardiologist and then likely obtain a referral to vascular surgery in [place]. For now, continue wound care, supportive treatment, and IV antibiotics. 2. The physical therapist has evaluated the patient. Appreciate recommendations. We'll order a surgical shoe. 3. Consider discharge tomorrow if stable.     LOS: 2 days  Mackinley Kiehn 05/29/2012, 12:03 PM

## 2012-05-29 NOTE — Progress Notes (Signed)
CBG:53  Treatment: 15 GM carbohydrate snack  Symptoms: pt stated low blood sugar  Follow-up CBG: Time: CBG Result:102  Possible Reasons for Event: Medication regimen:   Comments/MD notified:Patient stated he "felt better"    Blima Rich

## 2012-05-29 NOTE — Evaluation (Signed)
Physical Therapy Evaluation Patient Details Name: [name] MRN: [id number] DOB: 07/15/1935 Today's Date: 05/29/2012 Time: 1010-1041 PT Time Calculation (min): 31 min  PT Assessment / Plan / Recommendation Clinical Impression  Pt is seen for eval.He if functionally independent with bed mobility and transfers.  He was instructed in elevation of L foot as well as gait using a walker, weight on forefoot only.  I offered to obtain a post op shoe for his L foot because of edema, but he declines.  Perhaps the MD can talk him into it as I don't think he has a shoe or slipper that would fit his foot, especially with the bandaging present.  The post op shoe can be obtained from [employer]..he would probably need an extra large size.    PT Assessment  Patent does not need any further PT services    Follow Up Recommendations  No PT follow up    Barriers to Discharge        Equipment Recommendations  None recommended by PT    Recommendations for Other Services     Frequency      Precautions / Restrictions Precautions Precautions: None Restrictions Weight Bearing Restrictions: Yes LLE Weight Bearing:  (weight bearing on forefoot only)   Pertinent Vitals/Pain       Mobility  Bed Mobility Bed Mobility: Supine to Sit;Sit to Supine Supine to Sit: 7: Independent Sit to Supine: 7: Independent Transfers Transfers: Sit to Stand;Stand to Sit Sit to Stand: 6: Modified independent (Device/Increase time) Stand to Sit: 6: Modified independent (Device/Increase time) Ambulation/Gait Ambulation/Gait Assistance: 6: Modified independent (Device/Increase time) Ambulation Distance (Feet): 60 Feet Assistive device: Rolling walker Ambulation/Gait Assistance Details: pt instructed in weight bearing on L forefoot, none on heel, using a walker Gait Pattern: Step-through pattern;Decreased stance time - left Stairs: No Wheelchair Mobility Wheelchair Mobility: No    Exercises     PT Diagnosis:      PT Problem List:   PT Treatment Interventions:     PT Goals    Visit Information  Last PT Received On: 05/29/12    Subjective Data  Subjective: no c/o Patient Stated Goal: return home   Prior Functioning  Home Living Lives With: Alone Available Help at Discharge: Family Type of Home: House Home Access: Stairs to enter Secretary/administrator of Steps: 8 Entrance Stairs-Rails: Right;Left Home Layout: One level Bathroom Shower/Tub: Engineer, manufacturing systems: Handicapped height Home Adaptive Equipment: Straight cane;Walker - rolling Prior Function Level of Independence: Independent Able to Take Stairs?: Yes Driving: Yes Vocation: Retired Musician: No difficulties    Cognition  Overall Cognitive Status: Appears within functional limits for tasks assessed/performed Arousal/Alertness: Awake/alert Orientation Level: Appears intact for tasks assessed Behavior During Session: [facility] for tasks performed    Extremity/Trunk Assessment Right Upper Extremity Assessment RUE ROM/Strength/Tone: Within functional levels RUE Sensation: WFL - Light Touch;WFL - Proprioception RUE Coordination: WFL - gross motor Left Upper Extremity Assessment LUE ROM/Strength/Tone: Within functional levels LUE Sensation: WFL - Light Touch;WFL - Proprioception LUE Coordination: WFL - gross motor Right Lower Extremity Assessment RLE ROM/Strength/Tone: Within functional levels RLE Sensation: History of peripheral neuropathy RLE Coordination: WFL - gross motor Left Lower Extremity Assessment LLE ROM/Strength/Tone: WFL for tasks assessed LLE Sensation: History of peripheral neuropathy LLE Coordination: WFL - gross motor Trunk Assessment Trunk Assessment: Normal   Balance Balance Balance Assessed: No  End of Session PT - End of Session Equipment Utilized During Treatment: Gait belt Activity Tolerance: Patient tolerated treatment well Patient  left: in bed;with call bell/phone  within reach;with bed alarm set (LLE elevated on 4 pillows)  GP     Myrlene Broker L 05/29/2012, 10:51 AM

## 2012-05-30 DIAGNOSIS — N183 Chronic kidney disease, stage 3 (moderate): Secondary | ICD-10-CM

## 2012-05-30 DIAGNOSIS — M869 Osteomyelitis, unspecified: Secondary | ICD-10-CM

## 2012-05-30 DIAGNOSIS — L97509 Non-pressure chronic ulcer of other part of unspecified foot with unspecified severity: Secondary | ICD-10-CM

## 2012-05-30 DIAGNOSIS — I798 Other disorders of arteries, arterioles and capillaries in diseases classified elsewhere: Secondary | ICD-10-CM

## 2012-05-30 LAB — BASIC METABOLIC PANEL
BUN: 50 mg/dL — ABNORMAL HIGH (ref 6–23)
CO2: 27 mEq/L (ref 19–32)
Chloride: 99 mEq/L (ref 96–112)
Glucose, Bld: 86 mg/dL (ref 70–99)
Potassium: 3.6 mEq/L (ref 3.5–5.1)
Sodium: 140 mEq/L (ref 135–145)

## 2012-05-30 LAB — WOUND CULTURE

## 2012-05-30 LAB — CBC
HCT: 33.4 % — ABNORMAL LOW (ref 39.0–52.0)
Hemoglobin: 10.8 g/dL — ABNORMAL LOW (ref 13.0–17.0)
MCH: 29 pg (ref 26.0–34.0)
MCHC: 32.3 g/dL (ref 30.0–36.0)
MCV: 89.5 fL (ref 78.0–100.0)
RBC: 3.73 MIL/uL — ABNORMAL LOW (ref 4.22–5.81)

## 2012-05-30 LAB — HEMOGLOBIN A1C
Hgb A1c MFr Bld: 8.2 % — ABNORMAL HIGH (ref <5.7)
Mean Plasma Glucose: 189 mg/dL — ABNORMAL HIGH (ref <117)

## 2012-05-30 MED ORDER — SODIUM CHLORIDE 0.9 % IJ SOLN
INTRAMUSCULAR | Status: AC
  Administered 2012-05-30: 3 mL
  Filled 2012-05-30: qty 3

## 2012-05-30 MED ORDER — HYDROCODONE-ACETAMINOPHEN 5-325 MG PO TABS: 1.0000 | ORAL_TABLET | Freq: Four times a day (QID) | ORAL | Status: AC | PRN

## 2012-05-30 MED ORDER — INSULIN GLARGINE 100 UNIT/ML ~~LOC~~ SOLN: 50.0000 [IU] | Freq: Two times a day (BID) | SUBCUTANEOUS | Status: DC

## 2012-05-30 MED ORDER — COLLAGENASE 250 UNIT/GM EX OINT: TOPICAL_OINTMENT | Freq: Every day | CUTANEOUS | Status: AC

## 2012-05-30 MED ORDER — POTASSIUM CHLORIDE IN NACL 20-0.45 MEQ/L-% IV SOLN
INTRAVENOUS | Status: AC
  Administered 2012-05-30: 13:00:00 via INTRAVENOUS
  Filled 2012-05-30: qty 1000

## 2012-05-30 MED ORDER — AMOXICILLIN-POT CLAVULANATE 500-125 MG PO TABS: 1.0000 | ORAL_TABLET | Freq: Two times a day (BID) | ORAL | Status: AC

## 2012-05-30 NOTE — Discharge Summary (Addendum)
Physician Discharge Summary  [name] MRN: [id number] DOB/AGE: 75-Dec-1936 75 y.o.  PCP: Colette Ribas, MD   Admit date: 05/27/2012 Discharge date: 05/30/2012  Discharge Diagnoses:  1. Left diabetic foot ulcer with infection. Consider referral to wound care clinic, following vascular evaluation by Dr. Alanda Amass. 2. Chronic peripheral vascular disease. ABI on the left was 0.61 and ABI on the right could not be calculated due to vascular noncompressibility. 3. Chronic left lower extremity edema. 4. 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 5. Stage III chronic kidney disease. Creatinine 2.23 at the time of discharge. FOLLOWUP BASIC METABOLIC PANEL RECOMMENDED AT HOSPITAL FOLLOWUP APPOINTMENT TO REASSESS CREATININE. 6. Anemia secondary mild iron deficiency and to chronic disease. Hemoglobin 10.8 prior to discharge. Anemia panel revealed a total iron of 23, percent saturation of 9, ferritin 131, folate of 12.9, and vitamin B12 476. 7. Chronic atrial fibrillation, on Coumadin therapy. INR was 2.63 prior to discharge. 8. Ischemic cardiomyopathy/ICD in place/CAD. Compensated and stable. 9. Hypertension. 10.Uncontrolled Type 2 diabetes mellitus with peripheral vascular diease. Hemoglobin A1c was 8.2. 11. Hypokalemia, repleted. 12. Hypothyroidism. TSH within normal limits at 0.73.    Medication List  As of 05/30/2012 12:15 PM   TAKE these medications         allopurinol 300 MG tablet   Commonly known as: ZYLOPRIM   Take 450 mg by mouth every morning.      amoxicillin-clavulanate 500-125 MG per tablet   Commonly known as: AUGMENTIN   Take 1 tablet (500 mg total) by mouth 2 (two) times daily. Antibiotic to take for 10 more days.      carvedilol 25 MG tablet   Commonly known as: COREG   Take 12.5 mg by mouth 2 (two) times daily.      collagenase ointment   Commonly known as: SANTYL   Apply topically daily.      furosemide 80 MG tablet   Commonly known as: LASIX   Take 80 mg by mouth 2 (two) times daily.      hydrALAZINE 25 MG tablet   Commonly known as: APRESOLINE   Take 25 mg by mouth 3 (three) times daily.      HYDROcodone-acetaminophen 5-325 MG per tablet   Commonly known as: NORCO   Take 1-2 tablets by mouth every 6 (six) hours as needed.      insulin aspart 100 UNIT/ML injection   Commonly known as: novoLOG   Inject 10-15 Units into the skin 3 (three) times daily before meals.      insulin glargine 100 UNIT/ML injection   Commonly known as: LANTUS   Inject 50 Units into the skin 2 (two) times daily.      isosorbide dinitrate 20 MG tablet   Commonly known as: ISORDIL   Take 20 mg by mouth 3 (three) times daily.      levothyroxine 50 MCG tablet   Commonly known as: SYNTHROID, LEVOTHROID   Take 50 mcg by mouth every morning.      metolazone 2.5 MG tablet   Commonly known as: ZAROXOLYN   Take 2.5 mg by mouth daily. States he takes with his furosemide every day except on Tuesday and Friday      omeprazole 20 MG capsule   Commonly known as: PRILOSEC   Take 40 mg by mouth every morning.      potassium chloride SA 20 MEQ tablet   Commonly known as: K-DUR,KLOR-CON   Take 2 tablets (  40 mEq total) by mouth 2 (two) times daily.      simvastatin 40 MG tablet   Commonly known as: ZOCOR   Take 40 mg by mouth at bedtime.      warfarin 5 MG tablet   Commonly known as: COUMADIN   Take 2.5-5 mg by mouth every evening. Takes one tablet (5mg ) every day except on Tuesdays and Fridays, take one-half tablet (2.5mg )            Discharge Condition: Improved.  Disposition: 01-Home or Self Care   Consults: Tilford Pillar, M.D.   Significant Diagnostic Studies: Ct Foot Left Wo Contrast  05/29/2012  *RADIOLOGY REPORT*  Clinical Data:  Left foot swelling, foul-smelling, purulent discharge at heel, question deep infection, history chronic renal disease and diabetes  CT OF THE LEFT FOOT WITHOUT CONTRAST  Technique:   Multidetector CT imaging was performed according to the standard protocol.  Multiplanar CT image reconstructions were also generated.  IV contrast could not be administered due to renal dysfunction.  The patient is unable to have an MRI due to presence of an AICD.  Comparison: Right foot radiographs 05/27/2012  Findings: Osseous demineralization. Erosions are present at the first MTP joint, subtalar joints, posterior talus, IP joint great toe and at multiple TMT and intertarsal joints consistent with gout. At the posterior talus, first MTP joint, and IP joint great toe, less at the subtalar joints, calcified tophi are present. Tophaceous gout also seen within the soft tissues at the plantar aspect of the right heel, measuring 4.9 x 3.9 x 2.4 cm. Bones appear demineralized. Diffuse soft tissue swelling, notably at dorsal margin of the midfoot. No definite discrete focal fluid collection is seen to suggest an abscess. Extensive scattered joint space narrowing. Scattered small vessel vascular calcifications. No definite areas of acute bone destruction are identified worrisome for osteomyelitis. Ulcer identified at lateral aspect of the plantar heel, series 6 images 56-58. Question thickening of the posterior tibial tendon distal to the medial malleolus.  IMPRESSION: Extensive changes of tophaceous gout as above. Extensive soft tissue swelling without definite discrete abscess. No definite areas of acute bone destruction are identified to suggest osteomyelitis.  Original Report Authenticated By: Lollie Marrow, M.D.   US Arterial Seg Multiple  05/28/2012  *RADIOLOGY REPORT*  Clinical Data:Bilateral lower extremity claudication, left heel ulcer, diabetes, previous tobacco use.  NONINVASIVE PHYSIOLOGIC VASCULAR STUDY OF BILATERAL LOWER EXTREMITIES  Technique:  Evaluation of both lower extremities was performed at rest, including calculation of ankle-brachial indices, multiple segmental pressure evaluation, segmental  Doppler and segmental pulse volume recording.  Comparison: None  Findings:  At rest, the right ABI is non-calculable due to vascular noncompressibility, left 0.61.  Segmental pressures suggest moderate iliofemoral and more significant infrapopliteal occlusive components. There are poor wave forms recorded on Doppler interrogation, with at least monophasic flow noted at every level.  Pulse volume recording is symmetric through the calf level, somewhat diminished on the left to below the calf compared to the right.  Exercise testing was not performed.  IMPRESSION:  1. Moderately severe left lower extremity arterial occlusive disease at rest. Should the patient fail conservative treatment, consider  MRA runoff ( preferred over CTA in the setting of vascular calcification and noncompressibility)  to better define the site and nature of arterial occlusive disease and delineate treatment options. 2.  Limited evaluation of right lower extremity secondary to vascular noncompressibility.  Original Report Authenticated By: Osa Craver, M.D.   Dg Foot Complete  Left  05/27/2012  *RADIOLOGY REPORT*  Clinical Data: Left foot swelling.  Soft tissue ulceration on the plantar aspect of the left heel.  Cellulitis.  LEFT FOOT - COMPLETE 3+ VIEW  Comparison: Radiographs dated 09/03/2007  Findings: There is a soft tissue ulceration on the plantar aspect the heel.  There is no underlying osteomyelitis.  The patient has destruction of the head of the first metatarsal and the base of the proximal phalangeal bone of the great toe.  There are large erosions at the interphalangeal joint of the great toe. This is consistent with extensive gout.  There is an old deformity of the distal aspect of the proximal phalangeal bone of the second toe consistent with a remote fracture, new since 09/03/2007.  There is slight degenerative arthritis in the tarsal joints and at the metatarsal phalangeal joints with erosions of the bases of the  fourth and fifth metatarsals consistent with gout.  There is marked soft tissue swelling on the dorsum of the foot. Extensive arterial vascular calcification is present.  Slight degenerative changes of the distal tibia.  IMPRESSION:  1.  Soft tissue ulceration on the plantar aspect of the heel without visible underlying osteomyelitis. 2.  Bone destruction of the first metatarsal phalangeal joint secondary to gout. 3.  Multiple erosions elsewhere in the foot consistent with gout.  Original Report Authenticated By: Gwynn Burly, M.D.     Microbiology: Recent Results (from the past 240 hour(s))  WOUND CULTURE     Status: Normal   Collection Time   05/27/12  8:22 PM      Component Value Range Status Comment   Specimen Description WOUND LEFT FOOT   Final    Special Requests IMMUNE:COMPRM   Final    Gram Stain     Final    Value: NO WBC SEEN     NO SQUAMOUS EPITHELIAL CELLS SEEN     NO ORGANISMS SEEN   Culture     Final    Value: MULTIPLE ORGANISMS PRESENT, NONE PREDOMINANT     Note: NO STAPHYLOCOCCUS AUREUS ISOLATED NO GROUP A STREP (S.PYOGENES) ISOLATED   Report Status 05/30/2012 FINAL   Final      Labs: Results for orders placed during the hospital encounter of 05/27/12 (from the past 48 hour(s))  GLUCOSE, CAPILLARY     Status: Abnormal   Collection Time   05/28/12  5:11 PM      Component Value Range Comment   Glucose-Capillary 130 (*) 70 - 99 mg/dL    Comment 1 Notify RN      Comment 2 Documented in Chart     GLUCOSE, CAPILLARY     Status: Abnormal   Collection Time   05/28/12  8:44 PM      Component Value Range Comment   Glucose-Capillary 107 (*) 70 - 99 mg/dL    Comment 1 Notify RN      Comment 2 Documented in Chart     GLUCOSE, CAPILLARY     Status: Abnormal   Collection Time   05/28/12 11:00 PM      Component Value Range Comment   Glucose-Capillary 53 (*) 70 - 99 mg/dL    Comment 1 Notify RN      Comment 2 Documented in Chart     GLUCOSE, CAPILLARY     Status: Abnormal     Collection Time   05/28/12 11:46 PM      Component Value Range Comment   Glucose-Capillary 102 (*) 70 - 99  mg/dL   PROTIME-INR     Status: Abnormal   Collection Time   05/29/12  6:12 AM      Component Value Range Comment   Prothrombin Time 26.5 (*) 11.6 - 15.2 seconds    INR 2.39 (*) 0.00 - 1.49   VITAMIN B12     Status: Normal   Collection Time   05/29/12  6:12 AM      Component Value Range Comment   Vitamin B-12 476  211 - 911 pg/mL   FOLATE     Status: Normal   Collection Time   05/29/12  6:12 AM      Component Value Range Comment   Folate 12.9     IRON AND TIBC     Status: Abnormal   Collection Time   05/29/12  6:12 AM      Component Value Range Comment   Iron 23 (*) 42 - 135 ug/dL    TIBC 045  409 - 811 ug/dL    Saturation Ratios 9 (*) 20 - 55 %    UIBC 226  125 - 400 ug/dL   FERRITIN     Status: Normal   Collection Time   05/29/12  6:12 AM      Component Value Range Comment   Ferritin 131  22 - 322 ng/mL   BASIC METABOLIC PANEL     Status: Abnormal   Collection Time   05/29/12  6:12 AM      Component Value Range Comment   Sodium 139  135 - 145 mEq/L    Potassium 3.7  3.5 - 5.1 mEq/L    Chloride 99  96 - 112 mEq/L    CO2 26  19 - 32 mEq/L    Glucose, Bld 158 (*) 70 - 99 mg/dL    BUN 49 (*) 6 - 23 mg/dL    Creatinine, Ser 9.14 (*) 0.50 - 1.35 mg/dL    Calcium 9.6  8.4 - 78.2 mg/dL    GFR calc non Af Amer 32 (*) >90 mL/min    GFR calc Af Amer 37 (*) >90 mL/min   CBC     Status: Abnormal   Collection Time   05/29/12  6:12 AM      Component Value Range Comment   WBC 11.0 (*) 4.0 - 10.5 K/uL    RBC 3.72 (*) 4.22 - 5.81 MIL/uL    Hemoglobin 10.9 (*) 13.0 - 17.0 g/dL    HCT 95.6 (*) 21.3 - 52.0 %    MCV 89.0  78.0 - 100.0 fL    MCH 29.3  26.0 - 34.0 pg    MCHC 32.9  30.0 - 36.0 g/dL    RDW 08.6  57.8 - 46.9 %    Platelets 198  150 - 400 K/uL   GLUCOSE, CAPILLARY     Status: Abnormal   Collection Time   05/29/12  7:48 AM      Component Value Range Comment    Glucose-Capillary 156 (*) 70 - 99 mg/dL    Comment 1 Documented in Chart      Comment 2 Notify RN     GLUCOSE, CAPILLARY     Status: Abnormal   Collection Time   05/29/12 11:25 AM      Component Value Range Comment   Glucose-Capillary 211 (*) 70 - 99 mg/dL    Comment 1 Documented in Chart      Comment 2 Notify RN     HEMOGLOBIN A1C  Status: Abnormal   Collection Time   05/29/12 12:15 PM      Component Value Range Comment   Hemoglobin A1C 8.2 (*) <5.7 %    Mean Plasma Glucose 189 (*) <117 mg/dL   GLUCOSE, CAPILLARY     Status: Abnormal   Collection Time   05/29/12  4:32 PM      Component Value Range Comment   Glucose-Capillary 131 (*) 70 - 99 mg/dL    Comment 1 Documented in Chart      Comment 2 Notify RN     GLUCOSE, CAPILLARY     Status: Abnormal   Collection Time   05/29/12  8:33 PM      Component Value Range Comment   Glucose-Capillary 111 (*) 70 - 99 mg/dL   PROTIME-INR     Status: Abnormal   Collection Time   05/30/12  5:56 AM      Component Value Range Comment   Prothrombin Time 28.5 (*) 11.6 - 15.2 seconds    INR 2.63 (*) 0.00 - 1.49   BASIC METABOLIC PANEL     Status: Abnormal   Collection Time   05/30/12  5:56 AM      Component Value Range Comment   Sodium 140  135 - 145 mEq/L    Potassium 3.6  3.5 - 5.1 mEq/L    Chloride 99  96 - 112 mEq/L    CO2 27  19 - 32 mEq/L    Glucose, Bld 86  70 - 99 mg/dL    BUN 50 (*) 6 - 23 mg/dL    Creatinine, Ser 9.60 (*) 0.50 - 1.35 mg/dL    Calcium 9.7  8.4 - 45.4 mg/dL    GFR calc non Af Amer 27 (*) >90 mL/min    GFR calc Af Amer 32 (*) >90 mL/min   CBC     Status: Abnormal   Collection Time   05/30/12  5:56 AM      Component Value Range Comment   WBC 10.5  4.0 - 10.5 K/uL    RBC 3.73 (*) 4.22 - 5.81 MIL/uL    Hemoglobin 10.8 (*) 13.0 - 17.0 g/dL    HCT 09.8 (*) 11.9 - 52.0 %    MCV 89.5  78.0 - 100.0 fL    MCH 29.0  26.0 - 34.0 pg    MCHC 32.3  30.0 - 36.0 g/dL    RDW 14.7  82.9 - 56.2 %    Platelets 195  150 - 400  K/uL   GLUCOSE, CAPILLARY     Status: Normal   Collection Time   05/30/12  7:57 AM      Component Value Range Comment   Glucose-Capillary 94  70 - 99 mg/dL   GLUCOSE, CAPILLARY     Status: Abnormal   Collection Time   05/30/12 11:36 AM      Component Value Range Comment   Glucose-Capillary 260 (*) 70 - 99 mg/dL      HPI : The patient is a 77 year old man with a history significant for ischemic cardiomyopathy, ICD placement, chronic atrial fibrillation, peripheral vascular disease, and diabetes mellitus, who presented to the emergency department on 05/27/2012 with a chief complaint of a foul-smelling left heel wound. In the emergency department, he was afebrile and hemodynamically stable. His lab data were significant for a serum potassium of 3.0, glucose of 64, BUN of 54, creatinine of 1.94, WBC of 10.8, and hemoglobin 11.0. X-ray of his left foot revealed marked soft  tissue swelling on the dorsum of the foot, extensive arteriovascular calcification, soft tissue ulceration on the plantar surface of the heel, no visual underlying osteomyelitis, and bony destruction of the first metatarsal phalangeal joint secondary to gout and multiple erosions elsewhere in the foot consistent with gout. He was admitted for further evaluation and management.  HOSPITAL COURSE: The heel wound was apparently draining with with malodorous drainage. A specimen was sent for culturing. He was started on vancomycin and Zosyn empirically. Most of not all of his chronic pain medications were continued. His pain was treated with as needed hydrocodone. General surgeon Dr. Leticia Penna was consulted. He recommended ABI/arterial ultrasound of both legs. It revealed an ABI of 0.61 on the left, but the ABI could not be calculated on the right lower extremity due to to noncompressibility of the vessels. He has a known history of peripheral vascular disease, and per his history, he has had stent placement in both legs in the past. He had never  had a lower extremity wound before now. Dr. Dian Situ did not believe that the patient needed surgical intervention. He recommended that the patient followup with his vascular surgeon. The wound care facilitated her was consulted. She noted that the wound was 2 x 2 by 0.3 cm; soft eschar, no surrounding erythema, and a strong order. She recommended applying Santyl ointment to chemically debride the nonviable tissue. She recommended followup as an outpatient with orthopedics or podiatry to assist with debridement of the nonviable tissue as it softens. A CT scan of his foot was ordered to rule out a deeper infection. It revealed no signs of osteomyelitis.  The patient remained hemodynamically stable. His capillary blood glucose decreased significantly on his home dosing of Lantus and NovoLog. Therefore, the dosing had to be decreased to avoid symptomatic hypoglycemia. It is likely that the patient is not totally compliant with therapy at home. His hemoglobin A1c was 8.2. Also, his creatinine improved slightly to 1.7 but gradually increased to 2.23 at the time of discharge. His baseline creatinine ranges from 1.8-2.5. His creatinine or basic metabolic panel will need to BE reassessed at the hospital followup appointment with his cardiologist or primary care physician. He was also noted to be anemic which he had been in the past. The results of the anemia panel were dictated above. He had no complaints of chest pain. There were no signs or symptoms consistent with decompensated heart failure. His rate remained controlled. His INR remained therapeutic throughout the hospitalization.  The patient's white blood cell count did increase to 11 but improved to 10.5 prior to discharge. He remained afebrile. The wound culture grew out group A streptococcus. He received IV vancomycin and Zosyn for 2-1/2 days. He was discharged on 10 more days of Augmentin.  I discussed the patient's clinical findings with cardiologist Dr.  Alanda Amass who does vascular interventions. Rather than referring him to a vascular surgeon, Dr. Alanda Amass was like to see him in his office in 3 days. I mentioned that the patient may benefit from treatment at a wound care clinic, however, I would defer the referral to him after he evaluates the patient's heel wound. The patient was informed of this and he voiced understanding.   Discharge Exam: Blood pressure 152/88, pulse 62, temperature 98.4 F (36.9 C), temperature source Oral, resp. rate 20, height 6\' 2"  (1.88 m), weight 107.956 kg (238 lb), SpO2 98.00%.  Lungs: Clear to auscultation bilaterally. Heart: Irregular, irregular, with a soft systolic murmur. Next line abdomen: Positive bowel  sounds, soft, nontender, nondistended. Extremities: 1-2+ left foot edema with multiple varicosities. Approximate 2 cm necrotic wound on the left heel with no surrounding erythema or purulent drainage.   Discharge Orders    Future Orders Please Complete By Expires   Diet - low sodium heart healthy      Increase activity slowly      Discharge instructions      Comments:   You'll need to have your kidney function tests checked at your followup visit with your cardiologist and your primary care doctor. Keep pressure off of your left heel as much as possible. Keep your left heel floated with pillows when you are sitting or lying down.   Discharge wound care:      Comments:   Apply Santyl ointment to left heel wound, cover with 4 x 4 and wrap with Curlex, daily.      Follow-up Information    Follow up with Governor Rooks, MD on 06/02/2012. (AT 11:30 AM for follow up of your leg blood vessels and your foot ulcer.)    Contact information:   [address]  [place] [address] [phone]       Follow up with Colette Ribas, MD. Schedule an appointment as soon as possible for a visit in 1 week.   Contact information:   [address] [place] [address] [place] [address] [phone]         Total discharge time: Greater than 35 minutes.   Signed: Amanee Iacovelli 05/30/2012, 12:15 PM

## 2012-05-30 NOTE — Progress Notes (Signed)
Inpatient Diabetes Program Recommendations  AACE/ADA: New Consensus Statement on Inpatient Glycemic Control  Target Ranges:  Prepandial:   less than 140 mg/dL      Peak postprandial:   less than 180 mg/dL (1-2 hours)      Critically ill patients:  140 - 180 mg/dL  Pager:  [phone] Hours:  8 am-10pm   Reason for Visit: Fasting glucose within target range with home dose Lantus 80 units daily  Inpatient Diabetes Program Recommendations Insulin - Basal: Please resume home Lantus 80 units daily  Note: Agree with decrease in meal coverage based on hypoglycemia on 6/26   Alfredia Client PhD, RN Diabetes Coordinator  Office:  [phone] Team Pager:  [phone]

## 2012-05-30 NOTE — Plan of Care (Signed)
Problem: Discharge Progression Outcomes Goal: Complications resolved/controlled Outcome: Adequate for Discharge Pt will have dressings done to foot by home health nurse Goal: Other Discharge Outcomes/Goals Outcome: Completed/Met Date Met:  05/30/12 Discharge instructions read to pt and his family all verbalized understanding  Pt is aware of appointments and was encouraged to keep appointments Discharged to home with his brother

## 2012-05-30 NOTE — Progress Notes (Signed)
Subjective: No acute change.  Objective: Vital signs in last 24 hours: Temp:  [98.1 F (36.7 C)-99 F (37.2 C)] 99 F (37.2 C) (06/27 2036) Pulse Rate:  [59-62] 62  (06/27 2036) Resp:  [20] 20  (06/27 2036) BP: (129-169)/(56-73) 169/56 mmHg (06/27 2036) SpO2:  [94 %-96 %] 94 % (06/27 2036) Last BM Date: 05/28/12  Intake/Output from previous day: 06/27 0701 - 06/28 0700 In: 1540 [P.O.:720; I.V.:120; IV Piggyback:700] Out: 2075 [Urine:2075] Intake/Output this shift: Total I/O In: -  Out: 525 [Urine:525]  Skin: L heel ulcer.  Scant drainage.  No crepitence.  Lab Results:   [facility] 05/29/12 0612 05/28/12 0539  WBC 11.0* 10.0  HGB 10.9* 10.4*  HCT 33.1* 31.6*  PLT 198 194   BMET  Basename 05/29/12 0612 05/28/12 0539  NA 139 139  K 3.7 3.4*  CL 99 99  CO2 26 27  GLUCOSE 158* 184*  BUN 49* 52*  CREATININE 1.96* 1.78*  CALCIUM 9.6 9.4   PT/INR  Basename 05/29/12 0612 05/28/12 0544  LABPROT 26.5* 29.5*  INR 2.39* 2.75*   ABG No results found for this basename: PHART:2,PCO2:2,PO2:2,HCO3:2 in the last 72 hours  Studies/Results: Ct Foot Left Wo Contrast  05/29/2012  *RADIOLOGY REPORT*  Clinical Data:  Left foot swelling, foul-smelling, purulent discharge at heel, question deep infection, history chronic renal disease and diabetes  CT OF THE LEFT FOOT WITHOUT CONTRAST  Technique:  Multidetector CT imaging was performed according to the standard protocol.  Multiplanar CT image reconstructions were also generated.  IV contrast could not be administered due to renal dysfunction.  The patient is unable to have an MRI due to presence of an AICD.  Comparison: Right foot radiographs 05/27/2012  Findings: Osseous demineralization. Erosions are present at the first MTP joint, subtalar joints, posterior talus, IP joint great toe and at multiple TMT and intertarsal joints consistent with gout. At the posterior talus, first MTP joint, and IP joint great toe, less at the subtalar  joints, calcified tophi are present. Tophaceous gout also seen within the soft tissues at the plantar aspect of the right heel, measuring 4.9 x 3.9 x 2.4 cm. Bones appear demineralized. Diffuse soft tissue swelling, notably at dorsal margin of the midfoot. No definite discrete focal fluid collection is seen to suggest an abscess. Extensive scattered joint space narrowing. Scattered small vessel vascular calcifications. No definite areas of acute bone destruction are identified worrisome for osteomyelitis. Ulcer identified at lateral aspect of the plantar heel, series 6 images 56-58. Question thickening of the posterior tibial tendon distal to the medial malleolus.  IMPRESSION: Extensive changes of tophaceous gout as above. Extensive soft tissue swelling without definite discrete abscess. No definite areas of acute bone destruction are identified to suggest osteomyelitis.  Original Report Authenticated By: Lollie Marrow, M.D.   US Arterial Seg Multiple  05/28/2012  *RADIOLOGY REPORT*  Clinical Data:Bilateral lower extremity claudication, left heel ulcer, diabetes, previous tobacco use.  NONINVASIVE PHYSIOLOGIC VASCULAR STUDY OF BILATERAL LOWER EXTREMITIES  Technique:  Evaluation of both lower extremities was performed at rest, including calculation of ankle-brachial indices, multiple segmental pressure evaluation, segmental Doppler and segmental pulse volume recording.  Comparison: None  Findings:  At rest, the right ABI is non-calculable due to vascular noncompressibility, left 0.61.  Segmental pressures suggest moderate iliofemoral and more significant infrapopliteal occlusive components. There are poor wave forms recorded on Doppler interrogation, with at least monophasic flow noted at every level.  Pulse volume recording is symmetric through the calf  level, somewhat diminished on the left to below the calf compared to the right.  Exercise testing was not performed.  IMPRESSION:  1. Moderately severe left lower  extremity arterial occlusive disease at rest. Should the patient fail conservative treatment, consider  MRA runoff ( preferred over CTA in the setting of vascular calcification and noncompressibility)  to better define the site and nature of arterial occlusive disease and delineate treatment options. 2.  Limited evaluation of right lower extremity secondary to vascular noncompressibility.  Original Report Authenticated By: Osa Craver, M.D.    Anti-infectives: Anti-infectives     Start     Dose/Rate Route Frequency Ordered Stop   05/28/12 1900   vancomycin (VANCOCIN) 1,500 mg in sodium chloride 0.9 % 500 mL IVPB        1,500 mg 250 mL/hr over 120 Minutes Intravenous Every 24 hours 05/27/12 2242     05/28/12 1200   piperacillin-tazobactam (ZOSYN) IVPB 3.375 g        3.375 g 12.5 mL/hr over 240 Minutes Intravenous Every 8 hours 05/28/12 1101     05/27/12 2300   vancomycin (VANCOCIN) 500 mg in sodium chloride 0.9 % 100 mL IVPB        500 mg 100 mL/hr over 60 Minutes Intravenous  Once 05/27/12 2241 05/28/12 0053   05/27/12 1830   vancomycin (VANCOCIN) IVPB 1000 mg/200 mL premix        1,000 mg 200 mL/hr over 60 Minutes Intravenous  Once 05/27/12 1826 05/27/12 1943          Assessment/Plan: s/p * No surgery found * Ulcer is likely related to both diabetes as well as arterial insufficency.  Continue local care.  Patient would likely benefit from referral back to his vascular surgeon.  LOS: 3 days    Jennette Leask C 05/30/2012

## 2012-06-02 ENCOUNTER — Encounter (HOSPITAL_COMMUNITY): Payer: Self-pay | Admitting: Pharmacy Technician

## 2012-06-03 ENCOUNTER — Other Ambulatory Visit: Payer: Self-pay | Admitting: Cardiovascular Disease

## 2012-06-11 ENCOUNTER — Encounter (HOSPITAL_COMMUNITY): Payer: Self-pay | Admitting: Cardiology

## 2012-06-11 ENCOUNTER — Inpatient Hospital Stay (HOSPITAL_COMMUNITY)
Admission: AD | Admit: 2012-06-11 | Discharge: 2012-06-18 | DRG: 253 | Disposition: A | Discharge status: Home / Self Care | Payer: Medicare Other | Source: Ambulatory Visit | Attending: Cardiovascular Disease | Admitting: Cardiovascular Disease

## 2012-06-11 ENCOUNTER — Inpatient Hospital Stay (HOSPITAL_COMMUNITY): Payer: Medicare Other

## 2012-06-11 DIAGNOSIS — Z87891 Personal history of nicotine dependence: Secondary | ICD-10-CM

## 2012-06-11 DIAGNOSIS — I472 Ventricular tachycardia: Secondary | ICD-10-CM | POA: Diagnosis present

## 2012-06-11 DIAGNOSIS — M069 Rheumatoid arthritis, unspecified: Secondary | ICD-10-CM | POA: Diagnosis present

## 2012-06-11 DIAGNOSIS — I4729 Other ventricular tachycardia: Secondary | ICD-10-CM | POA: Diagnosis present

## 2012-06-11 DIAGNOSIS — G4730 Sleep apnea, unspecified: Secondary | ICD-10-CM | POA: Diagnosis present

## 2012-06-11 DIAGNOSIS — I252 Old myocardial infarction: Secondary | ICD-10-CM

## 2012-06-11 DIAGNOSIS — I2589 Other forms of chronic ischemic heart disease: Secondary | ICD-10-CM | POA: Diagnosis present

## 2012-06-11 DIAGNOSIS — D649 Anemia, unspecified: Secondary | ICD-10-CM | POA: Diagnosis not present

## 2012-06-11 DIAGNOSIS — I2510 Atherosclerotic heart disease of native coronary artery without angina pectoris: Secondary | ICD-10-CM | POA: Diagnosis present

## 2012-06-11 DIAGNOSIS — E785 Hyperlipidemia, unspecified: Secondary | ICD-10-CM | POA: Diagnosis present

## 2012-06-11 DIAGNOSIS — I10 Essential (primary) hypertension: Secondary | ICD-10-CM | POA: Diagnosis present

## 2012-06-11 DIAGNOSIS — N179 Acute kidney failure, unspecified: Secondary | ICD-10-CM | POA: Diagnosis present

## 2012-06-11 DIAGNOSIS — I480 Paroxysmal atrial fibrillation: Secondary | ICD-10-CM | POA: Diagnosis present

## 2012-06-11 DIAGNOSIS — Z794 Long term (current) use of insulin: Secondary | ICD-10-CM | POA: Diagnosis present

## 2012-06-11 DIAGNOSIS — Z7901 Long term (current) use of anticoagulants: Secondary | ICD-10-CM

## 2012-06-11 DIAGNOSIS — E1169 Type 2 diabetes mellitus with other specified complication: Secondary | ICD-10-CM | POA: Diagnosis present

## 2012-06-11 DIAGNOSIS — I5042 Chronic combined systolic (congestive) and diastolic (congestive) heart failure: Secondary | ICD-10-CM | POA: Diagnosis present

## 2012-06-11 DIAGNOSIS — E11621 Type 2 diabetes mellitus with foot ulcer: Secondary | ICD-10-CM | POA: Diagnosis present

## 2012-06-11 DIAGNOSIS — Z95810 Presence of automatic (implantable) cardiac defibrillator: Secondary | ICD-10-CM | POA: Diagnosis present

## 2012-06-11 DIAGNOSIS — L97509 Non-pressure chronic ulcer of other part of unspecified foot with unspecified severity: Secondary | ICD-10-CM | POA: Diagnosis present

## 2012-06-11 DIAGNOSIS — I739 Peripheral vascular disease, unspecified: Secondary | ICD-10-CM | POA: Diagnosis present

## 2012-06-11 DIAGNOSIS — Z4502 Encounter for adjustment and management of automatic implantable cardiac defibrillator: Secondary | ICD-10-CM

## 2012-06-11 DIAGNOSIS — I5022 Chronic systolic (congestive) heart failure: Secondary | ICD-10-CM | POA: Diagnosis present

## 2012-06-11 DIAGNOSIS — I4891 Unspecified atrial fibrillation: Secondary | ICD-10-CM | POA: Diagnosis present

## 2012-06-11 DIAGNOSIS — M109 Gout, unspecified: Secondary | ICD-10-CM | POA: Diagnosis present

## 2012-06-11 DIAGNOSIS — E876 Hypokalemia: Secondary | ICD-10-CM | POA: Diagnosis present

## 2012-06-11 DIAGNOSIS — E039 Hypothyroidism, unspecified: Secondary | ICD-10-CM | POA: Diagnosis present

## 2012-06-11 DIAGNOSIS — I255 Ischemic cardiomyopathy: Secondary | ICD-10-CM | POA: Diagnosis present

## 2012-06-11 DIAGNOSIS — I509 Heart failure, unspecified: Secondary | ICD-10-CM | POA: Diagnosis present

## 2012-06-11 DIAGNOSIS — E119 Type 2 diabetes mellitus without complications: Secondary | ICD-10-CM | POA: Diagnosis present

## 2012-06-11 DIAGNOSIS — N189 Chronic kidney disease, unspecified: Secondary | ICD-10-CM | POA: Diagnosis present

## 2012-06-11 DIAGNOSIS — I4901 Ventricular fibrillation: Principal | ICD-10-CM | POA: Diagnosis present

## 2012-06-11 DIAGNOSIS — D638 Anemia in other chronic diseases classified elsewhere: Secondary | ICD-10-CM | POA: Diagnosis present

## 2012-06-11 DIAGNOSIS — R768 Other specified abnormal immunological findings in serum: Secondary | ICD-10-CM | POA: Diagnosis present

## 2012-06-11 DIAGNOSIS — N184 Chronic kidney disease, stage 4 (severe): Secondary | ICD-10-CM | POA: Diagnosis present

## 2012-06-11 DIAGNOSIS — Z951 Presence of aortocoronary bypass graft: Secondary | ICD-10-CM

## 2012-06-11 HISTORY — DX: Chronic systolic (congestive) heart failure: I50.22

## 2012-06-11 HISTORY — DX: Encounter for adjustment and management of automatic implantable cardiac defibrillator: Z45.02

## 2012-06-11 LAB — COMPREHENSIVE METABOLIC PANEL
ALT: 6 U/L (ref 0–53)
AST: 21 U/L (ref 0–37)
Albumin: 3.2 g/dL — ABNORMAL LOW (ref 3.5–5.2)
Alkaline Phosphatase: 106 U/L (ref 39–117)
BUN: 86 mg/dL — ABNORMAL HIGH (ref 6–23)
Chloride: 96 mEq/L (ref 96–112)
Potassium: 3.2 mEq/L — ABNORMAL LOW (ref 3.5–5.1)
Total Bilirubin: 0.8 mg/dL (ref 0.3–1.2)

## 2012-06-11 LAB — MAGNESIUM: Magnesium: 2.5 mg/dL (ref 1.5–2.5)

## 2012-06-11 LAB — URINALYSIS, ROUTINE W REFLEX MICROSCOPIC
Ketones, ur: NEGATIVE mg/dL
Leukocytes, UA: NEGATIVE
Nitrite: NEGATIVE
Specific Gravity, Urine: 1.012 (ref 1.005–1.030)
pH: 5.5 (ref 5.0–8.0)

## 2012-06-11 LAB — CBC
HCT: 35 % — ABNORMAL LOW (ref 39.0–52.0)
RDW: 15.5 % (ref 11.5–15.5)
WBC: 13.7 10*3/uL — ABNORMAL HIGH (ref 4.0–10.5)

## 2012-06-11 LAB — DIFFERENTIAL
Basophils Absolute: 0 10*3/uL (ref 0.0–0.1)
Basophils Relative: 0 % (ref 0–1)
Eosinophils Absolute: 0.1 10*3/uL (ref 0.0–0.7)
Eosinophils Relative: 1 % (ref 0–5)
Monocytes Absolute: 1.2 10*3/uL — ABNORMAL HIGH (ref 0.1–1.0)
Monocytes Relative: 8 % (ref 3–12)

## 2012-06-11 LAB — GLUCOSE, CAPILLARY
Glucose-Capillary: 56 mg/dL — ABNORMAL LOW (ref 70–99)
Glucose-Capillary: 63 mg/dL — ABNORMAL LOW (ref 70–99)

## 2012-06-11 MED ORDER — ACETAMINOPHEN 650 MG RE SUPP: 650.0000 mg | Freq: Four times a day (QID) | RECTAL | Status: DC | PRN

## 2012-06-11 MED ORDER — DEXTROSE 5 % IV SOLN
1.0000 g | INTRAVENOUS | Status: DC
  Administered 2012-06-11 – 2012-06-17 (×7): 1 g via INTRAVENOUS
  Filled 2012-06-11 (×11): qty 10

## 2012-06-11 MED ORDER — PANTOPRAZOLE SODIUM 40 MG PO TBEC
80.0000 mg | DELAYED_RELEASE_TABLET | Freq: Every day | ORAL | Status: DC
  Administered 2012-06-11 – 2012-06-18 (×8): 80 mg via ORAL
  Filled 2012-06-11: qty 2
  Filled 2012-06-11: qty 1
  Filled 2012-06-11 (×3): qty 2
  Filled 2012-06-11: qty 1
  Filled 2012-06-11: qty 2
  Filled 2012-06-11: qty 4
  Filled 2012-06-11: qty 2

## 2012-06-11 MED ORDER — SIMVASTATIN 40 MG PO TABS
40.0000 mg | ORAL_TABLET | Freq: Every day | ORAL | Status: DC
  Administered 2012-06-11 – 2012-06-17 (×7): 40 mg via ORAL
  Filled 2012-06-11 (×9): qty 1

## 2012-06-11 MED ORDER — ALLOPURINOL 100 MG PO TABS
100.0000 mg | ORAL_TABLET | Freq: Every day | ORAL | Status: DC
  Administered 2012-06-11 – 2012-06-18 (×8): 100 mg via ORAL
  Filled 2012-06-11 (×8): qty 1

## 2012-06-11 MED ORDER — ACETAMINOPHEN 325 MG PO TABS: 650.0000 mg | ORAL_TABLET | Freq: Four times a day (QID) | ORAL | Status: DC | PRN

## 2012-06-11 MED ORDER — HEPARIN (PORCINE) IN NACL 100-0.45 UNIT/ML-% IJ SOLN
1400.0000 [IU]/h | INTRAMUSCULAR | Status: DC
  Administered 2012-06-11: 1100 [IU]/h via INTRAVENOUS
  Administered 2012-06-12 (×2): 1400 [IU]/h via INTRAVENOUS
  Filled 2012-06-11 (×4): qty 250

## 2012-06-11 MED ORDER — CARVEDILOL 12.5 MG PO TABS
12.5000 mg | ORAL_TABLET | Freq: Two times a day (BID) | ORAL | Status: DC
  Administered 2012-06-11 – 2012-06-13 (×4): 12.5 mg via ORAL
  Filled 2012-06-11 (×8): qty 1

## 2012-06-11 MED ORDER — ZOLPIDEM TARTRATE 5 MG PO TABS: 5.0000 mg | ORAL_TABLET | Freq: Every evening | ORAL | Status: DC | PRN

## 2012-06-11 MED ORDER — HEPARIN SODIUM (PORCINE) 5000 UNIT/ML IJ SOLN
5000.0000 [IU] | Freq: Three times a day (TID) | INTRAMUSCULAR | Status: DC
  Filled 2012-06-11 (×2): qty 1

## 2012-06-11 MED ORDER — ALUM & MAG HYDROXIDE-SIMETH 200-200-20 MG/5ML PO SUSP: 30.0000 mL | Freq: Four times a day (QID) | ORAL | Status: DC | PRN

## 2012-06-11 MED ORDER — ISOSORBIDE DINITRATE 20 MG PO TABS
20.0000 mg | ORAL_TABLET | Freq: Three times a day (TID) | ORAL | Status: DC
  Administered 2012-06-11 – 2012-06-18 (×18): 20 mg via ORAL
  Filled 2012-06-11 (×23): qty 1

## 2012-06-11 MED ORDER — ONDANSETRON HCL 4 MG PO TABS: 4.0000 mg | ORAL_TABLET | Freq: Four times a day (QID) | ORAL | Status: DC | PRN

## 2012-06-11 MED ORDER — LEVOTHYROXINE SODIUM 50 MCG PO TABS
50.0000 ug | ORAL_TABLET | Freq: Every day | ORAL | Status: DC
  Administered 2012-06-12 – 2012-06-18 (×7): 50 ug via ORAL
  Filled 2012-06-11 (×8): qty 1

## 2012-06-11 MED ORDER — ONDANSETRON HCL 4 MG/2ML IJ SOLN: 4.0000 mg | Freq: Four times a day (QID) | INTRAMUSCULAR | Status: DC | PRN

## 2012-06-11 MED ORDER — INSULIN ASPART 100 UNIT/ML ~~LOC~~ SOLN
0.0000 [IU] | Freq: Every day | SUBCUTANEOUS | Status: DC
  Administered 2012-06-12 – 2012-06-15 (×2): 2 [IU] via SUBCUTANEOUS

## 2012-06-11 MED ORDER — POLYETHYLENE GLYCOL 3350 17 G PO PACK
17.0000 g | PACK | Freq: Every day | ORAL | Status: DC | PRN
  Filled 2012-06-11: qty 1

## 2012-06-11 MED ORDER — MORPHINE SULFATE 2 MG/ML IJ SOLN: 2.0000 mg | INTRAMUSCULAR | Status: DC | PRN

## 2012-06-11 MED ORDER — DOCUSATE SODIUM 100 MG PO CAPS
100.0000 mg | ORAL_CAPSULE | Freq: Two times a day (BID) | ORAL | Status: DC
  Administered 2012-06-11 – 2012-06-18 (×13): 100 mg via ORAL
  Filled 2012-06-11 (×16): qty 1

## 2012-06-11 MED ORDER — SODIUM CHLORIDE 0.9 % IV SOLN
INTRAVENOUS | Status: DC
  Administered 2012-06-11 – 2012-06-12 (×2): via INTRAVENOUS

## 2012-06-11 MED ORDER — HYDRALAZINE HCL 25 MG PO TABS
25.0000 mg | ORAL_TABLET | Freq: Three times a day (TID) | ORAL | Status: DC
  Administered 2012-06-11 – 2012-06-18 (×19): 25 mg via ORAL
  Filled 2012-06-11 (×23): qty 1

## 2012-06-11 MED ORDER — INSULIN GLARGINE 100 UNIT/ML ~~LOC~~ SOLN
10.0000 [IU] | Freq: Two times a day (BID) | SUBCUTANEOUS | Status: DC
  Administered 2012-06-11 – 2012-06-12 (×2): 10 [IU] via SUBCUTANEOUS

## 2012-06-11 MED ORDER — SODIUM CHLORIDE 0.9 % IJ SOLN: 3.0000 mL | Freq: Two times a day (BID) | INTRAMUSCULAR | Status: DC

## 2012-06-11 MED ORDER — INSULIN GLARGINE 100 UNIT/ML ~~LOC~~ SOLN: 20.0000 [IU] | Freq: Two times a day (BID) | SUBCUTANEOUS | Status: DC

## 2012-06-11 MED ORDER — HYDROCODONE-ACETAMINOPHEN 5-325 MG PO TABS
1.0000 | ORAL_TABLET | ORAL | Status: DC | PRN
  Administered 2012-06-11 – 2012-06-17 (×17): 2 via ORAL
  Filled 2012-06-11 (×2): qty 2
  Filled 2012-06-11: qty 1
  Filled 2012-06-11: qty 2
  Filled 2012-06-11: qty 1
  Filled 2012-06-11 (×11): qty 2
  Filled 2012-06-11: qty 1
  Filled 2012-06-11 (×4): qty 2

## 2012-06-11 MED ORDER — INSULIN ASPART 100 UNIT/ML ~~LOC~~ SOLN
0.0000 [IU] | Freq: Three times a day (TID) | SUBCUTANEOUS | Status: DC
  Administered 2012-06-12 (×2): 5 [IU] via SUBCUTANEOUS
  Administered 2012-06-12 – 2012-06-14 (×2): 2 [IU] via SUBCUTANEOUS
  Administered 2012-06-14: 3 [IU] via SUBCUTANEOUS
  Administered 2012-06-14 – 2012-06-15 (×2): 5 [IU] via SUBCUTANEOUS
  Administered 2012-06-16 (×3): 3 [IU] via SUBCUTANEOUS
  Administered 2012-06-17 – 2012-06-18 (×3): 5 [IU] via SUBCUTANEOUS

## 2012-06-11 MED ORDER — POTASSIUM CHLORIDE CRYS ER 20 MEQ PO TBCR
40.0000 meq | EXTENDED_RELEASE_TABLET | ORAL | Status: AC
  Administered 2012-06-11: 40 meq via ORAL
  Filled 2012-06-11: qty 2

## 2012-06-11 MED ORDER — POTASSIUM CHLORIDE CRYS ER 20 MEQ PO TBCR: 40.0000 meq | EXTENDED_RELEASE_TABLET | Freq: Once | ORAL | Status: DC

## 2012-06-11 NOTE — Progress Notes (Signed)
cbg- 56 asymptomatic, orange juice given tolerated well. Repeat after 15 min - 62 asymptomatic. [facility] nurse practitioner made aware  Dinner served took 1 carton of milk .Repeat cbg at 6pm - is 33. Continue to monitor.

## 2012-06-11 NOTE — Progress Notes (Signed)
ANTICOAGULATION CONSULT NOTE - Initial Consult  Pharmacy Consult for Heparin Indication: atrial fibrillation  No Known Allergies  Patient Measurements: Height: 6\' 2"  (188 cm) Weight: 202 lb 2.6 oz (91.7 kg) IBW/kg (Calculated) : 82.2  Heparin Dosing Weight: 91.7 kg  Vital Signs: Temp: 97.7 F (36.5 C) (07/10 1553) Temp src: Oral (07/10 1553) BP: 146/55 mmHg (07/10 1900) Pulse Rate: 64  (07/10 1900)  Labs:  Basename 06/11/12 1744  HGB 11.7*  HCT 35.0*  PLT 249  APTT 41*  LABPROT 21.9*  INR 1.88*  HEPARINUNFRC --  CREATININE 3.13*  CKTOTAL --  CKMB --  TROPONINI --    Estimated Creatinine Clearance: 24.1 ml/min (by C-G formula based on Cr of 3.13).   Medical History: Past Medical History  Diagnosis Date  . Shortness of breath   . Anemia   . Chronic kidney disease     renal insufficiency  . CHF (congestive heart failure)   . Dysrhythmia     atrial fibrilation  . Myocardial infarction   . COPD (chronic obstructive pulmonary disease)   . Diabetes mellitus   . Coronary artery disease   . Gout 05/28/2012  . Paroxysmal atrial fibrillation 11/05/2011    permanent   . Hypothyroidism 05/28/2012  . ICD (implantable cardiac defibrillator) discharge 06/11/2012  . Acute on chronic renal failure 06/11/2012  . ICD (implantable cardiac defibrillator) in place   . Chronic systolic CHF (congestive heart failure) 06/11/2012    Medications:  Scheduled:    . allopurinol  100 mg Oral Daily  . carvedilol  12.5 mg Oral BID WC  . cefTRIAXone (ROCEPHIN)  IV  1 g Intravenous Q24H  . docusate sodium  100 mg Oral BID  . heparin  5,000 Units Subcutaneous Q8H  . hydrALAZINE  25 mg Oral TID  . insulin aspart  0-15 Units Subcutaneous TID WC  . insulin aspart  0-5 Units Subcutaneous QHS  . insulin glargine  10 Units Subcutaneous BID  . isosorbide dinitrate  20 mg Oral TID  . levothyroxine  50 mcg Oral QAC breakfast  . pantoprazole  80 mg Oral Q1200  . potassium chloride  40 mEq  Oral STAT  . simvastatin  40 mg Oral QHS  . sodium chloride  3 mL Intravenous Q12H  . DISCONTD: insulin glargine  20 Units Subcutaneous BID  . DISCONTD: potassium chloride  40 mEq Oral Once    Assessment: 77 yr old male admitted after ICD firing.  On chronic Coumadin for afib; however, INR is subtherapeutic today.  No bleeding noted.  Coumadin on hold for LE angio on Friday.  Per patient, last Coumadin dose was Monday (7/8) evening.  Home Coumadin dose is 2.5 mg daily except 5 mg on Tues and Fridays.  Goal of Therapy:  Heparin level 0.3-0.7 units/ml Monitor platelets by anticoagulation protocol: Yes   Plan:  1. Start IV heparin gtt with no bolus at 1100 units/hr. 2. Check heparin level 8 hrs after gtt starts. 3. Daily heparin level and CBC. 4. Will d/c orders for SQ heparin.  Shalon Salado C 06/11/2012,7:45 PM

## 2012-06-11 NOTE — H&P (Signed)
[facility]       CONSULTATION NOTE   Reason for Admission: 1. Acute on chronic renal failure 2. Hypokalemia 3. S/P ventricular fibrillation with successful defibrillation via ICD  Admitting Physician: Thurmon Fair, MD, [facility] [phone] office [phone] pager   Cardiologist: Thurmon Fair, MD, [facility]  HPI:  His ICD appropriately discharged yesterday for VF, around 10 AM. He was in bed at the time and thinks he was asleep. He was completely unaware of the shock, which we were notified of via the remote monitoring [employer] system. Labs done yesterday show a potassium of 3.0 and creatinine of 3.3 (baseline around 2.0, 1.94 on 05/26/12).  This is a 77 y.o. male with a past medical history significant for CAD s/p CABG, severe ischemic cardiomyopathy (EF 25-30%), congestive heart failure (systolic and diastolic, chronic), permanent atrial fibrillation, severe PAD, chronic kidney disease stage IV, insulin requiring type II diabetes mellitus, recent autoimmune hemolytic anemia, tophaceous gout and rheumatoid arthritis. He was recently admitted to [facility] for infected left foot diabetic ulcer and ischemic foot.Cultures grew group A strep, treated with IV Zosyn and vanco, then 10 day course of Augmentin. ABI was 0.61 by arterial Dopplers and he is scheduled for LLE angio on Friday with Dr. Allyson Sabal.   He denies angina and his dyspnea is at baseline. Appetite is poor and he has lost at least 18 pounds since late May.  PMHx:  Past Medical History  Diagnosis Date  . Shortness of breath   . Anemia   . Chronic kidney disease     renal insufficiency  . CHF (congestive heart failure)   . Dysrhythmia     atrial fibrilation  . Myocardial infarction   . COPD (chronic obstructive pulmonary disease)   . Diabetes mellitus   . Coronary artery disease   . Gout 05/28/2012  . Paroxysmal atrial fibrillation 11/05/2011    permanent    . Hypothyroidism 05/28/2012   Past Surgical History  Procedure Date  . Coronary artery bypass graft     FAMHx: No family history on file.  SOCHx:  reports that he quit smoking about 8 years ago. He has never used smokeless tobacco. He reports that he does not drink alcohol or use illicit drugs.  ALLERGIES: No Known Allergies  ROS: Constitutional: positive for anorexia, malaise and weight loss, negative for chills and fevers Eyes: negative Ears, nose, mouth, throat, and face: negative Respiratory: positive for dyspnea on exertion, negative for cough, sputum and wheezing Cardiovascular: positive for fatigue and lower extremity edema, negative for chest pressure/discomfort, palpitations, paroxysmal nocturnal dyspnea and syncope Gastrointestinal: positive for constipation, negative for abdominal pain, melena, nausea and vomiting Genitourinary:negative Integument/breast: positive for skin lesion(s) Hematologic/lymphatic: negative for bleeding, easy bruising and petechiae Musculoskeletal:positive for myalgias  HOME MEDICATIONS: Prescriptions prior to admission  Medication Sig Dispense Refill  . allopurinol (ZYLOPRIM) 300 MG tablet Take 450 mg by mouth every morning.       . carvedilol (COREG) 25 MG tablet Take 12.5 mg by mouth 2 (two) times daily.       . furosemide (LASIX) 80 MG tablet Take 80 mg by mouth 2 (two) times daily.      . hydrALAZINE (APRESOLINE) 25 MG tablet Take 25 mg by mouth 3 (three) times daily.      . insulin aspart (NOVOLOG) 100 UNIT/ML injection Inject 10-15 Units into the skin 3 (three) times daily before meals.       Marland Kitchen  insulin glargine (LANTUS) 100 UNIT/ML injection Inject 50 Units into the skin 2 (two) times daily.  10 mL    . isosorbide dinitrate (ISORDIL) 20 MG tablet Take 20 mg by mouth 3 (three) times daily.      Marland Kitchen levothyroxine (SYNTHROID, LEVOTHROID) 50 MCG tablet Take 50 mcg by mouth every morning.       . metolazone (ZAROXOLYN) 2.5 MG tablet Take 2.5  mg by mouth daily. States he takes with his furosemide every day except on Tuesday and Friday      . omeprazole (PRILOSEC) 20 MG capsule Take 40 mg by mouth every morning.       . potassium chloride SA (K-DUR,KLOR-CON) 20 MEQ tablet Take 2 tablets (40 mEq total) by mouth 2 (two) times daily.  100 tablet  5  . simvastatin (ZOCOR) 40 MG tablet Take 40 mg by mouth at bedtime.       Marland Kitchen warfarin (COUMADIN) 5 MG tablet Take 2.5-5 mg by mouth every evening. Takes one tablet (5mg ) every day except on Tuesdays and Fridays, take one-half tablet (2.5mg )        HOSPITAL MEDICATIONS: I have reviewed the patient's current medications.  VITALS: Blood pressure 120/61, pulse 70, temperature 97.7 F (36.5 C), temperature source Oral, resp. rate 13, height 6\' 2"  (1.88 m), weight 91.7 kg (202 lb 2.6 oz), SpO2 97.00%.  PHYSICAL EXAM: General appearance: alert, cooperative, no distress and unusually disheveled for him Neck: no adenopathy, no JVD, supple, symmetrical, trachea midline and thyroid not enlarged, symmetric, no tenderness/mass/nodules Lungs: clear to auscultation bilaterally Heart: irregularly irregular rhythm, S1, S2 normal, no S3 or S4 and systolic murmur: holosystolic 2/6, blowing at lower left sternal border Abdomen: soft, non-tender; bowel sounds normal; no masses,  no organomegaly Extremities: edema 3+, left.right ankle; dressing left foot Pulses: absent left foot Skin: draining left heel wound Neurologic: Grossly normal  LABS: No results found for this or any previous visit (from the past 48 hour(s)).  IMAGING: No results found.  DIAGNOSES: Active Problems:  * No active hospital problems. *    IMPRESSION: 1. Ventricular fibrillation with appropriate ICD shock and successful conversion to SR, likely precipitated by hypokalemia 2. Acute on chronic renal failure and hypokalemia, most likely related to reduced intake of fluids and food and continued diuretic therapy 3. Infected diabetic  foot ulcer and limb ischemia/threatened limb 4. Severe ischemic cardiomyopathy 5. Systolic and diastolic heart failure, currently hypovolemic  RECOMMENDATION: 1. Admit for fluid rehydration and correction of electrolytes. 2. Currently still on schedule for LE angio on Friday, but whether or not that timing is appropriate depends on renal function developments. He has made it perfectly clear (repeatedly) that he will not consider hemodialysis under any circumstances: "I would rather die". 3. Continue therapy for HF and CAD  Time Spent Directly with Patient: 50 minutes   Thurmon Fair, MD, [facility] [phone] office [phone] pager 06/11/2012 4:19 PM

## 2012-06-12 LAB — CBC
MCH: 28.7 pg (ref 26.0–34.0)
MCV: 88.5 fL (ref 78.0–100.0)
Platelets: 231 10*3/uL (ref 150–400)
RBC: 3.49 MIL/uL — ABNORMAL LOW (ref 4.22–5.81)

## 2012-06-12 LAB — GLUCOSE, CAPILLARY
Glucose-Capillary: 201 mg/dL — ABNORMAL HIGH (ref 70–99)
Glucose-Capillary: 204 mg/dL — ABNORMAL HIGH (ref 70–99)

## 2012-06-12 LAB — BASIC METABOLIC PANEL
BUN: 85 mg/dL — ABNORMAL HIGH (ref 6–23)
GFR calc Af Amer: 22 mL/min — ABNORMAL LOW (ref >90)
GFR calc non Af Amer: 19 mL/min — ABNORMAL LOW (ref >90)
Potassium: 3 mEq/L — ABNORMAL LOW (ref 3.5–5.1)
Sodium: 136 mEq/L (ref 135–145)

## 2012-06-12 LAB — PROTIME-INR: Prothrombin Time: 22.4 seconds — ABNORMAL HIGH (ref 11.6–15.2)

## 2012-06-12 LAB — HEMOGLOBIN A1C: Mean Plasma Glucose: 189 mg/dL — ABNORMAL HIGH (ref <117)

## 2012-06-12 LAB — POTASSIUM: Potassium: 3.9 mEq/L (ref 3.5–5.1)

## 2012-06-12 MED ORDER — INSULIN GLARGINE 100 UNIT/ML ~~LOC~~ SOLN
10.0000 [IU] | Freq: Two times a day (BID) | SUBCUTANEOUS | Status: DC
  Administered 2012-06-12 – 2012-06-18 (×11): 10 [IU] via SUBCUTANEOUS

## 2012-06-12 MED ORDER — POTASSIUM CHLORIDE CRYS ER 20 MEQ PO TBCR
40.0000 meq | EXTENDED_RELEASE_TABLET | Freq: Once | ORAL | Status: AC
  Administered 2012-06-12: 40 meq via ORAL
  Filled 2012-06-12: qty 2

## 2012-06-12 NOTE — Progress Notes (Signed)
Subjective:  No SOB; No cp  Objective:  Vital Signs in the last 24 hours: Temp:  [97.6 F (36.4 C)-98.6 F (37 C)] 97.6 F (36.4 C) (07/11 0802) Pulse Rate:  [41-79] 41  (07/11 0700) Resp:  [13-24] 18  (07/11 0700) BP: (108-146)/(44-65) 127/51 mmHg (07/11 0700) SpO2:  [92 %-98 %] 96 % (07/11 0700) Weight:  [91.7 kg (202 lb 2.6 oz)-94.1 kg (207 lb 7.3 oz)] 94.1 kg (207 lb 7.3 oz) (07/11 0500)  Intake/Output from previous day:  Intake/Output Summary (Last 24 hours) at 06/12/12 0908 Last data filed at 06/12/12 0700  Gross per 24 hour  Intake 1104.75 ml  Output    825 ml  Net 279.75 ml    Physical Exam: General appearance: alert, cooperative and no distress Lungs: clear to auscultation bilaterally Heart: irregularly irregular rhythm Abd: BS+ Ext: left ankle bandaged 2 + edema, 1+R   Rate: 60  Rhythm: sinus arrhythmia and 3bt NSVT, POD  Lab Results:  Basename 06/12/12 0400 06/11/12 1744  WBC 11.7* 13.7*  HGB 10.0* 11.7*  PLT 231 249    Basename 06/12/12 0410 06/11/12 1744  NA 136 140  K 3.0* 3.2*  CL 96 96  CO2 27 28  GLUCOSE 197* 52*  BUN 85* 86*  CREATININE 2.97* 3.13*   No results found for this basename: TROPONINI:2,CK,MB:2 in the last 72 hours Hepatic Function Panel  Basename 06/11/12 1744  PROT 7.6  ALBUMIN 3.2*  AST 21  ALT 6  ALKPHOS 106  BILITOT 0.8  BILIDIR --  IBILI --   No results found for this basename: CHOL in the last 72 hours  Basename 06/12/12 0410  INR 1.93*    Imaging: Imaging results have been reviewed  Cardiac Studies:  Assessment/Plan:   Principal Problem:  *ICD discharge in setting of K+ 3.0 Active Problems:  Acute on chronic renal failure, SCr 2.3-3.3 secondary to dehydration  CKD (chronic kidney disease) stage 4, GFR 15-29 ml/min  Hypokalemia  Ischemic cardiomyopathy, EF 25% 2D 12/12  PVD, pt scheduled for LLE PVA 06/13/12  Chronic anticoagulation  Non-sustained ventricular tachycardia  Diabetic foot  infection, just discharged 05/30/12  Chronic systolic CHF (congestive heart failure)  Hx of CABG x 6 2003  ICD (implantable cardiac defibrillator) in place  Chronic a-fib  Diabetes mellitus type 2, insulin dependent  Positive Coombs test  Anemia due to chronic illness  HTN (hypertension)  Dyslipidemia  Sleep apnea, C-Pap intol  Gout  Hypothyroidism  Plan- Being gently hydrated, SCr slightly better- 2.97, K+ still low- 3.0, being replaced, will check follow up K+ this pm. Coumadin on hold for possible PVA Friday, INR 1.9 today.    Corine Shelter PA-C 06/12/2012, 9:08 AM   Patient seen and examined. Agree with assessment and plan. Getting gentle hydration for renal insuffiencey. Cr 2.97 today, decreased from 3.13 yesterday.  Suspect may not be ready for PV angio tomorrow. K replete, check Mg. Coumadin on hold.   Lennette Bihari, MD, [facility] 06/12/2012 10:56 AM

## 2012-06-12 NOTE — Progress Notes (Signed)
ANTICOAGULATION CONSULT NOTE - Follow Up Consult  Pharmacy Consult for heparin Indication: atrial fibrillation  Labs:  Basename 06/12/12 1300 06/12/12 0410 06/12/12 0400 06/11/12 1744  HGB -- -- 10.0* 11.7*  HCT -- -- 30.9* 35.0*  PLT -- -- 231 249  APTT -- -- -- 41*  LABPROT -- 22.4* -- 21.9*  INR -- 1.93* -- 1.88*  HEPARINUNFRC 0.50 <0.10* -- --  CREATININE -- 2.97* -- 3.13*  CKTOTAL -- -- -- --  CKMB -- -- -- --  TROPONINI -- -- -- --    Assessment: 77yo male on heparin for Afib while Coumadin on hold. Heparin level = 0.5 after rate increased to 1400 units/hr = therapeutic. H/H 10/31 from 11.7/35, pltc stable. No bleeding reported.  May have cath tomorrow.  Goal of Therapy:  Heparin level 0.3-0.7 units/ml   Plan:  Continue heparin at 1400 units/hr  Daily HL and CBC Herby Abraham, Pharm.D. [phone] 06/12/2012 2:23 PM

## 2012-06-12 NOTE — Care Management Note (Signed)
    Page 1 of 1   06/12/2012     7:37:07 AM   CARE MANAGEMENT NOTE 06/12/2012  Patient:  STRYKER, VEASEY   Account Number:  [id number]  Date Initiated:  06/12/2012  Documentation initiated by:  Junius Creamer  Subjective/Objective Assessment:   adm w v fib     Action/Plan:   lives alone, pcp dr Assunta Found   Anticipated DC Date:     Anticipated DC Plan:        DC Planning Services  CM consult      Choice offered to / List presented to:             Status of service:   Medicare Important Message given?   (If response is "NO", the following Medicare IM given date fields will be blank) Date Medicare IM given:   Date Additional Medicare IM given:    Discharge Disposition:    Per UR Regulation:  Reviewed for med. necessity/level of care/duration of stay  If discussed at Long Length of Stay Meetings, dates discussed:    Comments:  7/11 7:36a debbie Damiano Stamper rn,bsn [phone]

## 2012-06-12 NOTE — Progress Notes (Signed)
ANTICOAGULATION CONSULT NOTE - Follow Up Consult  Pharmacy Consult for heparin Indication: atrial fibrillation  Labs:  Basename 06/12/12 0410 06/12/12 0400 06/11/12 1744  HGB -- 10.0* 11.7*  HCT -- 30.9* 35.0*  PLT -- 231 249  APTT -- -- 41*  LABPROT 22.4* -- 21.9*  INR 1.93* -- 1.88*  HEPARINUNFRC <0.10* -- --  CREATININE 2.97* -- 3.13*  CKTOTAL -- -- --  CKMB -- -- --  TROPONINI -- -- --    Assessment: 77yo male undetectable on heparin with initial dosing for Afib while Coumadin on hold.  Goal of Therapy:  Heparin level 0.3-0.7 units/ml   Plan:  Will increase heparin gtt by 3 units/kg/hr to 1400 units/hr and check level in 8hr.  Colleen Can PharmD BCPS 06/12/2012,5:07 AM

## 2012-06-13 ENCOUNTER — Encounter (HOSPITAL_COMMUNITY)
Admission: AD | Disposition: A | Discharge status: Home / Self Care | Payer: Self-pay | Source: Ambulatory Visit | Attending: Cardiovascular Disease

## 2012-06-13 HISTORY — PX: LOWER EXTREMITY ANGIOGRAM: SHX5508

## 2012-06-13 LAB — CBC
MCH: 28.5 pg (ref 26.0–34.0)
Platelets: 237 10*3/uL (ref 150–400)
RBC: 3.55 MIL/uL — ABNORMAL LOW (ref 4.22–5.81)
WBC: 10.3 10*3/uL (ref 4.0–10.5)

## 2012-06-13 LAB — BASIC METABOLIC PANEL
BUN: 72 mg/dL — ABNORMAL HIGH (ref 6–23)
CO2: 24 mEq/L (ref 19–32)
Calcium: 9.3 mg/dL (ref 8.4–10.5)
Chloride: 99 mEq/L (ref 96–112)
Creatinine, Ser: 2.3 mg/dL — ABNORMAL HIGH (ref 0.50–1.35)
GFR calc Af Amer: 30 mL/min — ABNORMAL LOW (ref >90)
GFR calc non Af Amer: 26 mL/min — ABNORMAL LOW (ref >90)
Glucose, Bld: 168 mg/dL — ABNORMAL HIGH (ref 70–99)
Potassium: 3.6 mEq/L (ref 3.5–5.1)
Sodium: 136 mEq/L (ref 135–145)

## 2012-06-13 LAB — POCT ACTIVATED CLOTTING TIME
Activated Clotting Time: 195 seconds
Activated Clotting Time: 184 seconds

## 2012-06-13 LAB — PROTIME-INR
INR: 1.91 — ABNORMAL HIGH (ref 0.00–1.49)
INR: 1.85 — ABNORMAL HIGH (ref 0.00–1.49)
Prothrombin Time: 22.2 seconds — ABNORMAL HIGH (ref 11.6–15.2)
Prothrombin Time: 21.7 seconds — ABNORMAL HIGH (ref 11.6–15.2)

## 2012-06-13 LAB — GLUCOSE, CAPILLARY: Glucose-Capillary: 147 mg/dL — ABNORMAL HIGH (ref 70–99)

## 2012-06-13 SURGERY — ANGIOGRAM, LOWER EXTREMITY
Anesthesia: LOCAL | Laterality: Left

## 2012-06-13 MED ORDER — MORPHINE SULFATE 4 MG/ML IJ SOLN
1.0000 mg | INTRAMUSCULAR | Status: DC | PRN
  Administered 2012-06-13: 1 mg via INTRAVENOUS

## 2012-06-13 MED ORDER — ONDANSETRON HCL 4 MG/2ML IJ SOLN: 4.0000 mg | Freq: Four times a day (QID) | INTRAMUSCULAR | Status: DC | PRN

## 2012-06-13 MED ORDER — HEPARIN (PORCINE) IN NACL 2-0.9 UNIT/ML-% IJ SOLN
INTRAMUSCULAR | Status: AC
  Filled 2012-06-13: qty 1000

## 2012-06-13 MED ORDER — SODIUM CHLORIDE 0.9 % IV SOLN: 250.0000 mL | INTRAVENOUS | Status: DC | PRN

## 2012-06-13 MED ORDER — LIDOCAINE HCL (PF) 1 % IJ SOLN
INTRAMUSCULAR | Status: AC
  Filled 2012-06-13: qty 30

## 2012-06-13 MED ORDER — MORPHINE SULFATE 4 MG/ML IJ SOLN
INTRAMUSCULAR | Status: AC
  Filled 2012-06-13: qty 1

## 2012-06-13 MED ORDER — ACETAMINOPHEN 325 MG PO TABS: 650.0000 mg | ORAL_TABLET | ORAL | Status: DC | PRN

## 2012-06-13 MED ORDER — HEPARIN (PORCINE) IN NACL 100-0.45 UNIT/ML-% IJ SOLN
1700.0000 [IU]/h | INTRAMUSCULAR | Status: DC
  Administered 2012-06-14 – 2012-06-17 (×4): 1600 [IU]/h via INTRAVENOUS
  Administered 2012-06-17: 1700 [IU]/h via INTRAVENOUS
  Filled 2012-06-13 (×8): qty 250

## 2012-06-13 MED ORDER — SODIUM CHLORIDE 0.9 % IJ SOLN: 3.0000 mL | INTRAMUSCULAR | Status: DC | PRN

## 2012-06-13 MED ORDER — SODIUM CHLORIDE 0.9 % IV SOLN
INTRAVENOUS | Status: DC
  Administered 2012-06-13: 08:00:00 via INTRAVENOUS

## 2012-06-13 MED ORDER — SODIUM CHLORIDE 0.9 % IV SOLN
INTRAVENOUS | Status: AC
  Administered 2012-06-13 (×2): via INTRAVENOUS

## 2012-06-13 NOTE — Progress Notes (Signed)
[facility]  DAILY PROGRESS NOTE   Subjective:  Creatinine improved to 2.3 today. Awaiting PV angiogram per Dr. Allyson Sabal. No further AICD shocks.  Objective:  Temp:  [97.5 F (36.4 C)-98 F (36.7 C)] 97.9 F (36.6 C) (07/12 0355) Pulse Rate:  [52-72] 53  (07/12 0355) Resp:  [16-20] 20  (07/12 0000) BP: (125-146)/(49-97) 146/49 mmHg (07/12 0355) SpO2:  [93 %-97 %] 94 % (07/12 0355) Weight:  [95 kg (209 lb 7 oz)] 95 kg (209 lb 7 oz) (07/12 0627) Weight change: 3.3 kg (7 lb 4.4 oz)  Intake/Output from previous day: 07/11 0701 - 07/12 0700 In: 2598 [P.O.:1037; I.V.:1511; IV Piggyback:50] Out: 2000 [Urine:2000]  Intake/Output from this shift:    Medications: Current Facility-Administered Medications  Medication Dose Route Frequency Provider Last Rate Last Dose  . 0.9 %  sodium chloride infusion   Intravenous Continuous Abelino Derrick, PA 75 mL/hr at 06/13/12 0700    . 0.9 %  sodium chloride infusion  250 mL Intravenous PRN Runell Gess, MD      . 0.9 %  sodium chloride infusion   Intravenous Continuous Runell Gess, MD 75 mL/hr at 06/13/12 0744    . acetaminophen (TYLENOL) tablet 650 mg  650 mg Oral Q6H PRN Nada Boozer, NP       Or  . acetaminophen (TYLENOL) suppository 650 mg  650 mg Rectal Q6H PRN Nada Boozer, NP      . allopurinol (ZYLOPRIM) tablet 100 mg  100 mg Oral Daily Nada Boozer, NP   100 mg at 06/12/12 0903  . alum & mag hydroxide-simeth (MAALOX/MYLANTA) 200-200-20 MG/5ML suspension 30 mL  30 mL Oral Q6H PRN Nada Boozer, NP      . carvedilol (COREG) tablet 12.5 mg  12.5 mg Oral BID WC Nada Boozer, NP   12.5 mg at 06/12/12 1736  . cefTRIAXone (ROCEPHIN) 1 g in dextrose 5 % 50 mL IVPB  1 g Intravenous Q24H Nada Boozer, NP   1 g at 06/12/12 1951  . docusate sodium (COLACE) capsule 100 mg  100 mg Oral BID Nada Boozer, NP   100 mg at 06/12/12 2112  . heparin ADULT infusion 100 units/mL (25000 units/250 mL)  1,400 Units/hr Intravenous  Continuous Colleen Can, PHARMD 14 mL/hr at 06/12/12 2000 1,400 Units/hr at 06/12/12 2000  . hydrALAZINE (APRESOLINE) tablet 25 mg  25 mg Oral TID Nada Boozer, NP   25 mg at 06/12/12 2111  . HYDROcodone-acetaminophen (NORCO) 5-325 MG per tablet 1-2 tablet  1-2 tablet Oral Q4H PRN Nada Boozer, NP   2 tablet at 06/13/12 0130  . insulin aspart (novoLOG) injection 0-15 Units  0-15 Units Subcutaneous TID WC Nada Boozer, NP   5 Units at 06/12/12 1735  . insulin aspart (novoLOG) injection 0-5 Units  0-5 Units Subcutaneous QHS Nada Boozer, NP   2 Units at 06/12/12 2223  . insulin glargine (LANTUS) injection 10 Units  10 Units Subcutaneous BID Eda Paschal [place], [place]   10 Units at 06/12/12 2223  . isosorbide dinitrate (ISORDIL) tablet 20 mg  20 mg Oral TID Nada Boozer, NP   20 mg at 06/12/12 2112  . levothyroxine (SYNTHROID, LEVOTHROID) tablet 50 mcg  50 mcg Oral QAC breakfast Nada Boozer, NP   50 mcg at 06/12/12 0902  . morphine 2 MG/ML injection 2 mg  2 mg Intravenous Q2H PRN Nada Boozer, NP      . ondansetron [facility]) tablet 4 mg  4 mg  Oral Q6H PRN Nada Boozer, NP       Or  . ondansetron [facility]) injection 4 mg  4 mg Intravenous Q6H PRN Nada Boozer, NP      . pantoprazole (PROTONIX) EC tablet 80 mg  80 mg Oral Q1200 Nada Boozer, NP   80 mg at 06/12/12 1224  . polyethylene glycol (MIRALAX / GLYCOLAX) packet 17 g  17 g Oral Daily PRN Nada Boozer, NP      . potassium chloride SA (K-DUR,KLOR-CON) CR tablet 40 mEq  40 mEq Oral Once Nada Boozer, NP   40 mEq at 06/12/12 0903  . simvastatin (ZOCOR) tablet 40 mg  40 mg Oral QHS Nada Boozer, NP   40 mg at 06/12/12 2112  . sodium chloride 0.9 % injection 3 mL  3 mL Intravenous PRN Runell Gess, MD      . zolpidem [facility]) tablet 5 mg  5 mg Oral QHS PRN Nada Boozer, NP      . DISCONTD: insulin glargine (LANTUS) injection 10 Units  10 Units Subcutaneous BID Nada Boozer, NP   10 Units at 06/12/12 0901  . DISCONTD: sodium chloride 0.9 % injection  3 mL  3 mL Intravenous Q12H Nada Boozer, NP        Physical Exam: General appearance: alert and no distress Neck: no adenopathy, no carotid bruit, no JVD, supple, symmetrical, trachea midline and thyroid not enlarged, symmetric, no tenderness/mass/nodules Lungs: clear to auscultation bilaterally Heart: regular rate and rhythm, S1, S2 normal, no murmur, click, rub or gallop Abdomen: soft, non-tender; bowel sounds normal; no masses,  no organomegaly Extremities: 2+ edema, weak pulses, multiple sores Pulses: weak pulses Skin: chronic LE stasis changes Neurologic: Mental status: slowed mentation, poor hearing.  Lab Results: Results for orders placed during the hospital encounter of 06/11/12 (from the past 48 hour(s))  MRSA PCR SCREENING     Status: Normal   Collection Time   06/11/12  4:33 PM      Component Value Range Comment   MRSA by PCR NEGATIVE  NEGATIVE   GLUCOSE, CAPILLARY     Status: Abnormal   Collection Time   06/11/12  4:49 PM      Component Value Range Comment   Glucose-Capillary 56 (*) 70 - 99 mg/dL   GLUCOSE, CAPILLARY     Status: Abnormal   Collection Time   06/11/12  5:10 PM      Component Value Range Comment   Glucose-Capillary 63 (*) 70 - 99 mg/dL   CBC     Status: Abnormal   Collection Time   06/11/12  5:44 PM      Component Value Range Comment   WBC 13.7 (*) 4.0 - 10.5 K/uL    RBC 3.98 (*) 4.22 - 5.81 MIL/uL    Hemoglobin 11.7 (*) 13.0 - 17.0 g/dL    HCT 09.8 (*) 11.9 - 52.0 %    MCV 87.9  78.0 - 100.0 fL    MCH 29.4  26.0 - 34.0 pg    MCHC 33.4  30.0 - 36.0 g/dL    RDW 14.7  82.9 - 56.2 %    Platelets 249  150 - 400 K/uL   COMPREHENSIVE METABOLIC PANEL     Status: Abnormal   Collection Time   06/11/12  5:44 PM      Component Value Range Comment   Sodium 140  135 - 145 mEq/L    Potassium 3.2 (*) 3.5 - 5.1 mEq/L    Chloride 96  96 - 112 mEq/L    CO2 28  19 - 32 mEq/L    Glucose, Bld 52 (*) 70 - 99 mg/dL    BUN 86 (*) 6 - 23 mg/dL    Creatinine, Ser  0.98 (*) 0.50 - 1.35 mg/dL    Calcium 11.9  8.4 - 10.5 mg/dL    Total Protein 7.6  6.0 - 8.3 g/dL    Albumin 3.2 (*) 3.5 - 5.2 g/dL    AST 21  0 - 37 U/L    ALT 6  0 - 53 U/L    Alkaline Phosphatase 106  39 - 117 U/L    Total Bilirubin 0.8  0.3 - 1.2 mg/dL    GFR calc non Af Amer 18 (*) >90 mL/min    GFR calc Af Amer 21 (*) >90 mL/min   MAGNESIUM     Status: Normal   Collection Time   06/11/12  5:44 PM      Component Value Range Comment   Magnesium 2.5  1.5 - 2.5 mg/dL   PHOSPHORUS     Status: Abnormal   Collection Time   06/11/12  5:44 PM      Component Value Range Comment   Phosphorus 2.2 (*) 2.3 - 4.6 mg/dL   DIFFERENTIAL     Status: Abnormal   Collection Time   06/11/12  5:44 PM      Component Value Range Comment   Neutrophils Relative 80 (*) 43 - 77 %    Neutro Abs 10.9 (*) 1.7 - 7.7 K/uL    Lymphocytes Relative 11 (*) 12 - 46 %    Lymphs Abs 1.5  0.7 - 4.0 K/uL    Monocytes Relative 8  3 - 12 %    Monocytes Absolute 1.2 (*) 0.1 - 1.0 K/uL    Eosinophils Relative 1  0 - 5 %    Eosinophils Absolute 0.1  0.0 - 0.7 K/uL    Basophils Relative 0  0 - 1 %    Basophils Absolute 0.0  0.0 - 0.1 K/uL   APTT     Status: Abnormal   Collection Time   06/11/12  5:44 PM      Component Value Range Comment   aPTT 41 (*) 24 - 37 seconds   PROTIME-INR     Status: Abnormal   Collection Time   06/11/12  5:44 PM      Component Value Range Comment   Prothrombin Time 21.9 (*) 11.6 - 15.2 seconds    INR 1.88 (*) 0.00 - 1.49   TSH     Status: Normal   Collection Time   06/11/12  5:44 PM      Component Value Range Comment   TSH 1.128  0.350 - 4.500 uIU/mL   HEMOGLOBIN A1C     Status: Abnormal   Collection Time   06/11/12  5:44 PM      Component Value Range Comment   Hemoglobin A1C 8.2 (*) <5.7 %    Mean Plasma Glucose 189 (*) <117 mg/dL   GLUCOSE, CAPILLARY     Status: Normal   Collection Time   06/11/12  6:05 PM      Component Value Range Comment   Glucose-Capillary 89  70 - 99 mg/dL     URINALYSIS, ROUTINE W REFLEX MICROSCOPIC     Status: Normal   Collection Time   06/11/12  7:18 PM      Component Value Range Comment   Color, Urine YELLOW  YELLOW    APPearance CLEAR  CLEAR    Specific Gravity, Urine 1.012  1.005 - 1.030    pH 5.5  5.0 - 8.0    Glucose, UA NEGATIVE  NEGATIVE mg/dL    Hgb urine dipstick NEGATIVE  NEGATIVE    Bilirubin Urine NEGATIVE  NEGATIVE    Ketones, ur NEGATIVE  NEGATIVE mg/dL    Protein, ur NEGATIVE  NEGATIVE mg/dL    Urobilinogen, UA 1.0  0.0 - 1.0 mg/dL    Nitrite NEGATIVE  NEGATIVE    Leukocytes, UA NEGATIVE  NEGATIVE MICROSCOPIC NOT DONE ON URINES WITH NEGATIVE PROTEIN, BLOOD, LEUKOCYTES, NITRITE, OR GLUCOSE <1000 mg/dL.  GLUCOSE, CAPILLARY     Status: Abnormal   Collection Time   06/11/12 10:03 PM      Component Value Range Comment   Glucose-Capillary 157 (*) 70 - 99 mg/dL   CBC     Status: Abnormal   Collection Time   06/12/12  4:00 AM      Component Value Range Comment   WBC 11.7 (*) 4.0 - 10.5 K/uL    RBC 3.49 (*) 4.22 - 5.81 MIL/uL    Hemoglobin 10.0 (*) 13.0 - 17.0 g/dL    HCT 16.1 (*) 09.6 - 52.0 %    MCV 88.5  78.0 - 100.0 fL    MCH 28.7  26.0 - 34.0 pg    MCHC 32.4  30.0 - 36.0 g/dL    RDW 04.5 (*) 40.9 - 15.5 %    Platelets 231  150 - 400 K/uL   BASIC METABOLIC PANEL     Status: Abnormal   Collection Time   06/12/12  4:10 AM      Component Value Range Comment   Sodium 136  135 - 145 mEq/L    Potassium 3.0 (*) 3.5 - 5.1 mEq/L    Chloride 96  96 - 112 mEq/L    CO2 27  19 - 32 mEq/L    Glucose, Bld 197 (*) 70 - 99 mg/dL    BUN 85 (*) 6 - 23 mg/dL    Creatinine, Ser 8.11 (*) 0.50 - 1.35 mg/dL    Calcium 9.2  8.4 - 91.4 mg/dL    GFR calc non Af Amer 19 (*) >90 mL/min    GFR calc Af Amer 22 (*) >90 mL/min   PROTIME-INR     Status: Abnormal   Collection Time   06/12/12  4:10 AM      Component Value Range Comment   Prothrombin Time 22.4 (*) 11.6 - 15.2 seconds    INR 1.93 (*) 0.00 - 1.49   HEPARIN LEVEL (UNFRACTIONATED)      Status: Abnormal   Collection Time   06/12/12  4:10 AM      Component Value Range Comment   Heparin Unfractionated <0.10 (*) 0.30 - 0.70 IU/mL   GLUCOSE, CAPILLARY     Status: Abnormal   Collection Time   06/12/12  8:03 AM      Component Value Range Comment   Glucose-Capillary 147 (*) 70 - 99 mg/dL   GLUCOSE, CAPILLARY     Status: Abnormal   Collection Time   06/12/12 11:21 AM      Component Value Range Comment   Glucose-Capillary 201 (*) 70 - 99 mg/dL   HEPARIN LEVEL (UNFRACTIONATED)     Status: Normal   Collection Time   06/12/12  1:00 PM      Component Value Range Comment   Heparin Unfractionated 0.50  0.30 -  0.70 IU/mL   POTASSIUM     Status: Normal   Collection Time   06/12/12  3:48 PM      Component Value Range Comment   Potassium 3.9  3.5 - 5.1 mEq/L   GLUCOSE, CAPILLARY     Status: Abnormal   Collection Time   06/12/12  4:30 PM      Component Value Range Comment   Glucose-Capillary 204 (*) 70 - 99 mg/dL   GLUCOSE, CAPILLARY     Status: Abnormal   Collection Time   06/12/12  9:24 PM      Component Value Range Comment   Glucose-Capillary 224 (*) 70 - 99 mg/dL   HEPARIN LEVEL (UNFRACTIONATED)     Status: Normal   Collection Time   06/13/12  5:30 AM      Component Value Range Comment   Heparin Unfractionated 0.52  0.30 - 0.70 IU/mL   CBC     Status: Abnormal   Collection Time   06/13/12  5:30 AM      Component Value Range Comment   WBC 10.3  4.0 - 10.5 K/uL    RBC 3.55 (*) 4.22 - 5.81 MIL/uL    Hemoglobin 10.1 (*) 13.0 - 17.0 g/dL    HCT 16.1 (*) 09.6 - 52.0 %    MCV 88.5  78.0 - 100.0 fL    MCH 28.5  26.0 - 34.0 pg    MCHC 32.2  30.0 - 36.0 g/dL    RDW 04.5 (*) 40.9 - 15.5 %    Platelets 237  150 - 400 K/uL   PROTIME-INR     Status: Abnormal   Collection Time   06/13/12  5:30 AM      Component Value Range Comment   Prothrombin Time 22.2 (*) 11.6 - 15.2 seconds    INR 1.91 (*) 0.00 - 1.49   BASIC METABOLIC PANEL     Status: Abnormal   Collection Time   06/13/12   5:30 AM      Component Value Range Comment   Sodium 136  135 - 145 mEq/L    Potassium 3.6  3.5 - 5.1 mEq/L    Chloride 99  96 - 112 mEq/L    CO2 24  19 - 32 mEq/L    Glucose, Bld 168 (*) 70 - 99 mg/dL    BUN 72 (*) 6 - 23 mg/dL    Creatinine, Ser 8.11 (*) 0.50 - 1.35 mg/dL    Calcium 9.3  8.4 - 91.4 mg/dL    GFR calc non Af Amer 26 (*) >90 mL/min    GFR calc Af Amer 30 (*) >90 mL/min   MAGNESIUM     Status: Normal   Collection Time   06/13/12  5:30 AM      Component Value Range Comment   Magnesium 2.3  1.5 - 2.5 mg/dL     Imaging: Portable Chest 1 View  06/11/2012  *RADIOLOGY REPORT*  Clinical Data: Heart failure  PORTABLE CHEST - 1 VIEW  Comparison: 11/09/2011  Findings: AICD remains in good position and unchanged.  Cardiac enlargement.  Pulmonary vascularity normal.  Negative for edema or effusion.  Negative for pneumonia  IMPRESSION: No active cardiopulmonary disease.  Original Report Authenticated By: Camelia Phenes, M.D.    Assessment:  1. Principal Problem: 2.  *ICD discharge in setting of K+ 3.0 3. Active Problems: 4.  Hx of CABG x 6 2003 5.  ICD (implantable cardiac defibrillator) in place 6.  Chronic a-fib  7.  Diabetes mellitus type 2, insulin dependent 8.  CKD (chronic kidney disease) stage 4, GFR 15-29 ml/min 9.  Positive Coombs test 10.  Anemia due to chronic illness 11.  HTN (hypertension) 12.  Hypokalemia 13.  Ischemic cardiomyopathy, EF 25% 2D 12/12 14.  PVD, pt scheduled for LLE PVA 06/13/12 15.  Dyslipidemia 16.  Chronic anticoagulation 17.  Non-sustained ventricular tachycardia 18.  Sleep apnea, C-Pap intol 19.  Diabetic foot infection, just discharged 05/30/12 20.  Gout 21.  Hypothyroidism 22.  Acute on chronic renal failure, SCr 2.3-3.3 secondary to dehydration 23.  Chronic systolic CHF (congestive heart failure) 24.   Plan:  1. No complaints currently other than leg pain. Serum creatinine is 2.3 today. .. Baseline may be around 2. Will defer to  Dr. Allyson Sabal as to whether he wishes to perform PV angiogram today.  INR still 1.9.  Time Spent Directly with Patient:  15 minutes  Length of Stay:  LOS: 2 days   Chrystie Nose, MD, [facility] Attending Cardiologist [facility]  HILTY,Kenneth C 06/13/2012, 7:57 AM

## 2012-06-13 NOTE — H&P (Signed)
    Pt was reexamined and existing H & P reviewed. No changes found.  Runell Gess, MD [facility] 06/13/2012 2:52 PM

## 2012-06-13 NOTE — Progress Notes (Signed)
ANTICOAGULATION CONSULT NOTE - Follow Up Consult  Pharmacy Consult for Heparin Indication: atrial fibrillation  No Known Allergies  Patient Measurements: Height: 6\' 2"  (188 cm) Weight: 209 lb 7 oz (95 kg) IBW/kg (Calculated) : 82.2  Heparin Dosing Weight: 95kg  Vital Signs: Temp: 97.6 F (36.4 C) (07/12 1151) Temp src: Oral (07/12 1151) BP: 124/54 mmHg (07/12 1149) Pulse Rate: 57  (07/12 1151)  Labs:  Basename 06/13/12 1152 06/13/12 0530 06/12/12 1300 06/12/12 0410 06/12/12 0400 06/11/12 1744  HGB -- 10.1* -- -- 10.0* --  HCT -- 31.4* -- -- 30.9* 35.0*  PLT -- 237 -- -- 231 249  APTT -- -- -- -- -- 41*  LABPROT 21.7* 22.2* -- 22.4* -- --  INR 1.85* 1.91* -- 1.93* -- --  HEPARINUNFRC -- 0.52 0.50 <0.10* -- --  CREATININE -- 2.30* -- 2.97* -- 3.13*  CKTOTAL -- -- -- -- -- --  CKMB -- -- -- -- -- --  TROPONINI -- -- -- -- -- --    Estimated Creatinine Clearance: 32.8 ml/min (by C-G formula based on Cr of 2.3).  Assessment: 76yom s/p LLE angiogram to restart heparin for Afib while INR is subtherapeutic. Per MD order, restart heparin drip 6 hours post sheath pull with no bolus. Sheath was pulled ~1720 per RN. Patient was previously therapeutic (x2) on heparin 1400 units/hr - will restart at same rate - H/H and Plts stable - No bleeding/hematoma per RN  Goal of Therapy:  Heparin level 0.3-0.7 units/ml Monitor platelets by anticoagulation protocol: Yes   Plan:  1. Restart heparin 1400 units/hr (14 ml/hr) @ 2330 tonight 2. Follow-up daily heparin level and CBC 3. Monitor for signs/symptoms of bleeding  Cleon Dew [phone] 06/13/2012,5:56 PM

## 2012-06-13 NOTE — Progress Notes (Signed)
ANTICOAGULATION CONSULT NOTE - Follow Up Consult  Pharmacy Consult for heparin Indication: atrial fibrillation  Labs:  Basename 06/13/12 0530 06/12/12 1300 06/12/12 0410 06/12/12 0400 06/11/12 1744  HGB 10.1* -- -- 10.0* --  HCT 31.4* -- -- 30.9* 35.0*  PLT 237 -- -- 231 249  APTT -- -- -- -- 41*  LABPROT 22.2* -- 22.4* -- 21.9*  INR 1.91* -- 1.93* -- 1.88*  HEPARINUNFRC 0.52 0.50 <0.10* -- --  CREATININE 2.30* -- 2.97* -- 3.13*  CKTOTAL -- -- -- -- --  CKMB -- -- -- -- --  TROPONINI -- -- -- -- --    Assessment: 77yo male on heparin for Afib while Coumadin on hold. Heparin level = 0.52 on rate of 1400 units/hr = therapeutic. H/H and pltc stable. No bleeding reported.  May have LLE PV angiogram today to w/u ischemic foot. INR 1.91.  Goal of Therapy:  Heparin level 0.3-0.7 units/ml   Plan:  Continue heparin at 1400 units/hr  Daily HL and CBC Herby Abraham, Pharm.D. [phone] 06/13/2012 10:18 AM

## 2012-06-13 NOTE — Op Note (Signed)
[name] is a 77 y.o. male    [id number] LOCATION:  FACILITY: [facility]  PHYSICIAN: Nanetta Batty, M.D. 06-20-1935   DATE OF PROCEDURE:  06/13/2012  DATE OF DISCHARGE:  [facility]  CARDIAC CATHETERIZATION     History obtained from chart review. Mr. Rabbani is a 77 year old male patient of Dr. 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 Cornelius Moras in 2003, insulin-dependent diabetes, chronic atrial fibrillation on Coumadin anticoagulation, hyperlipidemia, and chronic renal insufficiency. He saw Dr. Alanda Amass in the office on July 1 with a left heel ulcer. His Dopplers revealed a diminished left ABI. He was admitted for hydration, and left lower extremity angiography with potential percutaneous intervention for critical limb ischemia/limb salvage.   PROCEDURE DESCRIPTION:    The patient was brought to the second floor  [place] [facility]iac cath lab in the postabsorptive state. He was  premedicated with Valium 5 mg by mouth. His right groin was prepped and shaved in usual sterile fashion. Xylocaine 1% was used  for local anesthesia. A 5 French sheath was inserted into the right common femoral  artery using standard Seldinger technique.a 5 [place] crossover catheter, and endhole catheter was used for left lower extremity angiography using bolus chase, digital subtraction, step table technique.  Visipaque dye was used for the entirety of the case. Retrograde aortic pressure was monitored during the case.   HEMODYNAMICS:    AO SYSTOLIC/AO DIASTOLIC: 157/79     ANGIOGRAPHIC RESULTS:   1: Left lower extremity-50-70% proximal left SFA stenosis. The below-knee popliteal a subtotally occluded. The tibial peroneal trunk and anterior tib and peroneal were occluded as well the reconstituted. I never saw the posterior tibial.  IMPRESSION:Mr. Kaley has a subtotally occluded  below the knee popliteal,  Anterior tibial and tibioperoneal trunk with critical limb ischemia and the ischemic left heel ulcer. A total of 85 cc of contrast was administered the patient. The sheath was removed and pressure was held the groin to achieve hemostasis. He'll be hydrated and his renal function will be followed. Heparin will be restarted because of his chronic A. Fib. Plans will be to perform percutaneous rest position of his below the knee popliteal to be pale trunk were to provide potential blood flow for healing.  Runell Gess MD, [facility] 06/13/2012 3:38 PM

## 2012-06-14 LAB — HEPARIN LEVEL (UNFRACTIONATED): Heparin Unfractionated: 0.42 IU/mL (ref 0.30–0.70)

## 2012-06-14 LAB — BASIC METABOLIC PANEL
BUN: 52 mg/dL — ABNORMAL HIGH (ref 6–23)
CO2: 26 mEq/L (ref 19–32)
Chloride: 102 mEq/L (ref 96–112)
GFR calc Af Amer: 41 mL/min — ABNORMAL LOW (ref >90)
Glucose, Bld: 147 mg/dL — ABNORMAL HIGH (ref 70–99)
Potassium: 3.9 mEq/L (ref 3.5–5.1)

## 2012-06-14 LAB — CBC
HCT: 35.4 % — ABNORMAL LOW (ref 39.0–52.0)
Hemoglobin: 11.3 g/dL — ABNORMAL LOW (ref 13.0–17.0)
MCH: 28.8 pg (ref 26.0–34.0)
MCV: 90.3 fL (ref 78.0–100.0)
RBC: 3.92 MIL/uL — ABNORMAL LOW (ref 4.22–5.81)

## 2012-06-14 LAB — GLUCOSE, CAPILLARY
Glucose-Capillary: 186 mg/dL — ABNORMAL HIGH (ref 70–99)
Glucose-Capillary: 137 mg/dL — ABNORMAL HIGH (ref 70–99)

## 2012-06-14 LAB — PROTIME-INR: Prothrombin Time: 20.9 seconds — ABNORMAL HIGH (ref 11.6–15.2)

## 2012-06-14 MED ORDER — POTASSIUM CHLORIDE CRYS ER 20 MEQ PO TBCR
40.0000 meq | EXTENDED_RELEASE_TABLET | Freq: Every day | ORAL | Status: DC
  Administered 2012-06-14 – 2012-06-15 (×2): 40 meq via ORAL
  Filled 2012-06-14 (×3): qty 2

## 2012-06-14 MED ORDER — CARVEDILOL 25 MG PO TABS
25.0000 mg | ORAL_TABLET | Freq: Two times a day (BID) | ORAL | Status: DC
  Administered 2012-06-14 – 2012-06-18 (×7): 25 mg via ORAL
  Filled 2012-06-14 (×11): qty 1

## 2012-06-14 MED ORDER — SODIUM CHLORIDE 0.9 % IV SOLN
INTRAVENOUS | Status: DC
  Administered 2012-06-14: 22:00:00 via INTRAVENOUS

## 2012-06-14 MED ORDER — CARVEDILOL 25 MG PO TABS
25.0000 mg | ORAL_TABLET | Freq: Two times a day (BID) | ORAL | Status: DC
  Filled 2012-06-14 (×2): qty 1

## 2012-06-14 MED ORDER — HEPARIN BOLUS VIA INFUSION
2000.0000 [IU] | Freq: Once | INTRAVENOUS | Status: AC
  Administered 2012-06-14: 2000 [IU] via INTRAVENOUS
  Filled 2012-06-14: qty 2000

## 2012-06-14 NOTE — Progress Notes (Signed)
Subjective:  No complaints.  Objective:  Vital Signs in the last 24 hours: Temp:  [97.6 F (36.4 C)-100 F (37.8 C)] 99.3 F (37.4 C) (07/13 0751) Pulse Rate:  [57-92] 71  (07/13 0420) Resp:  [15-20] 16  (07/13 0751) BP: (124-157)/(51-71) 146/51 mmHg (07/13 0420) SpO2:  [95 %-98 %] 96 % (07/13 0420)  Intake/Output from previous day:  Intake/Output Summary (Last 24 hours) at 06/14/12 0832 Last data filed at 06/14/12 0700  Gross per 24 hour  Intake 1755.03 ml  Output   1250 ml  Net 505.03 ml    Physical Exam: General appearance: alert, cooperative and no distress Lungs: clear to auscultation bilaterally Heart: irregularly irregular rhythm Rt groin without hematoma or echymosis   Rate: 78  Rhythm: atrial fibrillation and Pacing on demand, 12 beats slow, 110, NSVT  Lab Results:  Basename 06/13/12 0530 06/12/12 0400  WBC 10.3 11.7*  HGB 10.1* 10.0*  PLT 237 231    Basename 06/13/12 0530 06/12/12 1548 06/12/12 0410  NA 136 -- 136  K 3.6 3.9 --  CL 99 -- 96  CO2 24 -- 27  GLUCOSE 168* -- 197*  BUN 72* -- 85*  CREATININE 2.30* -- 2.97*   No results found for this basename: TROPONINI:2,CK,MB:2 in the last 72 hours Hepatic Function Panel  Basename 06/11/12 1744  PROT 7.6  ALBUMIN 3.2*  AST 21  ALT 6  ALKPHOS 106  BILITOT 0.8  BILIDIR --  IBILI --   No results found for this basename: CHOL in the last 72 hours  Basename 06/13/12 1152  INR 1.85*    Imaging: Imaging results have been reviewed  Cardiac Studies:  Assessment/Plan:   Principal Problem:  *ICD discharge in setting of K+ 3.0 Active Problems:  Acute on chronic renal failure, SCr 2.3-3.3 secondary to dehydration  CKD (chronic kidney disease) stage 4, GFR 15-29 ml/min  Hypokalemia  Ischemic cardiomyopathy, EF 25% 2D 12/12  PVD, pt scheduled for LLE PVA 06/13/12  Chronic anticoagulation  Non-sustained ventricular tachycardia  Diabetic foot infection, just discharged 05/30/12  Chronic  systolic CHF (congestive heart failure)  Hx of CABG x 6 2003  ICD (implantable cardiac defibrillator) in place  Chronic a-fib  Diabetes mellitus type 2, insulin dependent  Positive Coombs test  Anemia due to chronic illness  HTN (hypertension)  Dyslipidemia  Sleep apnea, C-Pap intol  Gout  Hypothyroidism  Plan- BMP pending. PTA Wednesday. Increase Coreg for NSVT.    Corine Shelter PA-C 06/14/2012, 8:32 AM

## 2012-06-14 NOTE — Progress Notes (Signed)
ANTICOAGULATION CONSULT NOTE - Follow Up Consult  Pharmacy Consult for Heparin Indication: atrial fibrillation  No Known Allergies  Patient Measurements: Height: 6\' 2"  (188 cm) Weight: 209 lb 7 oz (95 kg) IBW/kg (Calculated) : 82.2  Heparin Dosing Weight: 95kg  Vital Signs: Temp: 99.3 F (37.4 C) (07/13 0751) Temp src: Oral (07/13 0751) BP: 146/51 mmHg (07/13 0420) Pulse Rate: 71  (07/13 0420)  Labs:  Basename 06/14/12 0829 06/13/12 1152 06/13/12 0530 06/12/12 1300 06/12/12 0410 06/12/12 0400 06/11/12 1744  HGB 11.3* -- 10.1* -- -- -- --  HCT 35.4* -- 31.4* -- -- 30.9* --  PLT 250 -- 237 -- -- 231 --  APTT -- -- -- -- -- -- 41*  LABPROT 20.9* 21.7* 22.2* -- -- -- --  INR 1.77* 1.85* 1.91* -- -- -- --  HEPARINUNFRC 0.13* -- 0.52 0.50 -- -- --  CREATININE 1.81* -- 2.30* -- 2.97* -- --  CKTOTAL -- -- -- -- -- -- --  CKMB -- -- -- -- -- -- --  TROPONINI -- -- -- -- -- -- --    Estimated Creatinine Clearance: 41.6 ml/min (by C-G formula based on Cr of 1.81).  Assessment: 76yom s/p LLE angiogram yesterday and  heparin restarted for Afib at midnight.  AM heparin level low at 0.13.  Perhaps the coumadin effect is wearing off as the INR is 1.77.  H/H and platelet count stable.  Groin OK.  To have peripheral intervention by Dr. Allyson Sabal Tuesday.   Goal of Therapy:  Heparin level 0.3-0.7 units/ml Monitor platelets by anticoagulation protocol: Yes   Plan:  1.heparin 2000 units bolus 2. Increase drip to 1600 units/hr (164 ml/hr)3. 3. 8 hr heparin level 4.  Follow-up daily heparin level and CBC 5. Monitor for signs/symptoms of bleeding  Herby Abraham, Pharm.D. [phone] 06/14/2012 10:31 AM

## 2012-06-14 NOTE — Progress Notes (Signed)
ANTICOAGULATION CONSULT NOTE - Follow Up Consult  Pharmacy Consult for Heparin Indication: atrial fibrillation  No Known Allergies  Patient Measurements: Height: 6\' 2"  (188 cm) Weight: 209 lb 7 oz (95 kg) IBW/kg (Calculated) : 82.2  Heparin Dosing Weight: 95kg  Vital Signs: Temp: 98.5 F (36.9 C) (07/13 2000) Temp src: Oral (07/13 2000) BP: 121/45 mmHg (07/13 2000) Pulse Rate: 52  (07/13 2000)  Labs:  Basename 06/14/12 2008 06/14/12 0829 06/13/12 1152 06/13/12 0530 06/12/12 0410 06/12/12 0400  HGB -- 11.3* -- 10.1* -- --  HCT -- 35.4* -- 31.4* -- 30.9*  PLT -- 250 -- 237 -- 231  APTT -- -- -- -- -- --  LABPROT -- 20.9* 21.7* 22.2* -- --  INR -- 1.77* 1.85* 1.91* -- --  HEPARINUNFRC 0.42 0.13* -- 0.52 -- --  CREATININE -- 1.81* -- 2.30* 2.97* --  CKTOTAL -- -- -- -- -- --  CKMB -- -- -- -- -- --  TROPONINI -- -- -- -- -- --    Estimated Creatinine Clearance: 41.6 ml/min (by C-G formula based on Cr of 1.81).  Assessment: 76yom s/p LLE angiogram yesterday, on heparin for Afib.  Heparin level (0.42) therapeutic after rate increase this AM. No bleeding per chart.  Goal of Therapy:  Heparin level 0.3-0.7 units/ml Monitor platelets by anticoagulation protocol: Yes   Plan:  - Continue heparin infusion at 1600 units/hr (16 ml/hr). - Follow-up daily heparin level and CBC - Monitor for signs/symptoms of bleeding  Bayard Hugger, PharmD, BCPS  Clinical Pharmacist  Pager: [phone]  06/14/2012 8:55 PM

## 2012-06-14 NOTE — Progress Notes (Signed)
Pt. Seen and examined. Agree with the NP/PA-C note as written.  Slow VT noted overnight at ~120 bpm, non-sustained. He was unaware of it. K+ is 3.6.  Will give supplemental K+ (noting his CKD) to keep K+ around 4-4.5. Increased carvedilol to 25 mg BID. Plan for peripheral intervention Tuesday with Dr. Allyson Sabal.  Chrystie Nose, MD, [facility] Attending Cardiologist [facility]

## 2012-06-15 DIAGNOSIS — D649 Anemia, unspecified: Secondary | ICD-10-CM | POA: Diagnosis not present

## 2012-06-15 LAB — GLUCOSE, CAPILLARY
Glucose-Capillary: 222 mg/dL — ABNORMAL HIGH (ref 70–99)
Glucose-Capillary: 227 mg/dL — ABNORMAL HIGH (ref 70–99)

## 2012-06-15 LAB — BASIC METABOLIC PANEL
Calcium: 9.4 mg/dL (ref 8.4–10.5)
Chloride: 103 mEq/L (ref 96–112)
Creatinine, Ser: 1.82 mg/dL — ABNORMAL HIGH (ref 0.50–1.35)
GFR calc Af Amer: 40 mL/min — ABNORMAL LOW (ref >90)

## 2012-06-15 LAB — HEPARIN LEVEL (UNFRACTIONATED): Heparin Unfractionated: 0.4 IU/mL (ref 0.30–0.70)

## 2012-06-15 LAB — CBC
MCH: 28.7 pg (ref 26.0–34.0)
MCV: 88.9 fL (ref 78.0–100.0)
Platelets: 222 10*3/uL (ref 150–400)
RDW: 16.1 % — ABNORMAL HIGH (ref 11.5–15.5)
WBC: 10.1 10*3/uL (ref 4.0–10.5)

## 2012-06-15 NOTE — Progress Notes (Signed)
.   Subjective:  No chest pain or SOB  Objective:  Vital Signs in the last 24 hours: Temp:  [97.9 F (36.6 C)-98.5 F (36.9 C)] 98.5 F (36.9 C) (07/14 0330) Pulse Rate:  [48-68] 52  (07/14 0330) Resp:  [14-17] 14  (07/13 2330) BP: (121-147)/(45-64) 124/48 mmHg (07/14 0330) SpO2:  [96 %] 96 % (07/14 0330) Weight:  [95.8 kg (211 lb 3.2 oz)] 95.8 kg (211 lb 3.2 oz) (07/14 0330)  Intake/Output from previous day:  Intake/Output Summary (Last 24 hours) at 06/15/12 0751 Last data filed at 06/15/12 0700  Gross per 24 hour  Intake   1388 ml  Output   1475 ml  Net    -87 ml    Physical Exam: General appearance: alert, cooperative and no distress Lungs: clear to auscultation bilaterally Heart: irregularly irregular rhythm   Rate: 80  Rhythm: atrial fibrillation  Lab Results:  Basename 06/15/12 0349 06/14/12 0829  WBC 10.1 11.2*  HGB 9.6* 11.3*  PLT 222 250    Basename 06/15/12 0349 06/14/12 0829  NA 138 141  K 3.9 3.9  CL 103 102  CO2 24 26  GLUCOSE 116* 147*  BUN 50* 52*  CREATININE 1.82* 1.81*   No results found for this basename: TROPONINI:2,CK,MB:2 in the last 72 hours Hepatic Function Panel No results found for this basename: PROT,ALBUMIN,AST,ALT,ALKPHOS,BILITOT,BILIDIR,IBILI in the last 72 hours No results found for this basename: CHOL in the last 72 hours  Basename 06/14/12 0829  INR 1.77*    Imaging: Imaging results have been reviewed  Cardiac Studies:  Assessment/Plan:   Principal Problem:  *ICD discharge in setting of K+ 3.0 Active Problems:  Acute on chronic renal failure, SCr 2.3-3.3 secondary to dehydration  CKD (chronic kidney disease) stage 4, GFR 15-29 ml/min  Hypokalemia  Ischemic cardiomyopathy, EF 25% 2D 12/12  PVD, LLE PVA 06/13/12- for LLE PTA 7/16  Chronic anticoagulation  Non-sustained ventricular tachycardia  Diabetic foot infection, just discharged 05/30/12  Chronic systolic CHF (congestive heart failure)  Anemia, Hgb 11- 9.0  with hydration  Hx of CABG x 6 2003  ICD (implantable cardiac defibrillator) in place  Chronic a-fib  Diabetes mellitus type 2, insulin dependent  Positive Coombs test  Anemia due to chronic illness  HTN (hypertension)  Dyslipidemia  Sleep apnea, C-Pap intol  Gout  Hypothyroidism  Plan- PTA Tuesday. Tx to telemetry. His Lasix and Zaroxolyn have been on hold. We will probably need to resume a diuretic at some point. His Hgb has come done with hydration. Will check stools. Coreg increased yesterday for NSVT. Follow K+ daily as he is on K+ supplement.    Corine Shelter PA-C 06/15/2012, 7:51 AM

## 2012-06-15 NOTE — Progress Notes (Signed)
Pt. Seen and examined. Agree with the NP/PA-C note as written.  No further NSVT overnight. K+ unchanged despite supplementation. Creatinine has hit a nadir, possibly baseline. Plan for peripheral intervention on Tuesday per Dr. Allyson Sabal.  Chrystie Nose, MD, [facility] [place] [facility] Attending Cardiologist [facility]

## 2012-06-15 NOTE — Progress Notes (Signed)
ANTICOAGULATION CONSULT NOTE - Follow Up Consult  Pharmacy Consult for Heparin Indication: atrial fibrillation  No Known Allergies  Patient Measurements: Height: 6\' 2"  (188 cm) Weight: 211 lb 3.2 oz (95.8 kg) IBW/kg (Calculated) : 82.2  Heparin Dosing Weight: 95kg  Vital Signs: Temp: 98.5 F (36.9 C) (07/14 0330) Temp src: Oral (07/14 0330) BP: 124/48 mmHg (07/14 0330) Pulse Rate: 52  (07/14 0330)  Labs:  Basename 06/15/12 0349 06/14/12 2008 06/14/12 0829 06/13/12 1152 06/13/12 0530  HGB 9.6* -- 11.3* -- --  HCT 29.7* -- 35.4* -- 31.4*  PLT 222 -- 250 -- 237  APTT -- -- -- -- --  LABPROT -- -- 20.9* 21.7* 22.2*  INR -- -- 1.77* 1.85* 1.91*  HEPARINUNFRC 0.40 0.42 0.13* -- --  CREATININE 1.82* -- 1.81* -- 2.30*  CKTOTAL -- -- -- -- --  CKMB -- -- -- -- --  TROPONINI -- -- -- -- --    Estimated Creatinine Clearance: 41.4 ml/min (by C-G formula based on Cr of 1.82).  Assessment: 76yom s/p LLE angiogram Friday 7/12 on heparin bridge therapy for Afib.  AM heparin level is therapeutic at 0.40.  H/H down some to 9.6/29.7 from 11.3/35.4  and platelet count stable.  Groin OK. No bleeding reported. To have peripheral intervention by Dr. Allyson Sabal Tuesday.   Goal of Therapy:  Heparin level 0.3-0.7 units/ml Monitor platelets by anticoagulation protocol: Yes   Plan:   -continue heparin drip at 1600 units/hr  - Follow-up daily heparin level and CBC -Monitor for signs/symptoms of bleeding  Herby Abraham, Pharm.D. [phone] 06/15/2012 7:55 AM

## 2012-06-16 LAB — BASIC METABOLIC PANEL
Calcium: 9.5 mg/dL (ref 8.4–10.5)
GFR calc non Af Amer: 37 mL/min — ABNORMAL LOW (ref >90)
Glucose, Bld: 159 mg/dL — ABNORMAL HIGH (ref 70–99)
Sodium: 138 mEq/L (ref 135–145)

## 2012-06-16 LAB — CBC
Hemoglobin: 10.1 g/dL — ABNORMAL LOW (ref 13.0–17.0)
MCH: 28.7 pg (ref 26.0–34.0)
MCHC: 31.8 g/dL (ref 30.0–36.0)

## 2012-06-16 LAB — GLUCOSE, CAPILLARY: Glucose-Capillary: 189 mg/dL — ABNORMAL HIGH (ref 70–99)

## 2012-06-16 LAB — HEPARIN LEVEL (UNFRACTIONATED): Heparin Unfractionated: 0.48 IU/mL (ref 0.30–0.70)

## 2012-06-16 MED ORDER — SODIUM CHLORIDE 0.9 % IV SOLN: 250.0000 mL | INTRAVENOUS | Status: DC | PRN

## 2012-06-16 MED ORDER — SODIUM CHLORIDE 0.9 % IJ SOLN: 3.0000 mL | Freq: Two times a day (BID) | INTRAMUSCULAR | Status: DC

## 2012-06-16 MED ORDER — DIAZEPAM 5 MG PO TABS
5.0000 mg | ORAL_TABLET | ORAL | Status: AC
  Administered 2012-06-17: 5 mg via ORAL
  Filled 2012-06-16: qty 1

## 2012-06-16 MED ORDER — ASPIRIN 81 MG PO CHEW
324.0000 mg | CHEWABLE_TABLET | ORAL | Status: AC
  Administered 2012-06-17: 324 mg via ORAL
  Filled 2012-06-16: qty 4

## 2012-06-16 MED ORDER — SODIUM CHLORIDE 0.45 % IV SOLN
INTRAVENOUS | Status: DC
  Administered 2012-06-17: 06:00:00 via INTRAVENOUS

## 2012-06-16 MED ORDER — SODIUM CHLORIDE 0.9 % IJ SOLN: 3.0000 mL | INTRAMUSCULAR | Status: DC | PRN

## 2012-06-16 NOTE — Progress Notes (Signed)
ANTICOAGULATION CONSULT NOTE - Follow Up Consult  Pharmacy Consult for Heparin Indication: atrial fibrillation  No Known Allergies  Vital Signs: Temp: 98.3 F (36.8 C) (07/15 0910) Temp src: Oral (07/15 0910) BP: 152/65 mmHg (07/15 0910) Pulse Rate: 65  (07/15 0910)  Labs:  Basename 06/16/12 0500 06/15/12 0349 06/14/12 2008 06/14/12 0829 06/13/12 1152  HGB 10.1* 9.6* -- -- --  HCT 31.8* 29.7* -- 35.4* --  PLT 210 222 -- 250 --  APTT -- -- -- -- --  LABPROT -- -- -- 20.9* 21.7*  INR -- -- -- 1.77* 1.85*  HEPARINUNFRC 0.48 0.40 0.42 -- --  CREATININE 1.75* 1.82* -- 1.81* --  CKTOTAL -- -- -- -- --  CKMB -- -- -- -- --  TROPONINI -- -- -- -- --    Estimated Creatinine Clearance: 43.1 ml/min (by C-G formula based on Cr of 1.75).   Medications:  Heparin @ 1600 units/hr  Assessment: 76yom s/p LLE angiogram 7/12 continues on heparin bridge (coumadin on hold) awaiting staged revascularization tomorrow. Heparin level is therapeutic. CBC stable. Serum creatnine improving. No bleeding noted.  Goal of Therapy:  Heparin level 0.3-0.7 units/ml Monitor platelets by anticoagulation protocol: Yes   Plan:  1) Continue heparin at 1600 units/hr 2) Follow up heparin level and CBC in AM  Fredrik Rigger 06/16/2012,11:01 AM

## 2012-06-16 NOTE — Progress Notes (Addendum)
Subjective:  No SOB  Objective:  Vital Signs in the last 24 hours: Temp:  [98.3 F (36.8 C)-98.5 F (36.9 C)] 98.3 F (36.8 C) (07/15 0537) Pulse Rate:  [56-68] 59  (07/15 0537) Resp:  [17-19] 19  (07/15 0537) BP: (134-162)/(61-71) 157/71 mmHg (07/15 0537) SpO2:  [96 %-99 %] 99 % (07/15 0537) Weight:  [95.9 kg (211 lb 6.7 oz)] 95.9 kg (211 lb 6.7 oz) (07/15 0537)  Intake/Output from previous day:  Intake/Output Summary (Last 24 hours) at 06/16/12 0903 Last data filed at 06/16/12 0800  Gross per 24 hour  Intake    888 ml  Output   1150 ml  Net   -262 ml    Physical Exam: General appearance: alert, cooperative and no distress Lungs: clear to auscultation bilaterally Heart: irregularly irregular rhythm    Rate: 70  Rhythm: sinus arrhythmia and pacing on demand  Lab Results:  Basename 06/16/12 0500 06/15/12 0349  WBC 9.1 10.1  HGB 10.1* 9.6*  PLT 210 222    Basename 06/16/12 0500 06/15/12 0349  NA 138 138  K 4.5 3.9  CL 104 103  CO2 23 24  GLUCOSE 159* 116*  BUN 43* 50*  CREATININE 1.75* 1.82*   No results found for this basename: TROPONINI:2,CK,MB:2 in the last 72 hours Hepatic Function Panel No results found for this basename: PROT,ALBUMIN,AST,ALT,ALKPHOS,BILITOT,BILIDIR,IBILI in the last 72 hours No results found for this basename: CHOL in the last 72 hours  Basename 06/14/12 0829  INR 1.77*    Imaging: Imaging results have been reviewed  Cardiac Studies:  Assessment/Plan:   Principal Problem:  *ICD discharge in setting of K+ 3.0 Active Problems:  Acute on chronic renal failure, SCr 2.3-3.3 secondary to dehydration  CKD (chronic kidney disease) stage 4, GFR 15-29 ml/min  Hypokalemia  Ischemic cardiomyopathy, EF 25% 2D 12/12  PVD, LLE PVA 06/13/12- for LLE PTA 7/16  Chronic anticoagulation  Non-sustained ventricular tachycardia  Diabetic foot infection, just discharged 05/30/12  Chronic systolic CHF (congestive heart failure)  Anemia, Hgb 11-  9.0 with hydration  Hx of CABG x 6 2003  ICD (implantable cardiac defibrillator) in place  Chronic a-fib  Diabetes mellitus type 2, insulin dependent  Positive Coombs test  Anemia due to chronic illness  HTN (hypertension)  Dyslipidemia  Sleep apnea, C-Pap intol  Gout  Hypothyroidism   Plan-  His SCr is down to 1.7- he has been off diuretics since admission and seems to be tolerating it. He is for LE PTA in am. Will stop K+  Corine Shelter PA-C 06/16/2012, 9:03 AM  ATTENDING ATTESTATION:  I have seen and examined the patient along with Corine Shelter, PA.  I have reviewed the chart, notes and new data.  I agree with Luke's note.  Brief Description: 77 y/o man with severe ICM - s/p CABG & ICD (with recent shock last week), chronic Afib (on warfarin), admitted when found to be dehydrated with Acute on Chronic Renal failure (CKDIV) & Hypokalemic as part of pre-cath (for PV cath for non healing L foot diabetic ulcer / critical limb ischemia) evaluation.  He has responded well to hydration with significant improvement of rena function & hypokalemia has resolved. Diagnostic LEA angio 7/12: demonstrated L infrapopliteal subtotal occlusion with serve Ant Tibial & TP trunk disease -- plan for staged revascularization tomorrow with Dr. Dorma Russell.  He did not notice his ICD firing & has not noticed slow VT o/n Saturday.  No further VT with Coreg dose increased.  He  remains somewhat hypertensive - but with BB just increased, will hold off on increasing hydralazine dose for now.  Careful, gentle pre-cath hydration overnight.  He is not showing signs of CHF exacerbation, but does have some mild basal rales.  Will likely need to restart his home Diuretic prior to d/c.  Continue IV Abx for wound healing  On ASA & statin for CAD/PAD; Not on ACE-I/ARB due to [facility] Renal failure -- on Hydralazine/Nitrate for afterload reduction.  IV Heparin for Afib - rate control with BB.  Warfarin on hold for procedure.  Will  need to restart post cath +/- Heparin bridge (would probably bridge as he will likely need continued IV ABx post procedure).  Lantus bid + SSI for DM control - has not required excessive SSI correction.    Synthroid @ home dose.  Hgb stable.  Marykay Lex, M.D., M.S. [facility] [address] [place], [place]  [address]  [phone]  06/16/2012 10:10 AM

## 2012-06-17 ENCOUNTER — Encounter (HOSPITAL_COMMUNITY)
Admission: AD | Disposition: A | Discharge status: Home / Self Care | Payer: Self-pay | Source: Ambulatory Visit | Attending: Cardiovascular Disease

## 2012-06-17 HISTORY — PX: ATHERECTOMY: SHX5502

## 2012-06-17 LAB — CBC
MCHC: 31.8 g/dL (ref 30.0–36.0)
RDW: 16.3 % — ABNORMAL HIGH (ref 11.5–15.5)

## 2012-06-17 LAB — HEPARIN LEVEL (UNFRACTIONATED): Heparin Unfractionated: 0.22 IU/mL — ABNORMAL LOW (ref 0.30–0.70)

## 2012-06-17 LAB — GLUCOSE, CAPILLARY: Glucose-Capillary: 99 mg/dL (ref 70–99)

## 2012-06-17 LAB — BASIC METABOLIC PANEL
BUN: 39 mg/dL — ABNORMAL HIGH (ref 6–23)
GFR calc Af Amer: 44 mL/min — ABNORMAL LOW (ref >90)
GFR calc non Af Amer: 38 mL/min — ABNORMAL LOW (ref >90)
Potassium: 4.6 mEq/L (ref 3.5–5.1)
Sodium: 134 mEq/L — ABNORMAL LOW (ref 135–145)

## 2012-06-17 LAB — POCT ACTIVATED CLOTTING TIME: Activated Clotting Time: 209 seconds

## 2012-06-17 LAB — PROTIME-INR
INR: 1.36 (ref 0.00–1.49)
Prothrombin Time: 17 seconds — ABNORMAL HIGH (ref 11.6–15.2)

## 2012-06-17 SURGERY — ATHERECTOMY
Anesthesia: LOCAL

## 2012-06-17 MED ORDER — HEPARIN SODIUM (PORCINE) 1000 UNIT/ML IJ SOLN
INTRAMUSCULAR | Status: AC
  Filled 2012-06-17: qty 1

## 2012-06-17 MED ORDER — MIDAZOLAM HCL 2 MG/2ML IJ SOLN
INTRAMUSCULAR | Status: AC
  Filled 2012-06-17: qty 2

## 2012-06-17 MED ORDER — MORPHINE SULFATE 4 MG/ML IJ SOLN
2.0000 mg | INTRAMUSCULAR | Status: DC | PRN
  Administered 2012-06-17: 2 mg via INTRAVENOUS
  Filled 2012-06-17: qty 1

## 2012-06-17 MED ORDER — ACETAMINOPHEN 325 MG PO TABS: 650.0000 mg | ORAL_TABLET | ORAL | Status: DC | PRN

## 2012-06-17 MED ORDER — ONDANSETRON HCL 4 MG/2ML IJ SOLN: 4.0000 mg | Freq: Four times a day (QID) | INTRAMUSCULAR | Status: DC | PRN

## 2012-06-17 MED ORDER — FENTANYL CITRATE 0.05 MG/ML IJ SOLN
INTRAMUSCULAR | Status: AC
  Filled 2012-06-17: qty 2

## 2012-06-17 MED ORDER — LIDOCAINE HCL (PF) 1 % IJ SOLN
INTRAMUSCULAR | Status: AC
  Filled 2012-06-17: qty 30

## 2012-06-17 MED ORDER — HEPARIN (PORCINE) IN NACL 2-0.9 UNIT/ML-% IJ SOLN
INTRAMUSCULAR | Status: AC
  Filled 2012-06-17: qty 1000

## 2012-06-17 MED ORDER — VERAPAMIL HCL 2.5 MG/ML IV SOLN
INTRAVENOUS | Status: AC
  Filled 2012-06-17: qty 2

## 2012-06-17 MED ORDER — NITROGLYCERIN 0.2 MG/ML ON CALL CATH LAB
INTRAVENOUS | Status: AC
  Filled 2012-06-17: qty 1

## 2012-06-17 MED ORDER — SODIUM CHLORIDE 0.9 % IV SOLN
INTRAVENOUS | Status: AC
  Administered 2012-06-17: 16:00:00 via INTRAVENOUS

## 2012-06-17 NOTE — H&P (Signed)
    Pt was reexamined and existing H & P reviewed. No changes found.  Runell Gess, MD [facility] 06/17/2012 1:47 PM

## 2012-06-17 NOTE — Progress Notes (Signed)
ANTICOAGULATION CONSULT NOTE - Follow Up Consult  Pharmacy Consult for Heparin Indication: atrial fibrillation  No Known Allergies  Vital Signs: Temp: 98.2 F (36.8 C) (07/16 0446) Temp src: Oral (07/16 0446) BP: 143/60 mmHg (07/16 0446) Pulse Rate: 64  (07/16 0446)  Labs:  Basename 06/17/12 0536 06/16/12 0500 06/15/12 0349 06/14/12 0829  HGB 9.9* 10.1* -- --  HCT 31.1* 31.8* 29.7* --  PLT 206 210 222 --  APTT -- -- -- --  LABPROT 17.0* -- -- 20.9*  INR 1.36 -- -- 1.77*  HEPARINUNFRC 0.22* 0.48 0.40 --  CREATININE 1.69* 1.75* 1.82* --  CKTOTAL -- -- -- --  CKMB -- -- -- --  TROPONINI -- -- -- --   Estimated Creatinine Clearance: 44.6 ml/min (by C-G formula based on Cr of 1.69).  Medications:  Heparin @ 1600 units/hr  Assessment: [name] s/p LLE angiogram 7/12 continues on heparin bridge (coumadin on hold) awaiting staged revascularization today and is scheduled for this afternoon.  Heparin level has dropped and is now sub-therapeutic at 0.22 IU/ml.  CBC is  stable. Serum creatnine improving. No bleeding and no IV site infusion issues per nurse.  Goal of Therapy:  Heparin level 0.3-0.7 units/ml Monitor platelets by anticoagulation protocol: Yes   Plan:  1)  Increase IV  heparin to 1700 units/hr 2) Follow up after procedure for ongoing anticoagulation needs.  Nadara Mustard, PharmD., MS Clinical Pharmacist Pager:  [phone]  Thank you for allowing pharmacy to be part of this patients care team. 06/17/2012,6:36 AM

## 2012-06-17 NOTE — Progress Notes (Signed)
Thank you to Dr Jacky Kindle for this referral via LOS meeting.  Chart review complete.  Patient is ineligible for services due to primary residence being in [place], [place].  In addition, Dr Corrie Mckusick of [facility] is her PCP.  This group is not affiliated with the [facility]).  For any additional questions or new referrals please contact Anibal Henderson BSN RN [facility] Liaison at [phone].

## 2012-06-17 NOTE — Op Note (Signed)
[name] is a 77 y.o. male    [id number] LOCATION:  FACILITY: [facility]  PHYSICIAN: Nanetta Batty, M.D. 1934/12/05   DATE OF PROCEDURE:  06/17/2012  DATE OF DISCHARGE:  [facility]  PV Intervention    History obtained from chart review. [name] is a 77 year old male patient of Dr. 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 Cornelius Moras in 2003, insulin-dependent diabetes, chronic atrial fibrillation on Coumadin anticoagulation, hyperlipidemia, and chronic renal insufficiency. He saw Dr. Alanda Amass in the office on July 1 with a left heel ulcer. His Dopplers revealed a diminished left ABI. He was admitted for hydration, and left lower extremity angiography with potential percutaneous intervention for critical limb ischemia/limb salvage. His chronic renal insufficiency the patient was staged and hydrated.he presents today for attempt at percutaneous revascularization of his left popliteal and tibial peroneal trunk for limb salvage because of critical limb ischemia and nonhealing ulcer.    PROCEDURE DESCRIPTION:    The patient was brought to the second floor  [facility] lab in the postabsorptive state. He was  premedicated with Valium 5 mg by mouth, IV Versed and fentanyl.Marland Kitchen His right groin was prepped and shaved in usual sterile fashion. Xylocaine 1% was used  for local anesthesia. A 7 French sheath was inserted into the right common femoral  artery using standard Seldinger technique. The patient received  10,500 units  of heparin  intravenously.  A total of 193 cc of contrast was administered to the patient. The ending ACT was 229.    Hemodynamics    AO SYSTOLIC/AO DIASTOLIC: 142/61     ANGIOGRAPHIC RESULTS:   Contralateral access was obtained with a 5 [place] crossover catheter, 035 angled Glidewire, 035 Rosen wire, a 7 [place] destination  sheath. Visipaque dye was used for the entirety of the case. Retrograde aortic pressure was monitored during the case. Using an 018 angled C. Excised endhole catheter along with an 014 300 cm length Rigali wire the left SFA was navigated down to the above-the-knee popliteal. Attempts were then made to cross the total occlusion with the Rigali wire, treasure 12 wire. Following this an Asahi Astado   20 gm tip load wire was used successfully to cross the distal cap of the anterior tibial artery.I was able to then crossed using a 1.25 mm over-the-wire balloon and exchanged for a Viper wire. Following this Dynabac orbital rotation left artery was performed with a 1.25 mm bur of 120,000 rpm's. Cardiorespiratory was then performed with a 3.5 mm meter by 100 mm long angiosculpt  balloon up to 12 atmospheres results aortography date long total occlusion of the below-the-knee popliteal and anterior tibial to 0% residual but dissection. Completion angiography was performed a stepwise fashion down to the level of foot revealing widely patent TIMI  3 flow with collateralization of the posterior tibial artery.the 7 French sheath was then withdrawn across the bifurcation and sewn securely in place.the patient left the lab in stable condition.   IMPRESSION:Successful diamondback orbital rotational atherectomy, and angioscope cutting balloon atherectomy of a totally occluded below the knee popliteal and anterior tibial artery for percutaneous revascularization in the setting of  Critical limb ischemia and nonhealing ulcer. The patient tolerated the procedure well. He left the lab in stable condition. The sheath will be removed once the ACT falls below 170 and pressure will be held on the groin to achieve hemostasis.  There patient will be hydrated overnight and antibiotics will be continued.  Bruce Gess MD, [facility] 06/17/2012 3:51 PM

## 2012-06-17 NOTE — Progress Notes (Signed)
Pt had 11 beats of V-tach, pt asymptomatic, VS stable. Md on call notified, no orders given, will continue to monitor.

## 2012-06-17 NOTE — Progress Notes (Signed)
Subjective: No Complaints  Objective: Vital signs in last 24 hours: Temp:  [98.2 F (36.8 C)-98.6 F (37 C)] 98.2 F (36.8 C) (07/16 0446) Pulse Rate:  [54-65] 64  (07/16 0446) Resp:  [16-18] 16  (07/16 0446) BP: (124-152)/(60-67) 143/60 mmHg (07/16 0446) SpO2:  [94 %-99 %] 94 % (07/16 0446) Weight:  [98 kg (216 lb 0.8 oz)] 98 kg (216 lb 0.8 oz) (07/16 0148) Weight change: 2.1 kg (4 lb 10.1 oz) Last BM Date: 06/15/12 Intake/Output from previous day: +317 07/15 0701 - 07/16 0700 In: 927.6 [P.O.:600; I.V.:327.6] Out: 610 [Urine:610] Intake/Output this shift:    PE: General:alert and oriented, Hard of hearing, no complaints Heart:S1S2 somewhat irreg. Lungs:clear, without rales, rhonchi or wheezes Abd:+ BS, soft, non tender Ext:rt. Leg with mild edema, lt foot with 3+ edema, dressing D&I Neuro:alert and oriented.   Lab Results:  Basename 06/17/12 0536 06/16/12 0500  WBC 9.7 9.1  HGB 9.9* 10.1*  HCT 31.1* 31.8*  PLT 206 210   BMET  Basename 06/17/12 0536 06/16/12 0500  NA 134* 138  K 4.6 4.5  CL 100 104  CO2 22 23  GLUCOSE 206* 159*  BUN 39* 43*  CREATININE 1.69* 1.75*  CALCIUM 9.3 9.5   No results found for this basename: TROPONINI:2,CK,MB:2 in the last 72 hours  Lab Results  Component Value Date   CHOL  Value: 115        ATP III CLASSIFICATION:  <200     mg/dL   Desirable  409-811  mg/dL   Borderline High  >=914    mg/dL   High 78/01/9561   HDL 25* 11/07/2007   LDLCALC  Value: 50        Total Cholesterol/HDL:CHD Risk Coronary Heart Disease Risk Table                     Men   Women  1/2 Average Risk   3.4   3.3 11/07/2007   TRIG 198* 11/07/2007   CHOLHDL 4.6 11/07/2007   Lab Results  Component Value Date   HGBA1C 8.2* 06/11/2012     Lab Results  Component Value Date   TSH 1.128 06/11/2012        EKG: Orders placed during the hospital encounter of 06/11/12  . EKG 12-LEAD  . EKG 12-LEAD  . EKG 12-LEAD  . EKG 12-LEAD  . EKG 12-LEAD  . EKG 12-LEAD  .  EKG 12-LEAD    Studies/Results: No results found.  Medications: I have reviewed the patient's current medications.    Marland Kitchen allopurinol  100 mg Oral Daily  . aspirin  324 mg Oral Pre-Cath  . carvedilol  25 mg Oral BID WC  . cefTRIAXone (ROCEPHIN)  IV  1 g Intravenous Q24H  . diazepam  5 mg Oral On Call  . docusate sodium  100 mg Oral BID  . hydrALAZINE  25 mg Oral TID  . insulin aspart  0-15 Units Subcutaneous TID WC  . insulin aspart  0-5 Units Subcutaneous QHS  . insulin glargine  10 Units Subcutaneous BID  . isosorbide dinitrate  20 mg Oral TID  . levothyroxine  50 mcg Oral QAC breakfast  . pantoprazole  80 mg Oral Q1200  . simvastatin  40 mg Oral QHS  . sodium chloride  3 mL Intravenous Q12H  . DISCONTD: potassium chloride  40 mEq Oral Daily   Assessment/Plan: Patient Active Problem List  Diagnosis  . Hx of CABG x 6 2003  .  ICD (implantable cardiac defibrillator) in place  . Chronic a-fib  . Diabetes mellitus type 2, insulin dependent  . CKD (chronic kidney disease) stage 4, GFR 15-29 ml/min  . Positive Coombs test  . Anemia due to chronic illness  . HTN (hypertension)  . Hypokalemia  . Ischemic cardiomyopathy, EF 25% 2D 12/12  . PVD, LLE PVA 06/13/12- for LLE PTA 7/16  . Dyslipidemia  . Chronic anticoagulation  . Non-sustained ventricular tachycardia  . Sleep apnea, C-Pap intol  . Diabetic foot infection, just discharged 05/30/12  . Gout  . Hypothyroidism  . ICD discharge in setting of K+ 3.0  . Acute on chronic renal failure, SCr 2.3-3.3 secondary to dehydration  . Chronic systolic CHF (congestive heart failure)  . Anemia, Hgb 11- 9.0 with hydration    PLAN: Cr. 1.69 today.  For PCI below the knee popliteal, for blood flow to assist with healing. Of lt. Heel ulcer.  Glucose up and down, Was on higher doses of insulin outpatient.  After procedure begin Home insulin though at lower strengths.  11 beats NSVT this AM at 0500. Despite increase of coreg.   On IV  Heparin for permanent a. Fib. While off of coumadin.   Continues on Rocephin, added on admit for heel ulcer with elevated WBC. -today Day # 7  LOS: 6 days   INGOLD,LAURA R 06/17/2012, 7:58 AM   Agree with note written by Nada Boozer RNP  Pt for PV angio, DB atherectomy, PTA =/- stent for CLI.  Runell Gess 06/17/2012 12:53 PM

## 2012-06-17 NOTE — Progress Notes (Signed)
Inpatient Diabetes Program Recommendations  AACE/ADA: New Consensus Statement on Inpatient Glycemic Control (2009)  Target Ranges:  Prepandial:   less than 140 mg/dL      Peak postprandial:   less than 180 mg/dL (1-2 hours)      Critically ill patients:  140 - 180 mg/dL   Reason for Visit: Hyperglycemia  Inpatient Diabetes Program Recommendations Insulin - Basal: Please increase Lantus to 20 units bid.  Fasting glucose in 200's Please continue to increase Lantus until fasting glucose is controlled.  Once eating, can start Novolog meal coverage as well.   Note: Thank you, Lenor Coffin, RN, CNS, Diabetes Coordinator [phone])

## 2012-06-18 LAB — BASIC METABOLIC PANEL
BUN: 32 mg/dL — ABNORMAL HIGH (ref 6–23)
CO2: 20 mEq/L (ref 19–32)
Calcium: 9.2 mg/dL (ref 8.4–10.5)
Chloride: 105 mEq/L (ref 96–112)
Creatinine, Ser: 1.57 mg/dL — ABNORMAL HIGH (ref 0.50–1.35)
Glucose, Bld: 206 mg/dL — ABNORMAL HIGH (ref 70–99)

## 2012-06-18 LAB — CBC
HCT: 29.9 % — ABNORMAL LOW (ref 39.0–52.0)
Hemoglobin: 9.6 g/dL — ABNORMAL LOW (ref 13.0–17.0)
MCH: 28.7 pg (ref 26.0–34.0)
MCV: 89.3 fL (ref 78.0–100.0)
Platelets: 181 10*3/uL (ref 150–400)
RBC: 3.35 MIL/uL — ABNORMAL LOW (ref 4.22–5.81)
WBC: 9.3 10*3/uL (ref 4.0–10.5)

## 2012-06-18 LAB — POCT ACTIVATED CLOTTING TIME
Activated Clotting Time: 189 seconds
Activated Clotting Time: 164 seconds

## 2012-06-18 LAB — GLUCOSE, CAPILLARY: Glucose-Capillary: 169 mg/dL — ABNORMAL HIGH (ref 70–99)

## 2012-06-18 MED ORDER — COLLAGENASE 250 UNIT/GM EX OINT: TOPICAL_OINTMENT | Freq: Every day | CUTANEOUS | Status: AC

## 2012-06-18 MED ORDER — INSULIN GLARGINE 100 UNIT/ML ~~LOC~~ SOLN: 10.0000 [IU] | Freq: Two times a day (BID) | SUBCUTANEOUS | Status: DC

## 2012-06-18 MED ORDER — COLLAGENASE 250 UNIT/GM EX OINT
TOPICAL_OINTMENT | Freq: Every day | CUTANEOUS | Status: DC
  Administered 2012-06-18: 13:00:00 via TOPICAL
  Filled 2012-06-18: qty 30

## 2012-06-18 MED ORDER — CARVEDILOL 25 MG PO TABS: 25.0000 mg | ORAL_TABLET | Freq: Two times a day (BID) | ORAL | Status: DC

## 2012-06-18 MED ORDER — POLYETHYLENE GLYCOL 3350 17 G PO PACK: 17.0000 g | PACK | Freq: Every day | ORAL | Status: AC | PRN

## 2012-06-18 MED ORDER — ALLOPURINOL 100 MG PO TABS: 100.0000 mg | ORAL_TABLET | Freq: Every day | ORAL | Status: DC

## 2012-06-18 NOTE — Progress Notes (Signed)
Subjective:  No CP/SOB  Objective:  Temp:  [97.6 F (36.4 C)-98.1 F (36.7 C)] 98 F (36.7 C) (07/17 0748) Pulse Rate:  [38-66] 61  (07/17 0748) Resp:  [16-28] 28  (07/17 0748) BP: (129-168)/(43-79) 145/52 mmHg (07/17 0748) SpO2:  [94 %-100 %] 97 % (07/17 0748) Weight:  [98.3 kg (216 lb 11.4 oz)] 98.3 kg (216 lb 11.4 oz) (07/17 0000) Weight change: 0.3 kg (10.6 oz)  Intake/Output from previous day: 07/16 0701 - 07/17 0700 In: 208.8 [I.V.:208.8] Out: 1000 [Urine:1000]  Intake/Output from this shift:    Physical Exam: General appearance: alert and cooperative Neck: no adenopathy, no carotid bruit, no JVD, supple, symmetrical, trachea midline and thyroid not enlarged, symmetric, no tenderness/mass/nodules Lungs: clear to auscultation bilaterally Heart: regular rate and rhythm, S1, S2 normal, no murmur, click, rub or gallop Extremities: 1+ edema Groin OK  Lab Results: Results for orders placed during the hospital encounter of 06/11/12 (from the past 48 hour(s))  GLUCOSE, CAPILLARY     Status: Abnormal   Collection Time   06/16/12  4:16 PM      Component Value Range Comment   Glucose-Capillary 189 (*) 70 - 99 mg/dL   GLUCOSE, CAPILLARY     Status: Abnormal   Collection Time   06/16/12  8:59 PM      Component Value Range Comment   Glucose-Capillary 173 (*) 70 - 99 mg/dL   CBC     Status: Abnormal   Collection Time   06/17/12  5:36 AM      Component Value Range Comment   WBC 9.7  4.0 - 10.5 K/uL    RBC 3.47 (*) 4.22 - 5.81 MIL/uL    Hemoglobin 9.9 (*) 13.0 - 17.0 g/dL    HCT 16.1 (*) 09.6 - 52.0 %    MCV 89.6  78.0 - 100.0 fL    MCH 28.5  26.0 - 34.0 pg    MCHC 31.8  30.0 - 36.0 g/dL    RDW 04.5 (*) 40.9 - 15.5 %    Platelets 206  150 - 400 K/uL   BASIC METABOLIC PANEL     Status: Abnormal   Collection Time   06/17/12  5:36 AM      Component Value Range Comment   Sodium 134 (*) 135 - 145 mEq/L    Potassium 4.6  3.5 - 5.1 mEq/L    Chloride 100  96 - 112 mEq/L    CO2 22  19 - 32 mEq/L    Glucose, Bld 206 (*) 70 - 99 mg/dL    BUN 39 (*) 6 - 23 mg/dL    Creatinine, Ser 8.11 (*) 0.50 - 1.35 mg/dL    Calcium 9.3  8.4 - 91.4 mg/dL    GFR calc non Af Amer 38 (*) >90 mL/min    GFR calc Af Amer 44 (*) >90 mL/min   HEPARIN LEVEL (UNFRACTIONATED)     Status: Abnormal   Collection Time   06/17/12  5:36 AM      Component Value Range Comment   Heparin Unfractionated 0.22 (*) 0.30 - 0.70 IU/mL   PROTIME-INR     Status: Abnormal   Collection Time   06/17/12  5:36 AM      Component Value Range Comment   Prothrombin Time 17.0 (*) 11.6 - 15.2 seconds    INR 1.36  0.00 - 1.49   GLUCOSE, CAPILLARY     Status: Abnormal   Collection Time   06/17/12  6:26 AM  Component Value Range Comment   Glucose-Capillary 207 (*) 70 - 99 mg/dL   POCT ACTIVATED CLOTTING TIME     Status: Normal   Collection Time   06/17/12  2:12 PM      Component Value Range Comment   Activated Clotting Time 209     POCT ACTIVATED CLOTTING TIME     Status: Normal   Collection Time   06/17/12  2:48 PM      Component Value Range Comment   Activated Clotting Time 209     POCT ACTIVATED CLOTTING TIME     Status: Normal   Collection Time   06/17/12  3:00 PM      Component Value Range Comment   Activated Clotting Time 219     POCT ACTIVATED CLOTTING TIME     Status: Normal   Collection Time   06/17/12  3:34 PM      Component Value Range Comment   Activated Clotting Time 229     GLUCOSE, CAPILLARY     Status: Abnormal   Collection Time   06/17/12  4:04 PM      Component Value Range Comment   Glucose-Capillary 139 (*) 70 - 99 mg/dL   POCT ACTIVATED CLOTTING TIME     Status: Normal   Collection Time   06/17/12  4:31 PM      Component Value Range Comment   Activated Clotting Time 204     GLUCOSE, CAPILLARY     Status: Normal   Collection Time   06/17/12  9:38 PM      Component Value Range Comment   Glucose-Capillary 99  70 - 99 mg/dL    Comment 1 Documented in Chart      Comment 2 Notify  RN     CBC     Status: Abnormal   Collection Time   06/18/12  6:05 AM      Component Value Range Comment   WBC 9.3  4.0 - 10.5 K/uL    RBC 3.35 (*) 4.22 - 5.81 MIL/uL    Hemoglobin 9.6 (*) 13.0 - 17.0 g/dL    HCT 16.1 (*) 09.6 - 52.0 %    MCV 89.3  78.0 - 100.0 fL    MCH 28.7  26.0 - 34.0 pg    MCHC 32.1  30.0 - 36.0 g/dL    RDW 04.5 (*) 40.9 - 15.5 %    Platelets 181  150 - 400 K/uL   BASIC METABOLIC PANEL     Status: Abnormal   Collection Time   06/18/12  6:05 AM      Component Value Range Comment   Sodium 136  135 - 145 mEq/L    Potassium 4.4  3.5 - 5.1 mEq/L    Chloride 105  96 - 112 mEq/L    CO2 20  19 - 32 mEq/L    Glucose, Bld 206 (*) 70 - 99 mg/dL    BUN 32 (*) 6 - 23 mg/dL    Creatinine, Ser 8.11 (*) 0.50 - 1.35 mg/dL    Calcium 9.2  8.4 - 91.4 mg/dL    GFR calc non Af Amer 42 (*) >90 mL/min    GFR calc Af Amer 48 (*) >90 mL/min     Imaging: Imaging results have been reviewed  Assessment/Plan:   1. Principal Problem: 2.  *ICD discharge in setting of K+ 3.0 3. Active Problems: 4.  Hx of CABG x 6 2003 5.  ICD (implantable cardiac defibrillator) in place  6.  Chronic a-fib 7.  Diabetes mellitus type 2, insulin dependent 8.  CKD (chronic kidney disease) stage 4, GFR 15-29 ml/min 9.  Positive Coombs test 10.  Anemia due to chronic illness 11.  HTN (hypertension) 12.  Hypokalemia 13.  Ischemic cardiomyopathy, EF 25% 2D 12/12 14.  PVD, LLE PVA 06/13/12- for LLE PTA 7/16 15.  Dyslipidemia 16.  Chronic anticoagulation 17.  Non-sustained ventricular tachycardia 18.  Sleep apnea, C-Pap intol 19.  Diabetic foot infection, just discharged 05/30/12 20.  Gout 21.  Hypothyroidism 22.  Acute on chronic renal failure, SCr 2.3-3.3 secondary to dehydration 23.  Chronic systolic CHF (congestive heart failure) 24.  Anemia, Hgb 11- 9.0 with hydration 25.   Time Spent Directly with Patient:  20 minutes  Length of Stay:  LOS: 7 days   S/P Left popliteal and AT DB  atherectomy /PTA with angiosculpt with excellent angiographic result. Groin OK. Scr improved. Exam otherwise benign. Would being dressed at home by [facility] nurse Newt Lukes). He probably should be seen by would care team and would benefit from OP debridement by Dr. Aldean Baker. Needs ROV with me 2-3 weeks as well as LEAs prior to OV.  Runell Gess 06/18/2012, 8:51 AM

## 2012-06-18 NOTE — Consult Note (Signed)
WOC consult Note Reason for Consult: Pt familiar to WOC service from remote wound consult performed last when at [facility].  Pt had chronic unstageable wound to left heel at that time and Santyl was ordered.  Pt states he has been followed by home health but does not think they are putting any type of ointment on the wound. Wound type: Unstageable  To left heel Pressure Ulcer POA: Yes Measurement: 3X3cm Wound bed: 100% tightly adhered yellow-white slough Drainage (amount, consistency, odor) Small tan draiange, no odor. Periwound: Trimmed calloused skin surrounding wound. Dressing procedure/placement/frequency: Santyl ointment to provide chemical debridement of nonviable tissue.  Pt could benefit from followup at outpatient wound center in [place] or [place] if desired and continued assistance from home health for dressing changes.  Float heel to reduce pressure when in bed. Will not plan to follow further unless re-consulted.  [address] RN, MSN, [employer]  [phone]

## 2012-06-18 NOTE — Progress Notes (Signed)
Site area: Right groin  Site Prior to Removal:  Level 0  Pressure Applied For 30 MINUTES    Minutes Beginning at 20:40  Manual:   Yes  Patient Status During Pull:  WNL  Post Pull Groin Site:  Level 0  Post Pull Instructions Given:  yes  Post Pull Pulses Present: Yes  Dressing Applied: Yes

## 2012-06-20 NOTE — Discharge Summary (Signed)
Physician Discharge Summary  Patient ID: [name] MRN: [id number] DOB/AGE: 75-Oct-1936 75 y.o.  Admit date: 06/11/2012 Discharge date: 06/20/2012  Admission Diagnoses:   Ventricular fibrillation with ICD discharge.  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  ICD (implantable cardiac defibrillator) in place  Chronic a-fib  Diabetes mellitus type 2, insulin dependent  CKD (chronic kidney disease) stage 4, GFR 15-29 ml/min  Positive Coombs test  Anemia due to chronic illness  HTN (hypertension)  Hypokalemia  Ischemic cardiomyopathy, EF 25% 2D 12/12  PVD, LLE PVA 06/13/12- for LLE PTA 7/16  Dyslipidemia  Chronic anticoagulation  Non-sustained ventricular tachycardia  Sleep apnea, C-Pap intol  Diabetic foot infection, just discharged 05/30/12  Gout  Hypothyroidism  Acute on chronic renal failure, SCr 2.3-3.3 secondary to dehydration  Chronic systolic CHF (congestive heart failure)  Anemia, Hgb 11- 9.0 with hydration   Discharged Condition: stable  Hospital Course:   A 22 you male with appropriately discharged ICD for VF, around 10 AM. He was in bed at the time and thinks he was asleep. He was completely unaware of the shock, which we were notified of via the remote monitoring [facility] system.  He has a past medical history significant for CAD s/p CABG, severe ischemic cardiomyopathy (EF 25-30%), congestive heart failure (systolic and diastolic, chronic), permanent atrial fibrillation, severe PAD, chronic kidney disease stage IV, insulin requiring type II diabetes mellitus, recent autoimmune hemolytic anemia, tophaceous gout and rheumatoid arthritis.  Labs done yesterday show a potassium of 3.0 and creatinine of 3.3 (baseline around 2.0, 1.94 on 05/26/12).  He was recently admitted to [facility] for infected left foot diabetic ulcer and ischemic foot cultures grew group A strep, treated with IV Zosyn and vanco,  then 10 day course of Augmentin. ABI was 0.61 by arterial Dopplers and he was scheduled for LLE angio on Friday with Dr. Allyson Sabal.  Appetite has been poor and he has lost at least 18 pounds since late May.  He was admitted for rehydration and electrolyte replenishment.  IV heparin for afib.  Coumadin held for PVA.  Initial diagnostic PVA completed on 06/13/12 and intervention completed on 06/17/12(See notes below).  Successful diamondback orbital rotational atherectomy, and angioscope cutting balloon atherectomy of a totally occluded below the knee popliteal and anterior tibial artery for percutaneous revascularization in the setting of Critical limb ischemia and nonhealing ulcer.  Increased carvedilol to 25 mg BID.  No ACE or ARB due to CKD. Hydralazine and nitrates given for afterload reduction.  Rocephin was added at admission for heel ulcer.  The patient made it clear that he "would rather die" than go on hemodialysis.  Coumadin was restarted. The patient was set up for an appointment with Dr. Lajoyce Corners for possible debridement of his left heel ulcer.  Home Health was also continued for wound care.  He will have LEA dopplers as an outpt then return to se in ae Dr. Allyson Sabal in 2-3 Weeks.    Consults: Wound Care, diabetes coordinator  Significant Diagnostic Studies: PROCEDURE DESCRIPTION:  The patient was brought to the second floor  [facility] cath lab in the postabsorptive state. He was  premedicated with Valium 5 mg by mouth, IV Versed and fentanyl.Marland Kitchen His right groin  was prepped and shaved in usual sterile fashion. Xylocaine 1% was used  for local anesthesia. A 7 French sheath was inserted into the right common femoral  artery using  standard Seldinger technique. The patient received  10,500 units of heparin intravenously. A total of 193 cc of contrast was administered to the patient. The ending ACT was 229.  Hemodynamics  AO SYSTOLIC/AO DIASTOLIC: 142/61  ANGIOGRAPHIC RESULTS:  Contralateral access  was obtained with a 5 [place] crossover catheter, 035 angled Glidewire, 035 Rosen wire, a 7 [place] destination sheath. Visipaque dye was used for the entirety of the case. Retrograde aortic pressure was monitored during the case. Using an 018 angled C. Excised endhole catheter along with an 014 300 cm length Rigali wire the left SFA was navigated down to the above-the-knee popliteal. Attempts were then made to cross the total occlusion with the Rigali wire, treasure 12 wire. Following this an Asahi Astado 20 gm tip load wire was used successfully to cross the distal cap of the anterior tibial artery.I was able to then crossed using a 1.25 mm over-the-wire balloon and exchanged for a Viper wire. Following this Dynabac orbital rotation left artery was performed with a 1.25 mm bur of 120,000 rpm's. Cardiorespiratory was then performed with a 3.5 mm meter by 100 mm long angiosculpt balloon up to 12 atmospheres results aortography date long total occlusion of the below-the-knee popliteal and anterior tibial to 0% residual but dissection. Completion angiography was performed a stepwise fashion down to the level of foot revealing widely patent TIMI 3 flow with collateralization of the posterior tibial artery.the 7 French sheath was then withdrawn across the bifurcation and sewn securely in place.the patient left the lab in stable condition.  IMPRESSION:Successful diamondback orbital rotational atherectomy, and angioscope cutting balloon atherectomy of a totally occluded below the knee popliteal and anterior tibial artery for percutaneous revascularization in the setting of Critical limb ischemia and nonhealing ulcer. The patient tolerated the procedure well. He left the lab in stable condition. The sheath will be removed once the ACT falls below 170 and pressure will be held on the groin to achieve hemostasis. There patient will be hydrated overnight and antibiotics will be continued.  Runell Gess MD, [facility]    06/17/2012  PROCEDURE DESCRIPTION:  The patient was brought to the second floor   Cardiac cath lab in the postabsorptive state. He was  premedicated with Valium 5 mg by mouth. His right groin  was prepped and shaved in usual sterile fashion. Xylocaine 1% was used  for local anesthesia. A 5 French sheath was inserted into the right common femoral  artery using standard Seldinger technique.a 5 [place] crossover catheter, and endhole catheter was used for left lower extremity angiography using bolus chase, digital subtraction, step table technique. Visipaque dye was used for the entirety of the case. Retrograde aortic pressure was monitored during the case.  HEMODYNAMICS:  AO SYSTOLIC/AO DIASTOLIC: 157/79  ANGIOGRAPHIC RESULTS:  1: Left lower extremity-50-70% proximal left SFA stenosis. The below-knee popliteal a subtotally occluded. The tibial peroneal trunk and anterior tib and peroneal were occluded as well the reconstituted. I never saw the posterior tibial.  IMPRESSION:Mr. Eberlin has a subtotally occluded below the knee popliteal, Anterior tibial and tibioperoneal trunk with critical limb ischemia and the ischemic left heel ulcer. A total of 85 cc of contrast was administered the patient. The sheath was removed and pressure was held the groin to achieve hemostasis. He'll be hydrated and his renal function will be followed. Heparin will be restarted because of his chronic A. Fib. Plans will be to perform percutaneous rest position of his below the knee popliteal to be pale trunk were to  provide potential blood flow for healing.  Runell Gess MD, [facility]  CBC    Component Value Date/Time   WBC 9.3 06/18/2012 0605   WBC 7.9 04/13/2008 0854   RBC 3.35* 06/18/2012 0605   RBC 4.14* 04/13/2008 0854   HGB 9.6* 06/18/2012 0605   HGB 12.3* 04/13/2008 0854   HCT 29.9* 06/18/2012 0605   HCT 35.7* 04/13/2008 0854   PLT 181 06/18/2012 0605   PLT 181 04/13/2008 0854   MCV 89.3 06/18/2012 0605   MCV  86.1 04/13/2008 0854   MCH 28.7 06/18/2012 0605   MCH 29.6 04/13/2008 0854   MCHC 32.1 06/18/2012 0605   MCHC 34.4 04/13/2008 0854   RDW 16.5* 06/18/2012 0605   RDW 18.0* 04/13/2008 0854   LYMPHSABS 1.5 06/11/2012 1744   LYMPHSABS 1.5 04/13/2008 0854   MONOABS 1.2* 06/11/2012 1744   MONOABS 0.6 04/13/2008 0854   EOSABS 0.1 06/11/2012 1744   EOSABS 0.2 04/13/2008 0854   BASOSABS 0.0 06/11/2012 1744   BASOSABS 0.1 04/13/2008 0854    BMET    Component Value Date/Time   NA 136 06/18/2012 0605   K 4.4 06/18/2012 0605   CL 105 06/18/2012 0605   CO2 20 06/18/2012 0605   GLUCOSE 206* 06/18/2012 0605   BUN 32* 06/18/2012 0605   CREATININE 1.57* 06/18/2012 0605   CALCIUM 9.2 06/18/2012 0605   GFRNONAA 42* 06/18/2012 0605   GFRAA 48* 06/18/2012 0605      Treatments: See Summary  Discharge Exam: Blood pressure 149/67, pulse 67, temperature 98 F (36.7 C), temperature source Oral, resp. rate 16, height 6\' 2"  (1.88 m), weight 98.3 kg (216 lb 11.4 oz), SpO2 97.00%.   Disposition: 01-Home or Self Care  Discharge Orders    Future Orders Please Complete By Expires   Diet - low sodium heart healthy      Increase activity slowly      Discharge instructions      Comments:   No Lifting more than a gallon of milk or driving for three days.     Medication List  As of 06/20/2012  3:47 PM   STOP taking these medications         potassium chloride SA 20 MEQ tablet         TAKE these medications         allopurinol 100 MG tablet   Commonly known as: ZYLOPRIM   Take 1 tablet (100 mg total) by mouth daily.      carvedilol 25 MG tablet   Commonly known as: COREG   Take 1 tablet (25 mg total) by mouth 2 (two) times daily with a meal.      collagenase ointment   Commonly known as: SANTYL   Apply topically daily.      furosemide 80 MG tablet   Commonly known as: LASIX   Take 80 mg by mouth 2 (two) times daily.      hydrALAZINE 25 MG tablet   Commonly known as: APRESOLINE   Take 25 mg by mouth 3  (three) times daily.      insulin aspart 100 UNIT/ML injection   Commonly known as: novoLOG   Inject 10-15 Units into the skin 3 (three) times daily before meals.      insulin glargine 100 UNIT/ML injection   Commonly known as: LANTUS   Inject 10 Units into the skin 2 (two) times daily.      isosorbide dinitrate 20 MG tablet   Commonly known as: ISORDIL  Take 20 mg by mouth 3 (three) times daily.      levothyroxine 50 MCG tablet   Commonly known as: SYNTHROID, LEVOTHROID   Take 50 mcg by mouth every morning.      metolazone 2.5 MG tablet   Commonly known as: ZAROXOLYN   Take 2.5 mg by mouth daily. States he takes with his furosemide every day except on Tuesday and Friday      omeprazole 20 MG capsule   Commonly known as: PRILOSEC   Take 40 mg by mouth every morning.      polyethylene glycol packet   Commonly known as: MIRALAX / GLYCOLAX   Take 17 g by mouth daily as needed.      simvastatin 40 MG tablet   Commonly known as: ZOCOR   Take 40 mg by mouth at bedtime.      warfarin 5 MG tablet   Commonly known as: COUMADIN   Take 2.5-5 mg by mouth every evening. Takes one tablet (5mg ) every day except on Tuesdays and Fridays, take one-half tablet (2.5mg )           Follow-up Information    Follow up with Runell Gess, MD. (Our office will call you with the appointment date and time.)    Contact information:   [address] [place] [address] [phone]       Follow up with Nadara Mustard, MD on 06/23/2012. (2:00 PM.   This is a wound visit.)    Contact information:   [address] [place] [address] [phone]          Signed: Wilburt Finlay 06/20/2012, 3:47 PM  Cc: Dr. Lajoyce Corners

## 2012-07-15 ENCOUNTER — Encounter (HOSPITAL_COMMUNITY): Payer: Self-pay

## 2012-07-15 ENCOUNTER — Emergency Department (HOSPITAL_COMMUNITY)
Admission: EM | Admit: 2012-07-15 | Discharge: 2012-07-15 | Disposition: A | Discharge status: Home / Self Care | Payer: Medicare Other | Attending: Emergency Medicine | Admitting: Emergency Medicine

## 2012-07-15 ENCOUNTER — Emergency Department (HOSPITAL_COMMUNITY): Payer: Medicare Other

## 2012-07-15 DIAGNOSIS — I2510 Atherosclerotic heart disease of native coronary artery without angina pectoris: Secondary | ICD-10-CM | POA: Insufficient documentation

## 2012-07-15 DIAGNOSIS — G319 Degenerative disease of nervous system, unspecified: Secondary | ICD-10-CM | POA: Insufficient documentation

## 2012-07-15 DIAGNOSIS — Z794 Long term (current) use of insulin: Secondary | ICD-10-CM | POA: Insufficient documentation

## 2012-07-15 DIAGNOSIS — Z951 Presence of aortocoronary bypass graft: Secondary | ICD-10-CM | POA: Insufficient documentation

## 2012-07-15 DIAGNOSIS — S0180XA Unspecified open wound of other part of head, initial encounter: Secondary | ICD-10-CM | POA: Insufficient documentation

## 2012-07-15 DIAGNOSIS — E119 Type 2 diabetes mellitus without complications: Secondary | ICD-10-CM | POA: Insufficient documentation

## 2012-07-15 DIAGNOSIS — W19XXXA Unspecified fall, initial encounter: Secondary | ICD-10-CM

## 2012-07-15 DIAGNOSIS — W010XXA Fall on same level from slipping, tripping and stumbling without subsequent striking against object, initial encounter: Secondary | ICD-10-CM | POA: Insufficient documentation

## 2012-07-15 DIAGNOSIS — S01119A Laceration without foreign body of unspecified eyelid and periocular area, initial encounter: Secondary | ICD-10-CM

## 2012-07-15 DIAGNOSIS — Z7901 Long term (current) use of anticoagulants: Secondary | ICD-10-CM | POA: Insufficient documentation

## 2012-07-15 DIAGNOSIS — I509 Heart failure, unspecified: Secondary | ICD-10-CM | POA: Insufficient documentation

## 2012-07-15 DIAGNOSIS — Y92009 Unspecified place in unspecified non-institutional (private) residence as the place of occurrence of the external cause: Secondary | ICD-10-CM | POA: Insufficient documentation

## 2012-07-15 DIAGNOSIS — N189 Chronic kidney disease, unspecified: Secondary | ICD-10-CM | POA: Insufficient documentation

## 2012-07-15 DIAGNOSIS — S0990XA Unspecified injury of head, initial encounter: Secondary | ICD-10-CM | POA: Insufficient documentation

## 2012-07-15 DIAGNOSIS — Z79899 Other long term (current) drug therapy: Secondary | ICD-10-CM | POA: Insufficient documentation

## 2012-07-15 DIAGNOSIS — Z5181 Encounter for therapeutic drug level monitoring: Secondary | ICD-10-CM | POA: Insufficient documentation

## 2012-07-15 MED ORDER — LIDOCAINE-EPINEPHRINE (PF) 1 %-1:200000 IJ SOLN
INTRAMUSCULAR | Status: AC
  Administered 2012-07-15: 18:00:00 via TOPICAL
  Filled 2012-07-15: qty 10

## 2012-07-15 MED ORDER — BACITRACIN-NEOMYCIN-POLYMYXIN 400-5-5000 EX OINT
TOPICAL_OINTMENT | CUTANEOUS | Status: AC
  Filled 2012-07-15: qty 1

## 2012-07-15 NOTE — ED Notes (Signed)
Pt has 2 skin tears to right FA - one skin tear to right upper arm - bacitracin and telfa dressing applied to all 3.  nad noted.

## 2012-07-15 NOTE — ED Notes (Signed)
Pt reports got up without his walker, lost balance, and fell.  Pt has laceration over left eye and multiple skin tears to left arm.  Denies any LOC.

## 2012-07-15 NOTE — ED Provider Notes (Signed)
History     CSN: [id number]  Arrival date & time 07/15/12  1648   First MD Initiated Contact with Patient 07/15/12 1700      Chief Complaint  Patient presents with  . Fall    (Consider location/radiation/quality/duration/timing/severity/associated sxs/prior treatment) HPI Comments: Patient was walking into the kitchen when he tripped and fell forward.  He struck his head on the hard floor and caused a laceration above the left eye.  He denies loc or neck pain.  He denies headache but does have mild pain over the injured area.   He is on coumadin.    Patient is a 77 y.o. male presenting with fall. The history is provided by the patient.  Fall The accident occurred less than 1 hour ago. The fall occurred while walking. He fell from a height of 1 to 2 ft. He landed on a hard floor. The volume of blood lost was moderate. The point of impact was the head. The pain is present in the head. The pain is mild. He was ambulatory at the scene. Pertinent negatives include no visual change, no abdominal pain and no vomiting.    Past Medical History  Diagnosis Date  . Shortness of breath   . Anemia   . Chronic kidney disease     renal insufficiency  . CHF (congestive heart failure)   . Dysrhythmia     atrial fibrilation  . Myocardial infarction   . COPD (chronic obstructive pulmonary disease)   . Diabetes mellitus   . Coronary artery disease   . Gout 05/28/2012  . Paroxysmal atrial fibrillation 11/05/2011    permanent   . Hypothyroidism 05/28/2012  . ICD (implantable cardiac defibrillator) discharge 06/11/2012  . Acute on chronic renal failure 06/11/2012  . ICD (implantable cardiac defibrillator) in place   . Chronic systolic CHF (congestive heart failure) 06/11/2012    Past Surgical History  Procedure Date  . Coronary artery bypass graft   . Cardiac catheterization     No family history on file.  History  Substance Use Topics  . Smoking status: Former Smoker    Quit date:  11/05/2003  . Smokeless tobacco: Never Used  . Alcohol Use: No      Review of Systems  Gastrointestinal: Negative for vomiting and abdominal pain.  All other systems reviewed and are negative.    Allergies  Review of patient's allergies indicates no known allergies.  Home Medications   Current Outpatient Rx  Name Route Sig Dispense Refill  . ALLOPURINOL 100 MG PO TABS Oral Take 1 tablet (100 mg total) by mouth daily. 30 tablet 5  . CARVEDILOL 25 MG PO TABS Oral Take 1 tablet (25 mg total) by mouth 2 (two) times daily with a meal. 60 tablet 5  . FUROSEMIDE 80 MG PO TABS Oral Take 80 mg by mouth 2 (two) times daily.    Marland Kitchen HYDRALAZINE HCL 25 MG PO TABS Oral Take 25 mg by mouth 3 (three) times daily.    . INSULIN ASPART 100 UNIT/ML [place] SOLN Subcutaneous Inject 10-15 Units into the skin 3 (three) times daily before meals.     . INSULIN GLARGINE 100 UNIT/ML [place] SOLN Subcutaneous Inject 10 Units into the skin 2 (two) times daily. 10 mL 10  . ISOSORBIDE DINITRATE 20 MG PO TABS Oral Take 20 mg by mouth 3 (three) times daily.    Marland Kitchen LEVOTHYROXINE SODIUM 50 MCG PO TABS Oral Take 50 mcg by mouth every morning.     Marland Kitchen  METOLAZONE 2.5 MG PO TABS Oral Take 2.5 mg by mouth daily. States he takes with his furosemide every day except on Tuesday and Friday    . OMEPRAZOLE 20 MG PO CPDR Oral Take 40 mg by mouth every morning.     Marland Kitchen SIMVASTATIN 40 MG PO TABS Oral Take 40 mg by mouth at bedtime.     . WARFARIN SODIUM 5 MG PO TABS Oral Take 2.5-5 mg by mouth every evening. Takes one tablet (5mg ) every day except on Tuesdays and Fridays, take one-half tablet (2.5mg )      BP 121/98  Pulse 73  Temp 97.6 F (36.4 C) (Oral)  Resp 16  Ht 6\' 2"  (1.88 m)  Wt 210 lb (95.255 kg)  BMI 26.96 kg/m2  SpO2 100%  Physical Exam  Nursing note and vitals reviewed. Constitutional: He is oriented to person, place, and time. He appears well-developed and well-nourished. No distress.  HENT:       There is a 3.5 cm  laceration above the left eyebrow.  It is gaping and extends through the subcutaneous tissue.  Bleeding is controlled.  Eyes: EOM are normal. Pupils are equal, round, and reactive to light.  Neck: Normal range of motion. Neck supple.  Cardiovascular: Normal rate and regular rhythm.   Pulmonary/Chest: Effort normal and breath sounds normal. No respiratory distress. He has no wheezes.  Abdominal: Soft. Bowel sounds are normal.  Musculoskeletal: Normal range of motion. He exhibits no edema.  Neurological: He is alert and oriented to person, place, and time. No cranial nerve deficit. He exhibits normal muscle tone. Coordination normal.  Skin: Skin is warm and dry. He is not diaphoretic.    ED Course  Procedures (including critical care time)  Labs Reviewed - No data to display No results found.   No diagnosis found.  LACERATION REPAIR Performed by: Geoffery Lyons Authorized by: Geoffery Lyons Consent: Verbal consent obtained. Risks and benefits: risks, benefits and alternatives were discussed Consent given by: patient Patient identity confirmed: provided demographic data Prepped and Draped in normal sterile fashion Wound explored  Laceration Location: right eybrow  Laceration Length: 3.5cm  No Foreign Bodies seen or palpated  Anesthesia: local infiltration  Local anesthetic: lidocaine 1% with epinephrine  Anesthetic total: 2 ml  Irrigation method: syringe Amount of cleaning: standard  Skin closure: prolene 6-0  Number of sutures: 7  Technique: simple interrupted  Patient tolerance: Patient tolerated the procedure well with no immediate complications.   MDM  Laceration repaired and the ct looks okay without evidence of bleed or fracture.  Will discharge to home with suture removal in 5 days.  Return prn.        Geoffery Lyons, MD 07/15/12 [phone]

## 2012-07-18 ENCOUNTER — Encounter (HOSPITAL_COMMUNITY): Payer: Self-pay | Admitting: Emergency Medicine

## 2012-07-18 ENCOUNTER — Inpatient Hospital Stay (HOSPITAL_COMMUNITY)
Admission: EM | Admit: 2012-07-18 | Discharge: 2012-07-23 | DRG: 872 | Disposition: A | Discharge status: Home Health | Payer: Medicare Other | Attending: Internal Medicine | Admitting: Internal Medicine

## 2012-07-18 ENCOUNTER — Emergency Department (HOSPITAL_COMMUNITY): Payer: Medicare Other

## 2012-07-18 DIAGNOSIS — E1159 Type 2 diabetes mellitus with other circulatory complications: Secondary | ICD-10-CM | POA: Diagnosis present

## 2012-07-18 DIAGNOSIS — I509 Heart failure, unspecified: Secondary | ICD-10-CM | POA: Diagnosis present

## 2012-07-18 DIAGNOSIS — E11621 Type 2 diabetes mellitus with foot ulcer: Secondary | ICD-10-CM | POA: Diagnosis present

## 2012-07-18 DIAGNOSIS — I959 Hypotension, unspecified: Secondary | ICD-10-CM | POA: Diagnosis present

## 2012-07-18 DIAGNOSIS — I5022 Chronic systolic (congestive) heart failure: Secondary | ICD-10-CM

## 2012-07-18 DIAGNOSIS — I2510 Atherosclerotic heart disease of native coronary artery without angina pectoris: Secondary | ICD-10-CM | POA: Diagnosis present

## 2012-07-18 DIAGNOSIS — D649 Anemia, unspecified: Secondary | ICD-10-CM

## 2012-07-18 DIAGNOSIS — E871 Hypo-osmolality and hyponatremia: Secondary | ICD-10-CM | POA: Diagnosis present

## 2012-07-18 DIAGNOSIS — E039 Hypothyroidism, unspecified: Secondary | ICD-10-CM | POA: Diagnosis present

## 2012-07-18 DIAGNOSIS — Z95810 Presence of automatic (implantable) cardiac defibrillator: Secondary | ICD-10-CM

## 2012-07-18 DIAGNOSIS — L97429 Non-pressure chronic ulcer of left heel and midfoot with unspecified severity: Secondary | ICD-10-CM | POA: Diagnosis present

## 2012-07-18 DIAGNOSIS — I499 Cardiac arrhythmia, unspecified: Secondary | ICD-10-CM

## 2012-07-18 DIAGNOSIS — D631 Anemia in chronic kidney disease: Secondary | ICD-10-CM | POA: Diagnosis present

## 2012-07-18 DIAGNOSIS — Z794 Long term (current) use of insulin: Secondary | ICD-10-CM

## 2012-07-18 DIAGNOSIS — I480 Paroxysmal atrial fibrillation: Secondary | ICD-10-CM | POA: Diagnosis present

## 2012-07-18 DIAGNOSIS — Z4502 Encounter for adjustment and management of automatic implantable cardiac defibrillator: Secondary | ICD-10-CM

## 2012-07-18 DIAGNOSIS — R079 Chest pain, unspecified: Secondary | ICD-10-CM | POA: Diagnosis present

## 2012-07-18 DIAGNOSIS — E876 Hypokalemia: Secondary | ICD-10-CM | POA: Diagnosis present

## 2012-07-18 DIAGNOSIS — I252 Old myocardial infarction: Secondary | ICD-10-CM

## 2012-07-18 DIAGNOSIS — Z79899 Other long term (current) drug therapy: Secondary | ICD-10-CM

## 2012-07-18 DIAGNOSIS — A419 Sepsis, unspecified organism: Secondary | ICD-10-CM

## 2012-07-18 DIAGNOSIS — L98499 Non-pressure chronic ulcer of skin of other sites with unspecified severity: Secondary | ICD-10-CM | POA: Diagnosis present

## 2012-07-18 DIAGNOSIS — N179 Acute kidney failure, unspecified: Secondary | ICD-10-CM

## 2012-07-18 DIAGNOSIS — I255 Ischemic cardiomyopathy: Secondary | ICD-10-CM | POA: Diagnosis present

## 2012-07-18 DIAGNOSIS — D72829 Elevated white blood cell count, unspecified: Secondary | ICD-10-CM | POA: Diagnosis present

## 2012-07-18 DIAGNOSIS — J449 Chronic obstructive pulmonary disease, unspecified: Secondary | ICD-10-CM | POA: Diagnosis present

## 2012-07-18 DIAGNOSIS — I4891 Unspecified atrial fibrillation: Secondary | ICD-10-CM | POA: Diagnosis present

## 2012-07-18 DIAGNOSIS — I739 Peripheral vascular disease, unspecified: Secondary | ICD-10-CM | POA: Diagnosis present

## 2012-07-18 DIAGNOSIS — L089 Local infection of the skin and subcutaneous tissue, unspecified: Secondary | ICD-10-CM

## 2012-07-18 DIAGNOSIS — N184 Chronic kidney disease, stage 4 (severe): Secondary | ICD-10-CM

## 2012-07-18 DIAGNOSIS — I447 Left bundle-branch block, unspecified: Secondary | ICD-10-CM | POA: Diagnosis present

## 2012-07-18 DIAGNOSIS — E1169 Type 2 diabetes mellitus with other specified complication: Secondary | ICD-10-CM | POA: Diagnosis present

## 2012-07-18 DIAGNOSIS — D638 Anemia in other chronic diseases classified elsewhere: Secondary | ICD-10-CM

## 2012-07-18 DIAGNOSIS — J4489 Other specified chronic obstructive pulmonary disease: Secondary | ICD-10-CM | POA: Diagnosis present

## 2012-07-18 DIAGNOSIS — Z87891 Personal history of nicotine dependence: Secondary | ICD-10-CM

## 2012-07-18 DIAGNOSIS — I2589 Other forms of chronic ischemic heart disease: Secondary | ICD-10-CM | POA: Diagnosis present

## 2012-07-18 DIAGNOSIS — I96 Gangrene, not elsewhere classified: Secondary | ICD-10-CM | POA: Diagnosis present

## 2012-07-18 DIAGNOSIS — Z7901 Long term (current) use of anticoagulants: Secondary | ICD-10-CM

## 2012-07-18 DIAGNOSIS — L97409 Non-pressure chronic ulcer of unspecified heel and midfoot with unspecified severity: Secondary | ICD-10-CM | POA: Diagnosis present

## 2012-07-18 DIAGNOSIS — Z66 Do not resuscitate: Secondary | ICD-10-CM | POA: Diagnosis present

## 2012-07-18 DIAGNOSIS — E11628 Type 2 diabetes mellitus with other skin complications: Secondary | ICD-10-CM

## 2012-07-18 DIAGNOSIS — M869 Osteomyelitis, unspecified: Secondary | ICD-10-CM

## 2012-07-18 DIAGNOSIS — L03039 Cellulitis of unspecified toe: Secondary | ICD-10-CM | POA: Diagnosis present

## 2012-07-18 DIAGNOSIS — Z951 Presence of aortocoronary bypass graft: Secondary | ICD-10-CM

## 2012-07-18 DIAGNOSIS — N189 Chronic kidney disease, unspecified: Secondary | ICD-10-CM

## 2012-07-18 DIAGNOSIS — I482 Chronic atrial fibrillation, unspecified: Secondary | ICD-10-CM

## 2012-07-18 DIAGNOSIS — E119 Type 2 diabetes mellitus without complications: Secondary | ICD-10-CM

## 2012-07-18 DIAGNOSIS — I5042 Chronic combined systolic (congestive) and diastolic (congestive) heart failure: Secondary | ICD-10-CM | POA: Diagnosis present

## 2012-07-18 DIAGNOSIS — L02619 Cutaneous abscess of unspecified foot: Secondary | ICD-10-CM | POA: Diagnosis present

## 2012-07-18 LAB — BASIC METABOLIC PANEL
BUN: 75 mg/dL — ABNORMAL HIGH (ref 6–23)
CO2: 27 mEq/L (ref 19–32)
Calcium: 9.5 mg/dL (ref 8.4–10.5)
Chloride: 88 mEq/L — ABNORMAL LOW (ref 96–112)
Creatinine, Ser: 2.54 mg/dL — ABNORMAL HIGH (ref 0.50–1.35)
GFR calc Af Amer: 27 mL/min — ABNORMAL LOW (ref >90)
GFR calc non Af Amer: 23 mL/min — ABNORMAL LOW (ref >90)
Glucose, Bld: 202 mg/dL — ABNORMAL HIGH (ref 70–99)
Potassium: 3.6 mEq/L (ref 3.5–5.1)
Sodium: 130 mEq/L — ABNORMAL LOW (ref 135–145)

## 2012-07-18 LAB — URINE MICROSCOPIC-ADD ON

## 2012-07-18 LAB — URINALYSIS, ROUTINE W REFLEX MICROSCOPIC
Bilirubin Urine: NEGATIVE
Glucose, UA: NEGATIVE mg/dL
Ketones, ur: NEGATIVE mg/dL
Leukocytes, UA: NEGATIVE
Nitrite: NEGATIVE
Specific Gravity, Urine: 1.015 (ref 1.005–1.030)
Urobilinogen, UA: 0.2 mg/dL (ref 0.0–1.0)
pH: 6 (ref 5.0–8.0)

## 2012-07-18 LAB — CBC
HCT: 34.9 % — ABNORMAL LOW (ref 39.0–52.0)
Hemoglobin: 11.4 g/dL — ABNORMAL LOW (ref 13.0–17.0)
MCH: 28.3 pg (ref 26.0–34.0)
MCHC: 32.7 g/dL (ref 30.0–36.0)
MCV: 86.6 fL (ref 78.0–100.0)
Platelets: 196 10*3/uL (ref 150–400)
RBC: 4.03 MIL/uL — ABNORMAL LOW (ref 4.22–5.81)
RDW: 17 % — ABNORMAL HIGH (ref 11.5–15.5)
WBC: 23.8 10*3/uL — ABNORMAL HIGH (ref 4.0–10.5)

## 2012-07-18 LAB — PROTIME-INR
INR: 2.46 — ABNORMAL HIGH (ref 0.00–1.49)
Prothrombin Time: 27.1 seconds — ABNORMAL HIGH (ref 11.6–15.2)

## 2012-07-18 LAB — PROCALCITONIN: Procalcitonin: 3.31 ng/mL

## 2012-07-18 LAB — MAGNESIUM: Magnesium: 2.1 mg/dL (ref 1.5–2.5)

## 2012-07-18 LAB — LACTIC ACID, PLASMA: Lactic Acid, Venous: 2.2 mmol/L (ref 0.5–2.2)

## 2012-07-18 LAB — TROPONIN I: Troponin I: <0.3 ng/mL (ref <0.30)

## 2012-07-18 MED ORDER — INSULIN GLARGINE 100 UNIT/ML ~~LOC~~ SOLN
10.0000 [IU] | Freq: Two times a day (BID) | SUBCUTANEOUS | Status: DC
  Administered 2012-07-19 (×2): 10 [IU] via SUBCUTANEOUS

## 2012-07-18 MED ORDER — LEVOTHYROXINE SODIUM 50 MCG PO TABS
50.0000 ug | ORAL_TABLET | Freq: Every day | ORAL | Status: DC
  Administered 2012-07-19 – 2012-07-23 (×5): 50 ug via ORAL
  Filled 2012-07-18 (×5): qty 1

## 2012-07-18 MED ORDER — SODIUM CHLORIDE 0.9 % IV SOLN
INTRAVENOUS | Status: DC
  Administered 2012-07-18: 20:00:00 via INTRAVENOUS

## 2012-07-18 MED ORDER — MORPHINE SULFATE 2 MG/ML IJ SOLN
1.0000 mg | INTRAMUSCULAR | Status: DC | PRN
  Administered 2012-07-19 – 2012-07-21 (×7): 1 mg via INTRAVENOUS
  Filled 2012-07-18 (×7): qty 1

## 2012-07-18 MED ORDER — SODIUM CHLORIDE 0.9 % IV SOLN
INTRAVENOUS | Status: AC
  Administered 2012-07-19: 02:00:00 via INTRAVENOUS

## 2012-07-18 MED ORDER — SIMVASTATIN 20 MG PO TABS
40.0000 mg | ORAL_TABLET | Freq: Every day | ORAL | Status: DC
  Administered 2012-07-19 – 2012-07-22 (×5): 40 mg via ORAL
  Filled 2012-07-18 (×5): qty 2

## 2012-07-18 MED ORDER — INSULIN ASPART 100 UNIT/ML ~~LOC~~ SOLN: 10.0000 [IU] | Freq: Three times a day (TID) | SUBCUTANEOUS | Status: DC

## 2012-07-18 MED ORDER — VANCOMYCIN HCL IN DEXTROSE 1-5 GM/200ML-% IV SOLN
1000.0000 mg | Freq: Once | INTRAVENOUS | Status: AC
  Administered 2012-07-19: 1000 mg via INTRAVENOUS
  Filled 2012-07-18: qty 200

## 2012-07-18 MED ORDER — WARFARIN SODIUM 5 MG PO TABS
5.0000 mg | ORAL_TABLET | ORAL | Status: DC
  Administered 2012-07-19 – 2012-07-21 (×3): 5 mg via ORAL
  Filled 2012-07-18 (×3): qty 1

## 2012-07-18 MED ORDER — ONDANSETRON HCL 4 MG/2ML IJ SOLN: 4.0000 mg | Freq: Four times a day (QID) | INTRAMUSCULAR | Status: DC | PRN

## 2012-07-18 MED ORDER — HYDROCODONE-ACETAMINOPHEN 5-325 MG PO TABS
1.0000 | ORAL_TABLET | ORAL | Status: DC | PRN
  Administered 2012-07-19 – 2012-07-20 (×2): 2 via ORAL
  Filled 2012-07-18 (×2): qty 2

## 2012-07-18 MED ORDER — ALLOPURINOL 100 MG PO TABS
100.0000 mg | ORAL_TABLET | Freq: Every day | ORAL | Status: DC
  Administered 2012-07-19 – 2012-07-23 (×5): 100 mg via ORAL
  Filled 2012-07-18 (×5): qty 1

## 2012-07-18 MED ORDER — SODIUM CHLORIDE 0.9 % IV SOLN: INTRAVENOUS | Status: DC

## 2012-07-18 MED ORDER — SODIUM CHLORIDE 0.9 % IV BOLUS (SEPSIS)
500.0000 mL | Freq: Once | INTRAVENOUS | Status: AC
  Administered 2012-07-18: 500 mL via INTRAVENOUS

## 2012-07-18 MED ORDER — SODIUM CHLORIDE 0.9 % IJ SOLN
3.0000 mL | Freq: Two times a day (BID) | INTRAMUSCULAR | Status: DC
  Administered 2012-07-19 – 2012-07-23 (×7): 3 mL via INTRAVENOUS
  Filled 2012-07-18: qty 3
  Filled 2012-07-18: qty 33
  Filled 2012-07-18 (×4): qty 3

## 2012-07-18 MED ORDER — PIPERACILLIN-TAZOBACTAM 3.375 G IVPB
3.3750 g | Freq: Once | INTRAVENOUS | Status: AC
  Administered 2012-07-18: 3.375 g via INTRAVENOUS
  Filled 2012-07-18: qty 50

## 2012-07-18 MED ORDER — ONDANSETRON HCL 4 MG PO TABS: 4.0000 mg | ORAL_TABLET | Freq: Four times a day (QID) | ORAL | Status: DC | PRN

## 2012-07-18 MED ORDER — ONDANSETRON HCL 4 MG/2ML IJ SOLN: 4.0000 mg | Freq: Three times a day (TID) | INTRAMUSCULAR | Status: AC | PRN

## 2012-07-18 NOTE — ED Notes (Signed)
edp in with pt 

## 2012-07-18 NOTE — ED Notes (Signed)
Lab in with pt 

## 2012-07-18 NOTE — ED Notes (Addendum)
Dr. David in with pt 

## 2012-07-18 NOTE — ED Notes (Signed)
Pt was told to come here for defibrillator firing this am. Pt was seen by pcp yesterday and had low vital signs per family. Pt fell Tuesday face first and had to get stitches and has been c/o chest pain since fall.

## 2012-07-18 NOTE — ED Provider Notes (Signed)
History    76yM presenting because AICD fired.  Significant cardiac hx. CAD s/p CABG, systolic/diastolic HF, ischemic cardiomyopathy, Afib. Also CKD, severe PAD, DM. Pt says he doesn't remember it and happened while he was sleeping. Recent admit for same. Family says cardiologist called them and told to bring to ER. Pt followed by [facility]. Pt c/o fatigue.. C/o mild discomfort at site of AICD pocket but this it not new. Otherwise no CP. No SOB. No abdominal pain. No n/v. Chronic LE wounds. No urinary complaints. Reports compliance with meds. Per family, pt seems depressed. No appetite. Weight loss. Does not engage them much.   CSN: [id number]  Arrival date & time 07/18/12  1737   First MD Initiated Contact with Patient 07/18/12 1838      Chief Complaint  Patient presents with  . Hypotension  . Chest Pain    (Consider location/radiation/quality/duration/timing/severity/associated sxs/prior treatment) HPI  Past Medical History  Diagnosis Date  . Shortness of breath   . Anemia   . Chronic kidney disease     renal insufficiency  . CHF (congestive heart failure)   . Dysrhythmia     atrial fibrilation  . Myocardial infarction   . COPD (chronic obstructive pulmonary disease)   . Diabetes mellitus   . Coronary artery disease   . Gout 05/28/2012  . Paroxysmal atrial fibrillation 11/05/2011    permanent   . Hypothyroidism 05/28/2012  . ICD (implantable cardiac defibrillator) discharge 06/11/2012  . Acute on chronic renal failure 06/11/2012  . ICD (implantable cardiac defibrillator) in place   . Chronic systolic CHF (congestive heart failure) 06/11/2012    Past Surgical History  Procedure Date  . Coronary artery bypass graft   . Cardiac catheterization     No family history on file.  History  Substance Use Topics  . Smoking status: Former Smoker    Quit date: 11/05/2003  . Smokeless tobacco: Never Used  . Alcohol Use: No      Review of Systems   Review of symptoms negative  unless otherwise noted in HPI.   Allergies  Review of patient's allergies indicates no known allergies.  Home Medications   Current Outpatient Rx  Name Route Sig Dispense Refill  . ALLOPURINOL 100 MG PO TABS Oral Take 1 tablet (100 mg total) by mouth daily. 30 tablet 5  . CARVEDILOL 25 MG PO TABS Oral Take 1 tablet (25 mg total) by mouth 2 (two) times daily with a meal. 60 tablet 5  . FUROSEMIDE 80 MG PO TABS Oral Take 80 mg by mouth 2 (two) times daily.    Marland Kitchen HYDRALAZINE HCL 25 MG PO TABS Oral Take 25 mg by mouth 3 (three) times daily.    . INSULIN ASPART 100 UNIT/ML [place] SOLN Subcutaneous Inject 10-15 Units into the skin 3 (three) times daily before meals.     . INSULIN GLARGINE 100 UNIT/ML [place] SOLN Subcutaneous Inject 10 Units into the skin 2 (two) times daily. 10 mL 10  . ISOSORBIDE DINITRATE 20 MG PO TABS Oral Take 20 mg by mouth 3 (three) times daily.    Marland Kitchen LEVOTHYROXINE SODIUM 50 MCG PO TABS Oral Take 50 mcg by mouth every morning.     Marland Kitchen METOLAZONE 2.5 MG PO TABS Oral Take 2.5 mg by mouth daily. States he takes with his furosemide every day except on Tuesday and Friday    . OMEPRAZOLE 20 MG PO CPDR Oral Take 40 mg by mouth every morning.     Marland Kitchen SIMVASTATIN  40 MG PO TABS Oral Take 40 mg by mouth at bedtime.     . WARFARIN SODIUM 5 MG PO TABS Oral Take 2.5-5 mg by mouth every evening. Takes one tablet (5mg ) every day except on Tuesdays and Fridays, take one-half tablet (2.5mg )      BP 103/58  Pulse 73  Temp 99.3 F (37.4 C)  Resp 18  Ht 6\' 2"  (1.88 m)  Wt 210 lb (95.255 kg)  BMI 26.96 kg/m2  SpO2 100%  Physical Exam  Nursing note and vitals reviewed. Constitutional: He is oriented to person, place, and time. He appears well-developed and well-nourished.       Laying in bed. Tired   HENT:  Head: Normocephalic and atraumatic.  Eyes: Conjunctivae are normal. Right eye exhibits no discharge. Left eye exhibits no discharge.  Neck: Neck supple.  Cardiovascular: Normal rate, regular  rhythm and normal heart sounds.  Exam reveals no gallop and no friction rub.   No murmur heard.      AICD L chest.   Pulmonary/Chest: Effort normal and breath sounds normal. No respiratory distress.  Abdominal: Soft. He exhibits no distension. There is no tenderness.  Musculoskeletal: He exhibits no edema and no tenderness.       Diffuse swelling of middle toe R foot. Ulceration distal aspect. No drainage but probing of wound with tracking to bone.   Neurological: He is alert and oriented to person, place, and time.  Skin: Skin is warm and dry.  Psychiatric: He has a normal mood and affect. His behavior is normal. Thought content normal.    ED Course  Procedures (including critical care time)  CRITICAL CARE Performed by: Raeford Razor   Total critical care time: 40 minutes  Critical care time was exclusive of separately billable procedures and treating other patients.  Critical care was necessary to treat or prevent imminent or life-threatening deterioration.  Critical care was time spent personally by me on the following activities: development of treatment plan with patient and/or surrogate as well as nursing, discussions with consultants, evaluation of patient's response to treatment, examination of patient, obtaining history from patient or surrogate, ordering and performing treatments and interventions, ordering and review of laboratory studies, ordering and review of radiographic studies, pulse oximetry and re-evaluation of patient's condition.   Labs Reviewed  CBC - Abnormal; Notable for the following:    WBC 23.8 (*)     RBC 4.03 (*)     Hemoglobin 11.4 (*)     HCT 34.9 (*)     RDW 17.0 (*)     All other components within normal limits  BASIC METABOLIC PANEL - Abnormal; Notable for the following:    Sodium 130 (*)     Chloride 88 (*)     Glucose, Bld 202 (*)     BUN 75 (*)     Creatinine, Ser 2.54 (*)     GFR calc non Af Amer 23 (*)     GFR calc Af Amer 27 (*)      All other components within normal limits  PROTIME-INR - Abnormal; Notable for the following:    Prothrombin Time 27.1 (*)     INR 2.46 (*)     All other components within normal limits  TROPONIN I  MAGNESIUM  CULTURE, BLOOD (ROUTINE X 2)  CULTURE, BLOOD (ROUTINE X 2)  LACTIC ACID, PLASMA  PROCALCITONIN  URINALYSIS, ROUTINE W REFLEX MICROSCOPIC   Chest Portable 1 View  07/18/2012  *RADIOLOGY REPORT*  Clinical  Data: Hypertension.  Chest pain.  PORTABLE CHEST - 1 VIEW  Comparison: 06/11/2012  Findings: Left-sided AICD has leads overlying the right atrium and right ventricle.  Heart is enlarged.  There is mild pulmonary vascular congestion but no overt edema.  No focal consolidations or pleural effusions are seen.  Patient has had median sternotomy and CABG.  IMPRESSION:  1.  Cardiomegaly and vascular congestion. 2.  No focal consolidations.  Original Report Authenticated By: Patterson Hammersmith, M.D.   EKG:  Rhythm: afib w/ PVC Rate: 78 Axis: Left Intervals: NS intraventricular delay. Poor r wave progression. ST segments: ST depression laterally and inferiorly noted, but seem on previous   1. Sepsis   2. Osteomyelitis of toe of right foot   3. AICD discharge   4. Acute on chronic renal failure    MDM  76yM s/p AICD firing this am. Per family, hypotensive on outpt visit yesterday and mild hypotension today in ED.  Given hypotension and symptoms of generalized weakness consider sepsis. LE wounds possible source if so. Afebrile in triage but felt warm on exam. Will check rectal temp.  Given AICD fired, will check lytes and trop. On coumadin, INR. Infectious eval. Will discuss with cards.  8:48 PM Clinical picture and w/u consistent with sepsis. Likely precipitating event for AICD firing. EKG stable from previous. Lytes fine. Possible source is LE wounds. Clinically pt has osteomyelitis of his R middle toe with deep tracking of wound to bone with probing. Zosyn ordered. Pt has been  previously been evaluated by Dr Lajoyce Corners, ortho. Pt's BP ok at this time and given cardiac hx will be gentle with IVF.         Raeford Razor, MD 07/18/12 [id number]

## 2012-07-18 NOTE — H&P (Signed)
PCP:   Colette Ribas, MD   Chief Complaint:  Told to come in by cardiology office for firing aicd  HPI: 77 yo male with MMP sent in by cards office for firing of aicd that pt did not feel.  Pt has been sick for several weeks with decrease po intake.  He spoke dr berry last Monday (over a week ago) and was noted to have a new wound on his rt middle toe that was developing and he was told to see his foot doctor.  He saw dr Lajoyce Corners (ortho) on wed 2 days ago and was diagnosed with probable osteomyelitis and placed on doxycycline po.  Over that week period the wound progressed, the toe became more swollen and started draining pus and more red.  Pt has not been complaining of anything (does not complain at all) but family was called this am by cards office that his aicd went off.  Pt denies feeling this happened.  Pt found to be mildly hypotensive in the ED and febrile with significant leukocytosis and worsening renal failure.  Pt denies any cp/sob/n/v/d/dysuria /cough.  Says his rt 3rd toe has been painful.  Has not moved around much in last 2 weeks and is basically bedbound recently.    Review of Systems:  O/w neg  Past Medical History: Past Medical History  Diagnosis Date  . Shortness of breath   . Anemia   . Chronic kidney disease     renal insufficiency  . CHF (congestive heart failure)   . Dysrhythmia     atrial fibrilation  . Myocardial infarction   . COPD (chronic obstructive pulmonary disease)   . Diabetes mellitus   . Coronary artery disease   . Gout 05/28/2012  . Paroxysmal atrial fibrillation 11/05/2011    permanent   . Hypothyroidism 05/28/2012  . ICD (implantable cardiac defibrillator) discharge 06/11/2012  . Acute on chronic renal failure 06/11/2012  . ICD (implantable cardiac defibrillator) in place   . Chronic systolic CHF (congestive heart failure) 06/11/2012   Past Surgical History  Procedure Date  . Coronary artery bypass graft   . Cardiac catheterization      Medications: Prior to Admission medications   Medication Sig Start Date End Date Taking? Authorizing Provider  allopurinol (ZYLOPRIM) 100 MG tablet Take 1 tablet (100 mg total) by mouth daily. 06/18/12 06/18/13 Yes Wilburt Finlay, PA  carvedilol (COREG) 25 MG tablet Take 1 tablet (25 mg total) by mouth 2 (two) times daily with a meal. 06/18/12 06/18/13 Yes Wilburt Finlay, PA  doxycycline (VIBRA-TABS) 100 MG tablet Take 100 mg by mouth 2 (two) times daily.   Yes Historical Provider, MD  furosemide (LASIX) 80 MG tablet Take 80 mg by mouth 2 (two) times daily.   Yes Historical Provider, MD  hydrALAZINE (APRESOLINE) 25 MG tablet Take 25 mg by mouth 3 (three) times daily.   Yes Historical Provider, MD  insulin aspart (NOVOLOG) 100 UNIT/ML injection Inject 10-15 Units into the skin 3 (three) times daily before meals.    Yes Historical Provider, MD  insulin glargine (LANTUS) 100 UNIT/ML injection Inject 10 Units into the skin 2 (two) times daily. 06/18/12 06/18/13 Yes Wilburt Finlay, PA  isosorbide dinitrate (ISORDIL) 20 MG tablet Take 20 mg by mouth 3 (three) times daily.   Yes Historical Provider, MD  levothyroxine (SYNTHROID, LEVOTHROID) 50 MCG tablet Take 50 mcg by mouth every morning.    Yes Historical Provider, MD  metolazone (ZAROXOLYN) 2.5 MG tablet Take 2.5 mg by  mouth daily. States he takes with his furosemide every day except on Tuesday and Friday   Yes Historical Provider, MD  omeprazole (PRILOSEC) 20 MG capsule Take 40 mg by mouth every morning.    Yes Historical Provider, MD  simvastatin (ZOCOR) 40 MG tablet Take 40 mg by mouth at bedtime.    Yes Historical Provider, MD  warfarin (COUMADIN) 5 MG tablet Take 2.5-5 mg by mouth every evening. Takes one tablet (5mg ) every day except on Tuesdays and Fridays, take one-half tablet (2.5mg )    Historical Provider, MD    Allergies:  No Known Allergies  Social History:  reports that he quit smoking about 8 years ago. He has never used smokeless tobacco. He  reports that he does not drink alcohol or use illicit drugs.  Physical Exam: Filed Vitals:   07/18/12 1753 07/18/12 1755 07/18/12 1942 07/18/12 2125  BP:  103/58  138/63  Pulse:  73  76  Temp:  99.3 F (37.4 C) 101.5 F (38.6 C)   TempSrc:   Rectal   Resp:  18  18  Height: 6\' 2"  (1.88 m)     Weight: 95.255 kg (210 lb)     SpO2:  100%  95%   General appearance: alert, cooperative and no distress Lungs: clear to auscultation bilaterally Heart: regular rate and rhythm, S1, S2 normal, no murmur, click, rub or gallop Abdomen: soft, non-tender; bowel sounds normal; no masses,  no organomegaly Extremities: rt 3rd toe erythematous swollen with open wound on dorsal surface with purulent drainage Pulses: 2+ and symmetric Skin: Skin color, texture, turgor normal. No rashes or lesions Neurologic: Grossly normal    Labs on Admission:   [employer] 07/18/12 1916 07/18/12 1826  NA -- 130*  K -- 3.6  CL -- 88*  CO2 -- 27  GLUCOSE -- 202*  BUN -- 75*  CREATININE -- 2.54*  CALCIUM -- 9.5  MG 2.1 --  PHOS -- --    Basename 07/18/12 1826  WBC 23.8*  NEUTROABS --  HGB 11.4*  HCT 34.9*  MCV 86.6  PLT 196    Basename 07/18/12 1826  CKTOTAL --  CKMB --  CKMBINDEX --  TROPONINI <0.30   Radiological Exams on Admission: Ct Head Wo Contrast  07/15/2012  *RADIOLOGY REPORT*  Clinical Data: Fall.  Laceration above left eye  CT HEAD WITHOUT CONTRAST  Technique:  Contiguous axial images were obtained from the base of the skull through the vertex without contrast.  Comparison: CT 05/21/2008  Findings: Generalized atrophy has progressed.  Chronic microvascular ischemia in the white matter has progressed.  No acute infarct or mass.  No intracranial hemorrhage.  Soft tissue swelling and laceration over the left eye.  No fracture is present.  IMPRESSION: Atrophy and chronic microvascular ischemic changes have progressed since 2009.  No acute intracranial abnormality.  Original Report Authenticated  By: Camelia Phenes, M.D.   Chest Portable 1 View  07/18/2012  *RADIOLOGY REPORT*  Clinical Data: Hypertension.  Chest pain.  PORTABLE CHEST - 1 VIEW  Comparison: 06/11/2012  Findings: Left-sided AICD has leads overlying the right atrium and right ventricle.  Heart is enlarged.  There is mild pulmonary vascular congestion but no overt edema.  No focal consolidations or pleural effusions are seen.  Patient has had median sternotomy and CABG.  IMPRESSION:  1.  Cardiomegaly and vascular congestion. 2.  No focal consolidations.  Original Report Authenticated By: Patterson Hammersmith, M.D.    Assessment/Plan Present on Admission:  76  yo male with diabetic foot infection likely osteomyelitis and early sepsis who had aicd fire this am likely due to hypotension/arrthymia .ICD (implantable cardiac defibrillator) in place .Diabetic foot infection, just discharged 05/30/12 .Ischemic cardiomyopathy, EF 25% 2D 12/12 .PVD, LLE PVA 06/13/12- for LLE PTA 7/16 .Acute on chronic renal failure, SCr 2.3-3.3 secondary to dehydration .Anemia due to chronic illness .Chronic a-fib .Chronic systolic CHF (congestive heart failure) .CKD (chronic kidney disease) stage 4, GFR 15-29 ml/min .Diabetes mellitus type 2, insulin dependent  Place on iv vanc/zosyn.  Blood cultures.  Ortho consult.  Pt wishes to be DNR, no cpr or intubation in future under no circumstances if needed.  Agrees to iv fluids and iv abx at this time.  Will hold all bp meds and diuretics at this time.  Place on tele.  High risk for needing amputation and also high risk for surgical intervention.  Code status discussed with pt and his only dtr and granddtr.  All aware he wishes DNR.  Saxon Barich A [phone] 07/18/2012, 9:35 PM

## 2012-07-19 ENCOUNTER — Encounter (HOSPITAL_COMMUNITY): Payer: Self-pay | Admitting: *Deleted

## 2012-07-19 ENCOUNTER — Inpatient Hospital Stay (HOSPITAL_COMMUNITY): Payer: Medicare Other

## 2012-07-19 DIAGNOSIS — I499 Cardiac arrhythmia, unspecified: Secondary | ICD-10-CM | POA: Insufficient documentation

## 2012-07-19 DIAGNOSIS — Z0389 Encounter for observation for other suspected diseases and conditions ruled out: Secondary | ICD-10-CM

## 2012-07-19 DIAGNOSIS — N184 Chronic kidney disease, stage 4 (severe): Secondary | ICD-10-CM

## 2012-07-19 LAB — GLUCOSE, CAPILLARY
Glucose-Capillary: 176 mg/dL — ABNORMAL HIGH (ref 70–99)
Glucose-Capillary: 184 mg/dL — ABNORMAL HIGH (ref 70–99)
Glucose-Capillary: 221 mg/dL — ABNORMAL HIGH (ref 70–99)
Glucose-Capillary: 254 mg/dL — ABNORMAL HIGH (ref 70–99)
Glucose-Capillary: 262 mg/dL — ABNORMAL HIGH (ref 70–99)

## 2012-07-19 LAB — CBC
MCH: 28.2 pg (ref 26.0–34.0)
MCHC: 32.6 g/dL (ref 30.0–36.0)
RDW: 16.9 % — ABNORMAL HIGH (ref 11.5–15.5)

## 2012-07-19 LAB — BASIC METABOLIC PANEL
BUN: 71 mg/dL — ABNORMAL HIGH (ref 6–23)
Calcium: 9.2 mg/dL (ref 8.4–10.5)
Creatinine, Ser: 2.31 mg/dL — ABNORMAL HIGH (ref 0.50–1.35)
GFR calc Af Amer: 30 mL/min — ABNORMAL LOW (ref >90)
GFR calc non Af Amer: 26 mL/min — ABNORMAL LOW (ref >90)

## 2012-07-19 LAB — PROTIME-INR
INR: 2.45 — ABNORMAL HIGH (ref 0.00–1.49)
Prothrombin Time: 27 seconds — ABNORMAL HIGH (ref 11.6–15.2)

## 2012-07-19 MED ORDER — CARVEDILOL 12.5 MG PO TABS: 25.0000 mg | ORAL_TABLET | Freq: Two times a day (BID) | ORAL | Status: DC

## 2012-07-19 MED ORDER — PIPERACILLIN-TAZOBACTAM 3.375 G IVPB
3.3750 g | Freq: Three times a day (TID) | INTRAVENOUS | Status: DC
  Administered 2012-07-19 – 2012-07-22 (×9): 3.375 g via INTRAVENOUS
  Filled 2012-07-19 (×10): qty 50

## 2012-07-19 MED ORDER — PIPERACILLIN-TAZOBACTAM 3.375 G IVPB
INTRAVENOUS | Status: AC
  Filled 2012-07-19: qty 50

## 2012-07-19 MED ORDER — INSULIN GLARGINE 100 UNIT/ML ~~LOC~~ SOLN
20.0000 [IU] | Freq: Two times a day (BID) | SUBCUTANEOUS | Status: DC
  Administered 2012-07-19 – 2012-07-21 (×4): 20 [IU] via SUBCUTANEOUS

## 2012-07-19 MED ORDER — PIPERACILLIN-TAZOBACTAM 3.375 G IVPB
3.3750 g | Freq: Once | INTRAVENOUS | Status: AC
  Administered 2012-07-19: 3.375 g via INTRAVENOUS
  Filled 2012-07-19: qty 50

## 2012-07-19 MED ORDER — BIOTENE DRY MOUTH MT LIQD
15.0000 mL | Freq: Two times a day (BID) | OROMUCOSAL | Status: DC
  Administered 2012-07-19 – 2012-07-22 (×7): 15 mL via OROMUCOSAL

## 2012-07-19 MED ORDER — WARFARIN - PHYSICIAN DOSING INPATIENT
Freq: Every day | Status: DC
  Administered 2012-07-19 – 2012-07-21 (×2)

## 2012-07-19 MED ORDER — INSULIN ASPART 100 UNIT/ML ~~LOC~~ SOLN
0.0000 [IU] | Freq: Every day | SUBCUTANEOUS | Status: DC
  Administered 2012-07-19: 3 [IU] via SUBCUTANEOUS
  Administered 2012-07-20 – 2012-07-22 (×3): 2 [IU] via SUBCUTANEOUS

## 2012-07-19 MED ORDER — WARFARIN SODIUM 2.5 MG PO TABS
2.5000 mg | ORAL_TABLET | ORAL | Status: DC
Start: 2012-07-22 — End: 2012-07-23
  Administered 2012-07-22: 2.5 mg via ORAL
  Filled 2012-07-19: qty 1

## 2012-07-19 MED ORDER — INSULIN GLARGINE 100 UNIT/ML ~~LOC~~ SOLN: 20.0000 [IU] | Freq: Two times a day (BID) | SUBCUTANEOUS | Status: DC

## 2012-07-19 MED ORDER — ACETAMINOPHEN 500 MG PO TABS
500.0000 mg | ORAL_TABLET | Freq: Four times a day (QID) | ORAL | Status: DC | PRN
  Administered 2012-07-19: 500 mg via ORAL

## 2012-07-19 MED ORDER — SODIUM CHLORIDE 0.9 % IV SOLN
1500.0000 mg | INTRAVENOUS | Status: DC
  Administered 2012-07-19 – 2012-07-21 (×3): 1500 mg via INTRAVENOUS
  Filled 2012-07-19 (×4): qty 1500

## 2012-07-19 MED ORDER — INSULIN ASPART 100 UNIT/ML ~~LOC~~ SOLN
0.0000 [IU] | Freq: Three times a day (TID) | SUBCUTANEOUS | Status: DC
  Administered 2012-07-19: 8 [IU] via SUBCUTANEOUS
  Administered 2012-07-19: 3 [IU] via SUBCUTANEOUS
  Administered 2012-07-19 – 2012-07-20 (×2): 5 [IU] via SUBCUTANEOUS
  Administered 2012-07-20: 2 [IU] via SUBCUTANEOUS
  Administered 2012-07-20: 8 [IU] via SUBCUTANEOUS
  Administered 2012-07-21 (×2): 3 [IU] via SUBCUTANEOUS
  Administered 2012-07-21 – 2012-07-22 (×3): 5 [IU] via SUBCUTANEOUS
  Administered 2012-07-23 (×2): 3 [IU] via SUBCUTANEOUS

## 2012-07-19 MED ORDER — INSULIN ASPART 100 UNIT/ML ~~LOC~~ SOLN: 0.0000 [IU] | Freq: Three times a day (TID) | SUBCUTANEOUS | Status: DC

## 2012-07-19 MED ORDER — CARVEDILOL 12.5 MG PO TABS
12.5000 mg | ORAL_TABLET | Freq: Two times a day (BID) | ORAL | Status: DC
  Administered 2012-07-19 – 2012-07-22 (×6): 12.5 mg via ORAL
  Filled 2012-07-19 (×6): qty 1

## 2012-07-19 MED ORDER — VANCOMYCIN HCL IN DEXTROSE 1-5 GM/200ML-% IV SOLN
INTRAVENOUS | Status: AC
  Filled 2012-07-19: qty 200

## 2012-07-19 NOTE — Progress Notes (Signed)
Patients daughter called while nurse at lunch, nurse given number by secretary to call back.  Attempted to call back on 2 numbers listed in chart - but receiving message saying that numbers have been disconnected.

## 2012-07-19 NOTE — Consult Note (Signed)
ANTIBIOTIC CONSULT NOTE - INITIAL  Pharmacy Consult for Vancomycin and Zosyn Indication: cellulitis, diabetic foot infxn, possible osteomyelitis  No Known Allergies  Patient Measurements: Height: 6\' 2"  (188 cm) Weight: 202 lb 11.2 oz (91.944 kg) IBW/kg (Calculated) : 82.2   Vital Signs: Temp: 98.1 F (36.7 C) (08/17 0533) Temp src: Oral (08/17 0533) BP: 146/60 mmHg (08/17 0533) Pulse Rate: 50  (08/17 0825) Intake/Output from previous day: 08/16 0701 - 08/17 0700 In: 1320 [P.O.:120; I.V.:400; IV Piggyback:800] Out: 300 [Urine:300] Intake/Output from this shift: Total I/O In: 360 [P.O.:360] Out: 300 [Urine:300]  Labs:  [facility] 07/19/12 0613 07/18/12 1826  WBC 14.9* 23.8*  HGB 10.3* 11.4*  PLT 184 196  LABCREA -- --  CREATININE 2.31* 2.54*   Estimated Creatinine Clearance: 32.6 ml/min (by C-G formula based on Cr of 2.31). No results found for this basename: VANCOTROUGH:2,VANCOPEAK:2,VANCORANDOM:2,GENTTROUGH:2,GENTPEAK:2,GENTRANDOM:2,TOBRATROUGH:2,TOBRAPEAK:2,TOBRARND:2,AMIKACINPEAK:2,AMIKACINTROU:2,AMIKACIN:2, in the last 72 hours   Microbiology: Recent Results (from the past 720 hour(s))  CULTURE, BLOOD (ROUTINE X 2)     Status: Normal (Preliminary result)   Collection Time   07/18/12  7:16 PM      Component Value Range Status Comment   Specimen Description LEFT ANTECUBITAL   Final    Special Requests BOTTLES DRAWN AEROBIC AND ANAEROBIC 12CC   Final    Culture PENDING   Incomplete    Report Status PENDING   Incomplete   CULTURE, BLOOD (ROUTINE X 2)     Status: Normal (Preliminary result)   Collection Time   07/18/12  7:22 PM      Component Value Range Status Comment   Specimen Description BLOOD RIGHT HAND   Final    Special Requests BOTTLES DRAWN AEROBIC AND ANAEROBIC [facility]   Final    Culture PENDING   Incomplete    Report Status PENDING   Incomplete    Medical History: Past Medical History  Diagnosis Date  . Shortness of breath   . Anemia   . Chronic kidney  disease     renal insufficiency  . CHF (congestive heart failure)   . Dysrhythmia     atrial fibrilation  . Myocardial infarction   . COPD (chronic obstructive pulmonary disease)   . Diabetes mellitus   . Coronary artery disease   . Gout 05/28/2012  . Paroxysmal atrial fibrillation 11/05/2011    permanent   . Hypothyroidism 05/28/2012  . ICD (implantable cardiac defibrillator) discharge 06/11/2012  . Acute on chronic renal failure 06/11/2012  . ICD (implantable cardiac defibrillator) in place   . Chronic systolic CHF (congestive heart failure) 06/11/2012   Medications:  Scheduled:    . allopurinol  100 mg Oral Daily  . antiseptic oral rinse  15 mL Mouth Rinse BID  . carvedilol  25 mg Oral BID WC  . insulin aspart  0-15 Units Subcutaneous TID WC  . insulin aspart  0-5 Units Subcutaneous QHS  . insulin glargine  10 Units Subcutaneous BID  . levothyroxine  50 mcg Oral QAC breakfast  . piperacillin-tazobactam  3.375 g Intravenous Once  . piperacillin-tazobactam (ZOSYN)  IV  3.375 g Intravenous Once  . piperacillin-tazobactam (ZOSYN)  IV  3.375 g Intravenous Q8H  . simvastatin  40 mg Oral QHS  . sodium chloride  500 mL Intravenous Once  . sodium chloride  3 mL Intravenous Q12H  . vancomycin  1,500 mg Intravenous Q24H  . vancomycin  1,000 mg Intravenous Once  . warfarin  2.5 mg Oral Custom  . warfarin  5 mg Oral Custom  .  Warfarin - Physician Dosing Inpatient   Does not apply q1800  . DISCONTD: insulin aspart  0-15 Units Subcutaneous TID WC  . DISCONTD: insulin aspart  10-15 Units Subcutaneous TID AC   Assessment: 76yo with elevated SCr, poor renal fxn admitted with possible osteomyelitis Estimated Creatinine Clearance: 32.6 ml/min (by C-G formula based on Cr of 2.31).  Goal of Therapy:  Vancomycin trough level 15-20 mcg/ml  Plan: Zosyn 3.375gm iv q8hrs Vancomycin 1500mg  iv q24hrs Check trough at steady state Monitor labs, renal fxn, and cultures per protocol  Valrie Hart  A 07/19/2012,9:09 AM

## 2012-07-19 NOTE — Progress Notes (Signed)
Patient encouraged to get OOB to chair to eat lunch this afternoon.  Patient refused - stated he just wanted to sit on side of bed, did not feel like getting up to chair.

## 2012-07-19 NOTE — Progress Notes (Addendum)
[facility] PROGRESS NOTE  [name] YNW:[id number] DOB: 1935-04-22 DOA: 07/18/2012   Assessment/Plan: Patient Active Hospital Problem List: Diabetic foot infection, just discharged 05/30/12 (05/27/2012)/Sepsis: -Bp stable, blood pressure continues to increase. -right Heel ulcer seems like it's tracking to the bone, I have not probed it.  -There is purulent drainage, with malodor , he's currently on Vancomycin and Zosyn started on 07/18/2012, his leukocytosis is improving. He has remained afebrile.  Blood cultures x2. -We'll get an x-ray of the foot to rule out osteomyelitis. Will get wound care to see the patient. X-ray of the foot is negative we'll get an CT of his lower extremity, as he does have an AICD and cannot do an MRI.   Arrhythmia, ventricular (07/19/2012) -possible second degree AV block intermittently. I will go ahead and repeat an EKG. The patient is asymptomatic. -I have discussed with [facility] cardiology, their note from the office is not in the system (from 07/18/2012), as per patient the cardiologist told him his AICD was firing. The patient relates no shocked from AICD. He does have what seems like a possible intermittent second-degree AV block which can be causing his AICD to fire. -On telemetry there have been no events of shock.  Hx of CABG x 6 2003 (11/05/2011) -stable continue aspirin, we'll continue Coreg.  -We'll hold on the diuretics.  ICD (implantable cardiac defibrillator) in place (11/05/2011) -no events recorded on telemetry.  Chronic a-fib (11/05/2011) -On EKG he seems to have be in SR, which makes me think he is not in atrial fibrillation, most likely he is in past paroxysmal atrial fibrillation. I am concerned that his telemetry strip it showed a intermittent second-degree AV block.  Diabetes mellitus type 2, insulin dependent (11/05/2011) -good control continue current treatment  Acute on chronic renal failure, SCr 2.3-3.3 secondary to  dehydration (06/11/2012) -this  mostly secondary to decreased intravascular volume he has hyponatremia hypokalemia and hypochloremia which is compatible with with significant intravascular volume depletion. He does have ischemic cardiomyopathy with an EF, 30% by echo on 11/06/2011. I will start him on IV fluids strict I.'s and O.'s, to make sure he doesn't go to volume overload. Will hold on on his Lasix and ZAroxylyn. We'll check a basic metabolic panel in the morning. We'll also get daily weights.  Anemia due to chronic illness (11/05/2011) -actually his hemoglobin is 10.3, when he came into the hospital was 11.4. There has been no melanotic stools observed. We'll continue to monitor for any kind of bleeding. His hemoglobin on 06/18/2012 was 9.6. My guess is that his hemoglobin continued to drop secondary to hemoconcentration from his decreased intravascular volume. Continue monitor his vitals and his stools for any signs of bleeding.  Ischemic cardiomyopathy, EF 25% 2D 12/12 (11/07/2011): -He has no signs of ischemia. His EKG shows some nonspecific T-wave abnormalities. He is not complaining of any chest pain, shortness of breath, sweating, or palpitations.   PVD, LLE PVA 06/13/12- for LLE PTA 7/16 (11/07/2011) -continue aspirin.  Code Status: DNR Family Communication: Daughter [phone] Disposition Plan: TBD   LOS: 1 day   Procedures:  Foot x-ray  Antibiotics:  Vancomycin and Zosyn started on 07/18/2012   Subjective: Patient relates he feels much better. He is tolerating his diet. His only complaint is his left foot which keeps hurting him.  Objective: Filed Vitals:   07/19/12 0028 07/19/12 0303 07/19/12 0533 07/19/12 0825  BP:   146/60   Pulse: 81  66 50  Temp:   98.1  F (36.7 C)   TempSrc:   Oral   Resp:   20   Height:      Weight:  89.4 kg (197 lb 1.5 oz) 91.944 kg (202 lb 11.2 oz)   SpO2: 96%  95% 98%    Intake/Output Summary (Last 24 hours) at 07/19/12 0855 Last  data filed at 07/19/12 0847  Gross per 24 hour  Intake   1680 ml  Output    600 ml  Net   1080 ml   Weight change:   Exam:  General: Alert, awake, oriented x3, in no acute distress.  HEENT: No bruits, no goiter.  Heart: Regular rate and rhythm, without murmurs, rubs, gallops.  Lungs: Good air movement, bilateral air movement.  Abdomen: Soft, nontender, nondistended, positive bowel sounds.  Extremities: He has 2 ulcers in his right foot 1 in his heel which is erythematous and has some purulent drainage, he also has one on his chin which is erythematous. But no drainage.   Data Reviewed: Basic Metabolic Panel:  Lab 07/19/12 7829 07/18/12 1916 07/18/12 1826  NA 130* -- 130*  K 2.9* -- 3.6  CL 91* -- 88*  CO2 26 -- 27  GLUCOSE 186* -- 202*  BUN 71* -- 75*  CREATININE 2.31* -- 2.54*  CALCIUM 9.2 -- 9.5  MG -- 2.1 --  PHOS -- -- --   Liver Function Tests: No results found for this basename: AST:5,ALT:5,ALKPHOS:5,BILITOT:5,PROT:5,ALBUMIN:5 in the last 168 hours No results found for this basename: LIPASE:5,AMYLASE:5 in the last 168 hours No results found for this basename: AMMONIA:5 in the last 168 hours CBC:  Lab 07/19/12 0613 07/18/12 1826  WBC 14.9* 23.8*  NEUTROABS -- --  HGB 10.3* 11.4*  HCT 31.6* 34.9*  MCV 86.6 86.6  PLT 184 196   Cardiac Enzymes:  Lab 07/18/12 1826  CKTOTAL --  CKMB --  CKMBINDEX --  TROPONINI <0.30   BNP: No components found with this basename: POCBNP:5 CBG:  Lab 07/19/12 0720 07/19/12 0051  GLUCAP 176* 184*    Recent Results (from the past 240 hour(s))  CULTURE, BLOOD (ROUTINE X 2)     Status: Normal (Preliminary result)   Collection Time   07/18/12  7:16 PM      Component Value Range Status Comment   Specimen Description LEFT ANTECUBITAL   Final    Special Requests BOTTLES DRAWN AEROBIC AND ANAEROBIC 12CC   Final    Culture PENDING   Incomplete    Report Status PENDING   Incomplete   CULTURE, BLOOD (ROUTINE X 2)     Status:  Normal (Preliminary result)   Collection Time   07/18/12  7:22 PM      Component Value Range Status Comment   Specimen Description BLOOD RIGHT HAND   Final    Special Requests BOTTLES DRAWN AEROBIC AND ANAEROBIC [facility]   Final    Culture PENDING   Incomplete    Report Status PENDING   Incomplete      Studies: Ct Head Wo Contrast  07/15/2012  *RADIOLOGY REPORT*  Clinical Data: Fall.  Laceration above left eye  CT HEAD WITHOUT CONTRAST  Technique:  Contiguous axial images were obtained from the base of the skull through the vertex without contrast.  Comparison: CT 05/21/2008  Findings: Generalized atrophy has progressed.  Chronic microvascular ischemia in the white matter has progressed.  No acute infarct or mass.  No intracranial hemorrhage.  Soft tissue swelling and laceration over the left eye.  No fracture  is present.  IMPRESSION: Atrophy and chronic microvascular ischemic changes have progressed since 2009.  No acute intracranial abnormality.  Original Report Authenticated By: Camelia Phenes, M.D.   Chest Portable 1 View  07/18/2012  *RADIOLOGY REPORT*  Clinical Data: Hypertension.  Chest pain.  PORTABLE CHEST - 1 VIEW  Comparison: 06/11/2012  Findings: Left-sided AICD has leads overlying the right atrium and right ventricle.  Heart is enlarged.  There is mild pulmonary vascular congestion but no overt edema.  No focal consolidations or pleural effusions are seen.  Patient has had median sternotomy and CABG.  IMPRESSION:  1.  Cardiomegaly and vascular congestion. 2.  No focal consolidations.  Original Report Authenticated By: Patterson Hammersmith, M.D.    Scheduled Meds:    . allopurinol  100 mg Oral Daily  . antiseptic oral rinse  15 mL Mouth Rinse BID  . insulin aspart  0-15 Units Subcutaneous TID WC  . insulin aspart  0-5 Units Subcutaneous QHS  . insulin glargine  10 Units Subcutaneous BID  . levothyroxine  50 mcg Oral QAC breakfast  . piperacillin-tazobactam  3.375 g Intravenous Once  .  piperacillin-tazobactam (ZOSYN)  IV  3.375 g Intravenous Once  . piperacillin-tazobactam (ZOSYN)  IV  3.375 g Intravenous Q8H  . simvastatin  40 mg Oral QHS  . sodium chloride  500 mL Intravenous Once  . sodium chloride  3 mL Intravenous Q12H  . vancomycin  1,500 mg Intravenous Q24H  . vancomycin  1,000 mg Intravenous Once  . warfarin  2.5 mg Oral Custom  . warfarin  5 mg Oral Custom  . Warfarin - Physician Dosing Inpatient   Does not apply q1800  . DISCONTD: insulin aspart  0-15 Units Subcutaneous TID WC  . DISCONTD: insulin aspart  10-15 Units Subcutaneous TID AC   Continuous Infusions:    . sodium chloride 100 mL/hr at 07/19/12 0133  . DISCONTD: sodium chloride 100 mL/hr at 07/18/12 1936  . DISCONTD: sodium chloride      Lambert Keto, MD  [facility] Pager [phone]  If 7PM-7AM, please contact night-coverage [email or web address] Password [facility] 07/19/2012, 8:55 AM

## 2012-07-19 NOTE — Consult Note (Signed)
ANTIBIOTIC CONSULT NOTE-Preliminary  Pharmacy Consult for Vancomycin/Zosyn Indication: Osteomyelitis/ early sepsis  No Known Allergies  Patient Measurements: Height: 6\' 2"  (188 cm) Weight: 197 lb (89.359 kg) IBW/kg (Calculated) : 82.2    Vital Signs: Temp: 99.4 F (37.4 C) (08/16 2325) Temp src: Oral (08/16 2325) BP: 138/76 mmHg (08/16 2325) Pulse Rate: 76  (08/16 2325)  Labs:  [facility] 07/18/12 1826  WBC 23.8*  HGB 11.4*  PLT 196  LABCREA --  CREATININE 2.54*    Estimated Creatinine Clearance: 29.7 ml/min (by C-G formula based on Cr of 2.54).  No results found for this basename: VANCOTROUGH:2,VANCOPEAK:2,VANCORANDOM:2,GENTTROUGH:2,GENTPEAK:2,GENTRANDOM:2,TOBRATROUGH:2,TOBRAPEAK:2,TOBRARND:2,AMIKACINPEAK:2,AMIKACINTROU:2,AMIKACIN:2, in the last 72 hours   Microbiology: Recent Results (from the past 720 hour(s))  CULTURE, BLOOD (ROUTINE X 2)     Status: Normal (Preliminary result)   Collection Time   07/18/12  7:16 PM      Component Value Range Status Comment   Specimen Description LEFT ANTECUBITAL   Final    Special Requests BOTTLES DRAWN AEROBIC AND ANAEROBIC 12CC   Final    Culture PENDING   Incomplete    Report Status PENDING   Incomplete   CULTURE, BLOOD (ROUTINE X 2)     Status: Normal (Preliminary result)   Collection Time   07/18/12  7:22 PM      Component Value Range Status Comment   Specimen Description BLOOD RIGHT HAND   Final    Special Requests BOTTLES DRAWN AEROBIC AND ANAEROBIC [facility]   Final    Culture PENDING   Incomplete    Report Status PENDING   Incomplete     Medical History: Past Medical History  Diagnosis Date  . Shortness of breath   . Anemia   . Chronic kidney disease     renal insufficiency  . CHF (congestive heart failure)   . Dysrhythmia     atrial fibrilation  . Myocardial infarction   . COPD (chronic obstructive pulmonary disease)   . Diabetes mellitus   . Coronary artery disease   . Gout 05/28/2012  . Paroxysmal atrial  fibrillation 11/05/2011    permanent   . Hypothyroidism 05/28/2012  . ICD (implantable cardiac defibrillator) discharge 06/11/2012  . Acute on chronic renal failure 06/11/2012  . ICD (implantable cardiac defibrillator) in place   . Chronic systolic CHF (congestive heart failure) 06/11/2012    Medications:  Zosyn 3.375 Gm for one dose in the ED   Assessment:  This patient is a 77 yo male who is to be given Vancomycin for osteomyelitis of the Rt middle toe, and Zosyn for possible early sepsis.    Goal of Therapy:  Vancomycin troughs 15-20 mg/dl Eradication of infection   Plan:  Preliminary review of pertinent patient information completed.  Protocol will be initiated with a one-time dose of Vancomycin 1 Gm IV and an additional dose of Zosyn 3.375 Gm IV  @ 6 hrs after the initial dose in the Ed.  Jeani Hawking clinical pharmacist will complete review during morning rounds to assess patient and finalize treatment regimen.  Arelia Sneddon, [facility] 07/19/2012,12:03 AM

## 2012-07-19 NOTE — Progress Notes (Signed)
Subjective:  Chart review: This 77 yo male has severe and multiple medical issues. He is a high risk patient for any surgical intervention here at [facility]. He has seen Dr Lajoyce Corners a foot and ankle specialist as recently as 1 week.    Objective: Vital signs in last 24 hours: Temp:  [98.1 F (36.7 C)-101.5 F (38.6 C)] 98.1 F (36.7 C) (08/17 0533) Pulse Rate:  [50-81] 50  (08/17 0825) Resp:  [18-24] 20  (08/17 0533) BP: (103-146)/(58-76) 146/60 mmHg (08/17 0533) SpO2:  [95 %-100 %] 98 % (08/17 0825) Weight:  [89.359 kg (197 lb)-95.255 kg (210 lb)] 91.944 kg (202 lb 11.2 oz) (08/17 0533)  Intake/Output from previous day: 08/16 0701 - 08/17 0700 In: 1320 [P.O.:120; I.V.:400; IV Piggyback:800] Out: 300 [Urine:300] Intake/Output this shift: Total I/O In: 360 [P.O.:360] Out: 300 [Urine:300]   Basename 07/19/12 0613 07/18/12 1826  HGB 10.3* 11.4*    Basename 07/19/12 0613 07/18/12 1826  WBC 14.9* 23.8*  RBC 3.65* 4.03*  HCT 31.6* 34.9*  PLT 184 196    Basename 07/19/12 0613 07/18/12 1826  NA 130* 130*  K 2.9* 3.6  CL 91* 88*  CO2 26 27  BUN 71* 75*  CREATININE 2.31* 2.54*  GLUCOSE 186* 202*  CALCIUM 9.2 9.5    Basename 07/19/12 0613 07/18/12 1826  LABPT -- --  INR 2.45* 2.46*    N/A  Assessment/Plan: Diabetic foot ulcer in extremely complicated patient who is already under care of FOOT /ANKLE specialist with high cardiac risk. If he proves to have osteomyelitis and or doesn't improve or worsens I would highly recommend Transfer ASAP for definitive care at appropriate tertiary care facility    [facility] 07/19/2012, 11:30 AM

## 2012-07-20 ENCOUNTER — Inpatient Hospital Stay (HOSPITAL_COMMUNITY): Payer: Medicare Other

## 2012-07-20 DIAGNOSIS — E871 Hypo-osmolality and hyponatremia: Secondary | ICD-10-CM | POA: Diagnosis present

## 2012-07-20 DIAGNOSIS — R079 Chest pain, unspecified: Secondary | ICD-10-CM | POA: Diagnosis present

## 2012-07-20 DIAGNOSIS — E119 Type 2 diabetes mellitus without complications: Secondary | ICD-10-CM

## 2012-07-20 DIAGNOSIS — L97429 Non-pressure chronic ulcer of left heel and midfoot with unspecified severity: Secondary | ICD-10-CM | POA: Diagnosis present

## 2012-07-20 DIAGNOSIS — A419 Sepsis, unspecified organism: Secondary | ICD-10-CM | POA: Diagnosis present

## 2012-07-20 LAB — GLUCOSE, CAPILLARY
Glucose-Capillary: 239 mg/dL — ABNORMAL HIGH (ref 70–99)
Glucose-Capillary: 236 mg/dL — ABNORMAL HIGH (ref 70–99)

## 2012-07-20 LAB — BASIC METABOLIC PANEL
BUN: 61 mg/dL — ABNORMAL HIGH (ref 6–23)
CO2: 26 mEq/L (ref 19–32)
Calcium: 8.9 mg/dL (ref 8.4–10.5)
Chloride: 97 mEq/L (ref 96–112)
Creatinine, Ser: 1.9 mg/dL — ABNORMAL HIGH (ref 0.50–1.35)
GFR calc Af Amer: 38 mL/min — ABNORMAL LOW (ref >90)
GFR calc non Af Amer: 33 mL/min — ABNORMAL LOW (ref >90)
Glucose, Bld: 247 mg/dL — ABNORMAL HIGH (ref 70–99)
Potassium: 3.5 mEq/L (ref 3.5–5.1)
Sodium: 133 mEq/L — ABNORMAL LOW (ref 135–145)

## 2012-07-20 LAB — MAGNESIUM: Magnesium: 2.3 mg/dL (ref 1.5–2.5)

## 2012-07-20 MED ORDER — POTASSIUM CHLORIDE CRYS ER 20 MEQ PO TBCR
40.0000 meq | EXTENDED_RELEASE_TABLET | Freq: Two times a day (BID) | ORAL | Status: DC
  Administered 2012-07-20: 40 meq via ORAL
  Filled 2012-07-20: qty 2

## 2012-07-20 MED ORDER — SODIUM CHLORIDE 0.9 % IJ SOLN
INTRAMUSCULAR | Status: AC
  Administered 2012-07-20: 13:00:00
  Filled 2012-07-20: qty 3

## 2012-07-20 MED ORDER — NITROGLYCERIN 0.4 MG SL SUBL
0.4000 mg | SUBLINGUAL_TABLET | SUBLINGUAL | Status: DC | PRN
  Administered 2012-07-20 (×2): 0.4 mg via SUBLINGUAL
  Filled 2012-07-20: qty 25

## 2012-07-20 NOTE — Progress Notes (Addendum)
Notified that O2 dropped into the 80s. Applied 2L of O2., no improvement, so 3L applied, O2 at 87%. increased to 4L and came up to 97%.  Sheryn Bison

## 2012-07-20 NOTE — Progress Notes (Signed)
Notified Dr. Irene Limbo that pt c/o chest pain again. He says that it is sharp when he coughs, moves, etc, and then gets dull for a bit. Morphine given, and did decrease pain. No significant changes to telemetry. Sheryn Bison

## 2012-07-20 NOTE — Progress Notes (Signed)
[facility] PROGRESS NOTE  [name]:[id number] DOB: Apr 10, 1935 DOA: 07/18/2012 PCP: Colette Ribas, MD Cardiologist--Mihai Croitoru, MD Orthopedic Filiberto Pinks, MD  Assessment/Plan: 1. Sepsis, present on admission--secondary to right toe cellulitis. Clinically resolving; hemodynamically stable (hypotension resolved), leukocytosis improving. Continue empiric Zosyn and Vancomycin.  2. Right middle toe cellulitis, diabetic--x-ray negative; CT pending to assess for osteomyelitis (cannot pursue MRI with AICD). Continue antibiotics. If CT confirms osteomyelitis, then orthopedics recommends transfer to [facility] for definitive care secondary to multiple co-morbidities. 3. Large left heel ulcer, diabetic--wound bed appears without gross infection; minimal exudate, no pus; no surrounding erythema, non-tender. Wound care. 4. AICD--by report, this fired at home prior to arrival (patient instructed by cardiology to come to ED). No activity as inpatient. 5. Chest pain--present since fall (prior to admission). Troponin negative x1. Atypical, constant for several days. EKG--afib, multiple PVCs, LBBB (old). No signs, symptoms or history to suggest ACS. 6. Acute renal failure/dehydration superimposed on CKD III-IV--Modest improvement. Hold off on further fluids with resolution of hypotension and low EF. Most likely secondary to decreased intravascular volume (hyponatremia and hypochloremia) complicated by sepsis 7. Hyponatremia--stable. Likely multifactorial--dehydration, diuretics.  8. Hypokalemia--replete. 9. DM type 2, controlled--continue SSI, Lantus. 10. Anemia of chronic disease---at baseline. 11. Laceration left eyebrow repaired 8/13--sutures out 8/19. 12. History of CAD, CABG, LBBB--appears stable. Continue Coreg, Zocor. 13. History of ischemic cardiomyopathy, AICD placement--appears stable. 14. Combined systolic & diastolic dysfunction--compensated. Continue Coreg. Lasix on  hold secondary to acute renal failure. Doubt weights are accurate. Follow daily weights, I&O; will probably need to resume Lasix 8/19. 15. History of atrial fibrillation--continue warfarin.  Code Status: DNR Family Communication: none present Disposition Plan: pending further evaluation and response to treatment  Brendia Sacks, MD  [facility] Team 1 Pager [phone]. If 7PM-7AM, please contact night-coverage at [email or web address], password [facility] 07/20/2012, 9:20 AM  LOS: 2 days   Brief narrative: 38 yom sent to ED by cards office for firing of AICD. Pt has been sick for several weeks with decrease po intake. Saw Dr. Lajoyce Corners (ortho) 2 days ago prior to admission and was diagnosed with probable osteomyelitis and placed on doxycycline po. Over that week period the wound progressed, the toe became more swollen and started draining pus and more red. Pt found to be mildly hypotensive in the ED and febrile with significant leukocytosis and worsening renal failure.   Consultants:  Orthopedics  Procedures:    Antibiotics:  Zosyn 8/16>>  Vancomycin 8/17>>  HPI/Subjective: Febrile 101.6 last night. Complains of persistant chest pain, no left arm, neck or shoulder pain. No nausea, vomiting. Chronic shortness of breath.  Objective: Filed Vitals:   07/19/12 2257 07/20/12 0543 07/20/12 0801 07/20/12 0914  BP:  161/65  161/72  Pulse:  43 71 66  Temp: 99.3 F (37.4 C) 98.2 F (36.8 C)  98.5 F (36.9 C)  TempSrc:      Resp:  20  18  Height:      Weight:  92.443 kg (203 lb 12.8 oz)    SpO2:  95% 97% 99%    Intake/Output Summary (Last 24 hours) at 07/20/12 0920 Last data filed at 07/20/12 0915  Gross per 24 hour  Intake    790 ml  Output   1700 ml  Net   -910 ml   Exam:   General:  Appears calm and comfortable.   ENT: hard of hearing  Cardiovascular: irregular, normal rate; no m/r/g. 2+ bilateral lower extremity edema.  Respiratory: CTA bilaterally, no w/r/r.  Normal  respiratory effort.  Abdomen: soft, non-tender, non-distended  Skin: right foot: 3rd toe erythematous, non-tender; ulcer at tip without pus. LLE: foot--large round heel ulcer without frank pus; no surrounding erythema. Abrasions lower leg noted, without evidence of infection.  Psychiatric: speech fluent and clear; mood and affect grossly normal  Data Reviewed: Basic Metabolic Panel:  Lab 07/19/12 1610 07/18/12 1916 07/18/12 1826  NA 130* -- 130*  K 2.9* -- 3.6  CL 91* -- 88*  CO2 26 -- 27  GLUCOSE 186* -- 202*  BUN 71* -- 75*  CREATININE 2.31* -- 2.54*  CALCIUM 9.2 -- 9.5  MG -- 2.1 --  PHOS -- -- --   CBC:  Lab 07/19/12 0613 07/18/12 1826  WBC 14.9* 23.8*  NEUTROABS -- --  HGB 10.3* 11.4*  HCT 31.6* 34.9*  MCV 86.6 86.6  PLT 184 196   Cardiac Enzymes:  Lab 07/18/12 1826  CKTOTAL --  CKMB --  CKMBINDEX --  TROPONINI <0.30   CBG:  Lab 07/20/12 0740 07/19/12 2112 07/19/12 1627 07/19/12 1120 07/19/12 0720  GLUCAP 133* 262* 221* 254* 176*    Recent Results (from the past 240 hour(s))  CULTURE, BLOOD (ROUTINE X 2)     Status: Normal (Preliminary result)   Collection Time   07/18/12  7:16 PM      Component Value Range Status Comment   Specimen Description LEFT ANTECUBITAL   Final    Special Requests BOTTLES DRAWN AEROBIC AND ANAEROBIC 12CC   Final    Culture NO GROWTH 2 DAYS   Final    Report Status PENDING   Incomplete   CULTURE, BLOOD (ROUTINE X 2)     Status: Normal (Preliminary result)   Collection Time   07/18/12  7:22 PM      Component Value Range Status Comment   Specimen Description BLOOD RIGHT HAND   Final    Special Requests BOTTLES DRAWN AEROBIC AND ANAEROBIC 7CC   Final    Culture NO GROWTH 2 DAYS   Final    Report Status PENDING   Incomplete      Studies: Chest Portable 1 View  07/18/2012  *RADIOLOGY REPORT*  IMPRESSION:  1.  Cardiomegaly and vascular congestion. 2.  No focal consolidations.  Original Report Authenticated By: Patterson Hammersmith, M.D.   Dg Foot Complete Right  07/19/2012  *RADIOLOGY REPORT*  IMPRESSION:  Large erosions about the first MTP joint and IP joint of the second toe likely due to gout.  No plain film evidence of osteomyelitis is identified.  Original Report Authenticated By: Bernadene Bell. D'ALESSIO, M.D.   Scheduled Meds:   . allopurinol  100 mg Oral Daily  . antiseptic oral rinse  15 mL Mouth Rinse BID  . carvedilol  12.5 mg Oral BID WC  . insulin aspart  0-15 Units Subcutaneous TID WC  . insulin aspart  0-5 Units Subcutaneous QHS  . insulin glargine  20 Units Subcutaneous BID  . levothyroxine  50 mcg Oral QAC breakfast  . piperacillin-tazobactam (ZOSYN)  IV  3.375 g Intravenous Q8H  . simvastatin  40 mg Oral QHS  . sodium chloride  3 mL Intravenous Q12H  . vancomycin  1,500 mg Intravenous Q24H  . warfarin  2.5 mg Oral Custom  . warfarin  5 mg Oral Custom  . Warfarin - Physician Dosing Inpatient   Does not apply q1800  . DISCONTD: carvedilol  25 mg Oral BID WC  . DISCONTD: insulin glargine  10 Units Subcutaneous  BID  . DISCONTD: insulin glargine  20 Units Subcutaneous BID   Continuous Infusions:   . sodium chloride 100 mL/hr at 07/19/12 0133    Principal Problem:  *Sepsis Active Problems:  Diabetic foot infection, just discharged 05/30/12  ICD (implantable cardiac defibrillator) in place  Chronic a-fib  Diabetes mellitus type 2, insulin dependent  Ischemic cardiomyopathy, EF 25% 2D 12/12  Acute on chronic renal failure, SCr 2.3-3.3 secondary to dehydration  Hyponatremia  Hx of CABG x 6 2003  CKD (chronic kidney disease) stage 4, GFR 15-29 ml/min  Anemia due to chronic illness  PVD, LLE PVA 06/13/12- for LLE PTA 7/16  Ulcer of left heel  Chest pain     Brendia Sacks, MD  [facility] Pager [phone]. If 7PM-7AM, please contact night-coverage at [email or web address], password [facility] 07/20/2012, 9:20 AM  LOS: 2 days   Time spent: 25 minutes

## 2012-07-20 NOTE — Progress Notes (Signed)
Notified Dr. Irene Limbo that pt is c/o chest pain, having abnormal telemetry strip. mulitple PVCs, multifocal and some couplets. Strip will appear afib at times, then paced at times, 1 degree AVB other times. Dr. Irene Limbo is placing orders. Sheryn Bison

## 2012-07-20 NOTE — Progress Notes (Addendum)
Notified Dr. Irene Limbo to review pts EKG results, critical test result. Long QTc, AV Block. Pt continues to c/o pain at 4/10. One SL nitro given at 0938, second given 0948. Dr. Irene Limbo reviewed, doesn't feel that chest pain is cardiac, will not give any more nitro at this time. Sheryn Bison

## 2012-07-21 LAB — CBC
HCT: 31.6 % — ABNORMAL LOW (ref 39.0–52.0)
MCH: 27.7 pg (ref 26.0–34.0)
MCV: 86.6 fL (ref 78.0–100.0)
Platelets: 180 10*3/uL (ref 150–400)
RDW: 17 % — ABNORMAL HIGH (ref 11.5–15.5)
WBC: 9.7 10*3/uL (ref 4.0–10.5)

## 2012-07-21 LAB — BASIC METABOLIC PANEL
BUN: 56 mg/dL — ABNORMAL HIGH (ref 6–23)
Creatinine, Ser: 1.78 mg/dL — ABNORMAL HIGH (ref 0.50–1.35)
GFR calc Af Amer: 42 mL/min — ABNORMAL LOW (ref >90)
GFR calc non Af Amer: 36 mL/min — ABNORMAL LOW (ref >90)
Glucose, Bld: 238 mg/dL — ABNORMAL HIGH (ref 70–99)
Potassium: 3.6 mEq/L (ref 3.5–5.1)

## 2012-07-21 LAB — GLUCOSE, CAPILLARY
Glucose-Capillary: 194 mg/dL — ABNORMAL HIGH (ref 70–99)
Glucose-Capillary: 193 mg/dL — ABNORMAL HIGH (ref 70–99)

## 2012-07-21 MED ORDER — INSULIN GLARGINE 100 UNIT/ML ~~LOC~~ SOLN
22.0000 [IU] | Freq: Two times a day (BID) | SUBCUTANEOUS | Status: DC
  Administered 2012-07-21 – 2012-07-23 (×4): 22 [IU] via SUBCUTANEOUS

## 2012-07-21 MED ORDER — FUROSEMIDE 80 MG PO TABS
80.0000 mg | ORAL_TABLET | Freq: Two times a day (BID) | ORAL | Status: DC
  Administered 2012-07-21 – 2012-07-23 (×5): 80 mg via ORAL
  Filled 2012-07-21 (×5): qty 1

## 2012-07-21 NOTE — Care Management Note (Signed)
    Page 1 of 2   07/23/2012     1:19:01 PM   CARE MANAGEMENT NOTE 07/23/2012  Patient:  [name], [name]   Account Number:  [id number]  Date Initiated:  07/21/2012  Documentation initiated by:  Rosemary Holms  Subjective/Objective Assessment:   Pt admitted with Diabetic foot infection/sepsis. Currently getting [facility] RN services three times a week through [facility]. His grandaugher does his dressing changes on te days the [facility] RN does not come.     Action/Plan:   Pt plans to DC on same wound care arrangement including HH and family assistance.   Anticipated DC Date:  07/23/2012   Anticipated DC Plan:  HOME W HOME HEALTH SERVICES      DC Planning Services  CM consult      Choice offered to / List presented to:          [facility] arranged  HH-1 RN      [facility] agency  [facility]   Status of service:  Completed, signed off Medicare Important Message given?   (If response is "NO", the following Medicare IM given date fields will be blank) Date Medicare IM given:   Date Additional Medicare IM given:    Discharge Disposition:  HOME W HOME HEALTH SERVICES  Per UR Regulation:    If discussed at Long Length of Stay Meetings, dates discussed:    Comments:  07/23/12 1315 Myana Schlup RN BSN CM Faxed DC summary to [place]. Pt declined HH PT. Resume RN. Also faxed Wound Care instructions.  07/22/12 1030 Antoine Vandermeulen RN BSN CM Spoke to [place] at [facility], advised that pt is anticipated to be DC'd tomorrow with [facility] RN and PT. DC summary needs to be faxed to her at [phone].  07/21/12 1030 Taralynn Quiett RN BSN CM

## 2012-07-21 NOTE — Evaluation (Signed)
Physical Therapy Evaluation Patient Details Name: [name] MRN: [id number] DOB: March 25, 1935 Today's Date: 07/21/2012 Time: 4098-1191 PT Time Calculation (min): 39 min  PT Assessment / Plan / Recommendation Clinical Impression  Pt seen for eval.  He appeared to be very depressed.  He was cooperative with ther ex in the bed to allow for MMT, but refused to get OOB or even sit at EOB.  He is determined to go home at d/c and "manage any way I can".   I spoke with him about deconditioning and the importance of trying to increase mobility if he wants to go home, but to no avail.  He would probably be appropriate for SNF, but he is dead set against it after a bad experience at a SNF in [place].   For going home, he would need ambulance transport and HHPT.              PT Assessment  Patient needs continued PT services    Follow Up Recommendations  Home health PT;Skilled nursing facility    Barriers to Discharge Decreased caregiver support;Inaccessible home environment      Equipment Recommendations  Defer to next venue    Recommendations for Other Services     Frequency Min 3X/week    Precautions / Restrictions Precautions Precautions: Fall Restrictions Weight Bearing Restrictions: No         Mobility  Bed Mobility Details for Bed Mobility Assistance: pt refuses to try to get OOB or even to sit at EOB..."I have too much on my mind"    Exercises General Exercises - Lower Extremity Ankle Circles/Pumps: AROM;Both;15 reps;Supine Quad Sets: AROM;Both;10 reps;Supine Gluteal Sets: AROM;Both;10 reps;Supine Short Arc Quad: AROM;Both;15 reps;Supine Heel Slides: Strengthening;Both;10 reps;Supine;AROM Hip ABduction/ADduction: Supine;AROM;Strengthening;Both;10 reps   PT Diagnosis: Difficulty walking;Generalized weakness  PT Problem List: Decreased strength;Decreased activity tolerance;Decreased mobility;Decreased skin integrity;Cardiopulmonary status limiting activity PT Treatment  Interventions: DME instruction;Gait training;Functional mobility training;Therapeutic activities;Therapeutic exercise;Patient/family education   PT Goals Acute Rehab PT Goals PT Goal Formulation: With patient Time For Goal Achievement: 08/04/12 Potential to Achieve Goals: Good Pt will go Supine/Side to Sit: with modified independence;with HOB 0 degrees PT Goal: Supine/Side to Sit - Progress: Goal set today Pt will go Sit to Supine/Side: with modified independence;with HOB 0 degrees PT Goal: Sit to Supine/Side - Progress: Goal set today Pt will go Sit to Stand: with modified independence;with upper extremity assist PT Goal: Sit to Stand - Progress: Goal set today Pt will go Stand to Sit: with modified independence;with upper extremity assist PT Goal: Stand to Sit - Progress: Goal set today Pt will Ambulate: 1 - 15 feet;with supervision;with rolling walker PT Goal: Ambulate - Progress: Goal set today Pt will Go Up / Down Stairs: 3-5 stairs;with supervision;with rail(s) PT Goal: Up/Down Stairs - Progress: Goal set today  Visit Information  Last PT Received On: 07/21/12    Subjective Data  Subjective: noone can do anything to help me Patient Stated Goal: wants to return home..".even if it takes me 30 min to get up the steps into the house"   Prior Functioning  Home Living Lives With: Alone Available Help at Discharge: Family Type of Home: House Home Access: Stairs to enter [employer] of Steps: full flight...16 Entrance Stairs-Rails: Right;Left;Can reach both Home Layout: One level Bathroom Toilet: Standard Home Adaptive Equipment: Walker - rolling;Quad cane Prior Function Level of Independence: Independent with assistive device(s) (just recently has needed some assist) Able to Take Stairs?: Yes Driving: No Vocation: Retired Special educational needs teacher  Communication: No difficulties    Cognition  Overall Cognitive Status: Appears within functional limits for tasks  assessed/performed Arousal/Alertness: Awake/alert Orientation Level: Appears intact for tasks assessed Behavior During Session: [facility] for tasks performed Cognition - Other Comments: appears to be very depressed over his health status    Extremity/Trunk Assessment Right Lower Extremity Assessment RLE ROM/Strength/Tone: WFL for tasks assessed RLE Sensation: History of peripheral neuropathy Left Lower Extremity Assessment LLE ROM/Strength/Tone: WFL for tasks assessed LLE Sensation: History of peripheral neuropathy   Balance Balance Balance Assessed: No  End of Session PT - End of Session Equipment Utilized During Treatment:  (bilateral boots on LEs for skin protection) Activity Tolerance: Patient tolerated treatment well Patient left: in bed;with bed alarm set;with call bell/phone within reach  GP     Myrlene Broker L 07/21/2012, 1:36 PM

## 2012-07-21 NOTE — Progress Notes (Signed)
[facility] PROGRESS NOTE   ZOX:[id number] DOB: 1935/05/19 DOA: 07/18/2012 PCP: Colette Ribas, MD Cardiologist--Mihai Croitoru, MD Orthopedic Filiberto Pinks, MD  Assessment/Plan: 1. Sepsis, present on admission--secondary to right toe cellulitis. Clinically resolving; hemodynamically stable (hypotension resolved),  Continue empiric Zosyn and Vancomycin.  2. Right middle toe cellulitis, diabetic--x-ray negative; CT negative for osteomyelitis could not get MRI with AICD). Continue antibiotics. 3. Large left heel ulcer, diabetic--wound bed appears without gross infection; minimal exudate, no pus; no surrounding erythema, non-tender. Wound care per WOC. 4. AICD--by report, this fired at home prior to arrival (patient instructed by cardiology to come to ED). No activity as inpatient.  Patient concerned about not seeing cardiology and continues to have pain at the pacemaker site. Consulted cardiology. 5. Chest pain--present since fall (prior to admission). Troponin negative . Atypical, constant for several days. EKG--afib, multiple PVCs, LBBB (old). No signs, symptoms or history to suggest ACS. 6. Acute renal failure/dehydration superimposed on CKD III-IV--improving 7. Hyponatremia--Resolved. Likely multifactorial--dehydration, diuretics.  8. Hypokalemia--Resolved. 9. DM type 2, controlled--continue SSI, Lantus. 10. Anemia of chronic disease---at baseline. 11. Laceration left eyebrow repaired 8/13--sutures out today. 12. History of CAD, CABG, LBBB--appears stable. Continue Coreg, Zocor. 13. History of ischemic cardiomyopathy, AICD placement--appears stable. 14. Combined systolic & diastolic dysfunction--compensated. Continue Coreg. Restart Lasix  Follow daily weights, I&O;  15. History of atrial fibrillation--continue warfarin.  Code Status: DNR Family Communication: none present Disposition Plan: pending further evaluation and response to treatment  Molli Posey, MD  [facility] Team 2 Pager [phone].  If 7PM-7AM, please contact night-coverage at [email or web address], password [facility] 07/21/2012, 10:46 AM  LOS: 3 days   Brief narrative: 76 yom sent to ED by cards office for firing of AICD. Pt has been sick for several weeks with decrease po intake. Saw Dr. Lajoyce Corners (ortho) 2 days ago prior to admission and was diagnosed with probable osteomyelitis and placed on doxycycline po. Over that week period the wound progressed, the toe became more swollen and started draining pus and more red. Pt found to be mildly hypotensive in the ED and febrile with significant leukocytosis and worsening renal failure.   Consultants:  Orthopedics  Procedures:    Antibiotics:  Zosyn 8/16>>  Vancomycin 8/17>>  HPI/Subjective:  still continues to complain of pain over the pacemaker site.    This has been ongoing since his fall. Chronic shortness of breath.  Patient concerned about not seeing cardiology regarding his pacemaker   Objective: Filed Vitals:   07/20/12 1951 07/20/12 2120 07/21/12 0606 07/21/12 0728  BP:  137/93 149/73   Pulse:  68 65   Temp:  98.7 F (37.1 C) 98.4 F (36.9 C)   TempSrc:  Oral Oral   Resp:  18 18   Height:      Weight:   92.851 kg (204 lb 11.2 oz)   SpO2: 99% 98% 94% 95%    Intake/Output Summary (Last 24 hours) at 07/21/12 1046 Last data filed at 07/21/12 0815  Gross per 24 hour  Intake   1890 ml  Output   1375 ml  Net    515 ml   Exam:   General:  Appears calm and comfortable.   ENT: hard of hearing  Cardiovascular: irregular, normal rate; no m/r/g. 2+ bilateral lower extremity edema.  Respiratory: CTA bilaterally, no w/r/r. Normal respiratory effort.  Abdomen: soft, non-tender, non-distended  Skin: right foot: 3rd toe mild erythema, non-tender; ulcer at tip without pus. LLE: foot--large round heel ulcer without frank pus; no  surrounding erythema,  good granulation tissue. Abrasions lower leg noted, without evidence of  infection.  Psychiatric: speech fluent and clear; mood and affect grossly normal  Data Reviewed: Basic Metabolic Panel:  Lab 07/21/12 1610 07/20/12 2021 07/19/12 0613 07/18/12 1916 07/18/12 1826  NA 136 133* 130* -- 130*  K 3.6 3.5 2.9* -- 3.6  CL 100 97 91* -- 88*  CO2 25 26 26  -- 27  GLUCOSE 238* 247* 186* -- 202*  BUN 56* 61* 71* -- 75*  CREATININE 1.78* 1.90* 2.31* -- 2.54*  CALCIUM 9.1 8.9 9.2 -- 9.5  MG -- 2.3 -- 2.1 --  PHOS -- -- -- -- --   CBC:  Lab 07/21/12 0528 07/19/12 0613 07/18/12 1826  WBC 9.7 14.9* 23.8*  NEUTROABS -- -- --  HGB 10.1* 10.3* 11.4*  HCT 31.6* 31.6* 34.9*  MCV 86.6 86.6 86.6  PLT 180 184 196   Cardiac Enzymes:  Lab 07/18/12 1826  CKTOTAL --  CKMB --  CKMBINDEX --  TROPONINI <0.30   CBG:  Lab 07/21/12 0733 07/20/12 2118 07/20/12 1638 07/20/12 1145 07/20/12 0740  GLUCAP 194* 236* 286* 239* 133*    Recent Results (from the past 240 hour(s))  CULTURE, BLOOD (ROUTINE X 2)     Status: Normal (Preliminary result)   Collection Time   07/18/12  7:16 PM      Component Value Range Status Comment   Specimen Description LEFT ANTECUBITAL   Final    Special Requests BOTTLES DRAWN AEROBIC AND ANAEROBIC 12CC   Final    Culture NO GROWTH 2 DAYS   Final    Report Status PENDING   Incomplete   CULTURE, BLOOD (ROUTINE X 2)     Status: Normal (Preliminary result)   Collection Time   07/18/12  7:22 PM      Component Value Range Status Comment   Specimen Description BLOOD RIGHT HAND   Final    Special Requests BOTTLES DRAWN AEROBIC AND ANAEROBIC 7CC   Final    Culture NO GROWTH 2 DAYS   Final    Report Status PENDING   Incomplete      Studies: Chest Portable 1 View  07/18/2012  *RADIOLOGY REPORT*  IMPRESSION:  1.  Cardiomegaly and vascular congestion. 2.  No focal consolidations.  Original Report Authenticated By: Patterson Hammersmith, M.D.   Dg Foot Complete Right  07/19/2012  *RADIOLOGY REPORT*  IMPRESSION:  Large erosions about the first MTP  joint and IP joint of the second toe likely due to gout.  No plain film evidence of osteomyelitis is identified.  Original Report Authenticated By: Bernadene Bell. D'ALESSIO, M.D.   Scheduled Meds:    . allopurinol  100 mg Oral Daily  . antiseptic oral rinse  15 mL Mouth Rinse BID  . carvedilol  12.5 mg Oral BID WC  . furosemide  80 mg Oral BID  . insulin aspart  0-15 Units Subcutaneous TID WC  . insulin aspart  0-5 Units Subcutaneous QHS  . insulin glargine  20 Units Subcutaneous BID  . levothyroxine  50 mcg Oral QAC breakfast  . piperacillin-tazobactam (ZOSYN)  IV  3.375 g Intravenous Q8H  . simvastatin  40 mg Oral QHS  . sodium chloride  3 mL Intravenous Q12H  . sodium chloride      . vancomycin  1,500 mg Intravenous Q24H  . warfarin  2.5 mg Oral Custom  . warfarin  5 mg Oral Custom  . Warfarin - Physician Dosing Inpatient  Does not apply q1800  . DISCONTD: potassium chloride  40 mEq Oral BID   Continuous Infusions:   Principal Problem:  Sepsis Active Problems:  Hx of CABG x 6 2003  ICD (implantable cardiac defibrillator) in place  Chronic a-fib  Diabetes mellitus type 2, insulin dependent  CKD (chronic kidney disease) stage 4, GFR 15-29 ml/min  Anemia due to chronic illness  Ischemic cardiomyopathy, EF 25% 2D 12/12  PVD, LLE PVA 06/13/12- for LLE PTA 7/16  Diabetic foot infection, just discharged 05/30/12  Acute on chronic renal failure, SCr 2.3-3.3 secondary to dehydration  Ulcer of left heel  Chest pain  Hyponatremia     Molli Posey, MD  [facility] Pager [phone]. If 7PM-7AM, please contact night-coverage at [email or web address], password [facility] 07/21/2012, 10:46 AM  LOS: 3 days   Time spent: 25 minutes

## 2012-07-21 NOTE — Consult Note (Signed)
WOC consult Note Reason for Consult:multiple wounds on lower extremities. Patient with neuropathy; no sensation on plantar foot from midfoot to toes upon assessment. (0/6) Patient injuries (left eyebrow laceration, some bruises are acute and were sustained during recent fall; the heel and some of the LE areas are chronic; he has been treated at the outpatient wound care center in [place]. Wound type:neuropathic (left heel), arterial insufficiency (right third toe, left anterior foot, left achilles region, bilateral LE)  Pressure Ulcer POA: No Measurement:left heel is 3cm round x .4cm deep and presents as pale pink, clean base.  Left anterior foot is ecchymotic, affected area encompasses a 10cm x 6cm area with partial thickness open area at this time.  Epidermis is peeling.  Left LE ulcer  in pretibial area presents with eschar measuring 1.5cm x .5cm. Left achilles region ulceration measures 3cm x 2.5cm with both ecchymosis and partial thickness tissue loss. Right LE ulcer in pretibial area presents with eschar measuring 1cm round. Wound bed:As above. Drainage (amount, consistency, odor) scant amount pale yellow exudate. Periwound:intact, but with ecchymotic areas. Dressing procedure/placement/frequency: I will discontinue the use of the soft silicone foam dressings to the left heel, left Achilles region and left anterior foot and recommend twice daily saline dressings, secured with Kerlix wrap and tape.  This will allow for visualization and moisture retentive support of the wound environment simultaneously. A saline dressing is recommended to the right third toe and it is noted that should this test positively for osteo, that an orthopedic MD will continue to follow with perhaps a transfer to [facility] for continued treatment. Given his neuropathy, safety in ambulation is a concern; if you agree, consider PT eval. I will not follow.  Please re-consult if needed. Thanks, Ladona Mow, MSN, RN, [facility], CWOCN  [phone])

## 2012-07-21 NOTE — Consult Note (Signed)
Reason for Consult: Severe peripheral vascular disease. Ischemic and diabetic ulcerations with probable gangrene right third toe distal left heel and bilateral pretibial lesions  Referring Physician: Hospitalists cardiac patient of Dr. Gery Pray and Dr. Rubie Maid.  [name] is an 77 y.o. male.    Chief Complaint: Nausea vomiting weakness and foot pain of several days' duration possible ICD discharge  HPI: [name] is a 77 year old divorced white father of one with 3 grandchildren quit smoking over 8 years ago. He has multiple severe cardiovascular problems he has had multiple hospitalizations for ICD discharges at [facility] along with his peripheral arterial and coronary disease.  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 He has had a prior history of autoimmune hemolytic anemia treated with prednisone in the past which has not recurred. He is under the care of Dr. Vanessa [place] for his renal failure appear he sees Dr. Aldine Contes oh for followup of his ICD. He was seen as an outpatient 06/02/2012 referred to Dr. Gery Pray for nonhealing left lower extremity heel ulcer that was painful. Despite his renal insufficiency and the family accepted the risks of contrast and you underwent peripheral angiography by Dr. Gery Pray on 06/17/2012  He was found to have total occlusion of below knee popliteal on the left and had complex peripheral vascular intervention in the setting of critical limb ischemia to try to restore straight-line flow to his left foot. He had successful orbital rotational after a atherectomy and cutting balloon atherectomy of the totally occluded below knee popliteal and anterior tibial artery. He tolerated this well he only had a mild bump in his creatinine and followup ABIs of 07/07/2012 showed flow in the left dorsalis pedis and peroneal arteries. Severe calcification so  ABIs were not able to be measured.  He was last seen by Dr. Gery Pray on 07/17/2012. It was noted that he has been followed by Dr. Berna Spare due to orthopedic surgery for wound care and that he might require amputation of his right third toe which appeared gangrenous.  He was admitted with several days of weakness fatigue nausea vomiting possible fever without chills and pain in his left foot  He has also had a recent ER visit for fall with left head S. laceration in July. He has not had any recent chest pain has been no PND or orthopnea and he also has obstructive sleep apnea  Is not sure if he had an ICD discharge on Friday and he has had prior ICD discharges on 07/07/2012 and 06/15/2012. His ICD discharges 06/15/2012 with a known when his ICD was interrogated on admission to [facility] for his peripheral angiography. He has not had any recent chest pain or palpitations.  He was admitted with hypokalemia worsening renal failure with BUN/creatinine 71/2.31 which is improved to 56/1.78. Associated chronic anemia and hypokalemia on admission which has responded to therapy and replacement per he has diuresis over the last 2 days and remained afebrile. His blood cultures still pending although it is presumed that he may have had sepsis and he was started on intravenous antibiotics for possible sepsis and associated cellulitis. He has been seen by orthopedic surgery since he's been in the hospital Dr. Romeo Apple. He was continued on a diabetic and wound care of his left heel pretibial areas and right third toe. His blood pressures been stable and he is maintained sinus rhythm with a prior history of  PAF. He lives alone in a small house his daughter lives next door and he uses a walker at home. On prior hospitalizations he declined any consideration for possible future dialysis and he is also been made a DO NOT RESUSCITATE. He does want full supportive medical care at present.  Full ICD interrogation was done  today. The patient did not have any discharges prompting this hospitalization of his ICD. His last ICD discharge was 8/54 sustained ventricular tachycardia with successful cardioversion with 35 J shock. Prior to that his last discharge of his ICD was 06/09/2012 which was known when he was admitted to [facility]. Since that time he has not had any significant arrhythmias and he has maintained sinus rhythm. His alcohol has been elevated compatible with fluid overload but he has diuresis in the last several days in the hospital. Dr  Next number single chamber ICD implanted May of 2011 for purposes of primary prevention with EF less than 25%  Next number chronic-intermittent paroxysmal atrial fibrillation on chronic Coumadin next number y and associated pain  Next number past history of prostate cancer  Next number anemia past autoimmune hemolytic anemia with steroid therapy fall of 2012  Next number chronic renal insufficiency improved in the hospital  Details of his past history will be outlined below below.  #1 ischemic cardiomyopathy last EF 20-35% on echo 12/12 when admitted with heart failure responding to therapy #2 coronary artery disease with multivessel CABG x6 by Dr. Bluford Kaufmann in 2003 3 insulin-dependent diabetes 4 peripheral arterial disease with prior left SFA stent and right common oh common and external iliac stent 2003 progression of disease next  #5 left heel ulcer without definite osteomyelitis under the care of Dr. Mariana Arn recent right third toe necrosis with Next number paroxysmal atrial fibrillation on chronic Coumadin #11 hypothyroid on supplementation #12 history of arthritis followed in the past rheumatology clinic at the [facility] Hyperlipidemia Obstructive sleep apnea COPD #6 past history of prostate cancer #7 past history of oral hemolytic anemia responding to steroid therapy fall of 2012 Next number renal insufficiency associated azotemia and hypokalemia treated Next number ICD  implanted May of 2011 for primary prevention recent discharges as outlined above 07/07/2012 and 06/09/2012 Paroxysmal atrial fibrillation At the thyroid on supplement Hyperlipidemia Arthritis followed at [facility] clinic in rheumatology  Past Medical History  Diagnosis Date  . Shortness of breath   . Anemia   . Chronic kidney disease     renal insufficiency  . CHF (congestive heart failure)   . Dysrhythmia     atrial fibrilation  . Myocardial infarction   . COPD (chronic obstructive pulmonary disease)   . Diabetes mellitus   . Coronary artery disease   . Gout 05/28/2012  . Paroxysmal atrial fibrillation 11/05/2011    permanent   . Hypothyroidism 05/28/2012  . ICD (implantable cardiac defibrillator) discharge 06/11/2012  . Acute on chronic renal failure 06/11/2012  . ICD (implantable cardiac defibrillator) in place   . Chronic systolic CHF (congestive heart failure) 06/11/2012    Past Surgical History  Procedure Date  . Coronary artery bypass graft   . Cardiac catheterization     History reviewed. No pertinent family history. Social History:  reports that he quit smoking about 8 years ago. He has quit using smokeless tobacco. He reports that he does not drink alcohol or use illicit drugs.  Allergies: No Known Allergies  Medications Prior to Admission  Medication Sig Dispense Refill  . allopurinol (ZYLOPRIM) 100 MG tablet Take  1 tablet (100 mg total) by mouth daily.  30 tablet  5  . carvedilol (COREG) 25 MG tablet Take 1 tablet (25 mg total) by mouth 2 (two) times daily with a meal.  60 tablet  5  . doxycycline (VIBRA-TABS) 100 MG tablet Take 100 mg by mouth 2 (two) times daily.      . furosemide (LASIX) 80 MG tablet Take 80 mg by mouth 2 (two) times daily.      . hydrALAZINE (APRESOLINE) 25 MG tablet Take 25 mg by mouth 3 (three) times daily.      . insulin aspart (NOVOLOG) 100 UNIT/ML injection Inject 10-15 Units into the skin 3 (three) times daily before meals.       . insulin  glargine (LANTUS) 100 UNIT/ML injection Inject 10 Units into the skin 2 (two) times daily.      . isosorbide dinitrate (ISORDIL) 20 MG tablet Take 20 mg by mouth 3 (three) times daily.      Marland Kitchen levothyroxine (SYNTHROID, LEVOTHROID) 50 MCG tablet Take 50 mcg by mouth every morning.       . metolazone (ZAROXOLYN) 2.5 MG tablet Take 2.5 mg by mouth daily. States he takes with his furosemide every day except on Tuesday and Friday      . omeprazole (PRILOSEC) 20 MG capsule Take 40 mg by mouth every morning.       . simvastatin (ZOCOR) 40 MG tablet Take 40 mg by mouth at bedtime.       Marland Kitchen warfarin (COUMADIN) 5 MG tablet Take 2.5-5 mg by mouth every evening. Takes one tablet (5mg ) every day except on Tuesdays and Fridays, take one-half tablet (2.5mg )        Results for orders placed during the hospital encounter of 07/18/12 (from the past 48 hour(s))  GLUCOSE, CAPILLARY     Status: Abnormal   Collection Time   07/19/12  4:27 PM      Component Value Range Comment   Glucose-Capillary 221 (*) 70 - 99 mg/dL    Comment 1 Notify RN      Comment 2 Documented in Chart     GLUCOSE, CAPILLARY     Status: Abnormal   Collection Time   07/19/12  9:12 PM      Component Value Range Comment   Glucose-Capillary 262 (*) 70 - 99 mg/dL    Comment 1 Notify RN     GLUCOSE, CAPILLARY     Status: Abnormal   Collection Time   07/20/12  7:40 AM      Component Value Range Comment   Glucose-Capillary 133 (*) 70 - 99 mg/dL   GLUCOSE, CAPILLARY     Status: Abnormal   Collection Time   07/20/12 11:45 AM      Component Value Range Comment   Glucose-Capillary 239 (*) 70 - 99 mg/dL    Comment 1 Notify RN     GLUCOSE, CAPILLARY     Status: Abnormal   Collection Time   07/20/12  4:38 PM      Component Value Range Comment   Glucose-Capillary 286 (*) 70 - 99 mg/dL   BASIC METABOLIC PANEL     Status: Abnormal   Collection Time   07/20/12  8:21 PM      Component Value Range Comment   Sodium 133 (*) 135 - 145 mEq/L    Potassium  3.5  3.5 - 5.1 mEq/L DELTA CHECK NOTED   Chloride 97  96 - 112 mEq/L    CO2 26  19 - 32 mEq/L    Glucose, Bld 247 (*) 70 - 99 mg/dL    BUN 61 (*) 6 - 23 mg/dL    Creatinine, Ser 1.61 (*) 0.50 - 1.35 mg/dL    Calcium 8.9  8.4 - 09.6 mg/dL    GFR calc non Af Amer 33 (*) >90 mL/min    GFR calc Af Amer 38 (*) >90 mL/min   MAGNESIUM     Status: Normal   Collection Time   07/20/12  8:21 PM      Component Value Range Comment   Magnesium 2.3  1.5 - 2.5 mg/dL   GLUCOSE, CAPILLARY     Status: Abnormal   Collection Time   07/20/12  9:18 PM      Component Value Range Comment   Glucose-Capillary 236 (*) 70 - 99 mg/dL   BASIC METABOLIC PANEL     Status: Abnormal   Collection Time   07/21/12  5:28 AM      Component Value Range Comment   Sodium 136  135 - 145 mEq/L    Potassium 3.6  3.5 - 5.1 mEq/L    Chloride 100  96 - 112 mEq/L    CO2 25  19 - 32 mEq/L    Glucose, Bld 238 (*) 70 - 99 mg/dL    BUN 56 (*) 6 - 23 mg/dL    Creatinine, Ser 0.45 (*) 0.50 - 1.35 mg/dL    Calcium 9.1  8.4 - 40.9 mg/dL    GFR calc non Af Amer 36 (*) >90 mL/min    GFR calc Af Amer 42 (*) >90 mL/min   PROTIME-INR     Status: Abnormal   Collection Time   07/21/12  5:28 AM      Component Value Range Comment   Prothrombin Time 23.6 (*) 11.6 - 15.2 seconds    INR 2.06 (*) 0.00 - 1.49   CBC     Status: Abnormal   Collection Time   07/21/12  5:28 AM      Component Value Range Comment   WBC 9.7  4.0 - 10.5 K/uL    RBC 3.65 (*) 4.22 - 5.81 MIL/uL    Hemoglobin 10.1 (*) 13.0 - 17.0 g/dL    HCT 81.1 (*) 91.4 - 52.0 %    MCV 86.6  78.0 - 100.0 fL    MCH 27.7  26.0 - 34.0 pg    MCHC 32.0  30.0 - 36.0 g/dL    RDW 78.2 (*) 95.6 - 15.5 %    Platelets 180  150 - 400 K/uL   GLUCOSE, CAPILLARY     Status: Abnormal   Collection Time   07/21/12  7:33 AM      Component Value Range Comment   Glucose-Capillary 194 (*) 70 - 99 mg/dL    Comment 1 Documented in Chart      Comment 2 Notify RN     [place], [place]     Status:  Abnormal   Collection Time   07/21/12 10:26 AM      Component Value Range Comment   Vancomycin Tr 20.1 (*) 10.0 - 20.0 ug/mL   GLUCOSE, CAPILLARY     Status: Abnormal   Collection Time   07/21/12 11:06 AM      Component Value Range Comment   Glucose-Capillary 193 (*) 70 - 99 mg/dL    Comment 1 Documented in Chart      Comment 2 Notify RN      Ct Foot Right  Wo Contrast  07/20/2012  *RADIOLOGY REPORT*  Clinical Data: It is and swelling of the middle toe.  CT OF THE RIGHT FOOT WITHOUT CONTRAST  Technique:  Multidetector CT imaging was performed according to the standard protocol. Multiplanar CT image reconstructions were also generated.  Comparison: Plain films 07/19/2012.  Findings: No bony destructive change of the middle toe is identified to suggest osteomyelitis.  Well corticated erosions are present about the DIP joint of the second toe.  There is also a large erosion at the first MTP joint with extensive calcifications present.  Prominent overhanging edges are present about this erosion and calcifications are identified within it.  No fluid collection is seen.  There is no fracture.  Small erosions are seen in the bases of the fourth and fifth metatarsals with well corticated margins.  There is no fracture.  Intrinsic musculature of the foot is severely atrophic.  No fluid collection is identified.  Calcaneal spurring is noted.  Off the plantar margin of the calcaneus centrally and laterally, there are soft tissue calcifications which are ill-defined. This could be due to old infection or a plantar fibroma.  IMPRESSION:  1.  No evidence of osteomyelitis or abscess. 2.  Findings consistent with gouty arthritis at the IP joint of the second toe and first MTP joint.  There appears to be changes secondary to gout at the fourth and fifth tarsometatarsal joints as well.  Original Report Authenticated By: Bernadene Bell. D'ALESSIO, M.D.   Dg Foot Complete Right  07/19/2012  *RADIOLOGY REPORT*  Clinical Data:  Wounds on both feet.  RIGHT FOOT COMPLETE - 3+ VIEW  Comparison: Plain films of the left foot 05/27/2012.  Findings: Extensive erosive change is present at the first MTP joint and DIP joint of the second toe.  Similar changes are seen on the comparison left foot.  Midfoot degenerative change is identified.  No fracture.  Mild soft tissue swelling is noted.  IMPRESSION:  Large erosions about the first MTP joint and IP joint of the second toe likely due to gout.  No plain film evidence of osteomyelitis is identified.  Original Report Authenticated By: Bernadene Bell. D'ALESSIO, M.D.    ROS: ROS unremarkable 12 point other than outlined above and in chart  Blood pressure 149/73, pulse 65, temperature 98.4 F (36.9 C), temperature source Oral, resp. rate 18, height 6\' 2"  (1.88 m), weight 92.851 kg (204 lb 11.2 oz), SpO2 95.00%. PE: Exam he appears quite debilitated he is oriented x3 but he is in no pain and no overt CHF. Both his legs are wrapped and they were reviewed. He has dry gangrene of his right third toe and necrotic ulcer 70 healing of his left heel. He has pretibial lesions as well. There are absent pedal pulses and trace to +1 edema left greater than right. His chest shows bibasilar dry rales with some with minimal clearing or coughing his ICD as an outpatient post hospital and with Dr. Berna Spare due to for his peripheral arterial disease and ulcerations. is stable in the left chest. The PMI is in the left sixth ICS centimeter outside the [facility] is a short systolic murmur at the left sternal border and there is a defect in the apex. There is mild JVD but he is comfortable at 20-30 per the murmur or rubs. There are soft bilateral carotid bruits. Liver spleen kidney are not palpable femorals are diminished +1/2 on the left +1 on the right. There no pathologic reflexes rash or tremor.  Assessment/Plan [name] has stable coronary disease without chest and he has not had any ICD discharges since the last one  of 07/07/2017 on ice follow ICD interrogation. He has been diarrhea since he's been in the hospital electrolytes and hypokalemia have been reversed. Blood cultures are pending and he is on antibiotic therapy. I would recommend continuation of the same medications at present, wound care therapy. He is to followup with Dr. Berna Spare due to. He may eventually require amputation although the patient did not want to consider this in the past. He has also declined consideration for dialysis in the past for his renal function worsened but that may need to be readdressed in the future. Thank you for taking such the care of Mr. Thorington discussed this with his family.  His ICD is a [employer] secure the DR which is functioning normally. He is programmed to a single-chamber device with PA VF detection at 200 a minute with ATP during charging. He is programmed to backup VVI mode at a rate of 50 and currently has sinus rhythm. He was actually cardioverted with his shock of 06/09/2012 to sinus rhythm which he is maintained. If he has any further ICD shocks he would be a potential candidate for amiodarone therapy for prevention of VF.  If there are any further questions or problems please let us know. He should followup with Dr. Allyson Sabal cardiac-wise posthospitalization and with Dr. Berna Spare due to for his peripheral vascular disease and therapy.    Alanda Amass, Shabre Kreher A 07/21/2012, 12:14 PM

## 2012-07-21 NOTE — Consult Note (Signed)
ANTIBIOTIC CONSULT NOTE   Pharmacy Consult for Vancomycin and Zosyn Indication: cellulitis, diabetic foot infxn, possible osteomyelitis  No Known Allergies  Patient Measurements: Height: 6\' 2"  (188 cm) Weight: 204 lb 11.2 oz (92.851 kg) IBW/kg (Calculated) : 82.2   Vital Signs: Temp: 98.4 F (36.9 C) (08/19 0606) Temp src: Oral (08/19 0606) BP: 149/73 mmHg (08/19 0606) Pulse Rate: 65  (08/19 0606) Intake/Output from previous day: 08/18 0701 - 08/19 0700 In: 2010 [P.O.:1260; IV Piggyback:750] Out: 1675 [Urine:1675] Intake/Output from this shift: Total I/O In: 120 [P.O.:120] Out: -   Labs:  Basename 07/21/12 0528 07/20/12 2021 07/19/12 0613 07/18/12 1826  WBC 9.7 -- 14.9* 23.8*  HGB 10.1* -- 10.3* 11.4*  PLT 180 -- 184 196  LABCREA -- -- -- --  CREATININE 1.78* 1.90* 2.31* --   Estimated Creatinine Clearance: 42.3 ml/min (by C-G formula based on Cr of 1.78).  Basename 07/21/12 1026  VANCOTROUGH 20.1*  VANCOPEAK --  VANCORANDOM --  GENTTROUGH --  GENTPEAK --  GENTRANDOM --  TOBRATROUGH --  TOBRAPEAK --  TOBRARND --  AMIKACINPEAK --  AMIKACINTROU --  AMIKACIN --    Microbiology: Recent Results (from the past 720 hour(s))  CULTURE, BLOOD (ROUTINE X 2)     Status: Normal (Preliminary result)   Collection Time   07/18/12  7:16 PM      Component Value Range Status Comment   Specimen Description LEFT ANTECUBITAL   Final    Special Requests BOTTLES DRAWN AEROBIC AND ANAEROBIC 12CC   Final    Culture NO GROWTH 2 DAYS   Final    Report Status PENDING   Incomplete   CULTURE, BLOOD (ROUTINE X 2)     Status: Normal (Preliminary result)   Collection Time   07/18/12  7:22 PM      Component Value Range Status Comment   Specimen Description BLOOD RIGHT HAND   Final    Special Requests BOTTLES DRAWN AEROBIC AND ANAEROBIC 7CC   Final    Culture NO GROWTH 2 DAYS   Final    Report Status PENDING   Incomplete    Medical History: Past Medical History  Diagnosis Date  .  Shortness of breath   . Anemia   . Chronic kidney disease     renal insufficiency  . CHF (congestive heart failure)   . Dysrhythmia     atrial fibrilation  . Myocardial infarction   . COPD (chronic obstructive pulmonary disease)   . Diabetes mellitus   . Coronary artery disease   . Gout 05/28/2012  . Paroxysmal atrial fibrillation 11/05/2011    permanent   . Hypothyroidism 05/28/2012  . ICD (implantable cardiac defibrillator) discharge 06/11/2012  . Acute on chronic renal failure 06/11/2012  . ICD (implantable cardiac defibrillator) in place   . Chronic systolic CHF (congestive heart failure) 06/11/2012   Medications:  Scheduled:     . allopurinol  100 mg Oral Daily  . antiseptic oral rinse  15 mL Mouth Rinse BID  . carvedilol  12.5 mg Oral BID WC  . furosemide  80 mg Oral BID  . insulin aspart  0-15 Units Subcutaneous TID WC  . insulin aspart  0-5 Units Subcutaneous QHS  . insulin glargine  22 Units Subcutaneous BID  . levothyroxine  50 mcg Oral QAC breakfast  . piperacillin-tazobactam (ZOSYN)  IV  3.375 g Intravenous Q8H  . simvastatin  40 mg Oral QHS  . sodium chloride  3 mL Intravenous Q12H  . sodium chloride      .  vancomycin  1,500 mg Intravenous Q24H  . warfarin  2.5 mg Oral Custom  . warfarin  5 mg Oral Custom  . Warfarin - Physician Dosing Inpatient   Does not apply q1800  . DISCONTD: insulin glargine  20 Units Subcutaneous BID  . DISCONTD: potassium chloride  40 mEq Oral BID   Assessment: 76yo with elevated SCr, poor renal fxn admitted with possible osteomyelitis, diabetic foot infxn SCr is improving with hydration Estimated Creatinine Clearance: 42.3 ml/min (by C-G formula based on Cr of 1.78). Trough level at upper end of therapeutic range but renal fxn is improving.  Goal of Therapy:  Vancomycin trough level 15-20 mcg/ml  Plan: Continue Zosyn 3.375gm iv q8hrs Continue Vancomycin 1500mg  iv q24hrs Check trough weekly while on Vancomycin Monitor SCr  closely, adjust Vanco dose if increases Monitor labs, renal fxn, and cultures per protocol  Valrie Hart A 07/21/2012,11:34 AM

## 2012-07-22 DIAGNOSIS — I4891 Unspecified atrial fibrillation: Secondary | ICD-10-CM

## 2012-07-22 DIAGNOSIS — D649 Anemia, unspecified: Secondary | ICD-10-CM

## 2012-07-22 LAB — GLUCOSE, CAPILLARY
Glucose-Capillary: 119 mg/dL — ABNORMAL HIGH (ref 70–99)
Glucose-Capillary: 242 mg/dL — ABNORMAL HIGH (ref 70–99)
Glucose-Capillary: 224 mg/dL — ABNORMAL HIGH (ref 70–99)

## 2012-07-22 LAB — CBC
HCT: 32.5 % — ABNORMAL LOW (ref 39.0–52.0)
MCH: 28.1 pg (ref 26.0–34.0)
MCV: 86.2 fL (ref 78.0–100.0)
RBC: 3.77 MIL/uL — ABNORMAL LOW (ref 4.22–5.81)
WBC: 11.3 10*3/uL — ABNORMAL HIGH (ref 4.0–10.5)

## 2012-07-22 MED ORDER — LEVOFLOXACIN 500 MG PO TABS
500.0000 mg | ORAL_TABLET | Freq: Every day | ORAL | Status: DC
  Administered 2012-07-22 – 2012-07-23 (×2): 500 mg via ORAL
  Filled 2012-07-22 (×2): qty 1

## 2012-07-22 MED ORDER — HYDRALAZINE HCL 25 MG PO TABS
25.0000 mg | ORAL_TABLET | Freq: Three times a day (TID) | ORAL | Status: DC
  Administered 2012-07-22 – 2012-07-23 (×4): 25 mg via ORAL
  Filled 2012-07-22 (×4): qty 1

## 2012-07-22 MED ORDER — CARVEDILOL 12.5 MG PO TABS
25.0000 mg | ORAL_TABLET | Freq: Two times a day (BID) | ORAL | Status: DC
  Administered 2012-07-22 – 2012-07-23 (×2): 25 mg via ORAL
  Filled 2012-07-22 (×2): qty 2

## 2012-07-22 MED ORDER — ISOSORBIDE DINITRATE 20 MG PO TABS
20.0000 mg | ORAL_TABLET | Freq: Three times a day (TID) | ORAL | Status: DC
  Administered 2012-07-22 – 2012-07-23 (×4): 20 mg via ORAL
  Filled 2012-07-22 (×5): qty 1

## 2012-07-22 NOTE — Progress Notes (Signed)
[facility] PROGRESS NOTE   ZOX:[id number] DOB: Jun 20, 1935 DOA: 07/18/2012 PCP: Colette Ribas, MD Cardiologist--Mihai Croitoru, MD Orthopedic Filiberto Pinks, MD  Assessment/Plan: 1. Sepsis, present on admission--secondary to right toe cellulitis. Clinically resolved; hemodynamically stable (hypotension resolved),  . will change to  by mouth Levaquin  and possible discharge tomorrow. 2. Right middle toe cellulitis, diabetic--x-ray negative; CT negative for osteomyelitis could not get MRI with AICD). Continue antibiotics. 3. Large left heel ulcer, diabetic--wound bed appears without gross infection; minimal exudate, no pus; no surrounding erythema, non-tender. Wound care per WOC. 4. AICD--by report, this fired at home prior to arrival (patient instructed by cardiology to come to ED). No activity as inpatient.   Cardiology consult note noted 5. Chest pain--present since fall (prior to admission). Troponin negative . Atypical, constant for several days. EKG--afib, multiple PVCs, LBBB (old). No signs, symptoms or history to suggest ACS. 6. Acute renal failure/dehydration superimposed on CKD III-IV--improving 7. Hyponatremia--Resolved. Likely multifactorial--dehydration, diuretics.  8. Hypokalemia--Resolved. 9. DM type 2, controlled--continue SSI, Lantus. 10. Anemia of chronic disease---at baseline. 11. Laceration left eyebrow repaired 8/13--sutures out yesterday. 12. History of CAD, CABG, LBBB--appears stable.  Zocor. Blood pressure elevated. Restarted Imdur, hydralazine and increased Coreg to home dose. 13. History of ischemic cardiomyopathy, AICD placement--appears stable. 14. Combined systolic & diastolic dysfunction--compensated. Continue Coreg. Restarted Lasix  Follow daily weights, I&O;  15. History of atrial fibrillation--continue warfarin.  Code Status: DNR Family Communication: none present Disposition Plan: pending further evaluation and response to treatment  Molli Posey, MD  [facility] Team 2 Pager [phone].  If 7PM-7AM, please contact night-coverage at [email or web address], password [facility] 07/21/2012, 10:46 AM  LOS: 3 days   Brief narrative: 76 yom sent to ED by cards office for firing of AICD. Pt has been sick for several weeks with decrease po intake. Saw Dr. Lajoyce Corners (ortho) 2 days ago prior to admission and was diagnosed with probable osteomyelitis and placed on doxycycline po. Over that week period the wound progressed, the toe became more swollen and started draining pus and more red. Pt found to be mildly hypotensive in the ED and febrile with significant leukocytosis and worsening renal failure.   Consultants:  Orthopedics  Procedures:    Antibiotics:  Zosyn 8/16>> 8/20  Vancomycin 8/17>>8/20  HPI/Subjective:  Patient does not like the boot recommended by wound care. He does not  Want to wear them.  Objective: Filed Vitals:   07/20/12 1951 07/20/12 2120 07/21/12 0606 07/21/12 0728  BP:  137/93 149/73   Pulse:  68 65   Temp:  98.7 F (37.1 C) 98.4 F (36.9 C)   TempSrc:  Oral Oral   Resp:  18 18   Height:      Weight:   92.851 kg (204 lb 11.2 oz)   SpO2: 99% 98% 94% 95%    Intake/Output Summary (Last 24 hours) at 07/21/12 1046 Last data filed at 07/21/12 0815  Gross per 24 hour  Intake   1890 ml  Output   1375 ml  Net    515 ml   Exam:   General:  Appears calm and comfortable.   ENT: hard of hearing  Cardiovascular: irregular, normal rate; no m/r/g. 2+ bilateral lower extremity edema.  Respiratory: CTA bilaterally, no w/r/r. Normal respiratory effort.  Abdomen: soft, non-tender, non-distended  Skin: right foot: 3rd toe mild erythema, non-tender; ulcer at tip without pus. LLE: foot--large round heel ulcer without frank pus; no surrounding erythema,  good granulation tissue. Abrasions lower leg noted,  without evidence of infection.  Psychiatric: speech fluent and clear; mood and affect grossly normal  Data  Reviewed: Basic Metabolic Panel:  Lab 07/21/12 1610 07/20/12 2021 07/19/12 0613 07/18/12 1916 07/18/12 1826  NA 136 133* 130* -- 130*  K 3.6 3.5 2.9* -- 3.6  CL 100 97 91* -- 88*  CO2 25 26 26  -- 27  GLUCOSE 238* 247* 186* -- 202*  BUN 56* 61* 71* -- 75*  CREATININE 1.78* 1.90* 2.31* -- 2.54*  CALCIUM 9.1 8.9 9.2 -- 9.5  MG -- 2.3 -- 2.1 --  PHOS -- -- -- -- --   CBC:  Lab 07/21/12 0528 07/19/12 0613 07/18/12 1826  WBC 9.7 14.9* 23.8*  NEUTROABS -- -- --  HGB 10.1* 10.3* 11.4*  HCT 31.6* 31.6* 34.9*  MCV 86.6 86.6 86.6  PLT 180 184 196   Cardiac Enzymes:  Lab 07/18/12 1826  CKTOTAL --  CKMB --  CKMBINDEX --  TROPONINI <0.30   CBG:  Lab 07/21/12 0733 07/20/12 2118 07/20/12 1638 07/20/12 1145 07/20/12 0740  GLUCAP 194* 236* 286* 239* 133*    Recent Results (from the past 240 hour(s))  CULTURE, BLOOD (ROUTINE X 2)     Status: Normal (Preliminary result)   Collection Time   07/18/12  7:16 PM      Component Value Range Status Comment   Specimen Description LEFT ANTECUBITAL   Final    Special Requests BOTTLES DRAWN AEROBIC AND ANAEROBIC 12CC   Final    Culture NO GROWTH 2 DAYS   Final    Report Status PENDING   Incomplete   CULTURE, BLOOD (ROUTINE X 2)     Status: Normal (Preliminary result)   Collection Time   07/18/12  7:22 PM      Component Value Range Status Comment   Specimen Description BLOOD RIGHT HAND   Final    Special Requests BOTTLES DRAWN AEROBIC AND ANAEROBIC 7CC   Final    Culture NO GROWTH 2 DAYS   Final    Report Status PENDING   Incomplete      Studies: Chest Portable 1 View  07/18/2012  *RADIOLOGY REPORT*  IMPRESSION:  1.  Cardiomegaly and vascular congestion. 2.  No focal consolidations.  Original Report Authenticated By: Patterson Hammersmith, M.D.   Dg Foot Complete Right  07/19/2012  *RADIOLOGY REPORT*  IMPRESSION:  Large erosions about the first MTP joint and IP joint of the second toe likely due to gout.  No plain film evidence of  osteomyelitis is identified.  Original Report Authenticated By: Bernadene Bell. D'ALESSIO, M.D.   Scheduled Meds:    . allopurinol  100 mg Oral Daily  . antiseptic oral rinse  15 mL Mouth Rinse BID  . carvedilol  12.5 mg Oral BID WC  . furosemide  80 mg Oral BID  . insulin aspart  0-15 Units Subcutaneous TID WC  . insulin aspart  0-5 Units Subcutaneous QHS  . insulin glargine  20 Units Subcutaneous BID  . levothyroxine  50 mcg Oral QAC breakfast  . piperacillin-tazobactam (ZOSYN)  IV  3.375 g Intravenous Q8H  . simvastatin  40 mg Oral QHS  . sodium chloride  3 mL Intravenous Q12H  . sodium chloride      . vancomycin  1,500 mg Intravenous Q24H  . warfarin  2.5 mg Oral Custom  . warfarin  5 mg Oral Custom  . Warfarin - Physician Dosing Inpatient   Does not apply q1800  . DISCONTD: potassium chloride  40 mEq Oral BID   Continuous Infusions:      Molli Posey, MD  [facility] Pager [phone]. If 7PM-7AM, please contact night-coverage at [email or web address], password [facility] 07/21/2012, 10:46 AM  LOS: 3 days   Time spent: 25 minutes

## 2012-07-22 NOTE — Progress Notes (Signed)
UR Chart Review Completed  

## 2012-07-22 NOTE — Progress Notes (Signed)
PT Cancellation Note  Treatment cancelled today due to patient's refusal to participate.  Lurena Nida, PTA/CLT 07/22/2012, 1:53 PM

## 2012-07-23 LAB — CBC
MCH: 28.4 pg (ref 26.0–34.0)
MCV: 85.9 fL (ref 78.0–100.0)
Platelets: 178 10*3/uL (ref 150–400)
RDW: 17 % — ABNORMAL HIGH (ref 11.5–15.5)

## 2012-07-23 LAB — CULTURE, BLOOD (ROUTINE X 2)
Culture: NO GROWTH
Culture: NO GROWTH

## 2012-07-23 LAB — BASIC METABOLIC PANEL
BUN: 51 mg/dL — ABNORMAL HIGH (ref 6–23)
Calcium: 9.6 mg/dL (ref 8.4–10.5)
Creatinine, Ser: 1.88 mg/dL — ABNORMAL HIGH (ref 0.50–1.35)
GFR calc Af Amer: 39 mL/min — ABNORMAL LOW (ref >90)
GFR calc non Af Amer: 34 mL/min — ABNORMAL LOW (ref >90)

## 2012-07-23 LAB — GLUCOSE, CAPILLARY: Glucose-Capillary: 163 mg/dL — ABNORMAL HIGH (ref 70–99)

## 2012-07-23 MED ORDER — LEVOFLOXACIN 500 MG PO TABS: 500.0000 mg | ORAL_TABLET | Freq: Every day | ORAL | Status: AC

## 2012-07-23 MED ORDER — POTASSIUM CHLORIDE ER 10 MEQ PO TBCR: 40.0000 meq | EXTENDED_RELEASE_TABLET | Freq: Every day | ORAL | Status: DC

## 2012-07-23 NOTE — Discharge Summary (Addendum)
Physician Discharge Summary  AVEDIS BEVIS ZOX:[id number] DOB: [birth year] DOA: 07/18/2012  PCP: Colette Ribas, MD  Admit date: 07/18/2012 Discharge date: 07/23/2012  Recommendations for Outpatient Follow-up:  1. Continue home health follow up with RN 2. Follow up with Dr. Lajoyce Corners next available appointment 3. Follow up with Dr. Allyson Sabal in 1-2 weeks 4. Follow up with Dr. Phillips Odor in 2 weeks  Discharge Diagnoses:  Principal Problem:  *Sepsis Active Problems:  Hx of CABG x 6 2003  ICD (implantable cardiac defibrillator) in place  Chronic a-fib  Diabetes mellitus type 2, insulin dependent  CKD (chronic kidney disease) stage 4, GFR 15-29 ml/min  Anemia due to chronic illness  Ischemic cardiomyopathy, EF 25% 2D 12/12  PVD, LLE PVA 06/13/12- for LLE PTA 7/16  Diabetic foot infection, just discharged 05/30/12  Acute on chronic renal failure, SCr 2.3-3.3 secondary to dehydration  Ulcer of left heel  Chest pain  Hyponatremia   Discharge Condition: improved  Diet recommendation: low salt, low carb  Filed Weights   07/21/12 0606 07/22/12 0500 07/23/12 0448  Weight: 92.851 kg (204 lb 11.2 oz) 93.2 kg (205 lb 7.5 oz) 91.128 kg (200 lb 14.4 oz)    History of present illness:  77 yo male with MMP sent in by cards office for firing of aicd that pt did not feel. Pt has been sick for several weeks with decrease po intake. He spoke dr berry last Monday (over a week ago) and was noted to have a new wound on his rt middle toe that was developing and he was told to see his foot doctor. He saw dr Lajoyce Corners (ortho) on wed 2 days ago and was diagnosed with probable osteomyelitis and placed on doxycycline po. Over that week period the wound progressed, the toe became more swollen and started draining pus and more red. Pt has not been complaining of anything (does not complain at all) but family was called this am by cards office that his aicd went off. Pt denies feeling this happened. Pt found to be mildly  hypotensive in the ED and febrile with significant leukocytosis and worsening renal failure. Pt denies any cp/sob/n/v/d/dysuria /cough. Says his rt 3rd toe has been painful. Has not moved around much in last 2 weeks and is basically bedbound recently.    Hospital Course:  Patient was admitted to the hospital with worsening cellulitis of his left third toe. There was concern for underlying osteomyelitis. He was started on empiric biotics and imaging including CT of the foot did not reveal any evidence of ostial mellitus. Empiric antibiotic treatment the patient improved significantly. He is now afebrile. His WBC count has improved. He has been changed over to oral antibiotics and will complete a total of 10 days. He'll schedule followup with his orthopedic surgeon Dr. Lajoyce Corners to be seen as an outpatient.  Patient also came to the hospital since he was told that his AICD was firing. He was evaluated by cardiology and recommendations were to continue current medications. If he has any further ICD shocks, then he would be a potential candidate for amiodarone therapy to prevent ventricular fibrillation. Patient feels back to baseline and he will followup with his orthopedic surgeon as well as cardiologist as an outpatient. He was evaluated by physical therapy and recommended home health physical therapy, but he has declined this. We will continue home health nursing as he has previously been receiving.  Procedures:  none  Consultations:  Dr. Alanda Amass, cardiology  Wound care  Discharge Exam:  Filed Vitals:   07/23/12 1309  BP: 120/70  Pulse: 65  Temp: 98.3 F (36.8 C)  Resp: 19   Filed Vitals:   07/22/12 1422 07/22/12 2140 07/23/12 0448 07/23/12 1309  BP: 137/65 132/62 170/70 120/70  Pulse: 65 67 71 65  Temp: 97.9 F (36.6 C) 98.4 F (36.9 C) 98.3 F (36.8 C) 98.3 F (36.8 C)  TempSrc:  Oral Oral   Resp: 20 20 20 19   Height:      Weight:   91.128 kg (200 lb 14.4 oz)   SpO2: 98% 97% 96%  98%    General: NAD Cardiovascular: S1, s2, rrr Respiratory: cta b Skin: Left foot, third toe, erythema appears to be improved. Swelling is also improved. There is a small ulcer on the dorsal aspect of the tip of the digit. There is no purulent drainage noted from this and blood flow appears to be intact. No foul smell.  Discharge Instructions  Discharge Orders    Future Orders Please Complete By Expires   Diet - low sodium heart healthy      Diet Carb Modified      Increase activity slowly      Call MD for:  temperature >100.4      Call MD for:  severe uncontrolled pain      Call MD for:  redness, tenderness, or signs of infection (pain, swelling, redness, odor or green/yellow discharge around incision site)        Medication List  As of 07/23/2012  1:48 PM   TAKE these medications         allopurinol 100 MG tablet   Commonly known as: ZYLOPRIM   Take 1 tablet (100 mg total) by mouth daily.      carvedilol 25 MG tablet   Commonly known as: COREG   Take 1 tablet (25 mg total) by mouth 2 (two) times daily with a meal.      furosemide 80 MG tablet   Commonly known as: LASIX   Take 80 mg by mouth 2 (two) times daily.      hydrALAZINE 25 MG tablet   Commonly known as: APRESOLINE   Take 25 mg by mouth 3 (three) times daily.      insulin aspart 100 UNIT/ML injection   Commonly known as: novoLOG   Inject 10-15 Units into the skin 3 (three) times daily before meals.      insulin glargine 100 UNIT/ML injection   Commonly known as: LANTUS   Inject 10 Units into the skin 2 (two) times daily.      isosorbide dinitrate 20 MG tablet   Commonly known as: ISORDIL   Take 20 mg by mouth 3 (three) times daily.      levofloxacin 500 MG tablet   Commonly known as: LEVAQUIN   Take 1 tablet (500 mg total) by mouth daily.      levothyroxine 50 MCG tablet   Commonly known as: SYNTHROID, LEVOTHROID   Take 50 mcg by mouth every morning.      metolazone 2.5 MG tablet   Commonly known  as: ZAROXOLYN   Take 2.5 mg by mouth daily. States he takes with his furosemide every day except on Tuesday and Friday      omeprazole 20 MG capsule   Commonly known as: PRILOSEC   Take 40 mg by mouth every morning.      potassium chloride 10 MEQ tablet   Commonly known as: K-DUR   Take 4 tablets (40  mEq total) by mouth daily.      simvastatin 40 MG tablet   Commonly known as: ZOCOR   Take 40 mg by mouth at bedtime.      warfarin 5 MG tablet   Commonly known as: COUMADIN   Take 2.5-5 mg by mouth every evening. Takes one tablet (5mg ) every day except on Tuesdays and Fridays, take one-half tablet (2.5mg )         ASK your doctor about these medications         doxycycline 100 MG tablet   Commonly known as: VIBRA-TABS   Take 100 mg by mouth 2 (two) times daily.           Follow-up Information    Follow up with [facility]. [facility] RN and PT, [phone])       Follow up with DUDA,MARCUS V, MD. (follow up next available appointment)    Contact information:   [address] [place] [address] [phone]       Follow up with Runell Gess, MD. Schedule an appointment as soon as possible for a visit in 1 week.   Contact information:   [address] [place] [address] [phone]           The results of significant diagnostics from this hospitalization (including imaging, microbiology, ancillary and laboratory) are listed below for reference.    Significant Diagnostic Studies: Ct Head Wo Contrast  07/15/2012  *RADIOLOGY REPORT*  Clinical Data: Fall.  Laceration above left eye  CT HEAD WITHOUT CONTRAST  Technique:  Contiguous axial images were obtained from the base of the skull through the vertex without contrast.  Comparison: CT 05/21/2008  Findings: Generalized atrophy has progressed.  Chronic microvascular ischemia in the white matter has progressed.  No acute infarct or mass.  No intracranial  hemorrhage.  Soft tissue swelling and laceration over the left eye.  No fracture is present.  IMPRESSION: Atrophy and chronic microvascular ischemic changes have progressed since 2009.  No acute intracranial abnormality.  Original Report Authenticated By: Camelia Phenes, M.D.   Ct Foot Right Wo Contrast  07/20/2012  *RADIOLOGY REPORT*  Clinical Data: It is and swelling of the middle toe.  CT OF THE RIGHT FOOT WITHOUT CONTRAST  Technique:  Multidetector CT imaging was performed according to the standard protocol. Multiplanar CT image reconstructions were also generated.  Comparison: Plain films 07/19/2012.  Findings: No bony destructive change of the middle toe is identified to suggest osteomyelitis.  Well corticated erosions are present about the DIP joint of the second toe.  There is also a large erosion at the first MTP joint with extensive calcifications present.  Prominent overhanging edges are present about this erosion and calcifications are identified within it.  No fluid collection is seen.  There is no fracture.  Small erosions are seen in the bases of the fourth and fifth metatarsals with well corticated margins.  There is no fracture.  Intrinsic musculature of the foot is severely atrophic.  No fluid collection is identified.  Calcaneal spurring is noted.  Off the plantar margin of the calcaneus centrally and laterally, there are soft tissue calcifications which are ill-defined. This could be due to old infection or a plantar fibroma.  IMPRESSION:  1.  No evidence of osteomyelitis or abscess. 2.  Findings consistent with gouty arthritis at the IP joint of the second toe and first MTP joint.  There appears to be changes secondary to gout  at the fourth and fifth tarsometatarsal joints as well.  Original Report Authenticated By: Bernadene Bell. Maricela Curet, M.D.   Chest Portable 1 View  07/18/2012  *RADIOLOGY REPORT*  Clinical Data: Hypertension.  Chest pain.  PORTABLE CHEST - 1 VIEW  Comparison: 06/11/2012   Findings: Left-sided AICD has leads overlying the right atrium and right ventricle.  Heart is enlarged.  There is mild pulmonary vascular congestion but no overt edema.  No focal consolidations or pleural effusions are seen.  Patient has had median sternotomy and CABG.  IMPRESSION:  1.  Cardiomegaly and vascular congestion. 2.  No focal consolidations.  Original Report Authenticated By: Patterson Hammersmith, M.D.   Dg Foot Complete Right  07/19/2012  *RADIOLOGY REPORT*  Clinical Data: Wounds on both feet.  RIGHT FOOT COMPLETE - 3+ VIEW  Comparison: Plain films of the left foot 05/27/2012.  Findings: Extensive erosive change is present at the first MTP joint and DIP joint of the second toe.  Similar changes are seen on the comparison left foot.  Midfoot degenerative change is identified.  No fracture.  Mild soft tissue swelling is noted.  IMPRESSION:  Large erosions about the first MTP joint and IP joint of the second toe likely due to gout.  No plain film evidence of osteomyelitis is identified.  Original Report Authenticated By: Bernadene Bell. Maricela Curet, M.D.    Microbiology: Recent Results (from the past 240 hour(s))  CULTURE, BLOOD (ROUTINE X 2)     Status: Normal   Collection Time   07/18/12  7:16 PM      Component Value Range Status Comment   Specimen Description BLOOD LEFT ANTECUBITAL   Final    Special Requests BOTTLES DRAWN AEROBIC AND ANAEROBIC 12CC   Final    Culture NO GROWTH 5 DAYS   Final    Report Status 07/23/2012 FINAL   Final   CULTURE, BLOOD (ROUTINE X 2)     Status: Normal   Collection Time   07/18/12  7:22 PM      Component Value Range Status Comment   Specimen Description BLOOD RIGHT HAND   Final    Special Requests BOTTLES DRAWN AEROBIC AND ANAEROBIC [facility]   Final    Culture NO GROWTH 5 DAYS   Final    Report Status 07/23/2012 FINAL   Final      Labs: Basic Metabolic Panel:  Lab 07/23/12 1610 07/21/12 0528 07/20/12 2021 07/19/12 0613 07/18/12 1916 07/18/12 1826  NA 140 136  133* 130* -- 130*  K 3.4* 3.6 3.5 2.9* -- 3.6  CL 102 100 97 91* -- 88*  CO2 27 25 26 26  -- 27  GLUCOSE 187* 238* 247* 186* -- 202*  BUN 51* 56* 61* 71* -- 75*  CREATININE 1.88* 1.78* 1.90* 2.31* -- 2.54*  CALCIUM 9.6 9.1 8.9 9.2 -- 9.5  MG -- -- 2.3 -- 2.1 --  PHOS -- -- -- -- -- --   Liver Function Tests: No results found for this basename: AST:5,ALT:5,ALKPHOS:5,BILITOT:5,PROT:5,ALBUMIN:5 in the last 168 hours No results found for this basename: LIPASE:5,AMYLASE:5 in the last 168 hours No results found for this basename: AMMONIA:5 in the last 168 hours CBC:  Lab 07/23/12 0521 07/22/12 0530 07/21/12 0528 07/19/12 0613 07/18/12 1826  WBC 10.7* 11.3* 9.7 14.9* 23.8*  NEUTROABS -- -- -- -- --  HGB 10.9* 10.6* 10.1* 10.3* 11.4*  HCT 33.0* 32.5* 31.6* 31.6* 34.9*  MCV 85.9 86.2 86.6 86.6 86.6  PLT 178 188 180 184 196  Cardiac Enzymes:  Lab 07/18/12 1826  CKTOTAL --  CKMB --  CKMBINDEX --  TROPONINI <0.30   BNP: BNP (last 3 results)  Basename 11/14/11 0610 11/12/11 0545 11/10/11 0500  PROBNP 4426.0* 5128.0* 5253.0*   CBG:  Lab 07/23/12 1116 07/23/12 0715 07/22/12 2142 07/22/12 1634 07/22/12 1112  GLUCAP 184* 163* 226* 224* 242*    Time coordinating discharge: Greater than 30 minutes  Signed:  Kenisha Lynds  [facility] 07/23/2012, 1:48 PM

## 2012-07-23 NOTE — Progress Notes (Signed)
Physical Therapy Treatment Patient Details Name: [name] MRN: [id number] DOB: 1935/02/02 Today's Date: 07/23/2012 Time:  -     PT Assessment / Plan / Recommendation    Patient refused PT this morning stating "I want the hell out of here, they have told me I'm going home today" I told patient I would verify he was being being d/c and if not I would be back to attempt therapy again.  Evita Merida ATKINSO 07/23/2012, 10:05 AM

## 2012-07-23 NOTE — Progress Notes (Signed)
Pt discharged home today per Dr. Kerry Hough.  Pts IV site d/c'd and within normal limits. Pts vital signs stable at this time.  Pt provided with discharge instructions and rx which he refused explanation. Pt states "I told you I wasn't interested in that damn shit." Pt left floor via wheelchair in stable condition accompanied by nurse tech.

## 2012-07-23 NOTE — Progress Notes (Signed)
Inpatient Diabetes Program Recommendations  AACE/ADA: New Consensus Statement on Inpatient Glycemic Control (2013)  Target Ranges:  Prepandial:   less than 140 mg/dL      Peak postprandial:   less than 180 mg/dL (1-2 hours)      Critically ill patients:  140 - 180 mg/dL   Reason for Visit: Results for [name], [name] (MRN [id number]) as of 07/23/2012 10:55  Ref. Range 07/22/2012 11:12 07/22/2012 16:34 07/22/2012 21:42 07/23/2012 05:21 07/23/2012 07:15  Glucose-Capillary Latest Range: 70-99 mg/dL 409 (H) 811 (H) 914 (H)  163 (H)       Note: May consider adding Novolog meal coverage 3 units tid with meals-Hold if patient eats less than 50%.

## 2012-07-23 NOTE — Progress Notes (Signed)
[facility]  DAILY PROGRESS NOTE   Subjective:  He feels much better. Hemodynamic instability has resolved.potassium level is still borderline low at 3.4.creatinine is approximately 1.9 very close to his baseline. There are plans for him to be discharged today  Objective:  Temp:  [97.9 F (36.6 C)-98.4 F (36.9 C)] 98.3 F (36.8 C) (08/21 1309) Pulse Rate:  [65-71] 65  (08/21 1309) Resp:  [19-20] 19  (08/21 1309) BP: (120-170)/(62-70) 120/70 mmHg (08/21 1309) SpO2:  [96 %-98 %] 98 % (08/21 1309) Weight:  [91.128 kg (200 lb 14.4 oz)] 91.128 kg (200 lb 14.4 oz) (08/21 0448) Weight change: -2.072 kg (-4 lb 9.1 oz)  Intake/Output from previous day: 08/20 0701 - 08/21 0700 In: 600 [P.O.:600] Out: 1950 [Urine:1950]  Intake/Output from this shift: Total I/O In: 360 [P.O.:360] Out: 1100 [Urine:1100]  Medications: Current Facility-Administered Medications  Medication Dose Route Frequency Provider Last Rate Last Dose  . acetaminophen (TYLENOL) tablet 500 mg  500 mg Oral Q6H PRN Tarry Kos, MD   500 mg at 07/19/12 2157  . allopurinol (ZYLOPRIM) tablet 100 mg  100 mg Oral Daily Tarry Kos, MD   100 mg at 07/23/12 0959  . antiseptic oral rinse (BIOTENE) solution 15 mL  15 mL Mouth Rinse BID Tarry Kos, MD   15 mL at 07/22/12 2000  . carvedilol (COREG) tablet 25 mg  25 mg Oral BID WC Alinda Money, MD   25 mg at 07/23/12 0959  . furosemide (LASIX) tablet 80 mg  80 mg Oral BID Alinda Money, MD   80 mg at 07/23/12 1000  . hydrALAZINE (APRESOLINE) tablet 25 mg  25 mg Oral TID Alinda Money, MD   25 mg at 07/23/12 1000  . HYDROcodone-acetaminophen (NORCO/VICODIN) 5-325 MG per tablet 1-2 tablet  1-2 tablet Oral Q4H PRN Tarry Kos, MD   2 tablet at 07/20/12 2323  . insulin aspart (novoLOG) injection 0-15 Units  0-15 Units Subcutaneous TID WC Marinda Elk, MD   3 Units at 07/23/12 1225  . insulin aspart (novoLOG) injection 0-5 Units  0-5 Units  Subcutaneous QHS Marinda Elk, MD   2 Units at 07/22/12 2203  . insulin glargine (LANTUS) injection 22 Units  22 Units Subcutaneous BID Alinda Money, MD   22 Units at 07/23/12 [phone]  . isosorbide dinitrate (ISORDIL) tablet 20 mg  20 mg Oral TID Alinda Money, MD   20 mg at 07/23/12 0959  . levofloxacin (LEVAQUIN) tablet 500 mg  500 mg Oral Daily Alinda Money, MD   500 mg at 07/23/12 0959  . levothyroxine (SYNTHROID, LEVOTHROID) tablet 50 mcg  50 mcg Oral QAC breakfast Tarry Kos, MD   50 mcg at 07/23/12 1000  . morphine 2 MG/ML injection 1 mg  1 mg Intravenous Q4H PRN Tarry Kos, MD   1 mg at 07/21/12 1728  . nitroGLYCERIN (NITROSTAT) SL tablet 0.4 mg  0.4 mg Sublingual Q5 Min x 3 PRN Standley Brooking, MD   0.4 mg at 07/20/12 0948  . ondansetron (ZOFRAN) tablet 4 mg  4 mg Oral Q6H PRN Tarry Kos, MD       Or  . ondansetron [facility]) injection 4 mg  4 mg Intravenous Q6H PRN Tarry Kos, MD      . simvastatin (ZOCOR) tablet 40 mg  40 mg Oral QHS Tarry Kos, MD   40 mg at 07/22/12 2202  . sodium chloride 0.9 % injection 3 mL  3 mL Intravenous Q12H Tarry Kos, MD   3 mL at 07/23/12 1000  . warfarin (COUMADIN) tablet 2.5 mg  2.5 mg Oral Custom Tarry Kos, MD   2.5 mg at 07/22/12 1718  . warfarin (COUMADIN) tablet 5 mg  5 mg Oral Custom Tarry Kos, MD   5 mg at 07/21/12 1719  . Warfarin - Physician Dosing Inpatient   Does not apply q1800 Tarry Kos, MD        Physical Exam: General appearance: alert, cooperative and no distress Neck: no adenopathy, no carotid bruit, no JVD, supple, symmetrical, trachea midline and thyroid not enlarged, symmetric, no tenderness/mass/nodules Lungs: clear to auscultation bilaterally Heart: regular baseline rhythm with frequent irregularities. Normal first and second heart sounds  normal; grade 2/6 holosystolic murmur. Abdomen: soft, non-tender; bowel sounds normal; no masses,  no organomegaly Extremities: clean dressing over the area of  cellulitis/osteomyelitis of the right foot Neurologic: Grossly normal  Lab Results: Results for orders placed during the hospital encounter of 07/18/12 (from the past 48 hour(s))  GLUCOSE, CAPILLARY     Status: Abnormal   Collection Time   07/21/12  4:33 PM      Component Value Range Comment   Glucose-Capillary 221 (*) 70 - 99 mg/dL    Comment 1 Documented in Chart      Comment 2 Notify RN     GLUCOSE, CAPILLARY     Status: Abnormal   Collection Time   07/21/12  9:11 PM      Component Value Range Comment   Glucose-Capillary 233 (*) 70 - 99 mg/dL    Comment 1 Notify RN      Comment 2 Repeat Test     PROTIME-INR     Status: Abnormal   Collection Time   07/22/12  5:30 AM      Component Value Range Comment   Prothrombin Time 25.7 (*) 11.6 - 15.2 seconds    INR 2.30 (*) 0.00 - 1.49   CBC     Status: Abnormal   Collection Time   07/22/12  5:30 AM      Component Value Range Comment   WBC 11.3 (*) 4.0 - 10.5 K/uL    RBC 3.77 (*) 4.22 - 5.81 MIL/uL    Hemoglobin 10.6 (*) 13.0 - 17.0 g/dL    HCT 16.1 (*) 09.6 - 52.0 %    MCV 86.2  78.0 - 100.0 fL    MCH 28.1  26.0 - 34.0 pg    MCHC 32.6  30.0 - 36.0 g/dL    RDW 04.5 (*) 40.9 - 15.5 %    Platelets 188  150 - 400 K/uL   GLUCOSE, CAPILLARY     Status: Abnormal   Collection Time   07/22/12  7:10 AM      Component Value Range Comment   Glucose-Capillary 119 (*) 70 - 99 mg/dL    Comment 1 Notify RN     GLUCOSE, CAPILLARY     Status: Abnormal   Collection Time   07/22/12 11:12 AM      Component Value Range Comment   Glucose-Capillary 242 (*) 70 - 99 mg/dL    Comment 1 Notify RN     GLUCOSE, CAPILLARY     Status: Abnormal   Collection Time   07/22/12  4:34 PM      Component Value Range Comment   Glucose-Capillary 224 (*) 70 - 99 mg/dL    Comment 1 Notify RN      Comment 2 Documented  in Chart     GLUCOSE, CAPILLARY     Status: Abnormal   Collection Time   07/22/12  9:42 PM      Component Value Range Comment   Glucose-Capillary 226  (*) 70 - 99 mg/dL    Comment 1 Notify RN      Comment 2 Documented in Chart     BASIC METABOLIC PANEL     Status: Abnormal   Collection Time   07/23/12  5:21 AM      Component Value Range Comment   Sodium 140  135 - 145 mEq/L    Potassium 3.4 (*) 3.5 - 5.1 mEq/L    Chloride 102  96 - 112 mEq/L    CO2 27  19 - 32 mEq/L    Glucose, Bld 187 (*) 70 - 99 mg/dL    BUN 51 (*) 6 - 23 mg/dL    Creatinine, Ser 2.95 (*) 0.50 - 1.35 mg/dL    Calcium 9.6  8.4 - 28.4 mg/dL    GFR calc non Af Amer 34 (*) >90 mL/min    GFR calc Af Amer 39 (*) >90 mL/min   PROTIME-INR     Status: Abnormal   Collection Time   07/23/12  5:21 AM      Component Value Range Comment   Prothrombin Time 26.5 (*) 11.6 - 15.2 seconds    INR 2.39 (*) 0.00 - 1.49   CBC     Status: Abnormal   Collection Time   07/23/12  5:21 AM      Component Value Range Comment   WBC 10.7 (*) 4.0 - 10.5 K/uL    RBC 3.84 (*) 4.22 - 5.81 MIL/uL    Hemoglobin 10.9 (*) 13.0 - 17.0 g/dL    HCT 13.2 (*) 44.0 - 52.0 %    MCV 85.9  78.0 - 100.0 fL    MCH 28.4  26.0 - 34.0 pg    MCHC 33.0  30.0 - 36.0 g/dL    RDW 10.2 (*) 72.5 - 15.5 %    Platelets 178  150 - 400 K/uL   GLUCOSE, CAPILLARY     Status: Abnormal   Collection Time   07/23/12  7:15 AM      Component Value Range Comment   Glucose-Capillary 163 (*) 70 - 99 mg/dL    Comment 1 Notify RN     GLUCOSE, CAPILLARY     Status: Abnormal   Collection Time   07/23/12 11:16 AM      Component Value Range Comment   Glucose-Capillary 184 (*) 70 - 99 mg/dL    Comment 1 Notify RN       Imaging: No results found.  Assessment:  1. Principal Problem: 2.  *Sepsis 3. Active Problems: 4.  Hx of CABG x 6 2003 5.  ICD (implantable cardiac defibrillator) in place 6.  Chronic a-fib 7.  Diabetes mellitus type 2, insulin dependent 8.  CKD (chronic kidney disease) stage 4, GFR 15-29 ml/min 9.  Anemia due to chronic illness 10.  Ischemic cardiomyopathy, EF 25% 2D 12/12 11.  PVD, LLE PVA 06/13/12-  for LLE PTA 7/16 12.  Diabetic foot infection, just discharged 05/30/12 13.  Acute on chronic renal failure, SCr 2.3-3.3 secondary to dehydration 14.  Ulcer of left heel 15.  Chest pain 16.  Hyponatremia 17.   Plan:  1. Hemodynamic decompensation seems to be due to sepsis and relative hypovolemia. Cardiac instability in turn related to hypotension and severe hypokalemia in the  setting of advanced ischemic cardiomyopathy. The trigger for all these problems appears to be right third toe gangrene related to severe peripheral arterial obstructive disease.  He does require daily diuretic therapy and potassium supplementation which has to be monitored carefully because of concomitant heart failure and renal failure.  Note that levofloxacin may also cause QT interval prolongation.  Currently the rhythm is normal sinus with second-degree atrioventricular block Mobitz type I and intermittent ventricular pacing.  We'll make arrangements for him to be seen back in the clinic in no more than a week. Is still in sinus rhythm at that time (which I doubt will be the case in parenthesis we will switch his ICD to DDD mode. Would also consider restarting amiodarone therapy.  I did spend quite a long time with him discussing the option of turning off tachyarrhythmia therapies of his defibrillator if he has decided to pursue palliative care only. Today however he feels much better than on admission and seems to have an intense will to live to see his granddaughter grow up. He has bounced back from numerous critical illnesses in the past and I suspect he will do so this time as well but at every subsequent event his overall functional level has deteriorated and over the last 2 years his functional capacity has worsened considerably. It is likely that within the next 12 months or so we may have to discuss elective termination of shock therapies.  Time Spent Directly with Patient:  30 minutes  Length of Stay:   LOS: 5 days    Morgaine Kimball 07/23/2012, 1:28 PM

## 2012-08-19 ENCOUNTER — Ambulatory Visit (INDEPENDENT_AMBULATORY_CARE_PROVIDER_SITE_OTHER): Payer: Medicare Other | Admitting: Urology

## 2012-08-19 DIAGNOSIS — N400 Enlarged prostate without lower urinary tract symptoms: Secondary | ICD-10-CM

## 2012-08-19 DIAGNOSIS — C61 Malignant neoplasm of prostate: Secondary | ICD-10-CM

## 2012-11-21 ENCOUNTER — Other Ambulatory Visit (HOSPITAL_COMMUNITY): Payer: Self-pay | Admitting: Cardiovascular Disease

## 2012-11-21 DIAGNOSIS — I739 Peripheral vascular disease, unspecified: Secondary | ICD-10-CM

## 2012-11-21 DIAGNOSIS — L98499 Non-pressure chronic ulcer of skin of other sites with unspecified severity: Secondary | ICD-10-CM

## 2012-11-24 ENCOUNTER — Other Ambulatory Visit (HOSPITAL_COMMUNITY): Payer: Self-pay | Admitting: Cardiovascular Disease

## 2012-11-24 ENCOUNTER — Ambulatory Visit (HOSPITAL_COMMUNITY)
Admission: RE | Admit: 2012-11-24 | Discharge: 2012-11-24 | Disposition: A | Discharge status: Home / Self Care | Payer: Medicare Other | Source: Ambulatory Visit | Attending: Cardiovascular Disease | Admitting: Cardiovascular Disease

## 2012-11-24 ENCOUNTER — Inpatient Hospital Stay (HOSPITAL_COMMUNITY): Payer: Medicare Other

## 2012-11-24 ENCOUNTER — Encounter (HOSPITAL_COMMUNITY): Payer: Medicare Other

## 2012-11-24 ENCOUNTER — Encounter (HOSPITAL_COMMUNITY): Payer: Self-pay | Admitting: Cardiology

## 2012-11-24 ENCOUNTER — Inpatient Hospital Stay (HOSPITAL_COMMUNITY)
Admission: AD | Admit: 2012-11-24 | Discharge: 2012-12-11 | DRG: 617 | Disposition: A | Discharge status: Rehabilitation Facility | Payer: Medicare Other | Source: Ambulatory Visit | Attending: Cardiovascular Disease | Admitting: Cardiovascular Disease

## 2012-11-24 DIAGNOSIS — I509 Heart failure, unspecified: Secondary | ICD-10-CM | POA: Diagnosis present

## 2012-11-24 DIAGNOSIS — I252 Old myocardial infarction: Secondary | ICD-10-CM

## 2012-11-24 DIAGNOSIS — L089 Local infection of the skin and subcutaneous tissue, unspecified: Secondary | ICD-10-CM

## 2012-11-24 DIAGNOSIS — E11621 Type 2 diabetes mellitus with foot ulcer: Secondary | ICD-10-CM | POA: Diagnosis present

## 2012-11-24 DIAGNOSIS — Z87891 Personal history of nicotine dependence: Secondary | ICD-10-CM

## 2012-11-24 DIAGNOSIS — I480 Paroxysmal atrial fibrillation: Secondary | ICD-10-CM | POA: Diagnosis present

## 2012-11-24 DIAGNOSIS — E1142 Type 2 diabetes mellitus with diabetic polyneuropathy: Secondary | ICD-10-CM | POA: Diagnosis present

## 2012-11-24 DIAGNOSIS — E785 Hyperlipidemia, unspecified: Secondary | ICD-10-CM | POA: Diagnosis present

## 2012-11-24 DIAGNOSIS — L97309 Non-pressure chronic ulcer of unspecified ankle with unspecified severity: Secondary | ICD-10-CM | POA: Diagnosis present

## 2012-11-24 DIAGNOSIS — I4891 Unspecified atrial fibrillation: Secondary | ICD-10-CM | POA: Diagnosis present

## 2012-11-24 DIAGNOSIS — Z79899 Other long term (current) drug therapy: Secondary | ICD-10-CM

## 2012-11-24 DIAGNOSIS — Z7901 Long term (current) use of anticoagulants: Secondary | ICD-10-CM

## 2012-11-24 DIAGNOSIS — I2589 Other forms of chronic ischemic heart disease: Secondary | ICD-10-CM | POA: Diagnosis present

## 2012-11-24 DIAGNOSIS — E039 Hypothyroidism, unspecified: Secondary | ICD-10-CM | POA: Diagnosis present

## 2012-11-24 DIAGNOSIS — E1169 Type 2 diabetes mellitus with other specified complication: Principal | ICD-10-CM | POA: Diagnosis present

## 2012-11-24 DIAGNOSIS — I472 Ventricular tachycardia, unspecified: Secondary | ICD-10-CM | POA: Diagnosis not present

## 2012-11-24 DIAGNOSIS — J4489 Other specified chronic obstructive pulmonary disease: Secondary | ICD-10-CM | POA: Diagnosis present

## 2012-11-24 DIAGNOSIS — D649 Anemia, unspecified: Secondary | ICD-10-CM | POA: Diagnosis present

## 2012-11-24 DIAGNOSIS — E119 Type 2 diabetes mellitus without complications: Secondary | ICD-10-CM

## 2012-11-24 DIAGNOSIS — N184 Chronic kidney disease, stage 4 (severe): Secondary | ICD-10-CM | POA: Diagnosis present

## 2012-11-24 DIAGNOSIS — R768 Other specified abnormal immunological findings in serum: Secondary | ICD-10-CM | POA: Diagnosis present

## 2012-11-24 DIAGNOSIS — I5022 Chronic systolic (congestive) heart failure: Secondary | ICD-10-CM | POA: Diagnosis present

## 2012-11-24 DIAGNOSIS — J449 Chronic obstructive pulmonary disease, unspecified: Secondary | ICD-10-CM | POA: Diagnosis present

## 2012-11-24 DIAGNOSIS — I4729 Other ventricular tachycardia: Secondary | ICD-10-CM | POA: Diagnosis present

## 2012-11-24 DIAGNOSIS — L97509 Non-pressure chronic ulcer of other part of unspecified foot with unspecified severity: Secondary | ICD-10-CM | POA: Diagnosis present

## 2012-11-24 DIAGNOSIS — Z951 Presence of aortocoronary bypass graft: Secondary | ICD-10-CM

## 2012-11-24 DIAGNOSIS — I10 Essential (primary) hypertension: Secondary | ICD-10-CM | POA: Diagnosis present

## 2012-11-24 DIAGNOSIS — M109 Gout, unspecified: Secondary | ICD-10-CM | POA: Diagnosis present

## 2012-11-24 DIAGNOSIS — D638 Anemia in other chronic diseases classified elsewhere: Secondary | ICD-10-CM | POA: Diagnosis present

## 2012-11-24 DIAGNOSIS — I129 Hypertensive chronic kidney disease with stage 1 through stage 4 chronic kidney disease, or unspecified chronic kidney disease: Secondary | ICD-10-CM | POA: Diagnosis present

## 2012-11-24 DIAGNOSIS — G4730 Sleep apnea, unspecified: Secondary | ICD-10-CM | POA: Diagnosis present

## 2012-11-24 DIAGNOSIS — L98499 Non-pressure chronic ulcer of skin of other sites with unspecified severity: Secondary | ICD-10-CM

## 2012-11-24 DIAGNOSIS — Z794 Long term (current) use of insulin: Secondary | ICD-10-CM

## 2012-11-24 DIAGNOSIS — I798 Other disorders of arteries, arterioles and capillaries in diseases classified elsewhere: Secondary | ICD-10-CM | POA: Diagnosis present

## 2012-11-24 DIAGNOSIS — I255 Ischemic cardiomyopathy: Secondary | ICD-10-CM | POA: Diagnosis present

## 2012-11-24 DIAGNOSIS — E1159 Type 2 diabetes mellitus with other circulatory complications: Secondary | ICD-10-CM | POA: Diagnosis present

## 2012-11-24 DIAGNOSIS — K219 Gastro-esophageal reflux disease without esophagitis: Secondary | ICD-10-CM | POA: Diagnosis present

## 2012-11-24 DIAGNOSIS — Z95810 Presence of automatic (implantable) cardiac defibrillator: Secondary | ICD-10-CM | POA: Diagnosis present

## 2012-11-24 DIAGNOSIS — E1149 Type 2 diabetes mellitus with other diabetic neurological complication: Secondary | ICD-10-CM | POA: Diagnosis present

## 2012-11-24 DIAGNOSIS — L8992 Pressure ulcer of unspecified site, stage 2: Secondary | ICD-10-CM | POA: Diagnosis present

## 2012-11-24 DIAGNOSIS — L89609 Pressure ulcer of unspecified heel, unspecified stage: Secondary | ICD-10-CM | POA: Diagnosis present

## 2012-11-24 DIAGNOSIS — I739 Peripheral vascular disease, unspecified: Secondary | ICD-10-CM | POA: Diagnosis present

## 2012-11-24 DIAGNOSIS — I2510 Atherosclerotic heart disease of native coronary artery without angina pectoris: Secondary | ICD-10-CM | POA: Diagnosis present

## 2012-11-24 HISTORY — DX: Other specified disorders of kidney and ureter: N28.89

## 2012-11-24 HISTORY — DX: Peripheral vascular disease, unspecified: I73.9

## 2012-11-24 HISTORY — DX: Essential (primary) hypertension: I10

## 2012-11-24 HISTORY — DX: Peripheral vascular angioplasty status: Z98.62

## 2012-11-24 HISTORY — DX: Chronic kidney disease, stage 3 (moderate): N18.3

## 2012-11-24 HISTORY — DX: Presence of aortocoronary bypass graft: Z95.1

## 2012-11-24 HISTORY — DX: Chronic kidney disease, stage 3 unspecified: N18.30

## 2012-11-24 LAB — HEMOGLOBIN A1C
Hgb A1c MFr Bld: 8.4 % — ABNORMAL HIGH (ref <5.7)
Mean Plasma Glucose: 194 mg/dL — ABNORMAL HIGH (ref <117)

## 2012-11-24 LAB — COMPREHENSIVE METABOLIC PANEL
ALT: 7 U/L (ref 0–53)
AST: 18 U/L (ref 0–37)
Albumin: 2.8 g/dL — ABNORMAL LOW (ref 3.5–5.2)
Alkaline Phosphatase: 120 U/L — ABNORMAL HIGH (ref 39–117)
BUN: 72 mg/dL — ABNORMAL HIGH (ref 6–23)
CO2: 28 mEq/L (ref 19–32)
Calcium: 9.4 mg/dL (ref 8.4–10.5)
Chloride: 96 mEq/L (ref 96–112)
Creatinine, Ser: 2.37 mg/dL — ABNORMAL HIGH (ref 0.50–1.35)
GFR calc Af Amer: 29 mL/min — ABNORMAL LOW (ref >90)
GFR calc non Af Amer: 25 mL/min — ABNORMAL LOW (ref >90)
Glucose, Bld: 191 mg/dL — ABNORMAL HIGH (ref 70–99)
Potassium: 3.1 mEq/L — ABNORMAL LOW (ref 3.5–5.1)
Sodium: 137 mEq/L (ref 135–145)
Total Bilirubin: 0.6 mg/dL (ref 0.3–1.2)
Total Protein: 6.7 g/dL (ref 6.0–8.3)

## 2012-11-24 LAB — CBC WITH DIFFERENTIAL/PLATELET
Basophils Absolute: 0 10*3/uL (ref 0.0–0.1)
Basophils Absolute: 0 10*3/uL (ref 0.0–0.1)
Basophils Relative: 0 % (ref 0–1)
Basophils Relative: 0 % (ref 0–1)
Eosinophils Absolute: 0.2 10*3/uL (ref 0.0–0.7)
Eosinophils Absolute: 0.2 10*3/uL (ref 0.0–0.7)
Eosinophils Relative: 2 % (ref 0–5)
Eosinophils Relative: 2 % (ref 0–5)
HCT: 29.5 % — ABNORMAL LOW (ref 39.0–52.0)
HCT: 30.1 % — ABNORMAL LOW (ref 39.0–52.0)
Hemoglobin: 9.7 g/dL — ABNORMAL LOW (ref 13.0–17.0)
Hemoglobin: 10 g/dL — ABNORMAL LOW (ref 13.0–17.0)
Lymphocytes Relative: 14 % (ref 12–46)
Lymphs Abs: 1.5 10*3/uL (ref 0.7–4.0)
MCH: 28 pg (ref 26.0–34.0)
MCH: 28.3 pg (ref 26.0–34.0)
MCHC: 32.9 g/dL (ref 30.0–36.0)
MCHC: 33.2 g/dL (ref 30.0–36.0)
MCV: 85 fL (ref 78.0–100.0)
MCV: 85.3 fL (ref 78.0–100.0)
Monocytes Absolute: 0.8 10*3/uL (ref 0.1–1.0)
Monocytes Absolute: 0.8 10*3/uL (ref 0.1–1.0)
Monocytes Relative: 7 % (ref 3–12)
Monocytes Relative: 8 % (ref 3–12)
Neutro Abs: 8.2 10*3/uL — ABNORMAL HIGH (ref 1.7–7.7)
Neutro Abs: 8.1 10*3/uL — ABNORMAL HIGH (ref 1.7–7.7)
Neutrophils Relative %: 77 % (ref 43–77)
Platelets: 167 10*3/uL (ref 150–400)
RBC: 3.47 MIL/uL — ABNORMAL LOW (ref 4.22–5.81)
RDW: 16.9 % — ABNORMAL HIGH (ref 11.5–15.5)
RDW: 16.7 % — ABNORMAL HIGH (ref 11.5–15.5)
WBC: 10.6 10*3/uL — ABNORMAL HIGH (ref 4.0–10.5)

## 2012-11-24 LAB — PROTIME-INR
INR: 2 — ABNORMAL HIGH (ref 0.00–1.49)
Prothrombin Time: 21.9 seconds — ABNORMAL HIGH (ref 11.6–15.2)

## 2012-11-24 LAB — GLUCOSE, CAPILLARY: Glucose-Capillary: 177 mg/dL — ABNORMAL HIGH (ref 70–99)

## 2012-11-24 LAB — MRSA PCR SCREENING: MRSA by PCR: NEGATIVE

## 2012-11-24 LAB — TSH: TSH: 1.293 u[IU]/mL (ref 0.350–4.500)

## 2012-11-24 MED ORDER — ACETAMINOPHEN 325 MG PO TABS
650.0000 mg | ORAL_TABLET | ORAL | Status: DC | PRN
  Administered 2012-12-10: 650 mg via ORAL
  Filled 2012-11-24: qty 2

## 2012-11-24 MED ORDER — HYDRALAZINE HCL 25 MG PO TABS
25.0000 mg | ORAL_TABLET | Freq: Three times a day (TID) | ORAL | Status: DC
  Administered 2012-11-24 – 2012-12-11 (×48): 25 mg via ORAL
  Filled 2012-11-24 (×54): qty 1

## 2012-11-24 MED ORDER — CARVEDILOL 25 MG PO TABS
25.0000 mg | ORAL_TABLET | Freq: Two times a day (BID) | ORAL | Status: DC
  Administered 2012-11-25 – 2012-12-11 (×31): 25 mg via ORAL
  Filled 2012-11-24 (×38): qty 1

## 2012-11-24 MED ORDER — LEVOTHYROXINE SODIUM 50 MCG PO TABS
50.0000 ug | ORAL_TABLET | Freq: Every day | ORAL | Status: DC
  Administered 2012-11-25 – 2012-12-11 (×17): 50 ug via ORAL
  Filled 2012-11-24 (×20): qty 1

## 2012-11-24 MED ORDER — OXYCODONE-ACETAMINOPHEN 5-325 MG PO TABS
1.0000 | ORAL_TABLET | ORAL | Status: DC | PRN
  Administered 2012-11-26: 1 via ORAL
  Administered 2012-11-27 – 2012-12-09 (×9): 2 via ORAL
  Filled 2012-11-24: qty 2
  Filled 2012-11-24 (×2): qty 1
  Filled 2012-11-24 (×6): qty 2
  Filled 2012-11-24: qty 1
  Filled 2012-11-24: qty 2

## 2012-11-24 MED ORDER — INSULIN ASPART 100 UNIT/ML ~~LOC~~ SOLN
0.0000 [IU] | Freq: Every day | SUBCUTANEOUS | Status: DC
  Administered 2012-11-25 – 2012-11-26 (×2): 3 [IU] via SUBCUTANEOUS
  Administered 2012-11-28 – 2012-11-30 (×2): 2 [IU] via SUBCUTANEOUS

## 2012-11-24 MED ORDER — ISOSORBIDE DINITRATE 20 MG PO TABS
20.0000 mg | ORAL_TABLET | Freq: Three times a day (TID) | ORAL | Status: DC
  Administered 2012-11-24 – 2012-12-11 (×49): 20 mg via ORAL
  Filled 2012-11-24 (×54): qty 1

## 2012-11-24 MED ORDER — FUROSEMIDE 80 MG PO TABS
80.0000 mg | ORAL_TABLET | Freq: Two times a day (BID) | ORAL | Status: DC
  Administered 2012-11-25: 80 mg via ORAL
  Filled 2012-11-24 (×3): qty 1

## 2012-11-24 MED ORDER — ZOLPIDEM TARTRATE 5 MG PO TABS: 5.0000 mg | ORAL_TABLET | Freq: Every evening | ORAL | Status: DC | PRN

## 2012-11-24 MED ORDER — POTASSIUM CHLORIDE CRYS ER 20 MEQ PO TBCR
20.0000 meq | EXTENDED_RELEASE_TABLET | Freq: Two times a day (BID) | ORAL | Status: AC
  Administered 2012-11-24 – 2012-11-25 (×3): 20 meq via ORAL
  Filled 2012-11-24 (×3): qty 1

## 2012-11-24 MED ORDER — TRAMADOL HCL 50 MG PO TABS: 50.0000 mg | ORAL_TABLET | Freq: Four times a day (QID) | ORAL | Status: DC | PRN

## 2012-11-24 MED ORDER — INSULIN ASPART 100 UNIT/ML ~~LOC~~ SOLN
0.0000 [IU] | Freq: Three times a day (TID) | SUBCUTANEOUS | Status: DC
  Administered 2012-11-24: 3 [IU] via SUBCUTANEOUS
  Administered 2012-11-25 (×2): 5 [IU] via SUBCUTANEOUS
  Administered 2012-11-25 – 2012-11-26 (×2): 11 [IU] via SUBCUTANEOUS
  Administered 2012-11-26 – 2012-11-28 (×6): 8 [IU] via SUBCUTANEOUS
  Administered 2012-11-29: 5 [IU] via SUBCUTANEOUS
  Administered 2012-11-29 – 2012-11-30 (×2): 3 [IU] via SUBCUTANEOUS
  Administered 2012-11-30: 5 [IU] via SUBCUTANEOUS
  Administered 2012-11-30: 8 [IU] via SUBCUTANEOUS
  Administered 2012-12-01: 3 [IU] via SUBCUTANEOUS
  Administered 2012-12-01: 2 [IU] via SUBCUTANEOUS
  Administered 2012-12-01: 8 [IU] via SUBCUTANEOUS
  Administered 2012-12-02 (×2): 3 [IU] via SUBCUTANEOUS
  Administered 2012-12-02: 2 [IU] via SUBCUTANEOUS
  Administered 2012-12-03: 5 [IU] via SUBCUTANEOUS
  Administered 2012-12-03 (×2): 2 [IU] via SUBCUTANEOUS
  Administered 2012-12-04 (×2): 3 [IU] via SUBCUTANEOUS
  Administered 2012-12-04: 2 [IU] via SUBCUTANEOUS
  Administered 2012-12-05 (×2): 3 [IU] via SUBCUTANEOUS
  Administered 2012-12-05: 2 [IU] via SUBCUTANEOUS
  Administered 2012-12-06: 3 [IU] via SUBCUTANEOUS
  Administered 2012-12-06: 5 [IU] via SUBCUTANEOUS
  Administered 2012-12-06 – 2012-12-10 (×6): 3 [IU] via SUBCUTANEOUS
  Administered 2012-12-10: 2 [IU] via SUBCUTANEOUS
  Administered 2012-12-10 – 2012-12-11 (×2): 3 [IU] via SUBCUTANEOUS
  Administered 2012-12-11: 12:00:00 via SUBCUTANEOUS
  Administered 2012-12-11: 3 [IU] via SUBCUTANEOUS

## 2012-11-24 MED ORDER — PANTOPRAZOLE SODIUM 40 MG PO TBEC
40.0000 mg | DELAYED_RELEASE_TABLET | Freq: Every day | ORAL | Status: DC
  Administered 2012-11-25 – 2012-12-11 (×16): 40 mg via ORAL
  Filled 2012-11-24 (×14): qty 1

## 2012-11-24 MED ORDER — ALLOPURINOL 100 MG PO TABS
100.0000 mg | ORAL_TABLET | Freq: Every day | ORAL | Status: DC
  Administered 2012-11-25 – 2012-12-11 (×17): 100 mg via ORAL
  Filled 2012-11-24 (×18): qty 1

## 2012-11-24 MED ORDER — WARFARIN - PHARMACIST DOSING INPATIENT: Freq: Every day | Status: DC

## 2012-11-24 MED ORDER — INSULIN GLARGINE 100 UNIT/ML ~~LOC~~ SOLN
8.0000 [IU] | SUBCUTANEOUS | Status: DC
  Administered 2012-11-25 – 2012-11-27 (×3): 8 [IU] via SUBCUTANEOUS
  Administered 2012-11-28: 10 [IU] via SUBCUTANEOUS

## 2012-11-24 MED ORDER — WARFARIN SODIUM 5 MG PO TABS
5.0000 mg | ORAL_TABLET | Freq: Once | ORAL | Status: AC
  Administered 2012-11-24: 5 mg via ORAL
  Filled 2012-11-24: qty 1

## 2012-11-24 MED ORDER — ONDANSETRON HCL 4 MG/2ML IJ SOLN
4.0000 mg | Freq: Four times a day (QID) | INTRAMUSCULAR | Status: DC | PRN
  Administered 2012-11-27: 21:00:00 4 mg via INTRAVENOUS
  Filled 2012-11-24: qty 2

## 2012-11-24 MED ORDER — SIMVASTATIN 40 MG PO TABS
40.0000 mg | ORAL_TABLET | Freq: Every day | ORAL | Status: DC
  Administered 2012-11-24 – 2012-12-10 (×17): 40 mg via ORAL
  Filled 2012-11-24 (×19): qty 1

## 2012-11-24 NOTE — Consult Note (Signed)
[facility]    Date of Admission:  11/24/2012  Date of Consult:  11/24/2012  Reason for Consult:Diabetic foot ulcer with likely osteomyelitis Referring Physician: Dr. Allyson Sabal   HPI: [name] is an 77 y.o. male with hx of PVD, DM, CAD sp CABG, who had a subtottally occluded popliteal with critical ischemia and ischemic left heel ulcer sp PCI atherectomy,  by Dr. Allyson Sabal in July 10th, 2013. Panel he had radically improve blood flow post operatively and had improvement in the appearance of his heel ulcer. However in August he had been found to have osteomyelitis and was placed on oral doxycycline. Following with Dr. Lajoyce Corners at that time. He probably is worsening of his wound at that time and his toes then became swollen and began draining pus. His ICD fired, he became febrile and hypotensive and he was ultimately admitted to the hospital 07/18/2012. History of broad-spectrum advice in the form of vancomycin and Zosyn and ultimately discharged to tears on oral levofloxacin. The patient has been followed since then I Dr. Allyson Sabal as well as the wound care clinic and also Dr. Lajoyce Corners. He claims to me that his wounds have worsened with the casting and local wound care that he has been receiving. It should be noted however that he has had worsening of his blood flow there is concern for possible reocclusion. In any case the ulcer on his posture reveals enlarged and become more blackened. He now also has an ulcer on the anterior of the foot which is purulent and foul-smelling. He was admitted by Dr. Gery Pray due to concerns for progression of infection in also with concern that previously opened Left popliteal has re-occluded. I was frank with the patient in informing him that I was VERY skeptical that he would be able to salvage his limb from an ID standpoint, let alone from a vascular perspective. At present, despite the severity of his ulcers, he is NOT TOXIC appearing, his afebrile and  there is NOT URGENT need for antibiotics. I would like to  Proceed to get a non contrast CT of foot and ankle to assess this site. In addition to the assesment of his blood supply via angiogram by Dr. Allyson Sabal, the patient needs to be seen by an Orthopedic surgeon. He apparently is currently upset with his surgeon Dr. Lajoyce Corners and refuses to be seen by him. I have suggested a few other surgeons as alternatives given his being upset at this moment. He is certainly not enthusiastic about possibiliyt of BKA or other aggressive surgery but I am afraid this will be necessary. I spent greater than 60 minutes with the patient including greater than 50% of time in face to face counsel of the patient and in coordination of their care.    Past Medical History  Diagnosis Date  . HTN (hypertension)   . PVD (peripheral vascular disease)   . COPD (chronic obstructive pulmonary disease)   . Diabetes mellitus     IDDM  . S/P CABG x 6 2003    Dr Cornelius Moras, Myoview low risk 2009  . Gout   . Paroxysmal atrial fibrillation   . Hypothyroidism   . ICD (implantable cardiac defibrillator) discharge 5/11    MDT  . Chronic renal insufficiency, stage III (moderate)   . Chronic systolic CHF (congestive heart failure)     EF 25-30% echo 12/12    Past Surgical History  Procedure Date  . Coronary artery bypass graft   .  Cardiac catheterization   ergies:   No Known Allergies   Medications: I have reviewed patients current medications as documented in Epic Anti-infectives    None      Social History:  reports that he quit smoking about 9 years ago. He has quit using smokeless tobacco. He reports that he does not drink alcohol or use illicit drugs.  No family history on file.  As in HPI and primary teams notes otherwise 12 point review of systems is negative  Blood pressure 138/78, pulse 75, temperature 98.3 F (36.8 C), temperature source Oral, resp. rate 18, height 6\' 2"  (1.88 m), weight 211 lb 8 oz (95.936 kg),  SpO2 99.00%. General: Alert and awake, oriented x3, not in any acute distress. HEENT: anicteric sclera, pupils reactive to light and accommodation, EOMI, oropharynx clear and without exudate CVS irr rate, normal r,   Chest: clear to auscultation bilaterally, no wheezing, rales or rhonchi Abdomen: soft nontender, nondistended, normal bowel sounds, Extremities: LEFT FOOT: Blackened ulcer on heel with loss of tissue. Anteriorly there is wet malodorous foul-smelling purulent material material.  Neuro: nonfocal, strength and sensation intact   Results for orders placed during the hospital encounter of 11/24/12 (from the past 48 hour(s))  GLUCOSE, CAPILLARY     Status: Abnormal   Collection Time   11/24/12  4:07 PM      Component Value Range Comment   Glucose-Capillary 191 (*) 70 - 99 mg/dL    Comment 1 Notify RN      Comment 2 Documented in Chart     CBC WITH DIFFERENTIAL     Status: Abnormal   Collection Time   11/24/12  4:23 PM      Component Value Range Comment   WBC 10.6 (*) 4.0 - 10.5 K/uL    RBC 3.47 (*) 4.22 - 5.81 MIL/uL    Hemoglobin 9.7 (*) 13.0 - 17.0 g/dL    HCT 44.0 (*) 10.2 - 52.0 %    MCV 85.0  78.0 - 100.0 fL    MCH 28.0  26.0 - 34.0 pg    MCHC 32.9  30.0 - 36.0 g/dL    RDW 72.5 (*) 36.6 - 15.5 %    Platelets 167  150 - 400 K/uL    Neutrophils Relative 77  43 - 77 %    Neutro Abs 8.2 (*) 1.7 - 7.7 K/uL    Lymphocytes Relative 14  12 - 46 %    Lymphs Abs 1.5  0.7 - 4.0 K/uL    Monocytes Relative 7  3 - 12 %    Monocytes Absolute 0.8  0.1 - 1.0 K/uL    Eosinophils Relative 2  0 - 5 %    Eosinophils Absolute 0.2  0.0 - 0.7 K/uL    Basophils Relative 0  0 - 1 %    Basophils Absolute 0.0  0.0 - 0.1 K/uL   PROTIME-INR     Status: Abnormal   Collection Time   11/24/12  4:23 PM      Component Value Range Comment   Prothrombin Time 21.9 (*) 11.6 - 15.2 seconds    INR 2.00 (*) 0.00 - 1.49       Component Value Date/Time   SDES BLOOD RIGHT HAND 07/18/2012 1922    SPECREQUEST BOTTLES DRAWN AEROBIC AND ANAEROBIC 7CC 07/18/2012 1922   CULT NO GROWTH 5 DAYS 07/18/2012 1922   REPTSTATUS 07/23/2012 FINAL 07/18/2012 1922   No results found.   No results found for this  or any previous visit (from the past 720 hour(s)).   Impression/Recommendation  77 year old man with complex medical history including diabetes coronary disease peripheral vascular disease and diabetes with previously occluded popliteal artery now with worsening of a prior heel ulcer with blackening and concern for worsening ischemia and now also anterior ulceration with foul-smelling purulent material seen on the top of his foot. He undoubtedly has osteomyelitis highly skeptical of his ability to salvage his foot  #1 Diabetic foot ulcers with possibly occulded popliteal, and likely osteomyelitis: --tere is NO  URGENT need for antibiotics at this time., and empiric antibiotics will only cloud the picture at this point --pt needs an orthopedic consult and I think he will have to undergo BKA or AKA depending on his vascular supply. Even in the very unlikely ability that this infection can be salvaged by less aggressive surgery he would need orthoopedic debridement of tissue with bone cultures sent for bacterial, afb and fungal cultures to help guide protracted IV antibiotics, followed by lifelong oral antibiotics. Such an approach will very likely fail and I find the recent admissions with apparent sepsis with firing of his ICD etc to be instructive on the risk to his life that this infection may pose if not cured surgically.  --we will proceed with CT noncontrast for now --agree with wound consult --will check esr, crp  #2 IP; would place on contact precautions.    Thank you so much for this interesting consult  [facility] [phone] (pager) [phone] (office) 11/24/2012, 5:07 PM  Paulette Blanch Dam 11/24/2012, 5:07 PM

## 2012-11-24 NOTE — H&P (Signed)
Patient ID: [name] MRN: [id number], DOB/AGE: [birth year]   Admit date: 11/24/2012   Primary Physician: Colette Ribas, MD Primary Cardiologist: Dr Allyson Sabal  HPI:   77 y/o diabetic vasculopath admitted now with an infected ulcer on the dorsal aspect of his Lt foot. This has been there for at least a month. Dr Allyson Sabal saw him in the office 11/19/12 and set him up for dopplers. Dr Allyson Sabal has done prior angiograms, and most recently a HSRA to the Lt popliteal artery in July for a heel ulcer that subsequently improved. He has alos been followed by Dr Lajoyce Corners and the wound care clinic. Today he came to the off for his doppler study and Dr Allyson Sabal feels that the previously opened Lt popliteal artery is now occluded. The pt's foot appears infected. He is admitted now for ID consult and PV angiogram which is scheduled for 12/24.    Problem List: Past Medical History  Diagnosis Date  . Shortness of breath   . Anemia   . Chronic kidney disease     renal insufficiency  . CHF (congestive heart failure)   . Dysrhythmia     atrial fibrilation  . Myocardial infarction   . COPD (chronic obstructive pulmonary disease)   . Diabetes mellitus   . Coronary artery disease   . Gout 05/28/2012  . Paroxysmal atrial fibrillation 11/05/2011    permanent   . Hypothyroidism 05/28/2012  . ICD (implantable cardiac defibrillator) discharge 06/11/2012  . Acute on chronic renal failure 06/11/2012  . ICD (implantable cardiac defibrillator) in place   . Chronic systolic CHF (congestive heart failure) 06/11/2012    Past Surgical History  Procedure Date  . Coronary artery bypass graft   . Cardiac catheterization      Allergies: No Known Allergies   Home Medications Prescriptions prior to admission  Medication Sig Dispense Refill  . allopurinol (ZYLOPRIM) 100 MG tablet Take 1 tablet (100 mg total) by mouth daily.  30 tablet  5  . carvedilol (COREG) 25 MG tablet Take 1 tablet (25 mg total) by mouth 2 (two) times  daily with a meal.  60 tablet  5  . furosemide (LASIX) 80 MG tablet Take 80 mg by mouth daily.       . hydrALAZINE (APRESOLINE) 25 MG tablet Take 25 mg by mouth 3 (three) times daily.      . insulin aspart (NOVOLOG) 100 UNIT/ML injection Inject 10-15 Units into the skin 3 (three) times daily before meals. Per sliding scale      . insulin glargine (LANTUS) 100 UNIT/ML injection Inject 10 Units into the skin 2 (two) times daily.      . isosorbide dinitrate (ISORDIL) 20 MG tablet Take 20 mg by mouth 3 (three) times daily.      Marland Kitchen levothyroxine (SYNTHROID, LEVOTHROID) 50 MCG tablet Take 50 mcg by mouth every morning.       . metolazone (ZAROXOLYN) 2.5 MG tablet Take 2.5 mg by mouth daily. States he takes with his furosemide every day except on Tuesday and Friday      . omeprazole (PRILOSEC) 20 MG capsule Take 40 mg by mouth every morning.       . potassium chloride SA (K-DUR,KLOR-CON) 20 MEQ tablet Take 20 mEq by mouth 2 (two) times daily.      . simvastatin (ZOCOR) 40 MG tablet Take 40 mg by mouth at bedtime.       Marland Kitchen warfarin (COUMADIN) 5 MG tablet Take 2.5-5 mg by mouth every  evening. Takes one tablet (5mg ) every day except on Tuesdays and Fridays, take one-half tablet (2.5mg )         No family history on file.   History   Social History  . Marital Status: Divorced    Spouse Name: N/A    Number of Children: N/A  . Years of Education: N/A   Occupational History  . Not on file.   Social History Main Topics  . Smoking status: Former Smoker    Quit date: 11/05/2003  . Smokeless tobacco: Former Neurosurgeon  . Alcohol Use: No  . Drug Use: No  . Sexually Active: No   Other Topics Concern  . Not on file   Social History Narrative  . No narrative on file     Review of Systems: He denies fever or chills. No unusual SOB. No angina.  Physical Exam: Blood pressure 138/78, pulse 75, temperature 98.3 F (36.8 C), temperature source Oral, resp. rate 18, height 6\' 2"  (1.88 m), weight 95.936 kg  (211 lb 8 oz), SpO2 99.00%.  General appearance: alert, cooperative and no distress Neck: no JVD and supple, symmetrical, trachea midline Lungs: decreased breath sounds Heart: regular rate and rhythm Abdomen: obese Extremities: Chronic venous changes in both LE. The Lt LE has a heel blister and an area of necrosis. The dorsal aspect of his Lt foot has an ope foul smelling ulcer. Pulses: diminnished Skin: cool and dry Neurologic: Grossly normal    Labs:  No results found for this or any previous visit (from the past 24 hour(s)).   Radiology/Studies: No results found.  [id number] shows NSR with 1st degree AVB  ASSESSMENT AND PLAN:  Principal Problem:  *Diabetic foot infection on Lt Active Problems:  CKD (chronic kidney disease) stage 4, GFR 15-29 ml/min  PVD, Lt popliteal HSRA 7/13  Chronic anticoagulation  Chronic systolic CHF (congestive heart failure)  Hx of CABG x 6 2003  ICD in place, MDT May 2011  PAF (paroxysmal atrial fibrillation)  Diabetes mellitus type 2, insulin dependent  Positive Coombs test  Anemia due to chronic illness  HTN (hypertension)  Dyslipidemia  Sleep apnea, C-Pap intol  Gout  Hypothyroidism  Plan- ID consult. PV angiogram in am. Wound care consult.   Deland Pretty, PA-C 11/24/2012, 4:05 PM

## 2012-11-24 NOTE — Progress Notes (Signed)
ANTICOAGULATION CONSULT NOTE - Initial Consult  Pharmacy Consult for warfarin Indication: atrial fibrillation  No Known Allergies  Patient Measurements: Height: 6\' 2"  (188 cm) Weight: 211 lb 8 oz (95.936 kg) IBW/kg (Calculated) : 82.2   Vital Signs: Temp: 98.3 F (36.8 C) (12/23 1522) Temp src: Oral (12/23 1522) BP: 138/78 mmHg (12/23 1522) Pulse Rate: 75  (12/23 1522)  Labs:  Basename 11/24/12 1758 11/24/12 1623  HGB 10.0* 9.7*  HCT 30.1* 29.5*  PLT 163 167  APTT -- --  LABPROT -- 21.9*  INR -- 2.00*  HEPARINUNFRC -- --  CREATININE -- 2.37*  CKTOTAL -- --  CKMB -- --  TROPONINI -- --    Estimated Creatinine Clearance: 31.3 ml/min (by C-G formula based on Cr of 2.37).   Medical History: Past Medical History  Diagnosis Date  . HTN (hypertension)   . PVD (peripheral vascular disease)   . COPD (chronic obstructive pulmonary disease)   . Diabetes mellitus     IDDM  . S/P CABG x 6 2003    Dr Cornelius Moras, Myoview low risk 2009  . Gout   . Paroxysmal atrial fibrillation   . Hypothyroidism   . ICD (implantable cardiac defibrillator) discharge 5/11    MDT  . Chronic renal insufficiency, stage III (moderate)   . Chronic systolic CHF (congestive heart failure)     EF 25-30% echo 12/12    Medications:  Prescriptions prior to admission  Medication Sig Dispense Refill  . allopurinol (ZYLOPRIM) 100 MG tablet Take 1 tablet (100 mg total) by mouth daily.  30 tablet  5  . carvedilol (COREG) 25 MG tablet Take 1 tablet (25 mg total) by mouth 2 (two) times daily with a meal.  60 tablet  5  . furosemide (LASIX) 80 MG tablet Take 80 mg by mouth daily.       . hydrALAZINE (APRESOLINE) 25 MG tablet Take 25 mg by mouth 3 (three) times daily.      . insulin aspart (NOVOLOG) 100 UNIT/ML injection Inject 10-15 Units into the skin 3 (three) times daily before meals. Per sliding scale      . insulin glargine (LANTUS) 100 UNIT/ML injection Inject 10 Units into the skin 2 (two) times daily.       . isosorbide dinitrate (ISORDIL) 20 MG tablet Take 20 mg by mouth 3 (three) times daily.      Marland Kitchen levothyroxine (SYNTHROID, LEVOTHROID) 50 MCG tablet Take 50 mcg by mouth every morning.       . metolazone (ZAROXOLYN) 2.5 MG tablet Take 2.5 mg by mouth daily. States he takes with his furosemide every day except on Tuesday and Friday      . omeprazole (PRILOSEC) 20 MG capsule Take 40 mg by mouth every morning.       . potassium chloride SA (K-DUR,KLOR-CON) 20 MEQ tablet Take 20 mEq by mouth 2 (two) times daily.      . simvastatin (ZOCOR) 40 MG tablet Take 40 mg by mouth at bedtime.       Marland Kitchen warfarin (COUMADIN) 5 MG tablet Take 2.5-5 mg by mouth every evening. Takes one tablet (5mg ) every day except on Tuesdays and Fridays, take one-half tablet (2.5mg )        Assessment: 77 yom admitted with an infected foot ulcer. He is on chronic anticoagulation for afib. His INR today is therapeutic at 2. He is slightly anemic and plts are WNL. No bleeding noted.   Goal of Therapy:  INR 2-3   Plan:  1. Coumadin 5mg  PO x  1 tonight (his normal home dose) 2. Daily INR   Lourdes Manning, Drake Leach 11/24/2012,6:49 PM

## 2012-11-25 ENCOUNTER — Encounter (HOSPITAL_COMMUNITY): Payer: Self-pay | Admitting: Cardiology

## 2012-11-25 ENCOUNTER — Ambulatory Visit (HOSPITAL_COMMUNITY): Admission: RE | Admit: 2012-11-25 | Payer: Medicare Other | Source: Ambulatory Visit | Admitting: Cardiovascular Disease

## 2012-11-25 DIAGNOSIS — L97409 Non-pressure chronic ulcer of unspecified heel and midfoot with unspecified severity: Secondary | ICD-10-CM

## 2012-11-25 DIAGNOSIS — E1169 Type 2 diabetes mellitus with other specified complication: Secondary | ICD-10-CM

## 2012-11-25 LAB — HIV ANTIBODY (ROUTINE TESTING W REFLEX): HIV: NONREACTIVE

## 2012-11-25 LAB — GLUCOSE, CAPILLARY
Glucose-Capillary: 224 mg/dL — ABNORMAL HIGH (ref 70–99)
Glucose-Capillary: 309 mg/dL — ABNORMAL HIGH (ref 70–99)
Glucose-Capillary: 264 mg/dL — ABNORMAL HIGH (ref 70–99)

## 2012-11-25 LAB — HEPARIN LEVEL (UNFRACTIONATED): Heparin Unfractionated: 0.23 IU/mL — ABNORMAL LOW (ref 0.30–0.70)

## 2012-11-25 LAB — COMPREHENSIVE METABOLIC PANEL
AST: 17 U/L (ref 0–37)
BUN: 76 mg/dL — ABNORMAL HIGH (ref 6–23)
CO2: 29 mEq/L (ref 19–32)
Calcium: 9.3 mg/dL (ref 8.4–10.5)
Creatinine, Ser: 2.33 mg/dL — ABNORMAL HIGH (ref 0.50–1.35)
GFR calc Af Amer: 30 mL/min — ABNORMAL LOW (ref >90)
GFR calc non Af Amer: 26 mL/min — ABNORMAL LOW (ref >90)

## 2012-11-25 MED ORDER — WARFARIN SODIUM 5 MG PO TABS
5.0000 mg | ORAL_TABLET | Freq: Once | ORAL | Status: DC
  Filled 2012-11-25: qty 1

## 2012-11-25 MED ORDER — SODIUM CHLORIDE 0.45 % IV SOLN
INTRAVENOUS | Status: DC
  Administered 2012-11-25 – 2012-11-27 (×3): via INTRAVENOUS

## 2012-11-25 MED ORDER — HEPARIN (PORCINE) IN NACL 100-0.45 UNIT/ML-% IJ SOLN
1450.0000 [IU]/h | INTRAMUSCULAR | Status: DC
  Administered 2012-11-25: 1300 [IU]/h via INTRAVENOUS
  Administered 2012-11-26: 1450 [IU]/h via INTRAVENOUS
  Filled 2012-11-25 (×8): qty 250

## 2012-11-25 NOTE — Consult Note (Addendum)
WOC consult Note Reason for Consult: Consult requested for left foot wound.  Pt has been followed in the past by Dr Lajoyce Corners of ortho and the outpatient wound care center.  Wound has continued to decline. CT results and arteriogram pending. Wound type: Left anterior foot full thickness   Pressure Ulcer POA: Yes; to left heel 4X4cm dark purple deep tissue injury  Measurement: Left anterior foot 7X7cm Wound bed:100% yellow slough, exposed tendon involved. Drainage (amount, consistency, odor) Large amt yellow drainage, foul odor. Periwound: Generalized edema and erythremia to left foot. Dressing procedure/placement/frequency: Moist gauze dressing until further input from ortho or vascular service.  Wound is beyond [facility] scope of practice R/T extensive amt nonviable tissue and tendon involvement. Topical care would be minimally effective, pt will probably require surgical debridement.  Prevalon boot to redistribute pressure to left leg.  Will not plan to follow further unless re-consulted.  [address], RN, MSN, [employer]  [phone]

## 2012-11-25 NOTE — Progress Notes (Signed)
ANTICOAGULATION CONSULT NOTE   Pharmacy Consult for heparin Indication: atrial fibrillation  No Known Allergies  Patient Measurements: Height: 6\' 2"  (188 cm) Weight: 211 lb 8 oz (95.936 kg) IBW/kg (Calculated) : 82.2   Vital Signs: Temp: 98 F (36.7 C) (12/24 0811) Temp src: Oral (12/24 0811) BP: 137/62 mmHg (12/24 0811) Pulse Rate: 68  (12/24 0811)  Labs:  Basename 11/25/12 0550 11/24/12 1758 11/24/12 1623  HGB -- 10.0* 9.7*  HCT -- 30.1* 29.5*  PLT -- 163 167  APTT -- -- --  LABPROT 21.0* -- 21.9*  INR 1.89* -- 2.00*  HEPARINUNFRC -- -- --  CREATININE 2.33* -- 2.37*  CKTOTAL -- -- --  CKMB -- -- --  TROPONINI -- -- --    Estimated Creatinine Clearance: 31.8 ml/min (by C-G formula based on Cr of 2.33).   Medical History: Past Medical History  Diagnosis Date  . HTN (hypertension)   . PVD (peripheral vascular disease)   . COPD (chronic obstructive pulmonary disease)   . Diabetes mellitus     IDDM  . S/P CABG x 6 2003    Dr Cornelius Moras, Myoview low risk 2009  . Gout   . Paroxysmal atrial fibrillation   . Hypothyroidism   . ICD (implantable cardiac defibrillator) discharge 5/11    MDT  . Chronic renal insufficiency, stage III (moderate)   . Chronic systolic CHF (congestive heart failure)     EF 25-30% echo 12/12    Medications:  Prescriptions prior to admission  Medication Sig Dispense Refill  . allopurinol (ZYLOPRIM) 100 MG tablet Take 1 tablet (100 mg total) by mouth daily.  30 tablet  5  . carvedilol (COREG) 25 MG tablet Take 1 tablet (25 mg total) by mouth 2 (two) times daily with a meal.  60 tablet  5  . furosemide (LASIX) 80 MG tablet Take 80 mg by mouth daily.       . hydrALAZINE (APRESOLINE) 25 MG tablet Take 25 mg by mouth 3 (three) times daily.      . insulin aspart (NOVOLOG) 100 UNIT/ML injection Inject 10-15 Units into the skin 3 (three) times daily before meals. Per sliding scale      . insulin glargine (LANTUS) 100 UNIT/ML injection Inject 10  Units into the skin 2 (two) times daily.      . isosorbide dinitrate (ISORDIL) 20 MG tablet Take 20 mg by mouth 3 (three) times daily.      Marland Kitchen levothyroxine (SYNTHROID, LEVOTHROID) 50 MCG tablet Take 50 mcg by mouth every morning.       . metolazone (ZAROXOLYN) 2.5 MG tablet Take 2.5 mg by mouth daily. States he takes with his furosemide every day except on Tuesday and Friday      . omeprazole (PRILOSEC) 20 MG capsule Take 40 mg by mouth every morning.       . potassium chloride SA (K-DUR,KLOR-CON) 20 MEQ tablet Take 20 mEq by mouth 2 (two) times daily.      . simvastatin (ZOCOR) 40 MG tablet Take 40 mg by mouth at bedtime.       Marland Kitchen warfarin (COUMADIN) 5 MG tablet Take 2.5-5 mg by mouth every evening. Takes one tablet (5mg ) every day except on Tuesdays and Fridays, take one-half tablet (2.5mg )        Assessment: 77 yom admitted with an infected foot ulcer. He is on chronic anticoagulation for afib. His INR today is 1.89 Plan to hold coumadin and start heparin as he needs angiogram.  Goal  of Therapy:  Heparin level 0.3-0.7 units/ml   Plan:  1. Start heparin drip at 1300 units/hr 2. Check heparin level 8 hours after start. 3.  Daily HL and CBC while on hep[arin. 4. Monitor for s&s bleeding.  Farrah Skoda Poteet 11/25/2012,10:27 AM

## 2012-11-25 NOTE — Care Management Note (Addendum)
    Page 1 of 1   12/05/2012     5:35:29 PM   CARE MANAGEMENT NOTE 12/05/2012  Patient:  [name], [name]   Account Number:  [id number]  Date Initiated:  11/25/2012  Documentation initiated by:  Junius Creamer  Subjective/Objective Assessment:   adm w foot ulcer     Action/Plan:   lives alone but da lives next door   Anticipated DC Date:  12/12/2012   Anticipated DC Plan:  IP REHAB FACILITY  In-house referral  Clinical Social Worker      DC Planning Services  CM consult      Choice offered to / List presented to:             Status of service:   Medicare Important Message given?   (If response is "NO", the following Medicare IM given date fields will be blank) Date Medicare IM given:   Date Additional Medicare IM given:    Discharge Disposition:    Per UR Regulation:  Reviewed for med. necessity/level of care/duration of stay  If discussed at Long Length of Stay Meetings, dates discussed:   12/02/2012    Comments:  12/05/12 Caitlyne Ingham,RN,BSN [phone] LT BKA PLANNED FOR MONDAY 12/09/11. WILL CONSULT CSW FOR POSSIBLE SNF AS BACKUP PLAN IF IP REHAB FALLS THROUGH. WILL FOLLOW.   12/24 1039a debbie dowell rn,bsn spoke w pt, da lives next door. hx of [facility] in [place] but was not pleased w [facility]. he does not want [facility] again. will follow.

## 2012-11-25 NOTE — Progress Notes (Signed)
[facility]    Subjective: No new complaints   Antibiotics:  Anti-infectives    None      Medications: Scheduled Meds:   . allopurinol  100 mg Oral Daily  . carvedilol  25 mg Oral BID WC  . hydrALAZINE  25 mg Oral TID  . insulin aspart  0-15 Units Subcutaneous TID WC  . insulin aspart  0-5 Units Subcutaneous QHS  . insulin glargine  8 Units Subcutaneous [id number]  . isosorbide dinitrate  20 mg Oral TID  . levothyroxine  50 mcg Oral QAC breakfast  . pantoprazole  40 mg Oral Daily  . potassium chloride SA  20 mEq Oral BID  . simvastatin  40 mg Oral QHS   Continuous Infusions:   . sodium chloride 50 mL/hr at 11/25/12 1037  . heparin     PRN Meds:.acetaminophen, ondansetron (ZOFRAN) IV, oxyCODONE-acetaminophen, traMADol, zolpidem   Objective: Weight change:   Intake/Output Summary (Last 24 hours) at 11/25/12 1120 Last data filed at 11/25/12 0500  Gross per 24 hour  Intake      0 ml  Output   1100 ml  Net  -1100 ml   Blood pressure 143/84, pulse 68, temperature 98 F (36.7 C), temperature source Oral, resp. rate 18, height 6\' 2"  (1.88 m), weight 211 lb 8 oz (95.936 kg), SpO2 95.00%. Temp:  [98 F (36.7 C)-99 F (37.2 C)] 98 F (36.7 C) (12/24 0811) Pulse Rate:  [68-77] 68  (12/24 0811) Resp:  [18-20] 18  (12/24 0811) BP: (137-158)/(62-84) 143/84 mmHg (12/24 1033) SpO2:  [95 %-99 %] 95 % (12/24 0811) Weight:  [211 lb 8 oz (95.936 kg)] 211 lb 8 oz (95.936 kg) (12/23 1522)  Physical Exam: General: Alert and awake, oriented x3, not in any acute distress.  HEENT: anicteric sclera, pupils reactive to light and accommodation, EOMI, oropharynx clear and without exudate  CVS irr rate, normal r,  Chest: clear to auscultation bilaterally, no wheezing, rales or rhonchi  Abdomen: soft nontender, nondistended, normal bowel sounds,  Extremities:  LEFT FOOT: bandaged  Lab Results:  Basename 11/24/12 1758 11/24/12 1623  WBC 10.7* 10.6*    HGB 10.0* 9.7*  HCT 30.1* 29.5*  PLT 163 167    BMET  Basename 11/25/12 0550 11/24/12 1623  NA 136 137  K 3.3* 3.1*  CL 94* 96  CO2 29 28  GLUCOSE 227* 191*  BUN 76* 72*  CREATININE 2.33* 2.37*  CALCIUM 9.3 9.4    Micro Results: Recent Results (from the past 240 hour(s))  MRSA PCR SCREENING     Status: Normal   Collection Time   11/24/12  3:43 PM      Component Value Range Status Comment   MRSA by PCR NEGATIVE  NEGATIVE Final   WOUND CULTURE     Status: Normal (Preliminary result)   Collection Time   11/24/12  3:44 PM      Component Value Range Status Comment   Specimen Description WOUND FOOT   Final    Special Requests Normal   Final    Gram Stain     Final    Value: RARE WBC PRESENT, PREDOMINANTLY PMN     NO SQUAMOUS EPITHELIAL CELLS SEEN     RARE GRAM POSITIVE COCCI     IN PAIRS RARE GRAM VARIABLE ROD   Culture Culture reincubated for better growth   Final    Report Status PENDING   Incomplete     Studies/Results: Dg  Chest 2 View  11/25/2012  *RADIOLOGY REPORT*  Clinical Data: CHF, shortness of breath  CHEST - 2 VIEW  Comparison: 07/18/2012  Findings: Increased interstitial markings.  No frank interstitial edema. No pleural effusion or pneumothorax.  Stable cardiomegaly. Postsurgical changes related to prior CABG. Left subclavian ICD.  Degenerative changes of the visualized thoracolumbar spine.  IMPRESSION: No evidence of acute cardiopulmonary disease.   Original Report Authenticated By: Charline Bills, M.D.    Ct Ankle Left Wo Contrast  11/25/2012  *RADIOLOGY REPORT*  Clinical Data: Left ankle redness and swelling.  CT OF THE LEFT ANKLE WITHOUT CONTRAST  Technique:  Multidetector CT imaging was performed according to the standard protocol. Multiplanar CT image reconstructions were also generated.  Comparison: CT scan left foot 05/29/2012.  Findings: Diffuse and marked subcutaneous edema is present.  Small locules of gas are seen in the cutaneous soft tissues  anterior to the tibiotalar joint are compatible with a skin ulceration or laceration.  No focal fluid collection or deep soft tissue gas collection is identified.  Innumerable erosions are present about the ankle and foot with scattered areas of calcification consistent with multi focal gouty arthritis.  The appearance is not markedly changed.  There is no fracture or dislocation.  IMPRESSION:  1.  Diffuse and intense subcutaneous edema without focal fluid collection.  Small cutaneous gas locules anterior to the tibiotalar joint compatible with a skin ulceration or laceration. 2.  Changes of severe multifocal  gouty arthritis.  The appearance of the visualized bones is unchanged.  No finding to suggest osteomyelitis is identified.   Original Report Authenticated By: Holley Dexter, M.D.       Assessment/Plan: [name] is a 77 y.o. male with   with complex medical history including diabetes coronary disease peripheral vascular disease and diabetes with previously occluded popliteal artery now with worsening of a prior heel ulcer with blackening and concern for worsening ischemia and now also anterior ulceration with foul-smelling purulent material seen on the top of his foot. ESR is 84. CT does not show frankl osteomyelitis but I nonetheless remain highly skeptical of his ability to salvage his foot.    #1 Diabetic foot ulcers with possibly occulded popliteal, and likely osteomyelitis:   --there is NO URGENT need for antibiotics at this time., and empiric antibiotics will only cloud the picture at this point   --pt needs an orthopedic consult and I think he will have to undergo BKA or AKA depending on his vascular supply. Even in the very unlikely ability that this infection can be salvaged by less aggressive surgery he would need orthoopedic debridement of tissue with bone cultures sent for bacterial, afb and fungal cultures to help guide protracted IV antibiotics, followed by lifelong oral  antibiotics. Such an approach will very likely fail and I find the recent admissions with apparent sepsis with firing of his ICD etc to be instructive on the risk to his life that this infection may pose if not cured surgically.   --we will await results of angiogram (delayed today) and orthopedic consultation  #2 IP; would place on contact precautions.   Dr. Ninetta Lights will be covering for the next 4 days and is available for questions on those days.      LOS: 1 day   Acey Lav 11/25/2012, 11:20 AM

## 2012-11-25 NOTE — Progress Notes (Signed)
Subjective:  No SOB  Objective:  Vital Signs in the last 24 hours: Temp:  [98 F (36.7 C)-99 F (37.2 C)] 98 F (36.7 C) (12/24 0811) Pulse Rate:  [68-77] 68  (12/24 0811) Resp:  [18-20] 18  (12/24 0811) BP: (137-158)/(62-78) 137/62 mmHg (12/24 0811) SpO2:  [95 %-99 %] 95 % (12/24 0811) Weight:  [95.936 kg (211 lb 8 oz)] 95.936 kg (211 lb 8 oz) (12/23 1522)  Intake/Output from previous day:  Intake/Output Summary (Last 24 hours) at 11/25/12 0929 Last data filed at 11/25/12 0500  Gross per 24 hour  Intake      0 ml  Output   1100 ml  Net  -1100 ml    Physical Exam: General appearance: alert, cooperative and no distress Lungs: clear to auscultation bilaterally Heart: regular rate and rhythm   Rate: 65  Rhythm: normal sinus rhythm and long 1st degree AVB  Lab Results:  Basename 11/24/12 1758 11/24/12 1623  WBC 10.7* 10.6*  HGB 10.0* 9.7*  PLT 163 167    Basename 11/25/12 0550 11/24/12 1623  NA 136 137  K 3.3* 3.1*  CL 94* 96  CO2 29 28  GLUCOSE 227* 191*  BUN 76* 72*  CREATININE 2.33* 2.37*   No results found for this basename: TROPONINI:2,CK,MB:2 in the last 72 hours Hepatic Function Panel  Basename 11/25/12 0550  PROT 6.7  ALBUMIN 2.8*  AST 17  ALT 7  ALKPHOS 117  BILITOT 0.7  BILIDIR --  IBILI --   No results found for this basename: CHOL in the last 72 hours  Basename 11/25/12 0550  INR 1.89*    Imaging: Imaging results have been reviewed Lt ankle/foot CT results pending  Cardiac Studies:  Assessment/Plan:   Principal Problem:  *Diabetic foot ulcer Active Problems:  CKD (chronic kidney disease) stage 4, GFR 15-29 ml/min  PVD, Lt popliteal HSRA 7/13  Chronic anticoagulation  Chronic systolic CHF (congestive heart failure)  Hx of CABG x 6 2003  ICD in place, MDT May 2011  PAF (paroxysmal atrial fibrillation)  Diabetes mellitus type 2, insulin dependent  Positive Coombs test  Anemia due to chronic illness  HTN (hypertension)  Dyslipidemia  Sleep apnea, C-Pap intol  Gout  Hypothyroidism   Plan- Unable to angiogram today secondary to high INR. Discussed with Dr Allyson Sabal- will gently hydrate- hold Coumadin- start Heparin- angiogram Thursday. Appreciate Dr Zenaida Niece Dam's input. No antibiotics at this time.   Corine Shelter PA-C 11/25/2012, 9:29 AM

## 2012-11-25 NOTE — Progress Notes (Signed)
ANTICOAGULATION CONSULT NOTE   Pharmacy Consult for warfarin Indication: atrial fibrillation  No Known Allergies  Patient Measurements: Height: 6\' 2"  (188 cm) Weight: 211 lb 8 oz (95.936 kg) IBW/kg (Calculated) : 82.2   Vital Signs: Temp: 98 F (36.7 C) (12/24 0811) Temp src: Oral (12/24 0811) BP: 137/62 mmHg (12/24 0811) Pulse Rate: 68  (12/24 0811)  Labs:  Basename 11/25/12 0550 11/24/12 1758 11/24/12 1623  HGB -- 10.0* 9.7*  HCT -- 30.1* 29.5*  PLT -- 163 167  APTT -- -- --  LABPROT 21.0* -- 21.9*  INR 1.89* -- 2.00*  HEPARINUNFRC -- -- --  CREATININE 2.33* -- 2.37*  CKTOTAL -- -- --  CKMB -- -- --  TROPONINI -- -- --    Estimated Creatinine Clearance: 31.8 ml/min (by C-G formula based on Cr of 2.33).   Medical History: Past Medical History  Diagnosis Date  . HTN (hypertension)   . PVD (peripheral vascular disease)   . COPD (chronic obstructive pulmonary disease)   . Diabetes mellitus     IDDM  . S/P CABG x 6 2003    Dr Cornelius Moras, Myoview low risk 2009  . Gout   . Paroxysmal atrial fibrillation   . Hypothyroidism   . ICD (implantable cardiac defibrillator) discharge 5/11    MDT  . Chronic renal insufficiency, stage III (moderate)   . Chronic systolic CHF (congestive heart failure)     EF 25-30% echo 12/12    Medications:  Prescriptions prior to admission  Medication Sig Dispense Refill  . allopurinol (ZYLOPRIM) 100 MG tablet Take 1 tablet (100 mg total) by mouth daily.  30 tablet  5  . carvedilol (COREG) 25 MG tablet Take 1 tablet (25 mg total) by mouth 2 (two) times daily with a meal.  60 tablet  5  . furosemide (LASIX) 80 MG tablet Take 80 mg by mouth daily.       . hydrALAZINE (APRESOLINE) 25 MG tablet Take 25 mg by mouth 3 (three) times daily.      . insulin aspart (NOVOLOG) 100 UNIT/ML injection Inject 10-15 Units into the skin 3 (three) times daily before meals. Per sliding scale      . insulin glargine (LANTUS) 100 UNIT/ML injection Inject 10  Units into the skin 2 (two) times daily.      . isosorbide dinitrate (ISORDIL) 20 MG tablet Take 20 mg by mouth 3 (three) times daily.      Marland Kitchen levothyroxine (SYNTHROID, LEVOTHROID) 50 MCG tablet Take 50 mcg by mouth every morning.       . metolazone (ZAROXOLYN) 2.5 MG tablet Take 2.5 mg by mouth daily. States he takes with his furosemide every day except on Tuesday and Friday      . omeprazole (PRILOSEC) 20 MG capsule Take 40 mg by mouth every morning.       . potassium chloride SA (K-DUR,KLOR-CON) 20 MEQ tablet Take 20 mEq by mouth 2 (two) times daily.      . simvastatin (ZOCOR) 40 MG tablet Take 40 mg by mouth at bedtime.       Marland Kitchen warfarin (COUMADIN) 5 MG tablet Take 2.5-5 mg by mouth every evening. Takes one tablet (5mg ) every day except on Tuesdays and Fridays, take one-half tablet (2.5mg )        Assessment: 77 yom admitted with an infected foot ulcer. He is on chronic anticoagulation for afib. His INR today is 1.89 He is slightly anemic and plts are WNL. No bleeding noted.  Goal of Therapy:  INR 2-3   Plan:  1. Coumadin 5mg  PO x  1 tonight  2. Daily INR   [name] 11/25/2012,8:52 AM

## 2012-11-25 NOTE — Progress Notes (Signed)
ANTICOAGULATION CONSULT NOTE   Pharmacy Consult for heparin Indication: atrial fibrillation  No Known Allergies  Patient Measurements: Height: 6\' 2"  (188 cm) Weight: 211 lb 8 oz (95.936 kg) IBW/kg (Calculated) : 82.2   Vital Signs: Temp: 98.2 F (36.8 C) (12/24 1341) Temp src: Oral (12/24 1341) BP: 117/55 mmHg (12/24 1637) Pulse Rate: 74  (12/24 1636)  Labs:  Basename 11/25/12 1823 11/25/12 0550 11/24/12 1758 11/24/12 1623  HGB -- -- 10.0* 9.7*  HCT -- -- 30.1* 29.5*  PLT -- -- 163 167  APTT -- -- -- --  LABPROT -- 21.0* -- 21.9*  INR -- 1.89* -- 2.00*  HEPARINUNFRC 0.23* -- -- --  CREATININE -- 2.33* -- 2.37*  CKTOTAL -- -- -- --  CKMB -- -- -- --  TROPONINI -- -- -- --    Estimated Creatinine Clearance: 31.8 ml/min (by C-G formula based on Cr of 2.33).   Medical History: Past Medical History  Diagnosis Date  . HTN (hypertension)   . PVD (peripheral vascular disease)   . COPD (chronic obstructive pulmonary disease)   . Diabetes mellitus     IDDM  . S/P CABG x 6 2003    Dr Cornelius Moras, Myoview low risk 2009  . Gout   . Paroxysmal atrial fibrillation   . Hypothyroidism   . ICD (implantable cardiac defibrillator) discharge 5/11    MDT  . Chronic renal insufficiency, stage III (moderate)   . Chronic systolic CHF (congestive heart failure)     EF 25-30% echo 12/12    Medications:  Prescriptions prior to admission  Medication Sig Dispense Refill  . allopurinol (ZYLOPRIM) 100 MG tablet Take 1 tablet (100 mg total) by mouth daily.  30 tablet  5  . carvedilol (COREG) 25 MG tablet Take 1 tablet (25 mg total) by mouth 2 (two) times daily with a meal.  60 tablet  5  . furosemide (LASIX) 80 MG tablet Take 80 mg by mouth daily.       . hydrALAZINE (APRESOLINE) 25 MG tablet Take 25 mg by mouth 3 (three) times daily.      . insulin aspart (NOVOLOG) 100 UNIT/ML injection Inject 10-15 Units into the skin 3 (three) times daily before meals. Per sliding scale      . insulin  glargine (LANTUS) 100 UNIT/ML injection Inject 10 Units into the skin 2 (two) times daily.      . isosorbide dinitrate (ISORDIL) 20 MG tablet Take 20 mg by mouth 3 (three) times daily.      Marland Kitchen levothyroxine (SYNTHROID, LEVOTHROID) 50 MCG tablet Take 50 mcg by mouth every morning.       . metolazone (ZAROXOLYN) 2.5 MG tablet Take 2.5 mg by mouth daily. States he takes with his furosemide every day except on Tuesday and Friday      . omeprazole (PRILOSEC) 20 MG capsule Take 40 mg by mouth every morning.       . potassium chloride SA (K-DUR,KLOR-CON) 20 MEQ tablet Take 20 mEq by mouth 2 (two) times daily.      . simvastatin (ZOCOR) 40 MG tablet Take 40 mg by mouth at bedtime.       Marland Kitchen warfarin (COUMADIN) 5 MG tablet Take 2.5-5 mg by mouth every evening. Takes one tablet (5mg ) every day except on Tuesdays and Fridays, take one-half tablet (2.5mg )        Assessment: 77 yom admitted with an infected foot ulcer. He is on chronic anticoagulation for afib. His INR today is 1.89  Plan to hold coumadin and start heparin as he needs angiogram.  Initial heparin level slightly less than desired goal.  No complications noted.  Goal of Therapy:  Heparin level 0.3-0.7 units/ml   Plan:  1. Increase heparin drip to 1450 units/hr 2. Check heparin level 8 hours after start. 3.  Daily HL and CBC while on hep[arin. 4. Monitor for s&s bleeding.  Tamyka Bezio L. Illene Bolus, PharmD, BCPS Clinical Pharmacist Pager: [phone] Pharmacy: [phone] 11/25/2012 7:09 PM

## 2012-11-26 LAB — PROTIME-INR
INR: 1.99 — ABNORMAL HIGH (ref 0.00–1.49)
Prothrombin Time: 21.8 seconds — ABNORMAL HIGH (ref 11.6–15.2)

## 2012-11-26 LAB — BASIC METABOLIC PANEL
BUN: 77 mg/dL — ABNORMAL HIGH (ref 6–23)
CO2: 27 mEq/L (ref 19–32)
Calcium: 9 mg/dL (ref 8.4–10.5)
Chloride: 96 mEq/L (ref 96–112)
Creatinine, Ser: 2.26 mg/dL — ABNORMAL HIGH (ref 0.50–1.35)
GFR calc Af Amer: 31 mL/min — ABNORMAL LOW (ref >90)
GFR calc non Af Amer: 27 mL/min — ABNORMAL LOW (ref >90)
Glucose, Bld: 314 mg/dL — ABNORMAL HIGH (ref 70–99)
Potassium: 3.3 mEq/L — ABNORMAL LOW (ref 3.5–5.1)
Sodium: 136 mEq/L (ref 135–145)

## 2012-11-26 LAB — WOUND CULTURE: Special Requests: NORMAL

## 2012-11-26 LAB — HEPARIN LEVEL (UNFRACTIONATED): Heparin Unfractionated: 0.4 IU/mL (ref 0.30–0.70)

## 2012-11-26 LAB — CBC
MCH: 27.2 pg (ref 26.0–34.0)
MCHC: 32.2 g/dL (ref 30.0–36.0)
Platelets: 184 10*3/uL (ref 150–400)
RDW: 16.5 % — ABNORMAL HIGH (ref 11.5–15.5)

## 2012-11-26 LAB — GLUCOSE, CAPILLARY
Glucose-Capillary: 269 mg/dL — ABNORMAL HIGH (ref 70–99)
Glucose-Capillary: 294 mg/dL — ABNORMAL HIGH (ref 70–99)
Glucose-Capillary: 329 mg/dL — ABNORMAL HIGH (ref 70–99)

## 2012-11-26 MED ORDER — DIAZEPAM 5 MG PO TABS
5.0000 mg | ORAL_TABLET | ORAL | Status: AC
  Administered 2012-11-27: 5 mg via ORAL
  Filled 2012-11-26: qty 1

## 2012-11-26 NOTE — Progress Notes (Addendum)
ANTICOAGULATION CONSULT NOTE   Pharmacy Consult for heparin Indication: atrial fibrillation  No Known Allergies  Patient Measurements: Height: 6\' 2"  (188 cm) Weight: 211 lb 8 oz (95.936 kg) IBW/kg (Calculated) : 82.2   Vital Signs: Temp: 98 F (36.7 C) (12/25 0512) Temp src: Oral (12/25 0512) BP: 135/59 mmHg (12/25 0512) Pulse Rate: 63  (12/25 0512)  Labs:  Basename 11/26/12 0455 11/25/12 1823 11/25/12 0550 11/24/12 1758 11/24/12 1623  HGB 8.8* -- -- 10.0* --  HCT 27.3* -- -- 30.1* 29.5*  PLT 184 -- -- 163 167  APTT -- -- -- -- --  LABPROT 21.8* -- 21.0* -- 21.9*  INR 1.99* -- 1.89* -- 2.00*  HEPARINUNFRC 0.40 0.23* -- -- --  CREATININE -- -- 2.33* -- 2.37*  CKTOTAL -- -- -- -- --  CKMB -- -- -- -- --  TROPONINI -- -- -- -- --    Estimated Creatinine Clearance: 31.8 ml/min (by C-G formula based on Cr of 2.33).   Medical History: Past Medical History  Diagnosis Date  . HTN (hypertension)   . PVD (peripheral vascular disease)   . COPD (chronic obstructive pulmonary disease)   . Diabetes mellitus     IDDM  . S/P CABG x 6 2003    Dr Cornelius Moras, Myoview low risk 2009  . Gout   . Paroxysmal atrial fibrillation   . Hypothyroidism   . ICD (implantable cardiac defibrillator) discharge 5/11    MDT  . Chronic renal insufficiency, stage III (moderate)   . Chronic systolic CHF (congestive heart failure)     EF 25-30% echo 12/12    Medications:  Prescriptions prior to admission  Medication Sig Dispense Refill  . allopurinol (ZYLOPRIM) 100 MG tablet Take 1 tablet (100 mg total) by mouth daily.  30 tablet  5  . carvedilol (COREG) 25 MG tablet Take 1 tablet (25 mg total) by mouth 2 (two) times daily with a meal.  60 tablet  5  . furosemide (LASIX) 80 MG tablet Take 80 mg by mouth daily.       . hydrALAZINE (APRESOLINE) 25 MG tablet Take 25 mg by mouth 3 (three) times daily.      . insulin aspart (NOVOLOG) 100 UNIT/ML injection Inject 10-15 Units into the skin 3 (three) times  daily before meals. Per sliding scale      . insulin glargine (LANTUS) 100 UNIT/ML injection Inject 10 Units into the skin 2 (two) times daily.      . isosorbide dinitrate (ISORDIL) 20 MG tablet Take 20 mg by mouth 3 (three) times daily.      Marland Kitchen levothyroxine (SYNTHROID, LEVOTHROID) 50 MCG tablet Take 50 mcg by mouth every morning.       . metolazone (ZAROXOLYN) 2.5 MG tablet Take 2.5 mg by mouth daily. States he takes with his furosemide every day except on Tuesday and Friday      . omeprazole (PRILOSEC) 20 MG capsule Take 40 mg by mouth every morning.       . potassium chloride SA (K-DUR,KLOR-CON) 20 MEQ tablet Take 20 mEq by mouth 2 (two) times daily.      . simvastatin (ZOCOR) 40 MG tablet Take 40 mg by mouth at bedtime.       Marland Kitchen warfarin (COUMADIN) 5 MG tablet Take 2.5-5 mg by mouth every evening. Takes one tablet (5mg ) every day except on Tuesdays and Fridays, take one-half tablet (2.5mg )        Assessment: 77 yom admitted with an infected foot  ulcer. He is on chronic anticoagulation for afib. Patient needs angiogram. Coumadin is on hold, and patient started on IV heparin. Heparin level (0.4) is at-goal on 1450 units/hr.   Goal of Therapy:  Heparin level 0.3-0.7 units/ml   Plan:  1. Continue IV heparin at 1450 units/hr 2. Follow up AM labs  Thank you. Okey Regal, PharmD [phone]

## 2012-11-26 NOTE — Progress Notes (Signed)
Subjective:  No SOB or chest pain.  Objective:  Vital Signs in the last 24 hours: Temp:  [98 F (36.7 C)-98.3 F (36.8 C)] 98 F (36.7 C) (12/25 0512) Pulse Rate:  [63-74] 63  (12/25 0512) Resp:  [18] 18  (12/25 0512) BP: (117-143)/(55-84) 135/59 mmHg (12/25 0512) SpO2:  [95 %-97 %] 96 % (12/25 0512)  Intake/Output from previous day:  Intake/Output Summary (Last 24 hours) at 11/26/12 0826 Last data filed at 11/26/12 0700  Gross per 24 hour  Intake 1087.17 ml  Output   2126 ml  Net -1038.83 ml    Physical Exam: General appearance: alert, cooperative and no distress Lungs: clear to auscultation bilaterally Heart: regular rate and rhythm   Rate: 65  Rhythm: normal sinus rhythm and 1st degree AVB  Lab Results:  [facility] 11/26/12 0455 11/24/12 1758  WBC 10.0 10.7*  HGB 8.8* 10.0*  PLT 184 163    Basename 11/26/12 0455 11/25/12 0550  NA 136 136  K 3.3* 3.3*  CL 96 94*  CO2 27 29  GLUCOSE 314* 227*  BUN 77* 76*  CREATININE 2.26* 2.33*   No results found for this basename: TROPONINI:2,CK,MB:2 in the last 72 hours Hepatic Function Panel  Basename 11/25/12 0550  PROT 6.7  ALBUMIN 2.8*  AST 17  ALT 7  ALKPHOS 117  BILITOT 0.7  BILIDIR --  IBILI --   No results found for this basename: CHOL in the last 72 hours  Basename 11/26/12 0455  INR 1.99*    Imaging: Imaging results have been reviewed  Cardiac Studies:  Assessment/Plan:   Principal Problem:  *Diabetic foot ulcer Active Problems:  CKD (chronic kidney disease) stage 4, GFR 15-29 ml/min  Ischemic cardiomyopathy, EF 25% 2D 12/12  PVD, Lt popliteal HSRA 7/13  Chronic anticoagulation  Non-sustained ventricular tachycardia  Chronic systolic CHF (congestive heart failure)  Hx of CABG x 6 2003  ICD in place, MDT May 2011  PAF (paroxysmal atrial fibrillation)  Diabetes mellitus type 2, insulin dependent  Positive Coombs test  Anemia due to chronic illness  HTN (hypertension)  Dyslipidemia  Sleep apnea, C-Pap intol  Gout  Hypothyroidism  Plan- Tentative PVA in am. Coumadin is on hold, he is on heparin. His SCr is down slightly to 2.2, diuretics on hold and he is being gently hydrated. Increase IVF today. NPO after midnight. Consider decreasing Coreg and adding Norvasc or increasing Hydralazine for B/P with long 1st degree AVB.    Corine Shelter PA-C 11/26/2012, 8:26 AM  Agree with note written by Corine Shelter PAC  INR 1.99. Coumadin on hold. SCr trending down slightly. Getting IVF. For PV angio tomorrow for CLI  Runell Gess 11/26/2012 9:22 AM

## 2012-11-27 ENCOUNTER — Encounter (HOSPITAL_COMMUNITY)
Admission: AD | Disposition: A | Discharge status: Rehabilitation Facility | Payer: Self-pay | Source: Ambulatory Visit | Attending: Cardiovascular Disease

## 2012-11-27 HISTORY — PX: LOWER EXTREMITY ANGIOGRAM: SHX5508

## 2012-11-27 LAB — PROTIME-INR
INR: 1.85 — ABNORMAL HIGH (ref 0.00–1.49)
INR: 1.84 — ABNORMAL HIGH (ref 0.00–1.49)
Prothrombin Time: 20.7 seconds — ABNORMAL HIGH (ref 11.6–15.2)
Prothrombin Time: 20.6 seconds — ABNORMAL HIGH (ref 11.6–15.2)

## 2012-11-27 LAB — BASIC METABOLIC PANEL
BUN: 68 mg/dL — ABNORMAL HIGH (ref 6–23)
CO2: 25 mEq/L (ref 19–32)
Calcium: 9.1 mg/dL (ref 8.4–10.5)
Chloride: 96 mEq/L (ref 96–112)
Creatinine, Ser: 1.99 mg/dL — ABNORMAL HIGH (ref 0.50–1.35)
GFR calc Af Amer: 36 mL/min — ABNORMAL LOW (ref >90)
GFR calc non Af Amer: 31 mL/min — ABNORMAL LOW (ref >90)
Glucose, Bld: 291 mg/dL — ABNORMAL HIGH (ref 70–99)
Potassium: 3.5 mEq/L (ref 3.5–5.1)
Sodium: 134 mEq/L — ABNORMAL LOW (ref 135–145)

## 2012-11-27 LAB — POCT ACTIVATED CLOTTING TIME
Activated Clotting Time: 241 seconds
Activated Clotting Time: 181 seconds

## 2012-11-27 LAB — GLUCOSE, CAPILLARY
Glucose-Capillary: 288 mg/dL — ABNORMAL HIGH (ref 70–99)
Glucose-Capillary: 292 mg/dL — ABNORMAL HIGH (ref 70–99)

## 2012-11-27 LAB — CBC
HCT: 27.6 % — ABNORMAL LOW (ref 39.0–52.0)
MCHC: 33 g/dL (ref 30.0–36.0)
Platelets: 209 10*3/uL (ref 150–400)
RDW: 16.6 % — ABNORMAL HIGH (ref 11.5–15.5)

## 2012-11-27 LAB — HEPARIN LEVEL (UNFRACTIONATED): Heparin Unfractionated: 0.44 IU/mL (ref 0.30–0.70)

## 2012-11-27 SURGERY — ANGIOGRAM, LOWER EXTREMITY
Anesthesia: LOCAL

## 2012-11-27 MED ORDER — FENTANYL CITRATE 0.05 MG/ML IJ SOLN
INTRAMUSCULAR | Status: AC
  Filled 2012-11-27: qty 2

## 2012-11-27 MED ORDER — SODIUM CHLORIDE 0.9 % IV SOLN: INTRAVENOUS | Status: AC

## 2012-11-27 MED ORDER — HEPARIN (PORCINE) IN NACL 100-0.45 UNIT/ML-% IJ SOLN
1450.0000 [IU]/h | INTRAMUSCULAR | Status: DC
Start: 2012-11-28 — End: 2012-11-28
  Administered 2012-11-28 (×2): 1450 [IU]/h via INTRAVENOUS
  Filled 2012-11-27 (×3): qty 250

## 2012-11-27 MED ORDER — MIDAZOLAM HCL 2 MG/2ML IJ SOLN
INTRAMUSCULAR | Status: AC
  Filled 2012-11-27: qty 2

## 2012-11-27 MED ORDER — MORPHINE SULFATE 2 MG/ML IJ SOLN
2.0000 mg | INTRAMUSCULAR | Status: DC | PRN
  Administered 2012-11-27: 2 mg via INTRAVENOUS
  Filled 2012-11-27: qty 1

## 2012-11-27 MED ORDER — ONDANSETRON HCL 4 MG/2ML IJ SOLN: 4.0000 mg | Freq: Four times a day (QID) | INTRAMUSCULAR | Status: DC | PRN

## 2012-11-27 MED ORDER — HEPARIN (PORCINE) IN NACL 2-0.9 UNIT/ML-% IJ SOLN
INTRAMUSCULAR | Status: AC
  Filled 2012-11-27: qty 1000

## 2012-11-27 MED ORDER — LIDOCAINE HCL (PF) 1 % IJ SOLN
INTRAMUSCULAR | Status: AC
  Filled 2012-11-27: qty 30

## 2012-11-27 MED ORDER — ACETAMINOPHEN 325 MG PO TABS: 650.0000 mg | ORAL_TABLET | ORAL | Status: DC | PRN

## 2012-11-27 MED ORDER — HEPARIN SODIUM (PORCINE) 1000 UNIT/ML IJ SOLN
INTRAMUSCULAR | Status: AC
  Filled 2012-11-27: qty 1

## 2012-11-27 NOTE — Progress Notes (Signed)
Inpatient Diabetes Program Recommendations  AACE/ADA: New Consensus Statement on Inpatient Glycemic Control (2013)  Target Ranges:  Prepandial:   less than 140 mg/dL      Peak postprandial:   less than 180 mg/dL (1-2 hours)      Critically ill patients:  140 - 180 mg/dL   Results for LENDELL, GALLICK (MRN [id number]) as of 11/27/2012 10:25  Ref. Range 11/26/2012 06:36 11/26/2012 11:39 11/26/2012 16:42 11/26/2012 21:14 11/27/2012 06:10  Glucose-Capillary Latest Range: 70-99 mg/dL 244 (H) 010 (H) 272 (H) 293 (H) 256 (H)    Inpatient Diabetes Program Recommendations Insulin - Basal: Please consider increasing Lantus to 10 units BID.  Note: At home patient takes Novolog 10-15 units TID before meals and Lantus 10 units BID.  Currently ordered Lantus 8 unit every morning with Novolog moderate correction.  Please consider increasing Lantus to 10 units BID since blood glucose has consistently been elevated and fasting CBG this am was 256 mg/dl.  Will continue to follow.  Thanks, Orlando Penner, RN, BSN, CCRN Diabetes Coordinator Inpatient Diabetes Program [phone]

## 2012-11-27 NOTE — Op Note (Signed)
[name] is a 77 y.o. male    [id number] LOCATION:  FACILITY: [facility]  PHYSICIAN: Nanetta Batty, M.D. 19-Sep-1935   DATE OF PROCEDURE:  11/27/2012  DATE OF DISCHARGE:  [facility]  CARDIAC CATHETERIZATION     History obtained from chart review. [name] is a 77 year old mildly overweight Caucasian male patient of Dr. Royann Shivers with history of ischemic cardiac myopathy, chronic atrial fibrillation on Coumadin anticoagulation, diabetes, hypertension, moderate chronic renal insufficiency and critical limb ischemia. I intervention 06/07/2012 because of nonhealing left heel ulcer. I performed diamondback orbital rotation arthrectomy and into sculpt cutting balloon intervention of a totally occluded below the popliteal and anterior tibial artery which resulted in healing of his heel wound with improvement in his ABIs. He tolerated the procedure well. He has an occluded peroneal and posterior tibial. He's had recurrent ulcers melena is he'll and on the ventral surface of his left foot that was being taken care of by Dr. Aldean Baker. He was admitted to the hospital on 1223 for critical limb ischemia. His Coumadin was held and he was hydrated for angiography and potential percutaneous renal intervention for limb salvage.   PROCEDURE DESCRIPTION:    The patient was brought to the second floor  [facility] lab in the postabsorptive state. He was premedicated with Valium 5 mg by mouth, IV Versed and fentanyl.Marland Kitchen His right groinwas prepped and shaved in usual sterile fashion. Xylocaine 1% was used  for local anesthesia. A 6 French sheath was inserted into the right common femoral artery using standard Seldinger technique. The patient received 5000 units  of heparin  intravenously.  If 5 French endhole catheter was placed into the left iliac system after contralateral access obtained with a 5 [place] crossover catheter and Versacore wire. Left lower cineangiography  was performed using digital subtraction bolus chase step table technique. Visipaque was used for the entirety of the case. Retrograde aortic pressures monitored during the case.    HEMODYNAMICS:    AO SYSTOLIC/AO DIASTOLIC: 140/70    ANGIOGRAPHIC RESULTS:   1: Left lower extremity-the short stent in the proximal left SFA was patent. The entire left SFA was fluoroscopic calcified with obvious diffuse filling defects with 50% segmental stenosis throughout its entirety. The below the knee popliteal artery which was previously atherectomized and endoplastic with 70-80% diffusely narrowed. The anterior tibial artery was patent down to the ankle which time it was abruptly occluded. The posterior tibial and peroneal were occluded as well.    IMPRESSION:moderate restenosis within the below the knee popliteal artery with a widely patent anterior tibial down to the ankle. The posterior tibial and peroneal are chronically occluded. We'll proceed with Angiosculpt  intervention of the below the knee popliteal.  Procedure description: Contralateral access was obtained with a 6 [place] destination sheath. The ACT was documented to 41. Total contrast administered the patient was 93 cc. Using an 014/300 [place] wire and an 018 quick cross end hole catheter the entire SFA was traversed down to the below-knee popliteal into the anterior tibial artery. Intervention was then performed with 4 mm x 4 cm long Angiosculpt  balloons with overlapping inflations at 8 atmospheres resulting in reduction of a long 75-80% segmental stenosis to less than 20% residual stenosis with 2 small dissections but did not appear to be flow limiting.  Overall impression: Excess full angioscope intervention of the segmental restenosis of the knee below the knee popliteal in the setting of critical limb ischemia with an  excellent antibiotic result not requiring stenting. The patient will need to be re heparinized because his A. Fib and   consideration given to either recurrent coumadinization and aggressive local wound care versus limited amputation.  Runell Gess MD, [facility] 11/27/2012 3:54 PM

## 2012-11-27 NOTE — Progress Notes (Signed)
ANTICOAGULATION CONSULT NOTE - Follow Up Consult  Pharmacy Consult for Heparin Indication: atrial fibrillation  No Known Allergies  Patient Measurements: Height: 6\' 2"  (188 cm) Weight: 212 lb 15.4 oz (96.6 kg) IBW/kg (Calculated) : 82.2  Heparin Dosing Weight:    Vital Signs: Temp: 98.6 F (37 C) (12/26 1403) Temp src: Oral (12/26 1403) BP: 154/73 mmHg (12/26 1403) Pulse Rate: 67  (12/26 1403)  Labs:  Basename 11/27/12 1301 11/27/12 0505 11/26/12 0455 11/25/12 1823 11/25/12 0550 11/24/12 1758  HGB -- 9.1* 8.8* -- -- --  HCT -- 27.6* 27.3* -- -- 30.1*  PLT -- 209 184 -- -- 163  APTT -- -- -- -- -- --  LABPROT 20.6* 20.7* 21.8* -- -- --  INR 1.84* 1.85* 1.99* -- -- --  HEPARINUNFRC -- 0.44 0.40 0.23* -- --  CREATININE -- 1.99* 2.26* -- 2.33* --  CKTOTAL -- -- -- -- -- --  CKMB -- -- -- -- -- --  TROPONINI -- -- -- -- -- --    Estimated Creatinine Clearance: 37.3 ml/min (by C-G formula based on Cr of 1.99).  Assessment: h/o Afib S/p angioscope intervention of the segmental restenosis of the below the knee popliteal in the setting of critical limb ischemia with an excellent antibiotic result not requiring stenting. MD would like to resume heparin 4 hrs post-groin sheath removal. Heparin level 0.44 this am in goal range.    Goal of Therapy:  Heparin level 0.3-0.7 units/ml Monitor platelets by anticoagulation protocol: Yes   Plan:  4 hrs post-sheath removal, resume IV heparin at 1450 units/hr. Will recheck heparin level 6-8 hrs later.  Merilynn Finland, [employer] 11/27/2012,4:10 PM

## 2012-11-27 NOTE — H&P (Signed)
    Pt was reexamined and existing H & P reviewed. No changes found.  Runell Gess, MD [facility] 11/27/2012 3:50 PM

## 2012-11-27 NOTE — Progress Notes (Signed)
ANTICOAGULATION CONSULT NOTE   Pharmacy Consult for heparin Indication: atrial fibrillation  No Known Allergies  Patient Measurements: Height: 6\' 2"  (188 cm) Weight: 212 lb 15.4 oz (96.6 kg) IBW/kg (Calculated) : 82.2   Vital Signs: Temp: 98.6 F (37 C) (12/26 0416) Temp src: Oral (12/26 0416) BP: 160/62 mmHg (12/26 0831) Pulse Rate: 70  (12/26 0416)  Labs:  Basename 11/27/12 0505 11/26/12 0455 11/25/12 1823 11/25/12 0550 11/24/12 1758  HGB 9.1* 8.8* -- -- --  HCT 27.6* 27.3* -- -- 30.1*  PLT 209 184 -- -- 163  APTT -- -- -- -- --  LABPROT 20.7* 21.8* -- 21.0* --  INR 1.85* 1.99* -- 1.89* --  HEPARINUNFRC 0.44 0.40 0.23* -- --  CREATININE 1.99* 2.26* -- 2.33* --  CKTOTAL -- -- -- -- --  CKMB -- -- -- -- --  TROPONINI -- -- -- -- --    Estimated Creatinine Clearance: 37.3 ml/min (by C-G formula based on Cr of 1.99).   Medical History: Past Medical History  Diagnosis Date  . HTN (hypertension)   . PVD (peripheral vascular disease)   . COPD (chronic obstructive pulmonary disease)   . Diabetes mellitus     IDDM  . S/P CABG x 6 2003    Dr Cornelius Moras, Myoview low risk 2009  . Gout   . Paroxysmal atrial fibrillation   . Hypothyroidism   . ICD (implantable cardiac defibrillator) discharge 5/11    MDT  . Chronic renal insufficiency, stage III (moderate)   . Chronic systolic CHF (congestive heart failure)     EF 25-30% echo 12/12    Medications:  Prescriptions prior to admission  Medication Sig Dispense Refill  . allopurinol (ZYLOPRIM) 100 MG tablet Take 1 tablet (100 mg total) by mouth daily.  30 tablet  5  . carvedilol (COREG) 25 MG tablet Take 1 tablet (25 mg total) by mouth 2 (two) times daily with a meal.  60 tablet  5  . furosemide (LASIX) 80 MG tablet Take 80 mg by mouth daily.       . hydrALAZINE (APRESOLINE) 25 MG tablet Take 25 mg by mouth 3 (three) times daily.      . insulin aspart (NOVOLOG) 100 UNIT/ML injection Inject 10-15 Units into the skin 3 (three)  times daily before meals. Per sliding scale      . insulin glargine (LANTUS) 100 UNIT/ML injection Inject 10 Units into the skin 2 (two) times daily.      . isosorbide dinitrate (ISORDIL) 20 MG tablet Take 20 mg by mouth 3 (three) times daily.      Marland Kitchen levothyroxine (SYNTHROID, LEVOTHROID) 50 MCG tablet Take 50 mcg by mouth every morning.       . metolazone (ZAROXOLYN) 2.5 MG tablet Take 2.5 mg by mouth daily. States he takes with his furosemide every day except on Tuesday and Friday      . omeprazole (PRILOSEC) 20 MG capsule Take 40 mg by mouth every morning.       . potassium chloride SA (K-DUR,KLOR-CON) 20 MEQ tablet Take 20 mEq by mouth 2 (two) times daily.      . simvastatin (ZOCOR) 40 MG tablet Take 40 mg by mouth at bedtime.       Marland Kitchen warfarin (COUMADIN) 5 MG tablet Take 2.5-5 mg by mouth every evening. Takes one tablet (5mg ) every day except on Tuesdays and Fridays, take one-half tablet (2.5mg )        Assessment: 77 yom admitted with an infected foot  ulcer. He is on chronic anticoagulation for afib. Patient needs angiogram. Coumadin is on hold, and patient started on IV heparin. Heparin level (0.44) is at-goal on 1450 units/hr.   Goal of Therapy:  Heparin level 0.3-0.7 units/ml   Plan:  1. Continue IV heparin at 1450 units/hr 2. Follow up AM labs  Thank you. Talbert Cage, PharmD [phone]

## 2012-11-27 NOTE — Progress Notes (Signed)
Subjective:  No SOB  Objective:  Vital Signs in the last 24 hours: Temp:  [97.6 F (36.4 C)-98.8 F (37.1 C)] 98.6 F (37 C) (12/26 0416) Pulse Rate:  [60-70] 70  (12/26 0416) Resp:  [20] 20  (12/26 0416) BP: (122-160)/(60-71) 160/62 mmHg (12/26 0831) SpO2:  [96 %-100 %] 96 % (12/26 0416) Weight:  [96.6 kg (212 lb 15.4 oz)] 96.6 kg (212 lb 15.4 oz) (12/26 0416)  Intake/Output from previous day:  Intake/Output Summary (Last 24 hours) at 11/27/12 1029 Last data filed at 11/27/12 0900  Gross per 24 hour  Intake    240 ml  Output   1125 ml  Net   -885 ml    Physical Exam: General appearance: alert, cooperative and no distress Lungs: clear to auscultation bilaterally Heart: regular rate and rhythm   Rate: 70  Rhythm: normal sinus rhythm and 1st degree AVB  Lab Results:  Basename 11/27/12 0505 11/26/12 0455  WBC 11.7* 10.0  HGB 9.1* 8.8*  PLT 209 184    Basename 11/27/12 0505 11/26/12 0455  NA 134* 136  K 3.5 3.3*  CL 96 96  CO2 25 27  GLUCOSE 291* 314*  BUN 68* 77*  CREATININE 1.99* 2.26*   No results found for this basename: TROPONINI:2,CK,MB:2 in the last 72 hours Hepatic Function Panel  Basename 11/25/12 0550  PROT 6.7  ALBUMIN 2.8*  AST 17  ALT 7  ALKPHOS 117  BILITOT 0.7  BILIDIR --  IBILI --   No results found for this basename: CHOL in the last 72 hours  Basename 11/27/12 0505  INR 1.85*    Imaging: Imaging results have been reviewed  Cardiac Studies:  Assessment/Plan:   Principal Problem:  *Diabetic foot ulcer Active Problems:  CKD (chronic kidney disease) stage 4, GFR 15-29 ml/min  Ischemic cardiomyopathy, EF 25% 2D 12/12  PVD, Lt popliteal HSRA 7/13  Chronic anticoagulation  Non-sustained ventricular tachycardia  Chronic systolic CHF (congestive heart failure)  Hx of CABG x 6 2003  ICD in place, MDT May 2011  PAF (paroxysmal atrial fibrillation)  Diabetes mellitus type 2, insulin dependent  Positive Coombs test  Anemia due  to chronic illness  HTN (hypertension)  Dyslipidemia  Sleep apnea, C-Pap intol  Gout  Hypothyroidism   Plan- SCr improved with hydration and holding diuretic. His INR is still high at 1.85, will re check later today.    Corine Shelter PA-C 11/27/2012, 10:29 AM

## 2012-11-28 ENCOUNTER — Encounter (HOSPITAL_COMMUNITY): Payer: Self-pay | Admitting: Cardiology

## 2012-11-28 DIAGNOSIS — Z9862 Peripheral vascular angioplasty status: Secondary | ICD-10-CM

## 2012-11-28 HISTORY — DX: Peripheral vascular angioplasty status: Z98.62

## 2012-11-28 LAB — HEPARIN LEVEL (UNFRACTIONATED)
Heparin Unfractionated: 0.17 IU/mL — ABNORMAL LOW (ref 0.30–0.70)
Heparin Unfractionated: 0.3 IU/mL (ref 0.30–0.70)

## 2012-11-28 LAB — BASIC METABOLIC PANEL
BUN: 61 mg/dL — ABNORMAL HIGH (ref 6–23)
Calcium: 9.3 mg/dL (ref 8.4–10.5)
GFR calc Af Amer: 36 mL/min — ABNORMAL LOW (ref >90)
GFR calc non Af Amer: 31 mL/min — ABNORMAL LOW (ref >90)
Glucose, Bld: 247 mg/dL — ABNORMAL HIGH (ref 70–99)
Potassium: 3.6 mEq/L (ref 3.5–5.1)
Sodium: 137 mEq/L (ref 135–145)

## 2012-11-28 LAB — CBC
HCT: 28 % — ABNORMAL LOW (ref 39.0–52.0)
MCH: 27.6 pg (ref 26.0–34.0)
MCHC: 32.1 g/dL (ref 30.0–36.0)
RDW: 16.9 % — ABNORMAL HIGH (ref 11.5–15.5)

## 2012-11-28 LAB — GLUCOSE, CAPILLARY
Glucose-Capillary: 271 mg/dL — ABNORMAL HIGH (ref 70–99)
Glucose-Capillary: 251 mg/dL — ABNORMAL HIGH (ref 70–99)
Glucose-Capillary: 227 mg/dL — ABNORMAL HIGH (ref 70–99)

## 2012-11-28 MED ORDER — INSULIN GLARGINE 100 UNIT/ML ~~LOC~~ SOLN
10.0000 [IU] | Freq: Two times a day (BID) | SUBCUTANEOUS | Status: DC
  Administered 2012-11-28 – 2012-12-01 (×7): 10 [IU] via SUBCUTANEOUS
  Administered 2012-12-02: 12:00:00 via SUBCUTANEOUS
  Administered 2012-12-02 – 2012-12-06 (×9): 10 [IU] via SUBCUTANEOUS

## 2012-11-28 MED ORDER — WARFARIN - PHARMACIST DOSING INPATIENT: Freq: Every day | Status: DC

## 2012-11-28 MED ORDER — HEPARIN (PORCINE) IN NACL 100-0.45 UNIT/ML-% IJ SOLN
1850.0000 [IU]/h | INTRAMUSCULAR | Status: DC
  Administered 2012-11-28: 21:00:00 1650 [IU]/h via INTRAVENOUS
  Administered 2012-11-29 – 2012-12-01 (×2): 1850 [IU]/h via INTRAVENOUS
  Filled 2012-11-28 (×6): qty 250

## 2012-11-28 MED ORDER — SODIUM CHLORIDE 0.9 % IV SOLN
INTRAVENOUS | Status: DC
  Administered 2012-11-28: 1000 mL via INTRAVENOUS
  Administered 2012-11-29: 10:00:00 via INTRAVENOUS
  Administered 2012-11-30: 1000 mL via INTRAVENOUS

## 2012-11-28 MED ORDER — WARFARIN SODIUM 5 MG PO TABS
5.0000 mg | ORAL_TABLET | Freq: Once | ORAL | Status: AC
  Administered 2012-11-28: 17:00:00 5 mg via ORAL
  Filled 2012-11-28: qty 1

## 2012-11-28 NOTE — Progress Notes (Signed)
Monitor tech informs the nurse that the patient experienced 6 beats of V-tach. Nurse checks in on the patient, and he was asymptomatic. HR 68. Nurse pages the Dr on call for berry. Nurse is waiting on return page.

## 2012-11-28 NOTE — Progress Notes (Signed)
ANTICOAGULATION CONSULT NOTE - Follow Up Consult  Pharmacy Consult for heparin Indication: atrial fibrillation and PVD  No Known Allergies  Patient Measurements: Height: 6\' 2"  (188 cm) Weight: 212 lb 4.9 oz (96.3 kg) IBW/kg (Calculated) : 82.2  Heparin Dosing Weight: 96.3 kg  Vital Signs: Temp: 97.7 F (36.5 C) (12/27 1200) Temp src: Oral (12/27 1200) BP: 135/70 mmHg (12/27 1200) Pulse Rate: 66  (12/27 1200)  Labs:  Basename 11/28/12 0610 11/27/12 2051 11/27/12 1301 11/27/12 0505 11/26/12 0455  HGB 9.0* -- -- 9.1* --  HCT 28.0* -- -- 27.6* 27.3*  PLT 203 -- -- 209 184  APTT -- -- -- -- --  LABPROT -- 19.0* 20.6* 20.7* --  INR -- 1.65* 1.84* 1.85* --  HEPARINUNFRC 0.17* -- -- 0.44 0.40  CREATININE 2.01* -- -- 1.99* 2.26*  CKTOTAL -- -- -- -- --  CKMB -- -- -- -- --  TROPONINI -- -- -- -- --    Estimated Creatinine Clearance: 36.9 ml/min (by C-G formula based on Cr of 2.01).   Medications:  Scheduled:    . allopurinol  100 mg Oral Daily  . carvedilol  25 mg Oral BID WC  . [COMPLETED] fentaNYL      . [COMPLETED] heparin      . [COMPLETED] heparin      . hydrALAZINE  25 mg Oral TID  . insulin aspart  0-15 Units Subcutaneous TID WC  . insulin aspart  0-5 Units Subcutaneous QHS  . insulin glargine  10 Units Subcutaneous BID  . isosorbide dinitrate  20 mg Oral TID  . levothyroxine  50 mcg Oral QAC breakfast  . [COMPLETED] lidocaine      . [COMPLETED] midazolam      . pantoprazole  40 mg Oral Daily  . simvastatin  40 mg Oral QHS  . warfarin  5 mg Oral ONCE-1800  . Warfarin - Pharmacist Dosing Inpatient   Does not apply q1800  . [DISCONTINUED] insulin glargine  8 Units Subcutaneous BH-q7a   Infusions:    . [EXPIRED] sodium chloride    . sodium chloride 1,000 mL (11/28/12 1322)  . heparin 1,450 Units/hr (11/28/12 0137)  . [DISCONTINUED] sodium chloride 75 mL/hr at 11/27/12 0845  . [DISCONTINUED] heparin 1,450 Units/hr (11/26/12 2117)    Assessment: 77 yo  male with atrial fibrillation and PVD is currently on therapeutic heparin.  Heparin level was 0.3.  H/H 9/28 and Plt 203.  Goal of Therapy:  Heparin level 0.3-0.7 units/ml Monitor platelets by anticoagulation protocol: Yes   Plan:  1) Cont heparin at 1450 units/hr.  Repeat a heparin level at 2000 to reconfirm. 2) Daily heparin level and CBC  Elion Hocker, [name] 11/28/2012,2:46 PM

## 2012-11-28 NOTE — Progress Notes (Signed)
PA. Diona Fanti returned the nurse paged. Nurse reported to the PA that the patient experienced 6 beats of V-Tach. Diona Fanti said thanks for the notification. Harmon Pier

## 2012-11-28 NOTE — Progress Notes (Addendum)
ANTICOAGULATION CONSULT NOTE - Initial Consult  Pharmacy Consult for coumadin  Indication: atrial fibrillation and PVD  No Known Allergies  Patient Measurements: Height: 6\' 2"  (188 cm) Weight: 212 lb 4.9 oz (96.3 kg) IBW/kg (Calculated) : 82.2  Heparin Dosing Weight:   Vital Signs: Temp: 97.9 F (36.6 C) (12/27 0733) Temp src: Oral (12/27 0733) BP: 142/52 mmHg (12/27 0733) Pulse Rate: 67  (12/27 0733)  Labs:  Basename 11/28/12 0610 11/27/12 2051 11/27/12 1301 11/27/12 0505 11/26/12 0455  HGB 9.0* -- -- 9.1* --  HCT 28.0* -- -- 27.6* 27.3*  PLT 203 -- -- 209 184  APTT -- -- -- -- --  LABPROT -- 19.0* 20.6* 20.7* --  INR -- 1.65* 1.84* 1.85* --  HEPARINUNFRC 0.17* -- -- 0.44 0.40  CREATININE 2.01* -- -- 1.99* 2.26*  CKTOTAL -- -- -- -- --  CKMB -- -- -- -- --  TROPONINI -- -- -- -- --    Estimated Creatinine Clearance: 36.9 ml/min (by C-G formula based on Cr of 2.01).   Medical History: Past Medical History  Diagnosis Date  . HTN (hypertension)   . PVD (peripheral vascular disease)   . COPD (chronic obstructive pulmonary disease)   . Diabetes mellitus     IDDM  . S/P CABG x 6 2003    Dr Cornelius Moras, Myoview low risk 2009  . Gout   . Paroxysmal atrial fibrillation   . Hypothyroidism   . ICD (implantable cardiac defibrillator) discharge 5/11    MDT  . Chronic renal insufficiency, stage III (moderate)   . Chronic systolic CHF (congestive heart failure)     EF 25-30% echo 12/12  . S/P angioplasty, 11/27/12 -Lt below the knee popliteal artery 11/28/2012    Medications:  Scheduled:    . allopurinol  100 mg Oral Daily  . carvedilol  25 mg Oral BID WC  . [COMPLETED] diazepam  5 mg Oral On Call  . [COMPLETED] fentaNYL      . [COMPLETED] heparin      . [COMPLETED] heparin      . hydrALAZINE  25 mg Oral TID  . insulin aspart  0-15 Units Subcutaneous TID WC  . insulin aspart  0-5 Units Subcutaneous QHS  . insulin glargine  10 Units Subcutaneous BID  . isosorbide  dinitrate  20 mg Oral TID  . levothyroxine  50 mcg Oral QAC breakfast  . [COMPLETED] lidocaine      . [COMPLETED] midazolam      . pantoprazole  40 mg Oral Daily  . simvastatin  40 mg Oral QHS  . warfarin  5 mg Oral ONCE-1800  . Warfarin - Pharmacist Dosing Inpatient   Does not apply q1800  . [DISCONTINUED] insulin glargine  8 Units Subcutaneous BH-q7a   Infusions:    . [EXPIRED] sodium chloride    . sodium chloride 50 mL/hr at 11/28/12 0943  . heparin 1,450 Units/hr (11/28/12 0137)  . [DISCONTINUED] sodium chloride 75 mL/hr at 11/27/12 0845  . [DISCONTINUED] heparin 1,450 Units/hr (11/26/12 2117)    Assessment: 77 yo male with afib will be put back on coumadin s/p angio.  INR yesterday was 1.65.  Also on heparin currently.  Was on coumadin 5mg  po qd except 2.5mg  on Tues and Fri prior to admission.    Goal of Therapy:  INR 2-3    Plan:  1) coumadin 5mg  po x1 2) daily PT/INR  Catherin Doorn, [name] 11/28/2012,10:13 AM

## 2012-11-28 NOTE — Progress Notes (Signed)
Subjective: Post procedure day 1 , PV angio successful angioscult intervention of the segmental restenosis of the knee below the knee popliteal in the setting of critical limb ischemia with an excellent result not requiring stenting. Leg pain significantly improve but still with mild residual discomfort.   Objective: Vital signs in last 24 hours: Temp:  [97.6 F (36.4 C)-100.1 F (37.8 C)] 97.9 F (36.6 C) (12/27 0733) Pulse Rate:  [56-71] 67  (12/27 0733) Resp:  [16-27] 22  (12/27 0733) BP: (115-160)/(45-83) 142/52 mmHg (12/27 0733) SpO2:  [95 %-97 %] 96 % (12/27 0733) Weight:  [96.3 kg (212 lb 4.9 oz)] 96.3 kg (212 lb 4.9 oz) (12/27 0030) Weight change: -0.3 kg (-10.6 oz) Last BM Date: 11/26/12 Intake/Output from previous day: -577 12/26 0701 - 12/27 0700 In: 747.5 [I.V.:747.5] Out: 1325 [Urine:1325] Intake/Output this shift:    PE: General: Alert No JVD Heart: RRR 1/6 sem Lungs: decreased BS Abd: soft, BS+ Groin site stable YQM:VHQI discolored, but warm, left foot bandaged.   Lab Results:  [name] 11/28/12 0610 11/27/12 0505  WBC 11.8* 11.7*  HGB 9.0* 9.1*  HCT 28.0* 27.6*  PLT 203 209   BMET  Basename 11/28/12 0610 11/27/12 0505  NA 137 134*  K 3.6 3.5  CL 99 96  CO2 26 25  GLUCOSE 247* 291*  BUN 61* 68*  CREATININE 2.01* 1.99*  CALCIUM 9.3 9.1   No results found for this basename: TROPONINI:2,CK,MB:2 in the last 72 hours  Lab Results  Component Value Date   CHOL  Value: 115        ATP III CLASSIFICATION:  <200     mg/dL   Desirable  696-295  mg/dL   Borderline High  >=284    mg/dL   High 13/01/4400   HDL 25* 11/07/2007   LDLCALC  Value: 50        Total Cholesterol/HDL:CHD Risk Coronary Heart Disease Risk Table                     Men   Women  1/2 Average Risk   3.4   3.3 11/07/2007   TRIG 198* 11/07/2007   CHOLHDL 4.6 11/07/2007   Lab Results  Component Value Date   HGBA1C 8.4* 11/24/2012     Lab Results  Component Value Date   TSH 1.293 11/24/2012     Studies/Results: 11/27/12 successfull angioscope intervention of the segmental restenosis of the knee below the knee popliteal in the setting of critical limb ischemia with an excellent angioscope result not requiring stenting   Medications: I have reviewed the patient's current medications. Scheduled Meds:    . allopurinol  100 mg Oral Daily  . carvedilol  25 mg Oral BID WC  . hydrALAZINE  25 mg Oral TID  . insulin aspart  0-15 Units Subcutaneous TID WC  . insulin aspart  0-5 Units Subcutaneous QHS  . insulin glargine  8 Units Subcutaneous [id number]  . isosorbide dinitrate  20 mg Oral TID  . levothyroxine  50 mcg Oral QAC breakfast  . pantoprazole  40 mg Oral Daily  . simvastatin  40 mg Oral QHS   Continuous Infusions:    . heparin 1,450 Units/hr (11/28/12 0137)   PRN Meds:.acetaminophen, acetaminophen, morphine injection, ondansetron (ZOFRAN) IV, ondansetron (ZOFRAN) IV, oxyCODONE-acetaminophen, traMADol, zolpidem  Assessment/Plan: Principal Problem:  *Diabetic foot ulcer Active Problems:  CKD (chronic kidney disease) stage 4, GFR 15-29 ml/min  PVD, Lt popliteal HSRA 7/13  Non-sustained ventricular tachycardia  ICD in place, MDT May 2011  Diabetes mellitus type 2, insulin dependent  Ischemic cardiomyopathy, EF 25% 2D 12/12  Chronic anticoagulation  Chronic systolic CHF (congestive heart failure)  Hx of CABG x 6 2003  PAF (paroxysmal atrial fibrillation)  Sleep apnea, C-Pap intol  Gout  Hypothyroidism  Positive Coombs test  Anemia due to chronic illness  HTN (hypertension)  Dyslipidemia  S/P angioplasty, 11/27/12 -Lt below the knee popliteal artery  PLAN: reheparinized due to a fib, decision now ? Resume coumadin vs. Limited amputation.  INR 1.84  Cr. With increase since procedure Anemia continues was 9.7 on admit. Glucose elevated will increase Lantus to home dose, monitoring for hypoglycemia.   LOS: 4 days   [name],[name] 11/28/2012, 8:10  AM    Patient seen and examined. Agree with assessment and plan. Pt feels better, legs are warm; pain significantly improved. Will resume coumadin. Hydrate with CR 2.01.   Bruce Bihari, MD, [facility] 11/28/2012 9:23 AM

## 2012-11-28 NOTE — Progress Notes (Signed)
ANTICOAGULATION CONSULT NOTE - Follow Up Consult  Pharmacy Consult for Heparin Indication: atrial fibrillation  No Known Allergies  Patient Measurements: Height: 6\' 2"  (188 cm) Weight: 212 lb 4.9 oz (96.3 kg) IBW/kg (Calculated) : 82.2  Heparin Dosing Weight:    Vital Signs: Temp: 98.1 F (36.7 C) (12/27 0500) Temp src: Oral (12/27 0500) BP: 115/55 mmHg (12/27 0500) Pulse Rate: 71  (12/27 0500)  Labs:  Basename 11/28/12 0610 11/27/12 2051 11/27/12 1301 11/27/12 0505 11/26/12 0455  HGB 9.0* -- -- 9.1* --  HCT 28.0* -- -- 27.6* 27.3*  PLT 203 -- -- 209 184  APTT -- -- -- -- --  LABPROT -- 19.0* 20.6* 20.7* --  INR -- 1.65* 1.84* 1.85* --  HEPARINUNFRC 0.17* -- -- 0.44 0.40  CREATININE -- -- -- 1.99* 2.26*  CKTOTAL -- -- -- -- --  CKMB -- -- -- -- --  TROPONINI -- -- -- -- --    Estimated Creatinine Clearance: 37.3 ml/min (by C-G formula based on Cr of 1.99).  Assessment:  77 yo male with Afib, s/p peripheral angioplasty, for Heparin  Goal of Therapy:  Heparin level 0.3-0.7 units/ml Monitor platelets by anticoagulation protocol: Yes   Plan:  Expect level  to increase with additional time (previously therapeutic at current rate) , so will continue heparin unchanged  for now, recheck level this afternoon.  Eddie Candle 11/28/2012,7:20 AM

## 2012-11-28 NOTE — Progress Notes (Signed)
ANTICOAGULATION CONSULT NOTE - Follow Up Consult  Pharmacy Consult for Heparin Indication: atrial fibrillation and PVD  No Known Allergies  Patient Measurements: Height: 6\' 2"  (188 cm) Weight: 212 lb 4.9 oz (96.3 kg) IBW/kg (Calculated) : 82.2  Heparin Dosing Weight: 96.3kg  Vital Signs: Temp: 98.5 F (36.9 C) (12/27 2000) Temp src: Oral (12/27 2000) BP: 116/59 mmHg (12/27 2000) Pulse Rate: 67  (12/27 2000)  Labs:  Basename 11/28/12 1950 11/28/12 1424 11/28/12 0610 11/27/12 2051 11/27/12 1301 11/27/12 0505 11/26/12 0455  HGB -- -- 9.0* -- -- 9.1* --  HCT -- -- 28.0* -- -- 27.6* 27.3*  PLT -- -- 203 -- -- 209 184  APTT -- -- -- -- -- -- --  LABPROT -- -- -- 19.0* 20.6* 20.7* --  INR -- -- -- 1.65* 1.84* 1.85* --  HEPARINUNFRC 0.21* 0.30 0.17* -- -- -- --  CREATININE -- -- 2.01* -- -- 1.99* 2.26*  CKTOTAL -- -- -- -- -- -- --  CKMB -- -- -- -- -- -- --  TROPONINI -- -- -- -- -- -- --    Estimated Creatinine Clearance: 36.9 ml/min (by C-G formula based on Cr of 2.01).   Medications:  Heparin at 1450 units/hr  Assessment: 77yom continues on heparin for afib and PVD. Confirmatory heparin level is subtherapeutic. No issues with the infusion. No bleeding reported by the patient. Renal function is slightly improved from a few days ago.  Goal of Therapy:  Heparin level 0.3-0.7 units/ml Monitor platelets by anticoagulation protocol: Yes   Plan:  1) Increase heparin to 1650 units/hr 2) Follow up heparin level with AM labs (will be ~8h level)  Fredrik Rigger 11/28/2012,8:36 PM

## 2012-11-29 LAB — CBC
MCH: 28.6 pg (ref 26.0–34.0)
MCHC: 33.2 g/dL (ref 30.0–36.0)
MCV: 86 fL (ref 78.0–100.0)
Platelets: 195 10*3/uL (ref 150–400)
RBC: 3.15 MIL/uL — ABNORMAL LOW (ref 4.22–5.81)
RDW: 17.2 % — ABNORMAL HIGH (ref 11.5–15.5)

## 2012-11-29 LAB — HEPARIN LEVEL (UNFRACTIONATED)
Heparin Unfractionated: 0.21 IU/mL — ABNORMAL LOW (ref 0.30–0.70)
Heparin Unfractionated: 0.35 IU/mL (ref 0.30–0.70)

## 2012-11-29 LAB — BASIC METABOLIC PANEL
BUN: 66 mg/dL — ABNORMAL HIGH (ref 6–23)
Calcium: 9.1 mg/dL (ref 8.4–10.5)
Creatinine, Ser: 2.32 mg/dL — ABNORMAL HIGH (ref 0.50–1.35)
GFR calc Af Amer: 30 mL/min — ABNORMAL LOW (ref >90)
GFR calc non Af Amer: 26 mL/min — ABNORMAL LOW (ref >90)

## 2012-11-29 LAB — GLUCOSE, CAPILLARY: Glucose-Capillary: 156 mg/dL — ABNORMAL HIGH (ref 70–99)

## 2012-11-29 LAB — PROTIME-INR: Prothrombin Time: 20.7 seconds — ABNORMAL HIGH (ref 11.6–15.2)

## 2012-11-29 MED ORDER — WARFARIN SODIUM 5 MG PO TABS
5.0000 mg | ORAL_TABLET | Freq: Once | ORAL | Status: AC
  Filled 2012-11-29: qty 1

## 2012-11-29 NOTE — Progress Notes (Signed)
ANTICOAGULATION CONSULT NOTE - Follow Up Consult  Pharmacy Consult for Heparin/coumadin Indication: atrial fibrillation and PVD  No Known Allergies  Patient Measurements: Height: 6\' 2"  (188 cm) Weight: 212 lb 8.4 oz (96.4 kg) IBW/kg (Calculated) : 82.2  Heparin Dosing Weight: 96.3kg  Vital Signs: Temp: 98.3 F (36.8 C) (12/28 1136) Temp src: Oral (12/28 1136) BP: 119/69 mmHg (12/28 1136) Pulse Rate: 70  (12/28 1136)  Labs:  Basename 11/29/12 1601 11/29/12 0700 11/28/12 1950 11/28/12 0610 11/27/12 2051 11/27/12 1301 11/27/12 0505  HGB -- 9.0* -- 9.0* -- -- --  HCT -- 27.1* -- 28.0* -- -- 27.6*  PLT -- 195 -- 203 -- -- 209  APTT -- -- -- -- -- -- --  LABPROT -- 20.7* -- -- 19.0* 20.6* --  INR -- 1.85* -- -- 1.65* 1.84* --  HEPARINUNFRC 0.35 0.21* 0.21* -- -- -- --  CREATININE -- 2.32* -- 2.01* -- -- 1.99*  CKTOTAL -- -- -- -- -- -- --  CKMB -- -- -- -- -- -- --  TROPONINI -- -- -- -- -- -- --    Estimated Creatinine Clearance: 32 ml/min (by C-G formula based on Cr of 2.32).   Medications:  Heparin at 1650 units/hr  Assessment: 77yom continues on heparin bridge to couamdin for afib and PVD. Heparin level is therapeutic after rate increase this morning. INR 1.85, trending up nicely. No issues with the infusion. No bleeding reported   Coumadin PTA dose: 5mg  every day except on Tuesdays and Fridays, take 2.5mg   Goal of Therapy:  Heparin level 0.3-0.7 units/ml Monitor platelets by anticoagulation protocol: Yes   Plan:  1) Continue heparin infusion 1850 units/hr 2) Coumadin 5mg  po x 1 tonight 3) f/u AM PT/INR, heparin level and cbc   Thank you,  Bayard Hugger, PharmD, BCPS  Clinical Pharmacist  Pager: [phone]   11/29/2012 5:59 PM

## 2012-11-29 NOTE — Progress Notes (Signed)
ANTICOAGULATION CONSULT NOTE - Follow Up Consult  Pharmacy Consult for Heparin Indication: atrial fibrillation and PVD  No Known Allergies  Patient Measurements: Height: 6\' 2"  (188 cm) Weight: 212 lb 8.4 oz (96.4 kg) IBW/kg (Calculated) : 82.2  Heparin Dosing Weight: 96.3kg  Vital Signs: Temp: 98.1 F (36.7 C) (12/28 0744) Temp src: Oral (12/28 0744) BP: 125/63 mmHg (12/28 0744) Pulse Rate: 69  (12/28 0744)  Labs:  Basename 11/29/12 0700 11/28/12 1950 11/28/12 1424 11/28/12 0610 11/27/12 2051 11/27/12 1301 11/27/12 0505  HGB 9.0* -- -- 9.0* -- -- --  HCT 27.1* -- -- 28.0* -- -- 27.6*  PLT 195 -- -- 203 -- -- 209  APTT -- -- -- -- -- -- --  LABPROT 20.7* -- -- -- 19.0* 20.6* --  INR 1.85* -- -- -- 1.65* 1.84* --  HEPARINUNFRC 0.21* 0.21* 0.30 -- -- -- --  CREATININE 2.32* -- -- 2.01* -- -- 1.99*  CKTOTAL -- -- -- -- -- -- --  CKMB -- -- -- -- -- -- --  TROPONINI -- -- -- -- -- -- --    Estimated Creatinine Clearance: 32 ml/min (by C-G formula based on Cr of 2.32).   Medications:  Heparin at 1650 units/hr  Assessment: 77yom continues on heparin for afib and PVD. Heparin level is slightly subtherapeutic. No issues with the infusion. No bleeding reported by the patient. Renal function is slightly improved from a few days ago. Plts remain wnl.   Goal of Therapy:  Heparin level 0.3-0.7 units/ml Monitor platelets by anticoagulation protocol: Yes   Plan:  1) Increase heparin to 1850 units/hr 2) Follow up 8 hour HL  Thank you,  Brett Fairy, PharmD, BCPS 11/29/2012 8:50 AM

## 2012-11-29 NOTE — Progress Notes (Signed)
[facility]  Subjective: Pain in left lower extremity better.  Objective: Vital signs in last 24 hours: Temp:  [97.7 F (36.5 C)-98.8 F (37.1 C)] 98.1 F (36.7 C) (12/28 0744) Pulse Rate:  [66-73] 69  (12/28 0744) Resp:  [22-34] 34  (12/28 0744) BP: (116-135)/(53-70) 125/63 mmHg (12/28 0744) SpO2:  [96 %-97 %] 96 % (12/28 0744) Weight:  [96.4 kg (212 lb 8.4 oz)] 96.4 kg (212 lb 8.4 oz) (12/28 0020) Last BM Date: 11/26/12  Intake/Output from previous day: 12/27 0701 - 12/28 0700 In: 973.7 [P.O.:360; I.V.:613.7] Out: 850 [Urine:850] Intake/Output this shift:    Medications Current Facility-Administered Medications  Medication Dose Route Frequency Provider Last Rate Last Dose  . 0.9 %  sodium chloride infusion   Intravenous Continuous Lennette Bihari, MD 50 mL/hr at 11/28/12 1322 1,000 mL at 11/28/12 1322  . acetaminophen (TYLENOL) tablet 650 mg  650 mg Oral Q4H PRN Abelino Derrick, PA      . acetaminophen (TYLENOL) tablet 650 mg  650 mg Oral Q4H PRN Runell Gess, MD      . allopurinol (ZYLOPRIM) tablet 100 mg  100 mg Oral Daily Eda Paschal [place], [place]   100 mg at 11/28/12 0945  . carvedilol (COREG) tablet 25 mg  25 mg Oral BID WC Eda Paschal [place], PA   25 mg at 11/29/12 4782  . heparin ADULT infusion 100 units/mL (25000 units/250 mL)  1,650 Units/hr Intravenous Continuous Fredrik Rigger, PHARMD 16.5 mL/hr at 11/28/12 2116 1,650 Units/hr at 11/28/12 2116  . hydrALAZINE (APRESOLINE) tablet 25 mg  25 mg Oral TID Abelino Derrick, PA   25 mg at 11/28/12 2115  . insulin aspart (novoLOG) injection 0-15 Units  0-15 Units Subcutaneous TID WC Eda Paschal [place], PA   3 Units at 11/29/12 0740  . insulin aspart (novoLOG) injection 0-5 Units  0-5 Units Subcutaneous QHS Eda Paschal [place], [place]   2 Units at 11/28/12 2120  . insulin glargine (LANTUS) injection 10 Units  10 Units Subcutaneous BID Nada Boozer, NP   10 Units at 11/28/12 2120  . isosorbide dinitrate (ISORDIL) tablet  20 mg  20 mg Oral TID Abelino Derrick, PA   20 mg at 11/28/12 2114  . levothyroxine (SYNTHROID, LEVOTHROID) tablet 50 mcg  50 mcg Oral QAC breakfast Eda Paschal [place], [place]   50 mcg at 11/29/12 9562  . morphine 2 MG/ML injection 2 mg  2 mg Intravenous Q1H PRN Runell Gess, MD   2 mg at 11/27/12 2045  . ondansetron (ZOFRAN) injection 4 mg  4 mg Intravenous Q6H PRN Abelino Derrick, PA   4 mg at 11/27/12 2045  . ondansetron (ZOFRAN) injection 4 mg  4 mg Intravenous Q6H PRN Runell Gess, MD      . oxyCODONE-acetaminophen (PERCOCET/ROXICET) 5-325 MG per tablet 1-2 tablet  1-2 tablet Oral Q4H PRN Abelino Derrick, PA   2 tablet at 11/28/12 1613  . pantoprazole (PROTONIX) EC tablet 40 mg  40 mg Oral Daily Eda Paschal [place], [place]   40 mg at 11/28/12 0947  . simvastatin (ZOCOR) tablet 40 mg  40 mg Oral QHS Eda Paschal [place], [place]   40 mg at 11/28/12 2114  . traMADol (ULTRAM) tablet 50 mg  50 mg Oral Q6H PRN Abelino Derrick, [place]      . Warfarin - Pharmacist Dosing Inpatient   Does not apply [id number] Runell Gess, MD      .  zolpidem (AMBIEN) tablet 5 mg  5 mg Oral QHS PRN Abelino Derrick, PA        PE: General appearance: alert, cooperative and no distress Lungs: clear to auscultation bilaterally Heart: regular rate and rhythm, S1, S2 normal, no murmur, click, rub or gallop Extremities: 1+ left LEE Pulses: 2+ left DP and PT.  Nonpalpable right pedal pulses.  Both LE are warm.  Skin: Right groin:  Nontender.  No hematoma or ecchymosis.  Lab Results:   Basename 11/29/12 0700 11/28/12 0610 11/27/12 0505  WBC 11.6* 11.8* 11.7*  HGB 9.0* 9.0* 9.1*  HCT 27.1* 28.0* 27.6*  PLT 195 203 209   BMET  Basename 11/29/12 0700 11/28/12 0610 11/27/12 0505  NA 139 137 134*  K 3.6 3.6 3.5  CL 102 99 96  CO2 24 26 25   GLUCOSE 153* 247* 291*  BUN 66* 61* 68*  CREATININE 2.32* 2.01* 1.99*  CALCIUM 9.1 9.3 9.1   PT/INR  Basename 11/29/12 0700 11/27/12 2051 11/27/12 1301  LABPROT 20.7* 19.0* 20.6*  INR 1.85* 1.65* 1.84*     Assessment/Plan  Principal Problem:  *Diabetic foot ulcer Active Problems:  Hx of CABG x 6 2003  ICD in place, MDT May 2011  PAF (paroxysmal atrial fibrillation)  Diabetes mellitus type 2, insulin dependent  CKD (chronic kidney disease) stage 4, GFR 15-29 ml/min  Positive Coombs test  Anemia due to chronic illness  HTN (hypertension)  Ischemic cardiomyopathy, EF 25% 2D 12/12  PVD, Lt popliteal HSRA 7/13  Dyslipidemia  Chronic anticoagulation  Non-sustained ventricular tachycardia  Sleep apnea, C-Pap intol  Gout  Hypothyroidism  Chronic systolic CHF (congestive heart failure)  S/P angioplasty, 11/27/12 -Lt below the knee popliteal artery  Plan:   6 Beat NSVT last night. Coreg at 25mg  BID.  Continue to monitor.  S/P PV angio successful angiosculpt intervention of the segmental restenosis of the knee below the knee popliteal in the setting of critical limb ischemia with an excellent result not requiring stenting.  Worsening SCr despite IV fluids, however, he only received yesterday.  INR 1.85.  Will continue to hydrate.  Recheck BMP in the AM.  Increase fluids to 75 ml /hr.  Hyperglycemia improved with increased Lantus.  His left LE wound is followed by Dr. Lajoyce Corners.    Transfer to telemetry.  Possible DC tomorrow.    LOS: 5 days    HAGER, BRYAN 11/29/2012 8:50 AM   Patient seen and examined. Agree with assessment and plan. No chest pain or SOB. Legs remain warm. Coumadin restarted. He has h/o PAF. Pt has stated that he does not want to see Dr. Lajoyce Corners. Increase hydration with increased Cr post contrast but has decreased LV function.  Transfer to telemetry today. Possible dc tomorrow.   Lennette Bihari, MD, [facility] 11/29/2012 9:32 AM

## 2012-11-30 LAB — BASIC METABOLIC PANEL
CO2: 22 mEq/L (ref 19–32)
Calcium: 8.9 mg/dL (ref 8.4–10.5)
Glucose, Bld: 171 mg/dL — ABNORMAL HIGH (ref 70–99)
Sodium: 140 mEq/L (ref 135–145)

## 2012-11-30 LAB — CBC
MCH: 27.6 pg (ref 26.0–34.0)
MCHC: 31.8 g/dL (ref 30.0–36.0)
MCV: 86.6 fL (ref 78.0–100.0)
Platelets: 186 10*3/uL (ref 150–400)
RBC: 2.83 MIL/uL — ABNORMAL LOW (ref 4.22–5.81)
RDW: 17.2 % — ABNORMAL HIGH (ref 11.5–15.5)

## 2012-11-30 LAB — GLUCOSE, CAPILLARY
Glucose-Capillary: 156 mg/dL — ABNORMAL HIGH (ref 70–99)
Glucose-Capillary: 264 mg/dL — ABNORMAL HIGH (ref 70–99)
Glucose-Capillary: 201 mg/dL — ABNORMAL HIGH (ref 70–99)

## 2012-11-30 MED ORDER — WARFARIN SODIUM 2.5 MG PO TABS
2.5000 mg | ORAL_TABLET | Freq: Once | ORAL | Status: AC
  Administered 2012-11-30: 2.5 mg via ORAL
  Filled 2012-11-30: qty 1

## 2012-11-30 NOTE — Progress Notes (Signed)
[facility]  Subjective: No complaints.  He gets a pain in his left foot every now and then.  Objective: Vital signs in last 24 hours: Temp:  [98.3 F (36.8 C)-99.3 F (37.4 C)] 99.1 F (37.3 C) (12/29 0434) Pulse Rate:  [65-70] 65  (12/29 0434) Resp:  [18-20] 19  (12/29 0434) BP: (119-139)/(67-69) 139/67 mmHg (12/29 0434) SpO2:  [93 %-95 %] 95 % (12/29 0434) Last BM Date: 11/26/12  Intake/Output from previous day: 12/28 0701 - 12/29 0700 In: 600 [P.O.:600] Out: 700 [Urine:700] Intake/Output this shift:    Medications Current Facility-Administered Medications  Medication Dose Route Frequency Provider Last Rate Last Dose  . 0.9 %  sodium chloride infusion   Intravenous Continuous Wilburt Finlay, PA 75 mL/hr at 11/30/12 0051 1,000 mL at 11/30/12 0051  . acetaminophen (TYLENOL) tablet 650 mg  650 mg Oral Q4H PRN Abelino Derrick, PA      . acetaminophen (TYLENOL) tablet 650 mg  650 mg Oral Q4H PRN Runell Gess, MD      . allopurinol (ZYLOPRIM) tablet 100 mg  100 mg Oral Daily Eda Paschal [place], [place]   100 mg at 11/29/12 0945  . carvedilol (COREG) tablet 25 mg  25 mg Oral BID WC Eda Paschal [place], PA   25 mg at 11/30/12 0645  . heparin ADULT infusion 100 units/mL (25000 units/250 mL)  1,850 Units/hr Intravenous Continuous Brett Fairy, PHARMD 18.5 mL/hr at 11/29/12 0930 1,850 Units/hr at 11/29/12 0930  . hydrALAZINE (APRESOLINE) tablet 25 mg  25 mg Oral TID Abelino Derrick, PA   25 mg at 11/30/12 0048  . insulin aspart (novoLOG) injection 0-15 Units  0-15 Units Subcutaneous TID [facility] Eda Paschal [place], [place]   3 Units at 11/30/12 [phone]  . insulin aspart (novoLOG) injection 0-5 Units  0-5 Units Subcutaneous QHS Eda Paschal [place], [place]   2 Units at 11/28/12 2120  . insulin glargine (LANTUS) injection 10 Units  10 Units Subcutaneous BID Nada Boozer, NP   10 Units at 11/30/12 0055  . isosorbide dinitrate (ISORDIL) tablet 20 mg  20 mg Oral TID Abelino Derrick, PA   20 mg at 11/30/12 0049    . levothyroxine (SYNTHROID, LEVOTHROID) tablet 50 mcg  50 mcg Oral QAC breakfast Eda Paschal [place], [place]   50 mcg at 11/30/12 0645  . morphine 2 MG/ML injection 2 mg  2 mg Intravenous Q1H PRN Runell Gess, MD   2 mg at 11/27/12 2045  . ondansetron (ZOFRAN) injection 4 mg  4 mg Intravenous Q6H PRN Abelino Derrick, PA   4 mg at 11/27/12 2045  . ondansetron (ZOFRAN) injection 4 mg  4 mg Intravenous Q6H PRN Runell Gess, MD      . oxyCODONE-acetaminophen (PERCOCET/ROXICET) 5-325 MG per tablet 1-2 tablet  1-2 tablet Oral Q4H PRN Abelino Derrick, PA   2 tablet at 11/28/12 1613  . pantoprazole (PROTONIX) EC tablet 40 mg  40 mg Oral Daily Eda Paschal [place], [place]   40 mg at 11/29/12 0948  . simvastatin (ZOCOR) tablet 40 mg  40 mg Oral QHS Eda Paschal [place], [place]   40 mg at 11/30/12 0048  . traMADol (ULTRAM) tablet 50 mg  50 mg Oral Q6H PRN Abelino Derrick, PA      . warfarin (COUMADIN) tablet 5 mg  5 mg Oral ONCE-1800 Runell Gess, MD      . Warfarin - Pharmacist Dosing Inpatient   Does not  apply q1800 Runell Gess, MD      . zolpidem Remus Loffler) tablet 5 mg  5 mg Oral QHS PRN Abelino Derrick, PA        PE: General appearance: alert, cooperative and no distress  Lungs: clear to auscultation bilaterally  Heart: regular rate and rhythm, S1, S2 normal, no murmur, click, rub or gallop  Extremities: 1+ left LEE  Pulses: 2+ left DP and PT. Nonpalpable right pedal pulses. Both LE are warm.  Skin: Warm and dry.   Lab Results:   Basename 11/30/12 0507 11/29/12 0700 11/28/12 0610  WBC 8.8 11.6* 11.8*  HGB 7.8* 9.0* 9.0*  HCT 24.5* 27.1* 28.0*  PLT 186 195 203   BMET  Basename 11/30/12 0507 11/29/12 0700 11/28/12 0610  NA 140 139 137  K 3.7 3.6 3.6  CL 105 102 99  CO2 22 24 26   GLUCOSE 171* 153* 247*  BUN 68* 66* 61*  CREATININE 2.24* 2.32* 2.01*  CALCIUM 8.9 9.1 9.3   PT/INR  Basename 11/30/12 0507 11/29/12 0700 11/27/12 2051  LABPROT 22.2* 20.7* 19.0*  INR 2.04* 1.85* 1.65*   Cholesterol No  results found for this basename: CHOL in the last 72 hours Cardiac Enzymes No components found with this basename: TROPONIN:3, CKMB:3  Studies/Results: @RISRSLT2 @   Assessment/Plan  Principal Problem:  *Diabetic foot ulcer Active Problems:  Hx of CABG 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  Coreg at 25mg  BID. Continue to monitor. S/P PV angio successful angiosculpt intervention of the segmental restenosis of the knee below the knee popliteal in the setting of critical limb ischemia with an excellent result not requiring stenting. Mildly improved SCr with IV fluids at 13ml/hr for the last 24 hours.   INR 2.04.  Hyperglycemia improved with increased Lantus. Hgb has dropped to 7.8 from 9.0.  Likely dilutional.  Possible DC but with Hgb where it is I am inclined to watch one more day.  KVO fluids.        LOS: 6 days    HAGER, BRYAN 11/30/2012 10:27 AM   Patient seen and examined. Agree with assessment and plan. Will continue hydration today. Check stool since back on coumadin with decrease in Hb.Anticipate dc tomorrow.  Lennette Bihari, MD, [facility] 11/30/2012 11:04 AM

## 2012-11-30 NOTE — Progress Notes (Signed)
ANTICOAGULATION CONSULT NOTE - Follow Up Consult  Pharmacy Consult for Heparin/coumadin Indication: atrial fibrillation and PVD  No Known Allergies  Patient Measurements: Height: 6\' 2"  (188 cm) Weight: 212 lb 8.4 oz (96.4 kg) IBW/kg (Calculated) : 82.2  Heparin Dosing Weight: 96.3kg  Vital Signs: Temp: 99.1 F (37.3 C) (12/29 0434) Temp src: Oral (12/29 0434) BP: 139/67 mmHg (12/29 0434) Pulse Rate: 65  (12/29 0434)  Labs:  Basename 11/30/12 0507 11/29/12 1601 11/29/12 0700 11/28/12 0610 11/27/12 2051  HGB 7.8* -- 9.0* -- --  HCT 24.5* -- 27.1* 28.0* --  PLT 186 -- 195 203 --  APTT -- -- -- -- --  LABPROT 22.2* -- 20.7* -- 19.0*  INR 2.04* -- 1.85* -- 1.65*  HEPARINUNFRC 0.34 0.35 0.21* -- --  CREATININE 2.24* -- 2.32* 2.01* --  CKTOTAL -- -- -- -- --  CKMB -- -- -- -- --  TROPONINI -- -- -- -- --    Estimated Creatinine Clearance: 33.1 ml/min (by C-G formula based on Cr of 2.24).   Medications:  Heparin at 1650 units/hr  Assessment: 77yom continues on heparin bridge to couamdin for afib and PVD. Heparin level is therapeutic. INR 2.04. No bleeding reported although Hg has dropped to 7.8  Coumadin PTA dose: 5mg  every day except on Tuesdays and Fridays, take 2.5mg   Goal of Therapy:  Heparin level 0.3-0.7 units/ml Monitor platelets by anticoagulation protocol: Yes   Plan:  1) Continue heparin infusion 1850 units/hr, consider dc as INR >2 2) Coumadin 2.5mg  po x 1 tonight 3) f/u AM PT/INR, heparin level and cbc   Thank you,  Talbert Cage, PharmD Clinical Pharmacist  Pager: [phone]   11/30/2012 11:32 AM

## 2012-12-01 LAB — GLUCOSE, CAPILLARY: Glucose-Capillary: 183 mg/dL — ABNORMAL HIGH (ref 70–99)

## 2012-12-01 LAB — BASIC METABOLIC PANEL
BUN: 59 mg/dL — ABNORMAL HIGH (ref 6–23)
Calcium: 9.2 mg/dL (ref 8.4–10.5)
Creatinine, Ser: 2.22 mg/dL — ABNORMAL HIGH (ref 0.50–1.35)
GFR calc non Af Amer: 27 mL/min — ABNORMAL LOW (ref >90)
Glucose, Bld: 139 mg/dL — ABNORMAL HIGH (ref 70–99)
Sodium: 139 mEq/L (ref 135–145)

## 2012-12-01 LAB — CBC
MCH: 28 pg (ref 26.0–34.0)
MCHC: 32.3 g/dL (ref 30.0–36.0)
Platelets: 210 10*3/uL (ref 150–400)
RBC: 3.07 MIL/uL — ABNORMAL LOW (ref 4.22–5.81)

## 2012-12-01 LAB — HEPARIN LEVEL (UNFRACTIONATED): Heparin Unfractionated: 0.33 IU/mL (ref 0.30–0.70)

## 2012-12-01 MED ORDER — WARFARIN SODIUM 5 MG PO TABS
5.0000 mg | ORAL_TABLET | Freq: Once | ORAL | Status: DC
  Filled 2012-12-01: qty 1

## 2012-12-01 MED ORDER — POTASSIUM CHLORIDE CRYS ER 20 MEQ PO TBCR
40.0000 meq | EXTENDED_RELEASE_TABLET | Freq: Once | ORAL | Status: AC
  Administered 2012-12-01: 40 meq via ORAL
  Filled 2012-12-01: qty 1

## 2012-12-01 NOTE — Consult Note (Signed)
Reason for Consult: nonhealing ulcers of left ankle and heel Referring Physician: Bradford Cazier is an 77 y.o. male.  HPI: Pt with chronic ulcers of left heel and ankle, which he reports have been present since August.  Followed by Dr Lajoyce Corners for wound care.  Noted that wounds not responding and sent to Dr Allyson Sabal for evaluation.  Left popliteal artery angioplasty done 12/26.  Pt seen by Infectious Disease physicians who recommend amputation. Pts angioplasty considered successful and now needs further wound care.  He wishes to have someone other than Dr Lajoyce Corners.  Dr Ophelia Charter on call and case discussed with him.  He will see pt later tonight and discuss need for surgical intervention in the form of left below knee amputation.  Past Medical History  Diagnosis Date  . HTN (hypertension)   . PVD (peripheral vascular disease)   . COPD (chronic obstructive pulmonary disease)   . Diabetes mellitus     IDDM  . S/P CABG x 6 2003    Dr Cornelius Moras, Myoview low risk 2009  . Gout   . Paroxysmal atrial fibrillation   . Hypothyroidism   . ICD (implantable cardiac defibrillator) discharge 5/11    MDT  . Chronic renal insufficiency, stage III (moderate)   . Chronic systolic CHF (congestive heart failure)     EF 25-30% echo 12/12  . S/P angioplasty, 11/27/12 -Lt below the knee popliteal artery 11/28/2012    Past Surgical History  Procedure Date  . Coronary artery bypass graft   . Cardiac catheterization     History reviewed. No pertinent family history.  Social History:  reports that he quit smoking about 9 years ago. He has quit using smokeless tobacco. He reports that he does not drink alcohol or use illicit drugs.  Allergies: No Known Allergies  Medications: I have reviewed the patient's current medications.  Results for orders placed during the hospital encounter of 11/24/12 (from the past 48 hour(s))  GLUCOSE, CAPILLARY     Status: Abnormal   Collection Time   11/29/12  9:32 PM      Component  Value Range Comment   Glucose-Capillary 208 (*) 70 - 99 mg/dL    Comment 1 Documented in Chart      Comment 2 Notify RN     GLUCOSE, CAPILLARY     Status: Abnormal   Collection Time   11/30/12 12:44 AM      Component Value Range Comment   Glucose-Capillary 188 (*) 70 - 99 mg/dL   CBC     Status: Abnormal   Collection Time   11/30/12  5:07 AM      Component Value Range Comment   WBC 8.8  4.0 - 10.5 K/uL    RBC 2.83 (*) 4.22 - 5.81 MIL/uL    Hemoglobin 7.8 (*) 13.0 - 17.0 g/dL    HCT 57.8 (*) 46.9 - 52.0 %    MCV 86.6  78.0 - 100.0 fL    MCH 27.6  26.0 - 34.0 pg    MCHC 31.8  30.0 - 36.0 g/dL    RDW 62.9 (*) 52.8 - 15.5 %    Platelets 186  150 - 400 K/uL   HEPARIN LEVEL (UNFRACTIONATED)     Status: Normal   Collection Time   11/30/12  5:07 AM      Component Value Range Comment   Heparin Unfractionated 0.34  0.30 - 0.70 IU/mL   PROTIME-INR     Status: Abnormal   Collection Time  11/30/12  5:07 AM      Component Value Range Comment   Prothrombin Time 22.2 (*) 11.6 - 15.2 seconds    INR 2.04 (*) 0.00 - 1.49   BASIC METABOLIC PANEL     Status: Abnormal   Collection Time   11/30/12  5:07 AM      Component Value Range Comment   Sodium 140  135 - 145 mEq/L    Potassium 3.7  3.5 - 5.1 mEq/L    Chloride 105  96 - 112 mEq/L    CO2 22  19 - 32 mEq/L    Glucose, Bld 171 (*) 70 - 99 mg/dL    BUN 68 (*) 6 - 23 mg/dL    Creatinine, Ser 1.61 (*) 0.50 - 1.35 mg/dL    Calcium 8.9  8.4 - 09.6 mg/dL    GFR calc non Af Amer 27 (*) >90 mL/min    GFR calc Af Amer 31 (*) >90 mL/min   GLUCOSE, CAPILLARY     Status: Abnormal   Collection Time   11/30/12  6:33 AM      Component Value Range Comment   Glucose-Capillary 156 (*) 70 - 99 mg/dL   GLUCOSE, CAPILLARY     Status: Abnormal   Collection Time   11/30/12 11:44 AM      Component Value Range Comment   Glucose-Capillary 264 (*) 70 - 99 mg/dL   GLUCOSE, CAPILLARY     Status: Abnormal   Collection Time   11/30/12  4:24 PM       Component Value Range Comment   Glucose-Capillary 206 (*) 70 - 99 mg/dL   GLUCOSE, CAPILLARY     Status: Abnormal   Collection Time   11/30/12  9:02 PM      Component Value Range Comment   Glucose-Capillary 201 (*) 70 - 99 mg/dL    Comment 1 Documented in Chart      Comment 2 Notify RN     CBC     Status: Abnormal   Collection Time   12/01/12  5:26 AM      Component Value Range Comment   WBC 8.9  4.0 - 10.5 K/uL    RBC 3.07 (*) 4.22 - 5.81 MIL/uL    Hemoglobin 8.6 (*) 13.0 - 17.0 g/dL    HCT 04.5 (*) 40.9 - 52.0 %    MCV 86.6  78.0 - 100.0 fL    MCH 28.0  26.0 - 34.0 pg    MCHC 32.3  30.0 - 36.0 g/dL    RDW 81.1 (*) 91.4 - 15.5 %    Platelets 210  150 - 400 K/uL   HEPARIN LEVEL (UNFRACTIONATED)     Status: Normal   Collection Time   12/01/12  5:26 AM      Component Value Range Comment   Heparin Unfractionated 0.33  0.30 - 0.70 IU/mL   PROTIME-INR     Status: Abnormal   Collection Time   12/01/12  5:26 AM      Component Value Range Comment   Prothrombin Time 23.6 (*) 11.6 - 15.2 seconds    INR 2.21 (*) 0.00 - 1.49   BASIC METABOLIC PANEL     Status: Abnormal   Collection Time   12/01/12  5:26 AM      Component Value Range Comment   Sodium 139  135 - 145 mEq/L    Potassium 3.4 (*) 3.5 - 5.1 mEq/L    Chloride 104  96 - 112 mEq/L  CO2 22  19 - 32 mEq/L    Glucose, Bld 139 (*) 70 - 99 mg/dL    BUN 59 (*) 6 - 23 mg/dL    Creatinine, Ser 1.61 (*) 0.50 - 1.35 mg/dL    Calcium 9.2  8.4 - 09.6 mg/dL    GFR calc non Af Amer 27 (*) >90 mL/min    GFR calc Af Amer 32 (*) >90 mL/min   GLUCOSE, CAPILLARY     Status: Abnormal   Collection Time   12/01/12  6:01 AM      Component Value Range Comment   Glucose-Capillary 141 (*) 70 - 99 mg/dL    Comment 1 Documented in Chart      Comment 2 Notify RN     GLUCOSE, CAPILLARY     Status: Abnormal   Collection Time   12/01/12 11:21 AM      Component Value Range Comment   Glucose-Capillary 183 (*) 70 - 99 mg/dL    Comment 1 Notify  RN      Comment 2 Documented in Chart     GLUCOSE, CAPILLARY     Status: Abnormal   Collection Time   12/01/12  4:25 PM      Component Value Range Comment   Glucose-Capillary 262 (*) 70 - 99 mg/dL    Comment 1 Documented in Chart      Comment 2 Notify RN       No results found.  ROS Blood pressure 125/59, pulse 60, temperature 97.5 F (36.4 C), temperature source Oral, resp. rate 18, height 6\' 2"  (1.88 m), weight 96.4 kg (212 lb 8.4 oz), SpO2 100.00%. Physical Exam Left lower extremity examined and found to have moderate edema distally.  Anterior leg with chronic skin changes.  Anterior ankle with exposed tendon.  Some granulation tissue with exudate and some purulence.  Posterior heel with large dark, hard eschar with underlying fluctuance.  Currently no active drainage as skin is hard and dry here.  Wound redressed.  Assessment/Plan: Nonhealing diabetic ulcers of left anterior ankle and posterior left heel.  Recent successful left popliteal artery angioplasty.   PLAN: Pt will need BKA. Currently the pt is anticoagulated.  Will hold coumadin.  Recheck INR in am. Will need to hold anticoag until acceptable for surgery.  He will discuss amputation with pt later this evening.    VERNON,SHEILA M 12/01/2012, 5:05 PM     Discussed with patient options with BKA  As the recommended procedure with diabetes, venous insufficiency , nonhealing ulcer with expoted anterior tibial tendon and PAD.   He can consider this and will discuss with him more tomorrow.   Possible surgery Friday AM if he agrees.

## 2012-12-01 NOTE — Progress Notes (Signed)
Subjective: Complains of pain from knee to foot on left.  No dressing changes recently.  Objective: Vital signs in last 24 hours: Temp:  [97.5 F (36.4 C)-99 F (37.2 C)] 98.9 F (37.2 C) (12/30 0425) Pulse Rate:  [56-64] 64  (12/30 0425) Resp:  [18-19] 19  (12/30 0425) BP: (127-151)/(59-71) 130/59 mmHg (12/30 0425) SpO2:  [95 %-97 %] 95 % (12/30 0425) Weight change:  Last BM Date: 11/26/12 Intake/Output from previous day: -510 (total -3853) 12/29 0701 - 12/30 0700 In: 240 [P.O.:240] Out: 750 [Urine:750] Intake/Output this shift:    PE: General:alert and oriented Heart:S1S2 irreg irreg Lungs:clear without rales or rhonchi Abd:+ BS soft, non tender ZOX:[id number] removed, blackened ulcer on heel with loss of tissue, and wet malodorous foul-smelling purulent material and on anterior portion wet malodorous ulcer as well     Lab Results:  Basename 12/01/12 0526 11/30/12 0507  WBC 8.9 8.8  HGB 8.6* 7.8*  HCT 26.6* 24.5*  PLT 210 186   BMET  Basename 12/01/12 0526 11/30/12 0507  NA 139 140  K 3.4* 3.7  CL 104 105  CO2 22 22  GLUCOSE 139* 171*  BUN 59* 68*  CREATININE 2.22* 2.24*  CALCIUM 9.2 8.9   No results found for this basename: TROPONINI:2,CK,MB:2 in the last 72 hours  Lab Results  Component Value Date   CHOL  Value: 115        ATP III CLASSIFICATION:  <200     mg/dL   Desirable  981-191  mg/dL   Borderline High  >=478    mg/dL   High 29/04/6212   HDL 25* 11/07/2007   LDLCALC  Value: 50        Total Cholesterol/HDL:CHD Risk Coronary Heart Disease Risk Table                     Men   Women  1/2 Average Risk   3.4   3.3 11/07/2007   TRIG 198* 11/07/2007   CHOLHDL 4.6 11/07/2007   Lab Results  Component Value Date   HGBA1C 8.4* 11/24/2012     Lab Results  Component Value Date   TSH 1.293 11/24/2012      Studies/Results: No results found.  Medications: I have reviewed the patient's current medications.    Marland Kitchen allopurinol  100 mg Oral Daily  . carvedilol   25 mg Oral BID WC  . hydrALAZINE  25 mg Oral TID  . insulin aspart  0-15 Units Subcutaneous TID WC  . insulin aspart  0-5 Units Subcutaneous QHS  . insulin glargine  10 Units Subcutaneous BID  . isosorbide dinitrate  20 mg Oral TID  . levothyroxine  50 mcg Oral QAC breakfast  . pantoprazole  40 mg Oral Daily  . simvastatin  40 mg Oral QHS  . Warfarin - Pharmacist Dosing Inpatient   Does not apply q1800   Assessment/Plan: Principal Problem:  *Diabetic foot ulcer Active Problems:  CKD (chronic kidney disease) stage 4, GFR 15-29 ml/min  PVD, Lt popliteal HSRA 7/13  Non-sustained ventricular tachycardia  S/P angioplasty, 11/27/12 -Lt below the knee popliteal artery  ICD in place, MDT May 2011  Diabetes mellitus type 2, insulin dependent  Ischemic cardiomyopathy, EF 25% 2D 12/12  Chronic anticoagulation  Chronic systolic CHF (congestive heart failure)  Hx of CABG x 6 2003  PAF (paroxysmal atrial fibrillation)  Sleep apnea, C-Pap intol  Gout  Hypothyroidism  Positive Coombs test  Anemia due to chronic illness  HTN (  hypertension)  Dyslipidemia  PLAN: wound care was not comfortable in caring for wound recommended surgical or ortho consult,  ? Today before discharge? INR 2.21 on coumadin. Can pt ambulate?  Cr. Stable. K+ low. Anemia continues. Will ask PT to evaluate, pt has not been up recently, uses a walker at home.  ID saw the pt 11/24/12 and did not feel antibiotics needed at that time.   ? Ortho consult with ortho? Other than Dr. Lajoyce Corners per pt's request-prior to discharge?  Will d/c Heparin.  Repeat wound culture sent.  LOS: 7 days   INGOLD,LAURA R 12/01/2012, 8:06 AM   I have seen and examined the patient along with [facility] R NP.  I have reviewed the chart, notes and new data.  I agree with NP's note.  Considerable malodorous discharge from the complex infected foot wound, soaking the dressing. It is becoming more apparent that he will likely not escape an amputation.  Hopefully the recent revascularization procedure will allow a lower level of amputation and better healing. Would ask for re-evaluation by Dr. Lajoyce Corners before we make plans regarding anticoagulation and discharge.   Thurmon Fair, MD, [facility] [phone] 12/01/2012, 2:33 PM

## 2012-12-01 NOTE — Progress Notes (Signed)
ANTICOAGULATION CONSULT NOTE - Follow Up Consult  Pharmacy Consult for Heparin/coumadin Indication: atrial fibrillation and PVD  No Known Allergies  Patient Measurements: Height: 6\' 2"  (188 cm) Weight: 212 lb 8.4 oz (96.4 kg) IBW/kg (Calculated) : 82.2  Heparin Dosing Weight: 96.3kg  Vital Signs: Temp: 98.9 F (37.2 C) (12/30 0425) Temp src: Oral (12/30 0425) BP: 130/59 mmHg (12/30 0425) Pulse Rate: 64  (12/30 0425)  Labs:  Basename 12/01/12 0526 11/30/12 0507 11/29/12 1601 11/29/12 0700  HGB 8.6* 7.8* -- --  HCT 26.6* 24.5* -- 27.1*  PLT 210 186 -- 195  APTT -- -- -- --  LABPROT 23.6* 22.2* -- 20.7*  INR 2.21* 2.04* -- 1.85*  HEPARINUNFRC 0.33 0.34 0.35 --  CREATININE 2.22* 2.24* -- 2.32*  CKTOTAL -- -- -- --  CKMB -- -- -- --  TROPONINI -- -- -- --    Estimated Creatinine Clearance: 33.4 ml/min (by C-G formula based on Cr of 2.22).   Assessment: 77yom continues on coumadin  (heparin d/c today) for afib and PVD. INR 2.21 and Hg= 8.6. INR was at goal on admit.  Coumadin PTA dose: 5mg  every day except on Tuesdays and Fridays, take 2.5mg   Goal of Therapy:  Heparin level 0.3-0.7 units/ml Monitor platelets by anticoagulation protocol: Yes   Plan:  -Coumadin 5mg  po today -Daily PT/INR  Harland German, Pharm D 12/01/2012 9:17 AM

## 2012-12-02 LAB — BASIC METABOLIC PANEL
BUN: 53 mg/dL — ABNORMAL HIGH (ref 6–23)
Chloride: 106 mEq/L (ref 96–112)
Creatinine, Ser: 2.02 mg/dL — ABNORMAL HIGH (ref 0.50–1.35)
GFR calc Af Amer: 35 mL/min — ABNORMAL LOW (ref >90)
Glucose, Bld: 138 mg/dL — ABNORMAL HIGH (ref 70–99)
Potassium: 3.6 mEq/L (ref 3.5–5.1)

## 2012-12-02 LAB — GLUCOSE, CAPILLARY
Glucose-Capillary: 169 mg/dL — ABNORMAL HIGH (ref 70–99)
Glucose-Capillary: 184 mg/dL — ABNORMAL HIGH (ref 70–99)
Glucose-Capillary: 176 mg/dL — ABNORMAL HIGH (ref 70–99)

## 2012-12-02 LAB — CBC
HCT: 25.9 % — ABNORMAL LOW (ref 39.0–52.0)
Hemoglobin: 8.4 g/dL — ABNORMAL LOW (ref 13.0–17.0)
MCH: 28 pg (ref 26.0–34.0)
MCHC: 32.4 g/dL (ref 30.0–36.0)
RDW: 17.3 % — ABNORMAL HIGH (ref 11.5–15.5)

## 2012-12-02 LAB — PROTIME-INR: Prothrombin Time: 23.6 seconds — ABNORMAL HIGH (ref 11.6–15.2)

## 2012-12-02 NOTE — Evaluation (Signed)
Physical Therapy Evaluation Patient Details Name: [name] MRN: [id number] DOB: October 07, 1935 Today's Date: 12/02/2012 Time: 1308-6578 PT Time Calculation (min): 35 min  PT Assessment / Plan / Recommendation Clinical Impression  pt admitted with infected L foot diabetic foot ulcers.  Pt has been urged to consider L LE amputation.  May proceed Friday.  Pt can benefit from PT no matter his decision to maximize independence.    PT Assessment  Patient needs continued PT services    Follow Up Recommendations  CIR (if proceeds to amputation will need pre prosthetic training)    Does the patient have the potential to tolerate intense rehabilitation      Barriers to Discharge Decreased caregiver support      Equipment Recommendations  Other (comment);None recommended by PT (TBD based on surgical decision)    Recommendations for Other Services     Frequency Min 3X/week    Precautions / Restrictions Precautions Precautions: Fall Restrictions Weight Bearing Restrictions: No   Pertinent Vitals/Pain Painful, but not rated       Mobility  Bed Mobility Bed Mobility: Supine to Sit;Sitting - Scoot to Edge of Bed;Sit to Supine Supine to Sit: 7: Independent Sitting - Scoot to Edge of Bed: 7: Independent Sit to Supine: 7: Independent Transfers Transfers: Sit to Stand;Stand to Sit Sit to Stand: 5: Supervision;With upper extremity assist;From bed Stand to Sit: 5: Supervision;With upper extremity assist;To bed Details for Transfer Assistance: safe technique Ambulation/Gait Ambulation/Gait Assistance: 4: Min guard;5: Supervision Ambulation Distance (Feet): 125 Feet Assistive device: Small based quad cane Ambulation/Gait Assistance Details: mildly unsteady throughout, but more so initially with some smoothing of gait as he progressed Gait Pattern: Step-through pattern;Decreased stride length;Antalgic Stairs: No Wheelchair Mobility Wheelchair Mobility: No    Shoulder  Instructions     Exercises     PT Diagnosis: Generalized weakness;Acute pain;Difficulty walking  PT Problem List: Decreased strength;Decreased activity tolerance;Decreased balance;Decreased knowledge of use of DME PT Treatment Interventions: DME instruction;Gait training;Functional mobility training;Therapeutic activities;Balance training;Patient/family education   PT Goals Acute Rehab PT Goals Time For Goal Achievement: 12/16/12 Potential to Achieve Goals: Good Pt will go Sit to Stand: Independently;with modified independence PT Goal: Sit to Stand - Progress: Goal set today Pt will Transfer Bed to Chair/Chair to Bed: with modified independence PT Transfer Goal: Bed to Chair/Chair to Bed - Progress: Goal set today Pt will Ambulate: 51 - 150 feet;with supervision;with least restrictive assistive device PT Goal: Ambulate - Progress: Goal set today Pt will Go Up / Down Stairs: 3-5 stairs;with supervision;with rail(s) PT Goal: Up/Down Stairs - Progress: Goal set today  Visit Information  Last PT Received On: 12/02/12 Assistance Needed: +1    Subjective Data  Subjective: They didn't give me much choice on what to do next Patient Stated Goal: Independent   Prior Functioning  Home Living Lives With: Alone Available Help at Discharge: Family;Available PRN/intermittently (daughter and grand daughter work days) Type of Home: House Home Access: Stairs to enter Secretary/administrator of Steps: 9 Entrance Stairs-Rails: Can reach both Home Layout: One level Bathroom Shower/Tub: Tub/shower unit;Curtain Firefighter: Handicapped height Home Adaptive Equipment: Therapist, sports - rolling Prior Function Level of Independence: Independent with assistive device(s) Able to Take Stairs?: Yes Driving: Yes Vocation: Retired Musician: No difficulties Dominant Hand: Right    Cognition  Overall Cognitive Status: Appears within functional limits for tasks  assessed/performed Arousal/Alertness: Awake/alert Behavior During Session: [facility] for tasks performed    Extremity/Trunk Assessment Right Lower Extremity Assessment RLE ROM/Strength/Tone:  Deficits;WFL for tasks assessed RLE ROM/Strength/Tone Deficits: bil grossly 4/5 Left Lower Extremity Assessment LLE ROM/Strength/Tone: WFL for tasks assessed   Balance Balance Balance Assessed: No  End of Session PT - End of Session Activity Tolerance: Patient tolerated treatment well;Patient limited by pain Patient left: in bed;with call bell/phone within reach Nurse Communication: Mobility status  GP     Toyna Erisman, Eliseo Gum 12/02/2012, 11:05 AM  12/02/2012  Tatum Bing, PT [phone] (pager)

## 2012-12-02 NOTE — Progress Notes (Signed)
ANTICOAGULATION CONSULT NOTE - Follow Up Consult  Pharmacy Consult for coumadin Indication: atrial fibrillation and PVD  No Known Allergies  Patient Measurements: Height: 6\' 2"  (188 cm) Weight: 212 lb 8.4 oz (96.4 kg) IBW/kg (Calculated) : 82.2  Heparin Dosing Weight: 96.3kg  Vital Signs: Temp: 97.6 F (36.4 C) (12/31 0613) Temp src: Oral (12/31 0613) BP: 141/54 mmHg (12/31 0613) Pulse Rate: 54  (12/31 0613)  Labs:  Basename 12/02/12 0440 12/01/12 0526 11/30/12 0507  HGB 8.4* 8.6* --  HCT 25.9* 26.6* 24.5*  PLT 207 210 186  APTT -- -- --  LABPROT 23.6* 23.6* 22.2*  INR 2.21* 2.21* 2.04*  HEPARINUNFRC <0.10* 0.33 0.34  CREATININE 2.02* 2.22* 2.24*  CKTOTAL -- -- --  CKMB -- -- --  TROPONINI -- -- --    Estimated Creatinine Clearance: 36.7 ml/min (by C-G formula based on Cr of 2.02).   Assessment: 77yom continues on coumadin  (heparin d/c12/30/13) for afib and PVD. INR 2.21 and Hg= 8.4. Patient noted for possible BKA on Friday and coumadin now on hold per Ortho.  Coumadin PTA dose: 5mg  every day except on Tuesdays and Fridays, take 2.5mg   Goal of Therapy:  Heparin level 0.3-0.7 units/ml Monitor platelets by anticoagulation protocol: Yes   Plan:  -Hold coumadin for possible BKA -Consider resuming heparin if INR < 2.0?  Harland German, Pharm D 12/02/2012 9:55 AM

## 2012-12-02 NOTE — Progress Notes (Signed)
[facility]  Subjective:   Objective: Vital signs in last 24 hours: Temp:  [97.6 F (36.4 C)-98.3 F (36.8 C)] 98.2 F (36.8 C) (12/31 1415) Pulse Rate:  [54-62] 62  (12/31 1415) Resp:  [18] 18  (12/31 1415) BP: (91-153)/(54-75) 91/75 mmHg (12/31 1415) SpO2:  [98 %] 98 % (12/31 1415) Last BM Date: 11/26/12  Intake/Output from previous day: 12/30 0701 - 12/31 0700 In: 480 [P.O.:480] Out: 1200 [Urine:1200] Intake/Output this shift: Total I/O In: 480 [P.O.:480] Out: 200 [Urine:200]  Medications Current Facility-Administered Medications  Medication Dose Route Frequency Provider Last Rate Last Dose  . acetaminophen (TYLENOL) tablet 650 mg  650 mg Oral Q4H PRN Abelino Derrick, PA      . acetaminophen (TYLENOL) tablet 650 mg  650 mg Oral Q4H PRN Runell Gess, MD      . allopurinol (ZYLOPRIM) tablet 100 mg  100 mg Oral Daily Eda Paschal [place], [place]   100 mg at 12/02/12 1030  . carvedilol (COREG) tablet 25 mg  25 mg Oral BID WC Abelino Derrick, PA   25 mg at 12/02/12 1000  . hydrALAZINE (APRESOLINE) tablet 25 mg  25 mg Oral TID Abelino Derrick, PA   25 mg at 12/02/12 1030  . insulin aspart (novoLOG) injection 0-15 Units  0-15 Units Subcutaneous TID WC Eda Paschal [place], PA   3 Units at 12/02/12 1205  . insulin aspart (novoLOG) injection 0-5 Units  0-5 Units Subcutaneous QHS Eda Paschal [place], [place]   2 Units at 11/30/12 2203  . insulin glargine (LANTUS) injection 10 Units  10 Units Subcutaneous BID Nada Boozer, NP      . isosorbide dinitrate (ISORDIL) tablet 20 mg  20 mg Oral TID Abelino Derrick, PA   20 mg at 12/02/12 1030  . levothyroxine (SYNTHROID, LEVOTHROID) tablet 50 mcg  50 mcg Oral QAC breakfast Eda Paschal [place], [place]   50 mcg at 12/02/12 1610  . morphine 2 MG/ML injection 2 mg  2 mg Intravenous Q1H PRN Runell Gess, MD   2 mg at 11/27/12 2045  . ondansetron (ZOFRAN) injection 4 mg  4 mg Intravenous Q6H PRN Abelino Derrick, PA   4 mg at 11/27/12 2045  . ondansetron  (ZOFRAN) injection 4 mg  4 mg Intravenous Q6H PRN Runell Gess, MD      . oxyCODONE-acetaminophen (PERCOCET/ROXICET) 5-325 MG per tablet 1-2 tablet  1-2 tablet Oral Q4H PRN Abelino Derrick, PA   2 tablet at 12/02/12 0054  . pantoprazole (PROTONIX) EC tablet 40 mg  40 mg Oral Daily Eda Paschal [place], [place]   40 mg at 12/01/12 1118  . simvastatin (ZOCOR) tablet 40 mg  40 mg Oral QHS Eda Paschal [place], [place]   40 mg at 12/01/12 2131  . traMADol (ULTRAM) tablet 50 mg  50 mg Oral Q6H PRN Abelino Derrick, [place]      . Warfarin - Pharmacist Dosing Inpatient   Does not apply q1800 Runell Gess, MD      . zolpidem [facility]) tablet 5 mg  5 mg Oral QHS PRN Abelino Derrick, PA        PE: General appearance: alert, cooperative, appears stated age and no distress Neck: no adenopathy, no carotid bruit and no JVD Lungs: clear to auscultation bilaterally, normal percussion bilaterally and non-labored Heart: irregularly irregular rhythm and S1, S2 normal Abdomen: soft, non-tender; bowel sounds normal; no masses,  no organomegaly Extremities: Dressing on  LLE intact.  Able to walk.  RLE - heel if soft & malleable along calcaneal border, but on plantar surface of heel is soft, but tender.   Lab Results:   Basename 12/02/12 0440 12/01/12 0526 11/30/12 0507  WBC 8.1 8.9 8.8  HGB 8.4* 8.6* 7.8*  HCT 25.9* 26.6* 24.5*  PLT 207 210 186   BMET  Basename 12/02/12 0440 12/01/12 0526 11/30/12 0507  NA 138 139 140  K 3.6 3.4* 3.7  CL 106 104 105  CO2 21 22 22   GLUCOSE 138* 139* 171*  BUN 53* 59* 68*  CREATININE 2.02* 2.22* 2.24*  CALCIUM 9.1 9.2 8.9   PT/INR  Basename 12/02/12 0440 12/01/12 0526 11/30/12 0507  LABPROT 23.6* 23.6* 22.2*  INR 2.21* 2.21* 2.04*    Assessment/Plan   Principal Problem:  *Diabetic foot ulcer Active Problems:  Hx of CABG x 6 2003  ICD in place, MDT May 2011  PAF (paroxysmal atrial fibrillation)  Diabetes mellitus type 2, insulin dependent  CKD (chronic kidney disease) stage 4, GFR  15-29 ml/min  Positive Coombs test  Anemia due to chronic illness  HTN (hypertension)  Ischemic cardiomyopathy, EF 25% 2D 12/12  PVD, Lt popliteal HSRA 7/13  Dyslipidemia  Chronic anticoagulation  Non-sustained ventricular tachycardia  Sleep apnea, C-Pap intol  Gout  Hypothyroidism  Chronic systolic CHF (congestive heart failure)  S/P angioplasty, 11/27/12 -Lt below the knee popliteal artery  Plan:  BKA planned by Ortho.  Holding coumadin.  Will add back heparin when INR < 2.0.  BP and HR stable for the most part.  Last BP was a little low.     LOS: 8 days    HAGER, BRYAN 12/02/2012 3:28 PM  I have seen and evaluated the patient this morning along with Wilburt Finlay, PA. I agree with his findings, examination as well as impression recommendations.  Sad scenario, but unlikely to have meaningful healing of LL wound -- seen by ortho, plan is to hold warfarin & schedule BKA for Friday. BP a bit low, but stable.  NO Sx of HF or angina.  He did ask me to look @ R heel -- not assessed on recent angio;may well need special shoe to protect from additional wounds.  Marykay Lex, M.D., M.S. [facility] [address] [place], [place]  [address]  [phone] Pager # [phone] 12/02/2012 3:57 PM

## 2012-12-02 NOTE — Progress Notes (Signed)
Rehab Admissions Coordinator Note:  Patient was screened by Clois Dupes for appropriateness for an Inpatient Acute Rehab Consult.  Noted P.T. Recommendations. Possible plan for BKA Friday. At this time, we are recommending await surgical intervention and then rehab consult postop.  Clois Dupes, RN 12/02/2012, 12:02 PM  I can be reached at [phone].

## 2012-12-03 LAB — GLUCOSE, CAPILLARY
Glucose-Capillary: 204 mg/dL — ABNORMAL HIGH (ref 70–99)
Glucose-Capillary: 144 mg/dL — ABNORMAL HIGH (ref 70–99)

## 2012-12-03 LAB — BASIC METABOLIC PANEL
CO2: 21 mEq/L (ref 19–32)
Chloride: 108 mEq/L (ref 96–112)
Creatinine, Ser: 2.02 mg/dL — ABNORMAL HIGH (ref 0.50–1.35)
GFR calc Af Amer: 35 mL/min — ABNORMAL LOW (ref >90)
Potassium: 4 mEq/L (ref 3.5–5.1)
Sodium: 142 mEq/L (ref 135–145)

## 2012-12-03 LAB — PROTIME-INR
INR: 2.04 — ABNORMAL HIGH (ref 0.00–1.49)
Prothrombin Time: 22.2 seconds — ABNORMAL HIGH (ref 11.6–15.2)

## 2012-12-03 LAB — CBC
HCT: 28.4 % — ABNORMAL LOW (ref 39.0–52.0)
Hemoglobin: 9.2 g/dL — ABNORMAL LOW (ref 13.0–17.0)
RBC: 3.29 MIL/uL — ABNORMAL LOW (ref 4.22–5.81)
WBC: 8.1 10*3/uL (ref 4.0–10.5)

## 2012-12-03 MED ORDER — ENOXAPARIN SODIUM 100 MG/ML ~~LOC~~ SOLN
1.0000 mg/kg | Freq: Two times a day (BID) | SUBCUTANEOUS | Status: AC
  Administered 2012-12-03 – 2012-12-04 (×3): 95 mg via SUBCUTANEOUS
  Filled 2012-12-03 (×3): qty 1

## 2012-12-03 NOTE — Progress Notes (Signed)
[facility]  Subjective: Seems dejected, but resolved with fate for BKA.  Objective: Vital signs in last 24 hours: Temp:  [97.6 F (36.4 C)-98.6 F (37 C)] 97.6 F (36.4 C) (01/01 0458) Pulse Rate:  [55-62] 55  (01/01 0850) Resp:  [18] 18  (01/01 0458) BP: (91-135)/(54-91) 114/91 mmHg (01/01 0850) SpO2:  [98 %-99 %] 99 % (01/01 0458) Last BM Date: 12/02/12  Intake/Output from previous day: 12/31 0701 - 01/01 0700 In: 480 [P.O.:480] Out: 200 [Urine:200] Intake/Output this shift: Total I/O In: 120 [P.O.:120] Out: 100 [Urine:100]  Medications Current Facility-Administered Medications  Medication Dose Route Frequency Provider Last Rate Last Dose  . acetaminophen (TYLENOL) tablet 650 mg  650 mg Oral Q4H PRN Abelino Derrick, PA      . acetaminophen (TYLENOL) tablet 650 mg  650 mg Oral Q4H PRN Runell Gess, MD      . allopurinol (ZYLOPRIM) tablet 100 mg  100 mg Oral Daily Eda Paschal [place], [place]   100 mg at 12/03/12 1003  . carvedilol (COREG) tablet 25 mg  25 mg Oral BID WC Abelino Derrick, PA   25 mg at 12/03/12 0800  . hydrALAZINE (APRESOLINE) tablet 25 mg  25 mg Oral TID Abelino Derrick, PA   25 mg at 12/03/12 1002  . insulin aspart (novoLOG) injection 0-15 Units  0-15 Units Subcutaneous TID WC Eda Paschal [place], PA   5 Units at 12/03/12 1130  . insulin aspart (novoLOG) injection 0-5 Units  0-5 Units Subcutaneous QHS Eda Paschal [place], [place]   2 Units at 11/30/12 2203  . insulin glargine (LANTUS) injection 10 Units  10 Units Subcutaneous BID Nada Boozer, NP   10 Units at 12/03/12 1000  . isosorbide dinitrate (ISORDIL) tablet 20 mg  20 mg Oral TID Abelino Derrick, PA   20 mg at 12/03/12 1002  . levothyroxine (SYNTHROID, LEVOTHROID) tablet 50 mcg  50 mcg Oral QAC breakfast Eda Paschal [place], [place]   50 mcg at 12/03/12 2536  . morphine 2 MG/ML injection 2 mg  2 mg Intravenous Q1H PRN Runell Gess, MD   2 mg at 11/27/12 2045  . ondansetron (ZOFRAN) injection 4 mg  4 mg  Intravenous Q6H PRN Abelino Derrick, PA   4 mg at 11/27/12 2045  . ondansetron (ZOFRAN) injection 4 mg  4 mg Intravenous Q6H PRN Runell Gess, MD      . oxyCODONE-acetaminophen (PERCOCET/ROXICET) 5-325 MG per tablet 1-2 tablet  1-2 tablet Oral Q4H PRN Abelino Derrick, PA   2 tablet at 12/02/12 2150  . pantoprazole (PROTONIX) EC tablet 40 mg  40 mg Oral Daily Eda Paschal [place], [place]   40 mg at 12/03/12 1002  . simvastatin (ZOCOR) tablet 40 mg  40 mg Oral QHS Eda Paschal [place], [place]   40 mg at 12/02/12 2150  . traMADol (ULTRAM) tablet 50 mg  50 mg Oral Q6H PRN Abelino Derrick, PA      . zolpidem (AMBIEN) tablet 5 mg  5 mg Oral QHS PRN Abelino Derrick, PA        PE: General appearance: alert, cooperative, appears stated age and no distress - just seems depressed Neck: no adenopathy, no carotid bruit and no JVD Lungs: clear to auscultation bilaterally, normal percussion bilaterally and non-labored Heart: irregularly irregular rhythm and S1, S2 normal Abdomen: soft, non-tender; bowel sounds normal; no masses,  no organomegaly Extremities: Dressing on LLE intact.  Able to walk.  Lab Results:   Basename 12/03/12 0500 12/02/12 0440 12/01/12 0526  WBC 8.1 8.1 8.9  HGB 9.2* 8.4* 8.6*  HCT 28.4* 25.9* 26.6*  PLT 233 207 210   BMET  Basename 12/03/12 0500 12/02/12 0440 12/01/12 0526  NA 142 138 139  K 4.0 3.6 3.4*  CL 108 106 104  CO2 21 21 22   GLUCOSE 127* 138* 139*  BUN 51* 53* 59*  CREATININE 2.02* 2.02* 2.22*  CALCIUM 9.2 9.1 9.2   PT/INR  Basename 12/03/12 0500 12/02/12 0440 12/01/12 0526  LABPROT 22.2* 23.6* 23.6*  INR 2.04* 2.21* 2.21*    Assessment/Plan   Principal Problem:  *Diabetic foot ulcer Active Problems:  Hx of CABG x 6 2003  ICD in place, MDT May 2011  PAF (paroxysmal atrial fibrillation)  Diabetes mellitus type 2, insulin dependent  CKD (chronic kidney disease) stage 4, GFR 15-29 ml/min  Positive Coombs test  Anemia due to chronic illness  HTN (hypertension)  Ischemic  cardiomyopathy, EF 25% 2D 12/12  PVD, Lt popliteal HSRA 7/13  Dyslipidemia  Chronic anticoagulation  Non-sustained ventricular tachycardia  Sleep apnea, C-Pap intol  Gout  Hypothyroidism  Chronic systolic CHF (congestive heart failure)  S/P angioplasty, 11/27/12 -Lt below the knee popliteal artery Sad scenario, but unlikely to have meaningful healing of LL wound despite PTA revascularization-- seen by ortho, plan is to hold warfarin & schedule BKA for Friday.  Plan:    Holding coumadin - adding bid Enoxaparin as INR now ~2.     BP and HR stable for the most part.   NO Sx of HF or angina. -- continue other cardiac meds (Carvedilol, Hydralazine/Nitrate) & statin; not on ASA for unknown reason - will defer to primary Cardiologist.  Glycemic control remains pretty stable on BID Lantus, TID Meal coverage -- not requiring much SSI coverage.  Will need close monitoring of the contralateral limb as he has the initial stages of wounds.    LOS: 9 days   HARDING,DAVID W, M.D., M.S. [facility] [address] [place], [place]  [address]  [phone] Pager # [phone] 12/03/2012 11:35 AM

## 2012-12-04 LAB — GLUCOSE, CAPILLARY
Glucose-Capillary: 127 mg/dL — ABNORMAL HIGH (ref 70–99)
Glucose-Capillary: 183 mg/dL — ABNORMAL HIGH (ref 70–99)
Glucose-Capillary: 164 mg/dL — ABNORMAL HIGH (ref 70–99)

## 2012-12-04 LAB — BASIC METABOLIC PANEL
Calcium: 8.8 mg/dL (ref 8.4–10.5)
Creatinine, Ser: 2.03 mg/dL — ABNORMAL HIGH (ref 0.50–1.35)
GFR calc non Af Amer: 30 mL/min — ABNORMAL LOW (ref >90)
Glucose, Bld: 154 mg/dL — ABNORMAL HIGH (ref 70–99)
Sodium: 140 mEq/L (ref 135–145)

## 2012-12-04 LAB — CBC
MCV: 86.5 fL (ref 78.0–100.0)
Platelets: 220 10*3/uL (ref 150–400)
RDW: 17.5 % — ABNORMAL HIGH (ref 11.5–15.5)
WBC: 8.7 10*3/uL (ref 4.0–10.5)

## 2012-12-04 LAB — WOUND CULTURE

## 2012-12-04 NOTE — Progress Notes (Signed)
Subjective: 7 Days Post-Op Procedure(s) (LRB): LOWER EXTREMITY ANGIOGRAM (N/A) PTA FEMORAL POPLITEAL ARTERY (Left) Patient reports pain as mild.   Concerned about his right heel. Has ulcer and is painful. Pillow in bed but no under his leg to float heels.   Pts INR 2.19 today.  Advised him we will not anticipate surgery tomorrow as his value will most likely take a few days to normalize. Objective: Vital signs in last 24 hours: Temp:  [97.8 F (36.6 C)-98.3 F (36.8 C)] 97.8 F (36.6 C) (01/02 0510) Pulse Rate:  [50-55] 55  (01/02 0510) Resp:  [18-19] 19  (01/02 0510) BP: (126-135)/(52-58) 129/56 mmHg (01/02 0510) SpO2:  [96 %-99 %] 96 % (01/02 0510)  Intake/Output from previous day: 01/01 0701 - 01/02 0700 In: 420 [P.O.:420] Out: 400 [Urine:400] Intake/Output this shift:     Basename 12/04/12 0440 12/03/12 0500 12/02/12 0440  HGB 8.6* 9.2* 8.4*    Basename 12/04/12 0440 12/03/12 0500  WBC 8.7 8.1  RBC 3.10* 3.29*  HCT 26.8* 28.4*  PLT 220 233    Basename 12/04/12 0440 12/03/12 0500  NA 140 142  K 4.1 4.0  CL 106 108  CO2 22 21  BUN 46* 51*  CREATININE 2.03* 2.02*  GLUCOSE 154* 127*  CALCIUM 8.8 9.2    Basename 12/04/12 0440 12/03/12 0500  LABPT -- --  INR 2.19* 2.04*    right heel with fluid filled ulcer.  min erythema.  fluctuant.  no breakdown  Assessment/Plan: 7 Days Post-Op Procedure(s) (LRB): LOWER EXTREMITY ANGIOGRAM (N/A) PTA FEMORAL POPLITEAL ARTERY (Left) Will monitor INR.  At 2.19 would not expect it to normalize by tomorrow.  Possibly Monday for BKA.  Will discuss timing with Dr Ophelia Charter and MD on call for the weekend. Skin Care Team consult to assist with treatment of right heel ulcer.  Will need to float heels.  PRAFO device may be necessary if pt cannot keep it elevated himself.  Kaid Seeberger M 12/04/2012, 8:50 AM

## 2012-12-04 NOTE — Progress Notes (Signed)
Subjective: Plan for BKA  Objective: Vital signs in last 24 hours: Temp:  [97.8 F (36.6 C)-98.3 F (36.8 C)] 97.8 F (36.6 C) (01/02 0510) Pulse Rate:  [50-55] 55  (01/02 0510) Resp:  [18-19] 19  (01/02 0510) BP: (114-135)/(52-91) 129/56 mmHg (01/02 0510) SpO2:  [96 %-99 %] 96 % (01/02 0510) Weight change:  Last BM Date: 12/02/12 Intake/Output from previous day: ? 01/01 0701 - 01/02 0700 In: 420 [P.O.:420] Out: 400 [Urine:400] Intake/Output this shift:    PE: General:alert and oriented, sad about loss of lower leg Heart:S1S2 Irreg irreg pacing Lungs:few crackles Abd:+ BS soft, non tender Ext:+ edema     Lab Results:  Basename 12/04/12 0440 12/03/12 0500  WBC 8.7 8.1  HGB 8.6* 9.2*  HCT 26.8* 28.4*  PLT 220 233   BMET  Basename 12/04/12 0440 12/03/12 0500  NA 140 142  K 4.1 4.0  CL 106 108  CO2 22 21  GLUCOSE 154* 127*  BUN 46* 51*  CREATININE 2.03* 2.02*  CALCIUM 8.8 9.2   No results found for this basename: TROPONINI:2,CK,MB:2 in the last 72 hours  Lab Results  Component Value Date   CHOL  Value: 115        ATP III CLASSIFICATION:  <200     mg/dL   Desirable  161-096  mg/dL   Borderline High  >=045    mg/dL   High 40/08/8118   HDL 25* 11/07/2007   LDLCALC  Value: 50        Total Cholesterol/HDL:CHD Risk Coronary Heart Disease Risk Table                     Men   Women  1/2 Average Risk   3.4   3.3 11/07/2007   TRIG 198* 11/07/2007   CHOLHDL 4.6 11/07/2007   Lab Results  Component Value Date   HGBA1C 8.4* 11/24/2012     Lab Results  Component Value Date   TSH 1.293 11/24/2012      Studies/Results: No results found.  Medications: I have reviewed the patient's current medications.    Marland Kitchen allopurinol  100 mg Oral Daily  . carvedilol  25 mg Oral BID WC  . enoxaparin (LOVENOX) injection  1 mg/kg Subcutaneous Q12H  . hydrALAZINE  25 mg Oral TID  . insulin aspart  0-15 Units Subcutaneous TID WC  . insulin aspart  0-5 Units Subcutaneous QHS  . insulin  glargine  10 Units Subcutaneous BID  . isosorbide dinitrate  20 mg Oral TID  . levothyroxine  50 mcg Oral QAC breakfast  . pantoprazole  40 mg Oral Daily  . simvastatin  40 mg Oral QHS   Assessment/Plan: Principal Problem:  *Diabetic foot ulcer Active Problems:  CKD (chronic kidney disease) stage 4, GFR 15-29 ml/min  PVD, Lt popliteal HSRA 7/13  Non-sustained ventricular tachycardia  S/P angioplasty, 11/27/12 -Lt below the knee popliteal artery  ICD in place, MDT May 2011  Diabetes mellitus type 2, insulin dependent  Ischemic cardiomyopathy, EF 25% 2D 12/12  Chronic anticoagulation  Chronic systolic CHF (congestive heart failure)  Hx of CABG x 6 2003  PAF (paroxysmal atrial fibrillation)  Sleep apnea, C-Pap intol  Gout  Hypothyroidism  Positive Coombs test  Anemia due to chronic illness  HTN (hypertension)  Dyslipidemia  PLAN: INR 2.19 off coumadin for BKA. On Lovenox for chronic a. Fib. CKD stable Anemia stable at 8.6, continue to monitor Diabetes, stable.    LOS: 10 days  INGOLD,LAURA R 12/04/2012, 8:14 AM   Agree with note written by Nada Boozer RNP  S/P Left popliteal artery PTA by myself 12/26. Non healing Left ankle/heel ulcer. Cardiac stable. CAF. INR drifting down. For BKA when INR acceptable. Change to IV hep once INR < 2.0.  Runell Gess 12/04/2012 8:58 AM

## 2012-12-04 NOTE — Consult Note (Signed)
WOC consult Note Reason for Consult: evaluate new heel ulcer. Pt reports starting hurting over last few days.  Pt using this heel to push himself up in the bed, since he is not able to use the left leg. Planned for BKA once INR stable.  Currently in Prevalon boots for offloading bilaterally, however these were only placed today.  Pt alert and oriented, however not sure if with DM neuropathy he realized his heel was staying on the bed and he was not moving the heel off the bed as often as he should. Also with floating of his heels when he moves around in the bed the pillows will shift making it very difficult to completely float all the time. Nursing also pointed out erthyma of the right inner lower leg, I would be concerned this area is related to his vascular status, not pressure.  Wound type: Stage II pressure ulcer of the right heel Pressure Ulcer POA: Yes to the left heel, new right heel ulcer Measurement:2.5cm x 3.5cm x 0 Wound XBJ:YNWGNF filled blister Drainage (amount, consistency, odor) none Periwound:intact and blanchable Dressing procedure/placement/frequency: silicone foam dressing for the heel, offload with prevalon boots at all times when in bed.  Monitor closely for this area to remain intact.   WOC will follow at least weekly for progress and resolution of this area. Fillmore Bynum [place] RN,CWOCN [phone]

## 2012-12-04 NOTE — Progress Notes (Signed)
Physical Therapy Treatment Patient Details Name: [name] MRN: [id number] DOB: [birth year] Today's Date: 12/04/2012 Time: 1191-4782 PT Time Calculation (min): 18 min  PT Assessment / Plan / Recommendation Comments on Treatment Session  Pt expected dejected about having to go to surgery for amputation.  Will likely need CIR post acute before D/C home.    Follow Up Recommendations  CIR     Does the patient have the potential to tolerate intense rehabilitation     Barriers to Discharge        Equipment Recommendations  Other (comment);None recommended by PT    Recommendations for Other Services    Frequency Min 3X/week   Plan Discharge plan remains appropriate;Frequency remains appropriate    Precautions / Restrictions     Pertinent Vitals/Pain     Mobility  Bed Mobility Bed Mobility: Sit to Supine Supine to Sit: 7: Independent Sitting - Scoot to Edge of Bed: 7: Independent Transfers Transfers: Sit to Stand;Stand to Sit Sit to Stand: 5: Supervision;With upper extremity assist;From bed Stand to Sit: 5: Supervision;With upper extremity assist;To bed Details for Transfer Assistance: safe technique Ambulation/Gait Ambulation/Gait Assistance: 5: Supervision Ambulation Distance (Feet): 350 Feet Assistive device: Rolling walker Ambulation/Gait Assistance Details: generally steady throughout Gait Pattern: Step-through pattern;Decreased stride length;Antalgic Stairs: No    Exercises     PT Diagnosis:    PT Problem List:   PT Treatment Interventions:     PT Goals Acute Rehab PT Goals Time For Goal Achievement: 12/16/12 Potential to Achieve Goals: Good PT Goal: Sit to Stand - Progress: Progressing toward goal PT Transfer Goal: Bed to Chair/Chair to Bed - Progress: Progressing toward goal PT Goal: Ambulate - Progress: Progressing toward goal  Visit Information  Last PT Received On: 12/04/12 Assistance Needed: +1    Subjective Data  Subjective: There's not much  I can do about it, the doc messed up the foot and everyone says I need to cut it off. Patient Stated Goal: Independent   Cognition  Overall Cognitive Status: Appears within functional limits for tasks assessed/performed Arousal/Alertness: Awake/alert Behavior During Session: [facility] for tasks performed    Balance  Balance Balance Assessed: No  End of Session PT - End of Session Activity Tolerance: Patient tolerated treatment well Patient left: Other (comment);with call bell/phone within reach (sitting EOB) Nurse Communication: Mobility status   GP     [name], Eliseo Gum 12/04/2012, 2:37 PM  12/04/2012  [place] Bing, PT [phone] (pager)

## 2012-12-05 LAB — CBC
Platelets: 221 10*3/uL (ref 150–400)
RDW: 17.5 % — ABNORMAL HIGH (ref 11.5–15.5)
WBC: 9.3 10*3/uL (ref 4.0–10.5)

## 2012-12-05 LAB — BASIC METABOLIC PANEL
Calcium: 9 mg/dL (ref 8.4–10.5)
GFR calc non Af Amer: 30 mL/min — ABNORMAL LOW (ref >90)
Sodium: 138 mEq/L (ref 135–145)

## 2012-12-05 LAB — PROTIME-INR
INR: 1.99 — ABNORMAL HIGH (ref 0.00–1.49)
Prothrombin Time: 21.8 seconds — ABNORMAL HIGH (ref 11.6–15.2)

## 2012-12-05 MED ORDER — ENOXAPARIN SODIUM 100 MG/ML ~~LOC~~ SOLN
1.0000 mg/kg | Freq: Two times a day (BID) | SUBCUTANEOUS | Status: AC
  Administered 2012-12-05 – 2012-12-06 (×3): 95 mg via SUBCUTANEOUS
  Filled 2012-12-05 (×4): qty 1

## 2012-12-05 MED ORDER — VITAMIN K1 10 MG/ML IJ SOLN
10.0000 mg | Freq: Once | INTRAVENOUS | Status: AC
  Administered 2012-12-05: 10 mg via INTRAVENOUS
  Filled 2012-12-05: qty 1

## 2012-12-05 NOTE — Progress Notes (Signed)
Subjective: No complaints  Objective: Vital signs in last 24 hours: Temp:  [98.1 F (36.7 C)-98.3 F (36.8 C)] 98.3 F (36.8 C) (01/03 0522) Pulse Rate:  [52-54] 52  (01/03 0522) Resp:  [17-18] 17  (01/03 0522) BP: (119-140)/(51-72) 119/57 mmHg (01/03 0522) SpO2:  [96 %-98 %] 97 % (01/03 0522) Weight change:  Last BM Date: 12/03/12 Intake/Output from previous day:-125 01/02 0701 - 01/03 0700 In: 900 [P.O.:900] Out: 1025 [Urine:1025] Intake/Output this shift: Total I/O In: 240 [P.O.:240] Out: 100 [Urine:100]  PE: General:alert and oriented Heart:irreg irreg with pacing Lungs:few crackles but no wheezes Abd:+ BS, soft non tender Ext:+ edema, ulcer on rt heel wound care following, boot in place Lt foot amputation on Monday.    Lab Results:  Basename 12/05/12 0440 12/04/12 0440  WBC 9.3 8.7  HGB 8.4* 8.6*  HCT 26.3* 26.8*  PLT 221 220   BMET  Basename 12/05/12 0440 12/04/12 0440  NA 138 140  K 4.2 4.1  CL 105 106  CO2 21 22  GLUCOSE 151* 154*  BUN 42* 46*  CREATININE 2.07* 2.03*  CALCIUM 9.0 8.8   No results found for this basename: TROPONINI:2,CK,MB:2 in the last 72 hours  Lab Results  Component Value Date   CHOL  Value: 115        ATP III CLASSIFICATION:  <200     mg/dL   Desirable  409-811  mg/dL   Borderline High  >=914    mg/dL   High 78/01/9561   HDL 25* 11/07/2007   LDLCALC  Value: 50        Total Cholesterol/HDL:CHD Risk Coronary Heart Disease Risk Table                     Men   Women  1/2 Average Risk   3.4   3.3 11/07/2007   TRIG 198* 11/07/2007   CHOLHDL 4.6 11/07/2007   Lab Results  Component Value Date   HGBA1C 8.4* 11/24/2012     Lab Results  Component Value Date   TSH 1.293 11/24/2012     Studies/Results: No results found.  Medications: I have reviewed the patient's current medications.    Marland Kitchen allopurinol  100 mg Oral Daily  . carvedilol  25 mg Oral BID WC  . enoxaparin (LOVENOX) injection  1 mg/kg Subcutaneous Q12H  . hydrALAZINE   25 mg Oral TID  . insulin aspart  0-15 Units Subcutaneous TID WC  . insulin aspart  0-5 Units Subcutaneous QHS  . insulin glargine  10 Units Subcutaneous BID  . isosorbide dinitrate  20 mg Oral TID  . levothyroxine  50 mcg Oral QAC breakfast  . pantoprazole  40 mg Oral Daily  . simvastatin  40 mg Oral QHS   Assessment/Plan: Principal Problem:  *Diabetic foot ulcer Active Problems:  CKD (chronic kidney disease) stage 4, GFR 15-29 ml/min  PVD, Lt popliteal HSRA 7/13  Non-sustained ventricular tachycardia  S/P angioplasty, 11/27/12 -Lt below the knee popliteal artery  ICD in place, MDT May 2011  Diabetes mellitus type 2, insulin dependent  Ischemic cardiomyopathy, EF 25% 2D 12/12  Chronic anticoagulation  Chronic systolic CHF (congestive heart failure)  Hx of CABG x 6 2003  PAF (paroxysmal atrial fibrillation)  Sleep apnea, C-Pap intol  Gout  Hypothyroidism  Positive Coombs test  Anemia due to chronic illness  HTN (hypertension)  Dyslipidemia  PLAN:  For surgery on Monday, INR slowly drifting down,  Continues with anemia,   Would  he benefit from transfusion?  Glucose stable Rt heel ulcer new, wound care following  LOS: 11 days   INGOLD,LAURA R 12/05/2012, 11:51 AM   I have seen and examined the patient along with [facility] R, NP.  I have reviewed the chart, notes and new data.  I agree with NP's note.  Transfuse if Hgb less than 8.  Thurmon Fair, MD, [facility] [phone] 12/05/2012, 2:24 PM

## 2012-12-05 NOTE — Progress Notes (Signed)
ANTICOAGULATION CONSULT NOTE - Follow Up Consult  Pharmacy Consult for Lovenox Indication: atrial fibrillation  No Known Allergies  Patient Measurements: Height: 6\' 2"  (188 cm) Weight: 212 lb 8.4 oz (96.4 kg) IBW/kg (Calculated) : 82.2  Heparin Dosing Weight:   Vital Signs: Temp: 98.3 F (36.8 C) (01/03 0522) Temp src: Oral (01/03 0522) BP: 119/57 mmHg (01/03 0522) Pulse Rate: 52  (01/03 0522)  Labs:  Basename 12/05/12 0440 12/04/12 0440 12/03/12 0500  HGB 8.4* 8.6* --  HCT 26.3* 26.8* 28.4*  PLT 221 220 233  APTT -- -- --  LABPROT 21.8* 23.4* 22.2*  INR 1.99* 2.19* 2.04*  HEPARINUNFRC -- -- --  CREATININE 2.07* 2.03* 2.02*  CKTOTAL -- -- --  CKMB -- -- --  TROPONINI -- -- --    Estimated Creatinine Clearance: 35.8 ml/min (by C-G formula based on Cr of 2.07).  Assessment: CC: infected foot ulcer  PMH: HTN, PVD, COPD, DM, s/p CABG, gout, afib, hypothyroid, ICD, CKD, CHF   AC: Warfarin for afib and PVD PTA, now on LMWH - S/p angiogram. R heel ulcer painful. (PTA dose: 5mg  qday except 2.5mg  on Tuesdays and Fridays) .Coumadin now on hold for possible BKA. INR still elevated 1.99. Hgb low at 8.4. Platelets stable.  ID: Afebrile. WBC 9.3. New R heel ulcer.   CV: HTN, HLD, PVD, ICD, ICM, EF 25%, CHF, CABG '03, PAF,VSS. On coreg, hydralazine, isosorbide and zocor. BP 119/57, but HR 52 this am.  Endo: cbgs uncontrolled (127-183), DM, hypothyroid, gout. Lantus, SSI, synthroid  GI/Nutr: cardiac diet, po PPI  Neuro: AO X 3  Renal: CKD. Scr 2.03 CrCl ~35  Pulm: OSA  Heme/Onc: Anemia of chronic dz, Hg 8.4, PTLC 220 . S/p angio of the segmental restenosis of the knee (not requiring stenting)  Meds: Lasix, zaroxlyn  Goal of Therapy:  Anti-Xa level 0.6-1.2 units/ml 4hrs after LMWH dose given Monitor platelets by anticoagulation protocol: Yes   Plan:  -hold coumadin per ortho -Lovenox 1 mg/kg q12 hours= 95mg  sq q12h  Misty Stanley Stillinger 12/05/2012,9:38  AM

## 2012-12-05 NOTE — Progress Notes (Addendum)
Subjective:  " my right heel now has a blister on it"      " I am ready for the leftt below knee amputation"  Objective: Vital signs in last 24 hours: Temp:  [98.1 F (36.7 C)-98.3 F (36.8 C)] 98.3 F (36.8 C) (01/03 0522) Pulse Rate:  [52-54] 52  (01/03 0522) Resp:  [17-18] 17  (01/03 0522) BP: (119-140)/(51-72) 119/57 mmHg (01/03 0522) SpO2:  [96 %-98 %] 97 % (01/03 0522)  Intake/Output from previous day: 01/02 0701 - 01/03 0700 In: 900 [P.O.:900] Out: 1025 [Urine:1025] Intake/Output this shift: Total I/O In: 240 [P.O.:240] Out: 100 [Urine:100]   Basename 12/05/12 0440 12/04/12 0440 12/03/12 0500  HGB 8.4* 8.6* 9.2*    Basename 12/05/12 0440 12/04/12 0440  WBC 9.3 8.7  RBC 3.03* 3.10*  HCT 26.3* 26.8*  PLT 221 220    Basename 12/05/12 0440 12/04/12 0440  NA 138 140  K 4.2 4.1  CL 105 106  CO2 21 22  BUN 42* 46*  CREATININE 2.07* 2.03*  GLUCOSE 151* 154*  CALCIUM 9.0 8.8    Basename 12/05/12 0440 12/04/12 0440  LABPT -- --  INR 1.99* 2.19*    right hee with soft blister slight erythema , no cellulitis.   left foot unchanged for BKA on Monday.   Assessment/Plan: Left BKA     On Monday AM   With possible right heel debridement.  Stop Lovenox on Sunday PM   Garlen Reinig C 12/05/2012,    protime has been slow to correct.

## 2012-12-06 LAB — CBC
HCT: 27.5 % — ABNORMAL LOW (ref 39.0–52.0)
Hemoglobin: 8.7 g/dL — ABNORMAL LOW (ref 13.0–17.0)
MCH: 27.3 pg (ref 26.0–34.0)
MCHC: 31.6 g/dL (ref 30.0–36.0)
RDW: 17.4 % — ABNORMAL HIGH (ref 11.5–15.5)

## 2012-12-06 LAB — BASIC METABOLIC PANEL
BUN: 40 mg/dL — ABNORMAL HIGH (ref 6–23)
CO2: 22 mEq/L (ref 19–32)
Calcium: 8.9 mg/dL (ref 8.4–10.5)
Chloride: 106 mEq/L (ref 96–112)
Creatinine, Ser: 1.99 mg/dL — ABNORMAL HIGH (ref 0.50–1.35)

## 2012-12-06 LAB — GLUCOSE, CAPILLARY
Glucose-Capillary: 207 mg/dL — ABNORMAL HIGH (ref 70–99)
Glucose-Capillary: 155 mg/dL — ABNORMAL HIGH (ref 70–99)
Glucose-Capillary: 186 mg/dL — ABNORMAL HIGH (ref 70–99)

## 2012-12-06 MED ORDER — HEPARIN (PORCINE) IN NACL 100-0.45 UNIT/ML-% IJ SOLN
1850.0000 [IU]/h | INTRAMUSCULAR | Status: DC
  Administered 2012-12-06 – 2012-12-07 (×2): 1850 [IU]/h via INTRAVENOUS
  Filled 2012-12-06 (×4): qty 250

## 2012-12-06 NOTE — Progress Notes (Signed)
ANTICOAGULATION CONSULT NOTE - Follow Up Consult  Pharmacy Consult for:   change to UFH from LMWH Indication: atrial fibrillation  No Known Allergies  Patient Measurements: Height: 6\' 2"  (188 cm) Weight: 212 lb 8.4 oz (96.4 kg) IBW/kg (Calculated) : 82.2    Vital Signs: Temp: 97.7 F (36.5 C) (01/04 0620) Temp src: Oral (01/04 0620) BP: 137/62 mmHg (01/04 0620) Pulse Rate: 54  (01/04 0620)  Labs:  Basename 12/06/12 0650 12/05/12 0440 12/04/12 0440  HGB 8.7* 8.4* --  HCT 27.5* 26.3* 26.8*  PLT 228 221 220  APTT -- -- --  LABPROT 18.0* 21.8* 23.4*  INR 1.54* 1.99* 2.19*  HEPARINUNFRC -- -- --  CREATININE 1.99* 2.07* 2.03*  CKTOTAL -- -- --  CKMB -- -- --  TROPONINI -- -- --    Estimated Creatinine Clearance: 37.3 ml/min (by C-G formula based on Cr of 1.99).  Assessment:  Warfarin for afib and PVD PTA - S/p angiogram.   Scheduled for Left BKA and possible R heel debridement Monday 1/6.   Requested to change LMWH to IV heparin in preparation for OR for L BKA Monday.  INR 1.54 after Vitamin K 10 mg IV given 1/3 ~ 1700.  H/H low but stable. No acute need for transfusion.  Previously this admission his heparin level was 0.33 on heparin drip at 1850 units/hr while INR was 2.21.  (PTA dose: 5mg  qday except 2.5mg  on Tuesdays and Fridays) .  Goal of Therapy:  Heparin level 0.3 - 0.7 Monitor platelets by anticoagulation protocol: Yes   Plan:  1. DC LMWH 2. Start heparin drip with no bolus after 75% of the dosing interval - start tonight at 1930 at previous therapeutic rate of 1850 units/hr 3. Check daily am HL/INR/CBC. Herby Abraham, Pharm.D. [phone] 12/06/2012 1:59 PM

## 2012-12-06 NOTE — Progress Notes (Signed)
Subjective: 9 Days Post-Op Procedure(s) (LRB): LOWER EXTREMITY ANGIOGRAM (N/A) PTA FEMORAL POPLITEAL ARTERY (Left) Patient reports pain as mild.    Objective: Vital signs in last 24 hours: Temp:  [97.4 F (36.3 C)-99.4 F (37.4 C)] 97.7 F (36.5 C) (01/04 0620) Pulse Rate:  [53-55] 54  (01/04 0620) Resp:  [18-19] 19  (01/04 0620) BP: (108-137)/(54-62) 137/62 mmHg (01/04 0620) SpO2:  [99 %-100 %] 100 % (01/04 0620)  Intake/Output from previous day: 01/03 0701 - 01/04 0700 In: 770 [P.O.:720] Out: 400 [Urine:400] Intake/Output this shift:     Basename 12/06/12 0650 12/05/12 0440 12/04/12 0440  HGB 8.7* 8.4* 8.6*    Basename 12/06/12 0650 12/05/12 0440  WBC 8.6 9.3  RBC 3.19* 3.03*  HCT 27.5* 26.3*  PLT 228 221    Basename 12/06/12 0650 12/05/12 0440  NA 139 138  K 4.4 4.2  CL 106 105  CO2 22 21  BUN 40* 42*  CREATININE 1.99* 2.07*  GLUCOSE 158* 151*  CALCIUM 8.9 9.0    Basename 12/06/12 0650 12/05/12 0440  LABPT -- --  INR 1.54* 1.99*    no change in exam.   Assessment/Plan: 9 Days Post-Op Procedure(s) (LRB): LOWER EXTREMITY ANGIOGRAM (N/A) PTA FEMORAL POPLITEAL ARTERY (Left) Plan    INR improving . Surgery  Monday at 12:30  PM   .    Will need to be NPO after MN Sunday.   Akeylah Hendel C 12/06/2012, 9:35 AM

## 2012-12-06 NOTE — Progress Notes (Signed)
Subjective: No complaints - just doesn't like the food.  Objective: Vital signs in last 24 hours: Temp:  [97.4 F (36.3 C)-99.4 F (37.4 C)] 97.7 F (36.5 C) (01/04 0620) Pulse Rate:  [53-55] 54  (01/04 0620) Resp:  [18-19] 19  (01/04 0620) BP: (108-137)/(54-62) 137/62 mmHg (01/04 0620) SpO2:  [99 %-100 %] 100 % (01/04 0620) Weight change:  Last BM Date: 12/03/12 Intake/Output from previous day:-125 01/03 0701 - 01/04 0700 In: 770 [P.O.:720] Out: 400 [Urine:400] Intake/Output this shift:    PE: General:alert and oriented, depressed mood & flat affect. Heart:irreg irreg with pacing Lungs:few crackles but no wheezes, non-labored Abd:+ BS, soft non tender Ext:+ edema, ulcer on rt heel wound care following, boot in place Lt foot amputation on Monday.    Lab Results:  Basename 12/06/12 0650 12/05/12 0440  WBC 8.6 9.3  HGB 8.7* 8.4*  HCT 27.5* 26.3*  PLT 228 221   BMET  Basename 12/06/12 0650 12/05/12 0440  NA 139 138  K 4.4 4.2  CL 106 105  CO2 22 21  GLUCOSE 158* 151*  BUN 40* 42*  CREATININE 1.99* 2.07*  CALCIUM 8.9 9.0   No results found for this basename: TROPONINI:2,CK,MB:2 in the last 72 hours  Lab Results  Component Value Date   CHOL  Value: 115        ATP III CLASSIFICATION:  <200     mg/dL   Desirable  161-096  mg/dL   Borderline High  >=045    mg/dL   High 40/08/8118   HDL 25* 11/07/2007   LDLCALC  Value: 50        Total Cholesterol/HDL:CHD Risk Coronary Heart Disease Risk Table                     Men   Women  1/2 Average Risk   3.4   3.3 11/07/2007   TRIG 198* 11/07/2007   CHOLHDL 4.6 11/07/2007   Lab Results  Component Value Date   HGBA1C 8.4* 11/24/2012     Lab Results  Component Value Date   TSH 1.293 11/24/2012     Studies/Results: No results found.  Medications: I have reviewed the patient's current medications.    Marland Kitchen allopurinol  100 mg Oral Daily  . carvedilol  25 mg Oral BID WC  . hydrALAZINE  25 mg Oral TID  . insulin aspart   0-15 Units Subcutaneous TID WC  . insulin aspart  0-5 Units Subcutaneous QHS  . insulin glargine  10 Units Subcutaneous BID  . isosorbide dinitrate  20 mg Oral TID  . levothyroxine  50 mcg Oral QAC breakfast  . pantoprazole  40 mg Oral Daily  . simvastatin  40 mg Oral QHS   Assessment/Plan: Principal Problem:  *Diabetic foot ulcer Active Problems:  Hx of CABG x 6 2003  ICD in place, MDT May 2011  PAF (paroxysmal atrial fibrillation)  Diabetes mellitus type 2, insulin dependent  CKD (chronic kidney disease) stage 4, GFR 15-29 ml/min  Positive Coombs test  Anemia due to chronic illness  HTN (hypertension)  Ischemic cardiomyopathy, EF 25% 2D 12/12  PVD, Lt popliteal HSRA 7/13  Dyslipidemia  Chronic anticoagulation  Non-sustained ventricular tachycardia  Sleep apnea, C-Pap intol  Gout  Hypothyroidism  Chronic systolic CHF (congestive heart failure)  S/P angioplasty, 11/27/12 -Lt below the knee popliteal artery  PLAN:  For surgery on Monday, INR now subtherapeutic @ 1.54 - starting IV Heparin (less interaction with INR & CKD)  Continues to be anemic - no acute need for transfusion.  No active CHF/CAD concerns.  Just awaiting surgery - ? BKA on Monday.  DM control is relatively good - may need to increase Lantus coverage as CBGs are up a bit. BP & HR stable on BB, Hydralazine/Nitrate Renal function stable On statin On synthroid for hypothyroidism PPI for GI prophylaxis Now on IV Heparin for Afib/PE  VTE prophylaxis.   LOS: 12 days   HARDING,DAVID W 12/06/2012, 12:50 PM

## 2012-12-07 LAB — BASIC METABOLIC PANEL
BUN: 38 mg/dL — ABNORMAL HIGH (ref 6–23)
GFR calc Af Amer: 38 mL/min — ABNORMAL LOW (ref >90)
GFR calc non Af Amer: 33 mL/min — ABNORMAL LOW (ref >90)
Potassium: 3.8 mEq/L (ref 3.5–5.1)
Sodium: 139 mEq/L (ref 135–145)

## 2012-12-07 LAB — CBC
Platelets: 257 10*3/uL (ref 150–400)
RBC: 3.29 MIL/uL — ABNORMAL LOW (ref 4.22–5.81)
WBC: 9.8 10*3/uL (ref 4.0–10.5)

## 2012-12-07 LAB — GLUCOSE, CAPILLARY
Glucose-Capillary: 182 mg/dL — ABNORMAL HIGH (ref 70–99)
Glucose-Capillary: 181 mg/dL — ABNORMAL HIGH (ref 70–99)

## 2012-12-07 LAB — PROTIME-INR
INR: 1.43 (ref 0.00–1.49)
Prothrombin Time: 17.1 seconds — ABNORMAL HIGH (ref 11.6–15.2)

## 2012-12-07 LAB — HEPARIN LEVEL (UNFRACTIONATED): Heparin Unfractionated: 1.24 IU/mL — ABNORMAL HIGH (ref 0.30–0.70)

## 2012-12-07 MED ORDER — INSULIN GLARGINE 100 UNIT/ML ~~LOC~~ SOLN
12.0000 [IU] | Freq: Two times a day (BID) | SUBCUTANEOUS | Status: DC
  Administered 2012-12-07 – 2012-12-11 (×8): 12 [IU] via SUBCUTANEOUS

## 2012-12-07 MED ORDER — HEPARIN (PORCINE) IN NACL 100-0.45 UNIT/ML-% IJ SOLN
1100.0000 [IU]/h | INTRAMUSCULAR | Status: DC
  Administered 2012-12-07: 1200 [IU]/h via INTRAVENOUS
  Filled 2012-12-07 (×3): qty 250

## 2012-12-07 NOTE — Progress Notes (Signed)
ANTICOAGULATION CONSULT NOTE - Follow Up Consult  Pharmacy Consult for:   change to UFH from LMWH Indication: atrial fibrillation  No Known Allergies  Patient Measurements: Height: 6\' 2"  (188 cm) Weight: 212 lb 8.4 oz (96.4 kg) IBW/kg (Calculated) : 82.2    Vital Signs: Temp: 98.3 F (36.8 C) (01/05 0412) Temp src: Oral (01/05 0412) BP: 140/50 mmHg (01/05 0412) Pulse Rate: 57  (01/05 0412)  Labs:  Basename 12/07/12 0740 12/06/12 0650 12/05/12 0440  HGB 9.1* 8.7* --  HCT 28.8* 27.5* 26.3*  PLT 257 228 221  APTT -- -- --  LABPROT 17.1* 18.0* 21.8*  INR 1.43 1.54* 1.99*  HEPARINUNFRC 1.24* -- --  CREATININE 1.91* 1.99* 2.07*  CKTOTAL -- -- --  CKMB -- -- --  TROPONINI -- -- --    Estimated Creatinine Clearance: 38.9 ml/min (by C-G formula based on Cr of 1.91).  Assessment:  Warfarin for afib and PVD PTA - S/p angiogram.   Scheduled for Left BKA and possible R heel debridement Monday 1/6.   Requested to change LMWH to IV heparin in preparation for OR for L BKA Monday.  INR 1.43 after Vitamin K 10 mg IV given 1/3 ~ 1700.  H/H low but stable. No acute need for transfusion.  Previously this admission his heparin level was 0.33 on heparin drip at 1850 units/hr while INR was 2.21.  This morning heparin level is high at 1.24 after heparin drip started at 1850 units/hr.  No bleeding reported.  H/H 9.1/28.8, PLTC 257.  (PTA dose: 5mg  qday except 2.5mg  on Tuesdays and Fridays) .  Goal of Therapy:  Heparin level 0.3 - 0.7 Monitor platelets by anticoagulation protocol: Yes   Plan:  1. Hold heparin drip x 1 hour, decrease rate to 1650 units/hr and check HL in 8 hours 3. Check daily am HL/INR/CBC. Herby Abraham, Pharm.D. [phone] 12/07/2012 10:16 AM

## 2012-12-07 NOTE — Progress Notes (Addendum)
Patient ID: [name], male   DOB: Feb 11, 1935, 78 y.o.   MRN: [id number] Surgery tomorrow at 12 :30PM    Monday   left BKA   .  All questions answered.  May debride his right heel blister as well

## 2012-12-07 NOTE — Progress Notes (Signed)
ANTICOAGULATION CONSULT NOTE - Follow Up Consult  Pharmacy Consult for:   change to UFH from LMWH Indication: atrial fibrillation  No Known Allergies  Patient Measurements: Height: 6\' 2"  (188 cm) Weight: 212 lb 8.4 oz (96.4 kg) IBW/kg (Calculated) : 82.2    Vital Signs: Temp: 98.3 F (36.8 C) (01/05 0412) Temp src: Oral (01/05 0412) BP: 140/50 mmHg (01/05 0412) Pulse Rate: 57  (01/05 0412)  Labs:  Basename 12/07/12 0740 12/06/12 0650 12/05/12 0440  HGB 9.1* 8.7* --  HCT 28.8* 27.5* 26.3*  PLT 257 228 221  APTT -- -- --  LABPROT 17.1* 18.0* 21.8*  INR 1.43 1.54* 1.99*  HEPARINUNFRC 1.24* -- --  CREATININE 1.91* 1.99* 2.07*  CKTOTAL -- -- --  CKMB -- -- --  TROPONINI -- -- --    Estimated Creatinine Clearance: 38.9 ml/min (by C-G formula based on Cr of 1.91).  Assessment:  Has been on Warfarin for afib and PVD PTA - S/p angiogram.  (PTA dose: 5mg  qday except 2.5mg  on Tuesdays and Fridays) .  Scheduled for Left BKA and possible R heel debridement Monday 1/6.    Heparin drip started last evening.  First heparin level = 1.24 which is above goal.  This lab was drawn from the opposite arm from which the heparin is running.   Goal of Therapy:  Heparin level 0.3 - 0.7 Monitor platelets by anticoagulation protocol: Yes   Plan:  1. Hold heparin drip for 1 hours and resume heparin at 1200 units/hr 2. Heparin level is 8 hours. 3. Check daily am HL/INR/CBC.  12/07/2012 10:22 AM

## 2012-12-07 NOTE — Progress Notes (Signed)
ANTICOAGULATION CONSULT NOTE - Follow Up Consult  Pharmacy Consult for:   change to UFH from LMWH Indication: atrial fibrillation  No Known Allergies  Patient Measurements: Height: 6\' 2"  (188 cm) Weight: 212 lb 8.4 oz (96.4 kg) IBW/kg (Calculated) : 82.2    Vital Signs: Temp: 98.6 F (37 C) (01/05 2006) Temp src: Oral (01/05 2006) BP: 133/50 mmHg (01/05 2006) Pulse Rate: 50  (01/05 2006)  Labs:  Basename 12/07/12 1956 12/07/12 0740 12/06/12 0650 12/05/12 0440  HGB -- 9.1* 8.7* --  HCT -- 28.8* 27.5* 26.3*  PLT -- 257 228 221  APTT -- -- -- --  LABPROT -- 17.1* 18.0* 21.8*  INR -- 1.43 1.54* 1.99*  HEPARINUNFRC 0.70 1.24* -- --  CREATININE -- 1.91* 1.99* 2.07*  CKTOTAL -- -- -- --  CKMB -- -- -- --  TROPONINI -- -- -- --    Estimated Creatinine Clearance: 38.9 ml/min (by C-G formula based on Cr of 1.91).  Assessment:  Has been on Warfarin for afib and PVD PTA - S/p angiogram.  (PTA dose: 5mg  qday except 2.5mg  on Tuesdays and Fridays) .  Scheduled for Left BKA and possible R heel debridement Monday 1/6.    Heparin drip started last evening.  First heparin level = 1.24 which is above goal.  This lab was drawn from the opposite arm from which the heparin is running.  Second Heparin level = 0.70 - at high end of therapeutic range   Goal of Therapy:  Heparin level 0.3 - 0.7 Monitor platelets by anticoagulation protocol: Yes   Plan:  1. Decrease heparin to 1100 units / hr 2. Follow up AM labs  Thank you. Okey Regal, PharmD [phone]  12/07/2012 8:32 PM

## 2012-12-07 NOTE — Progress Notes (Addendum)
Subjective: No complaints - just doesn't like the food.  Objective: Vital signs in last 24 hours: Temp:  [98.1 F (36.7 C)-98.4 F (36.9 C)] 98.3 F (36.8 C) (01/05 0412) Pulse Rate:  [51-57] 57  (01/05 0412) Resp:  [18] 18  (01/05 0412) BP: (131-140)/(50-88) 140/50 mmHg (01/05 0412) SpO2:  [95 %-97 %] 97 % (01/05 0412) Weight change:  Last BM Date: 12/06/12 Intake/Output from previous day:-125 01/04 0701 - 01/05 0700 In: 480 [P.O.:480] Out: 800 [Urine:800] Intake/Output this shift:    PE: General:alert and oriented, depressed mood & flat affect. Heart:irreg irreg with pacing Lungs:few crackles but no wheezes, non-labored Abd:+ BS, soft non tender Ext:+ edema, ulcer on rt heel wound care following, boot in place Lt foot amputation on Monday.    Lab Results:  Basename 12/07/12 0740 12/06/12 0650  WBC 9.8 8.6  HGB 9.1* 8.7*  HCT 28.8* 27.5*  PLT 257 228   BMET  Basename 12/07/12 0740 12/06/12 0650  NA 139 139  K 3.8 4.4  CL 106 106  CO2 21 22  GLUCOSE 122* 158*  BUN 38* 40*  CREATININE 1.91* 1.99*  CALCIUM 9.1 8.9   No results found for this basename: TROPONINI:2,CK,MB:2 in the last 72 hours  Lab Results  Component Value Date   CHOL  Value: 115        ATP III CLASSIFICATION:  <200     mg/dL   Desirable  409-811  mg/dL   Borderline High  >=914    mg/dL   High 78/01/9561   HDL 25* 11/07/2007   LDLCALC  Value: 50        Total Cholesterol/HDL:CHD Risk Coronary Heart Disease Risk Table                     Men   Women  1/2 Average Risk   3.4   3.3 11/07/2007   TRIG 198* 11/07/2007   CHOLHDL 4.6 11/07/2007   Lab Results  Component Value Date   HGBA1C 8.4* 11/24/2012     Lab Results  Component Value Date   TSH 1.293 11/24/2012     Studies/Results: No results found.  Medications: I have reviewed the patient's current medications.    Marland Kitchen allopurinol  100 mg Oral Daily  . carvedilol  25 mg Oral BID WC  . hydrALAZINE  25 mg Oral TID  . insulin aspart  0-15  Units Subcutaneous TID WC  . insulin aspart  0-5 Units Subcutaneous QHS  . insulin glargine  12 Units Subcutaneous BID  . isosorbide dinitrate  20 mg Oral TID  . levothyroxine  50 mcg Oral QAC breakfast  . pantoprazole  40 mg Oral Daily  . simvastatin  40 mg Oral QHS   Assessment/Plan: Principal Problem:  *Diabetic foot ulcer - non-healing Active Problems:  Hx of CABG x 6 2003  ICD in place, MDT May 2011  PAF (paroxysmal atrial fibrillation)  Diabetes mellitus type 2, insulin dependent  CKD (chronic kidney disease) stage 4, GFR 15-29 ml/min  Positive Coombs test  Anemia due to chronic illness  HTN (hypertension)  Ischemic cardiomyopathy, EF 25% 2D 12/12  PVD, Lt popliteal HSRA 7/13  Dyslipidemia  Chronic anticoagulation  Non-sustained ventricular tachycardia  Sleep apnea, C-Pap intol  Gout  Hypothyroidism  Chronic systolic CHF (congestive heart failure)  S/P angioplasty, 11/27/12 -Lt below the knee popliteal artery  PLAN:  For surgery on Monday, INR now < 1.5 after Vit K subtherapeutic, Heparin IV per Pharmacy.  Will need to re-start Warfarin tomorrow post-op Continues to be anemic -- progressively better H/H- no acute need for transfusion.  No active CHF/CAD concerns.  Just awaiting surgery - ? BKA on Monday.  DM control is relatively good -  Increased Lantus coverage as CBGs are up a bit. BP & HR stable on BB, Hydralazine/Nitrate Renal function stable to improved On statin On synthroid for hypothyroidism PPI for GI prophylaxis Now on IV Heparin for Afib/PE  VTE prophylaxis.  Will need to c/s SW for probable SNF placement for post-op Rehab   LOS: 13 days   HARDING,DAVID W 12/07/2012, 12:24 PM

## 2012-12-08 ENCOUNTER — Inpatient Hospital Stay (HOSPITAL_COMMUNITY): Payer: Medicare Other | Admitting: Anesthesiology

## 2012-12-08 ENCOUNTER — Encounter (HOSPITAL_COMMUNITY): Payer: Self-pay | Admitting: Anesthesiology

## 2012-12-08 ENCOUNTER — Encounter (HOSPITAL_COMMUNITY)
Admission: AD | Disposition: A | Discharge status: Rehabilitation Facility | Payer: Self-pay | Source: Ambulatory Visit | Attending: Cardiovascular Disease

## 2012-12-08 HISTORY — PX: AMPUTATION: SHX166

## 2012-12-08 LAB — BASIC METABOLIC PANEL
CO2: 21 mEq/L (ref 19–32)
Calcium: 9.1 mg/dL (ref 8.4–10.5)
GFR calc non Af Amer: 32 mL/min — ABNORMAL LOW (ref >90)
Sodium: 136 mEq/L (ref 135–145)

## 2012-12-08 LAB — TYPE AND SCREEN
ABO/RH(D): A POS
Antibody Screen: NEGATIVE

## 2012-12-08 LAB — GLUCOSE, CAPILLARY
Glucose-Capillary: 99 mg/dL (ref 70–99)
Glucose-Capillary: 104 mg/dL — ABNORMAL HIGH (ref 70–99)

## 2012-12-08 LAB — CBC
HCT: 26.8 % — ABNORMAL LOW (ref 39.0–52.0)
Platelets: 218 10*3/uL (ref 150–400)
RDW: 17.8 % — ABNORMAL HIGH (ref 11.5–15.5)
WBC: 7.9 10*3/uL (ref 4.0–10.5)

## 2012-12-08 LAB — PROTIME-INR: INR: 1.31 (ref 0.00–1.49)

## 2012-12-08 LAB — HEPARIN LEVEL (UNFRACTIONATED): Heparin Unfractionated: 0.36 IU/mL (ref 0.30–0.70)

## 2012-12-08 SURGERY — AMPUTATION BELOW KNEE
Anesthesia: General | Site: Leg Lower | Laterality: Left

## 2012-12-08 MED ORDER — METHOCARBAMOL 500 MG PO TABS
500.0000 mg | ORAL_TABLET | Freq: Four times a day (QID) | ORAL | Status: DC | PRN
  Administered 2012-12-09 – 2012-12-10 (×3): 500 mg via ORAL
  Filled 2012-12-08 (×3): qty 1

## 2012-12-08 MED ORDER — MIDAZOLAM HCL 2 MG/2ML IJ SOLN: 0.5000 mg | Freq: Once | INTRAMUSCULAR | Status: AC | PRN

## 2012-12-08 MED ORDER — METOCLOPRAMIDE HCL 5 MG/ML IJ SOLN
5.0000 mg | Freq: Three times a day (TID) | INTRAMUSCULAR | Status: DC | PRN
  Filled 2012-12-08: qty 2

## 2012-12-08 MED ORDER — PROMETHAZINE HCL 25 MG/ML IJ SOLN: 6.2500 mg | INTRAMUSCULAR | Status: DC | PRN

## 2012-12-08 MED ORDER — LACTATED RINGERS IV SOLN
INTRAVENOUS | Status: DC | PRN
  Administered 2012-12-08: 12:00:00 via INTRAVENOUS

## 2012-12-08 MED ORDER — PHENYLEPHRINE HCL 10 MG/ML IJ SOLN
INTRAMUSCULAR | Status: DC | PRN
  Administered 2012-12-08 (×2): 40 ug via INTRAVENOUS
  Administered 2012-12-08: 80 ug via INTRAVENOUS

## 2012-12-08 MED ORDER — HYDROMORPHONE HCL PF 1 MG/ML IJ SOLN
0.5000 mg | INTRAMUSCULAR | Status: DC | PRN
  Administered 2012-12-08 – 2012-12-10 (×8): 1 mg via INTRAVENOUS
  Filled 2012-12-08 (×9): qty 1

## 2012-12-08 MED ORDER — OXYCODONE-ACETAMINOPHEN 5-325 MG PO TABS
1.0000 | ORAL_TABLET | ORAL | Status: DC | PRN
  Administered 2012-12-09 – 2012-12-11 (×9): 2 via ORAL
  Filled 2012-12-08 (×9): qty 2

## 2012-12-08 MED ORDER — CEFAZOLIN SODIUM-DEXTROSE 2-3 GM-% IV SOLR
2.0000 g | Freq: Four times a day (QID) | INTRAVENOUS | Status: AC
  Administered 2012-12-08 – 2012-12-09 (×2): 2 g via INTRAVENOUS
  Filled 2012-12-08 (×3): qty 50

## 2012-12-08 MED ORDER — WARFARIN - PHARMACIST DOSING INPATIENT
Freq: Every day | Status: DC
  Administered 2012-12-09: 18:00:00

## 2012-12-08 MED ORDER — DEXTROSE 5 % IV SOLN
500.0000 mg | Freq: Four times a day (QID) | INTRAVENOUS | Status: DC | PRN
  Filled 2012-12-08: qty 5

## 2012-12-08 MED ORDER — OXYCODONE HCL 5 MG/5ML PO SOLN: 5.0000 mg | Freq: Once | ORAL | Status: DC | PRN

## 2012-12-08 MED ORDER — CEFAZOLIN SODIUM 1-5 GM-% IV SOLN
INTRAVENOUS | Status: AC
  Filled 2012-12-08: qty 100

## 2012-12-08 MED ORDER — ONDANSETRON HCL 4 MG/2ML IJ SOLN
INTRAMUSCULAR | Status: DC | PRN
  Administered 2012-12-08: 4 mg via INTRAVENOUS

## 2012-12-08 MED ORDER — PROPOFOL 10 MG/ML IV BOLUS
INTRAVENOUS | Status: DC | PRN
  Administered 2012-12-08: 100 mg via INTRAVENOUS

## 2012-12-08 MED ORDER — MEPERIDINE HCL 25 MG/ML IJ SOLN: 6.2500 mg | INTRAMUSCULAR | Status: DC | PRN

## 2012-12-08 MED ORDER — OXYCODONE HCL 5 MG PO TABS: 5.0000 mg | ORAL_TABLET | Freq: Once | ORAL | Status: DC | PRN

## 2012-12-08 MED ORDER — POLYETHYLENE GLYCOL 3350 17 G PO PACK
17.0000 g | PACK | Freq: Every day | ORAL | Status: DC | PRN
  Administered 2012-12-10: 17 g via ORAL
  Filled 2012-12-08: qty 1

## 2012-12-08 MED ORDER — LIDOCAINE HCL (CARDIAC) 20 MG/ML IV SOLN
INTRAVENOUS | Status: DC | PRN
  Administered 2012-12-08: 20 mg via INTRAVENOUS

## 2012-12-08 MED ORDER — WARFARIN SODIUM 5 MG PO TABS
5.0000 mg | ORAL_TABLET | Freq: Once | ORAL | Status: AC
  Filled 2012-12-08: qty 1

## 2012-12-08 MED ORDER — CEFAZOLIN SODIUM 1-5 GM-% IV SOLN
1.0000 g | Freq: Three times a day (TID) | INTRAVENOUS | Status: DC
  Administered 2012-12-08: 2 g via INTRAVENOUS
  Filled 2012-12-08 (×4): qty 50

## 2012-12-08 MED ORDER — HYDROMORPHONE HCL PF 1 MG/ML IJ SOLN
INTRAMUSCULAR | Status: AC
  Filled 2012-12-08: qty 1

## 2012-12-08 MED ORDER — 0.9 % SODIUM CHLORIDE (POUR BTL) OPTIME
TOPICAL | Status: DC | PRN
  Administered 2012-12-08: 1000 mL

## 2012-12-08 MED ORDER — ARTIFICIAL TEARS OP OINT
TOPICAL_OINTMENT | OPHTHALMIC | Status: DC | PRN
  Administered 2012-12-08: 1 via OPHTHALMIC

## 2012-12-08 MED ORDER — SODIUM CHLORIDE 0.45 % IV SOLN
INTRAVENOUS | Status: DC
  Administered 2012-12-09: 03:00:00 via INTRAVENOUS

## 2012-12-08 MED ORDER — FENTANYL CITRATE 0.05 MG/ML IJ SOLN
INTRAMUSCULAR | Status: DC | PRN
  Administered 2012-12-08: 25 ug via INTRAVENOUS
  Administered 2012-12-08: 50 ug via INTRAVENOUS
  Administered 2012-12-08 (×2): 25 ug via INTRAVENOUS
  Administered 2012-12-08: 50 ug via INTRAVENOUS
  Administered 2012-12-08 (×2): 25 ug via INTRAVENOUS
  Administered 2012-12-08: 125 ug via INTRAVENOUS

## 2012-12-08 MED ORDER — METOCLOPRAMIDE HCL 5 MG PO TABS
5.0000 mg | ORAL_TABLET | Freq: Three times a day (TID) | ORAL | Status: DC | PRN
  Filled 2012-12-08: qty 2

## 2012-12-08 MED ORDER — ONDANSETRON HCL 4 MG PO TABS: 4.0000 mg | ORAL_TABLET | Freq: Four times a day (QID) | ORAL | Status: DC | PRN

## 2012-12-08 MED ORDER — ONDANSETRON HCL 4 MG/2ML IJ SOLN: 4.0000 mg | Freq: Four times a day (QID) | INTRAMUSCULAR | Status: DC | PRN

## 2012-12-08 MED ORDER — HYDROMORPHONE HCL PF 1 MG/ML IJ SOLN
0.2500 mg | INTRAMUSCULAR | Status: DC | PRN
  Administered 2012-12-08 (×6): 0.5 mg via INTRAVENOUS

## 2012-12-08 MED ORDER — FLEET ENEMA 7-19 GM/118ML RE ENEM
1.0000 | ENEMA | Freq: Once | RECTAL | Status: AC | PRN
  Filled 2012-12-08: qty 1

## 2012-12-08 SURGICAL SUPPLY — 60 items
BANDAGE ELASTIC 4 VELCRO ST LF (GAUZE/BANDAGES/DRESSINGS) ×2 IMPLANT
BANDAGE ELASTIC 6 VELCRO ST LF (GAUZE/BANDAGES/DRESSINGS) ×2 IMPLANT
BANDAGE ESMARK 6X9 LF (GAUZE/BANDAGES/DRESSINGS) IMPLANT
BANDAGE GAUZE ELAST BULKY 4 IN (GAUZE/BANDAGES/DRESSINGS) ×3 IMPLANT
BLADE SAW SAG 29X58X.64 (BLADE) ×2 IMPLANT
BNDG CMPR 9X6 STRL LF SNTH (GAUZE/BANDAGES/DRESSINGS) ×1
BNDG COHESIVE 6X5 TAN STRL LF (GAUZE/BANDAGES/DRESSINGS) ×2 IMPLANT
BNDG ESMARK 6X9 LF (GAUZE/BANDAGES/DRESSINGS) ×2
CHLORAPREP W/TINT 26ML (MISCELLANEOUS) ×1 IMPLANT
CLEANER TIP ELECTROSURG 2X2 (MISCELLANEOUS) ×1 IMPLANT
CLOTH BEACON ORANGE TIMEOUT ST (SAFETY) ×2 IMPLANT
CORDS BIPOLAR (ELECTRODE) ×2 IMPLANT
COVER SURGICAL LIGHT HANDLE (MISCELLANEOUS) ×2 IMPLANT
CUFF TOURNIQUET SINGLE 34IN LL (TOURNIQUET CUFF) ×1 IMPLANT
CUFF TOURNIQUET SINGLE 44IN (TOURNIQUET CUFF) IMPLANT
DRAIN PENROSE 1/2X12 LTX STRL (WOUND CARE) IMPLANT
DRAPE EXTREMITY T 121X128X90 (DRAPE) ×2 IMPLANT
DRAPE INCISE IOBAN 66X45 STRL (DRAPES) ×1 IMPLANT
DRAPE PROXIMA HALF (DRAPES) ×4 IMPLANT
DRAPE U-SHAPE 47X51 STRL (DRAPES) ×2 IMPLANT
DRSG PAD ABDOMINAL 8X10 ST (GAUZE/BANDAGES/DRESSINGS) ×2 IMPLANT
DURAPREP 26ML APPLICATOR (WOUND CARE) ×1 IMPLANT
EVACUATOR 1/8 PVC DRAIN (DRAIN) IMPLANT
GAUZE XEROFORM 5X9 LF (GAUZE/BANDAGES/DRESSINGS) ×2 IMPLANT
GLOVE BIOGEL PI IND STRL 7.5 (GLOVE) ×1 IMPLANT
GLOVE BIOGEL PI IND STRL 8 (GLOVE) ×1 IMPLANT
GLOVE BIOGEL PI INDICATOR 7.5 (GLOVE) ×1
GLOVE BIOGEL PI INDICATOR 8 (GLOVE) ×1
GLOVE ECLIPSE 7.0 STRL STRAW (GLOVE) ×2 IMPLANT
GLOVE ORTHO TXT STRL SZ7.5 (GLOVE) ×2 IMPLANT
GOWN PREVENTION PLUS LG XLONG (DISPOSABLE) IMPLANT
GOWN PREVENTION PLUS XLARGE (GOWN DISPOSABLE) ×2 IMPLANT
GOWN STRL NON-REIN LRG LVL3 (GOWN DISPOSABLE) ×2 IMPLANT
KIT BASIN OR (CUSTOM PROCEDURE TRAY) ×2 IMPLANT
KIT ROOM TURNOVER OR (KITS) ×2 IMPLANT
MANIFOLD NEPTUNE II (INSTRUMENTS) ×2 IMPLANT
NDL MAYO TROCAR (NEEDLE) ×1 IMPLANT
NEEDLE MAYO TROCAR (NEEDLE) IMPLANT
NS IRRIG 1000ML POUR BTL (IV SOLUTION) ×2 IMPLANT
PACK GENERAL/GYN (CUSTOM PROCEDURE TRAY) ×2 IMPLANT
PAD ARMBOARD 7.5X6 YLW CONV (MISCELLANEOUS) ×4 IMPLANT
PAD DEFIB STAT PADZ MULTI (MISCELLANEOUS) ×1 IMPLANT
SPONGE GAUZE 4X4 12PLY (GAUZE/BANDAGES/DRESSINGS) ×2 IMPLANT
SPONGE LAP 18X18 X RAY DECT (DISPOSABLE) ×1 IMPLANT
STAPLER VISISTAT 35W (STAPLE) ×2 IMPLANT
STOCKINETTE IMPERVIOUS LG (DRAPES) ×2 IMPLANT
SUT ETHILON 2 0 FS 18 (SUTURE) ×5 IMPLANT
SUT SILK 3 0 SH CR/8 (SUTURE) ×2 IMPLANT
SUT VIC AB 0 CT1 27 (SUTURE) ×2
SUT VIC AB 0 CT1 27XBRD ANBCTR (SUTURE) ×1 IMPLANT
SUT VIC AB 1 CT1 27 (SUTURE) ×2
SUT VIC AB 1 CT1 27XBRD ANBCTR (SUTURE) IMPLANT
SUT VIC AB 2-0 CT1 18 (SUTURE) ×1 IMPLANT
SUT VIC AB 2-0 CT1 27 (SUTURE) ×2
SUT VIC AB 2-0 CT1 TAPERPNT 27 (SUTURE) IMPLANT
SUT VICRYL 0 TIES 12 18 (SUTURE) ×2 IMPLANT
SUT VICRYL AB 2 0 TIES (SUTURE) ×2 IMPLANT
TOWEL OR 17X24 6PK STRL BLUE (TOWEL DISPOSABLE) ×2 IMPLANT
TOWEL OR 17X26 10 PK STRL BLUE (TOWEL DISPOSABLE) ×2 IMPLANT
WATER STERILE IRR 1000ML POUR (IV SOLUTION) ×2 IMPLANT

## 2012-12-08 NOTE — Brief Op Note (Signed)
11/24/2012 - 12/08/2012  2:17 PM  PATIENT:  [name]  78 y.o. male  PRE-OPERATIVE DIAGNOSIS:  nonhealing diabetic ulcer of left foot and ankle  POST-OPERATIVE DIAGNOSIS:  nonhealing diabetic ulcer of left foot and ankle  PROCEDURE:  Procedure(s) (LRB) with comments: AMPUTATION BELOW KNEE (Left)  SURGEON:  Surgeon(s) and Role:    * Eldred Manges, MD - Primary  PHYSICIAN ASSISTANT:   ASSISTANTS: none   ANESTHESIA:   general  EBL:  Total I/O In: 700 [I.V.:700] Out: 400 [Urine:300; Blood:100]  BLOOD ADMINISTERED:none  DRAINS: none   LOCAL MEDICATIONS USED:  NONE  SPECIMEN:  Source of Specimen:  BKA left   DISPOSITION OF SPECIMEN:  PATHOLOGY  COUNTS:  YES  TOURNIQUET:   Total Tourniquet Time Documented: Thigh (Left) - 23 minutes  DICTATION: .Other Dictation: Dictation Number [id number]  PLAN OF CARE: still inpatient  PATIENT DISPOSITION:  PACU - hemodynamically stable.   Delay start of Pharmacological VTE agent (>24hrs) due to surgical blood loss or risk of bleeding: yes

## 2012-12-08 NOTE — Anesthesia Preprocedure Evaluation (Addendum)
Anesthesia Evaluation  Patient identified by MRN, date of birth, ID band Patient awake    Reviewed: Allergy & Precautions, H&P , NPO status , Patient's Chart, lab work & pertinent test results, reviewed documented beta blocker date and time   History of Anesthesia Complications Negative for: history of anesthetic complications  Airway Mallampati: II TM Distance: >3 FB Neck ROM: Full    Dental  (+) Edentulous Upper and Partial Lower   Pulmonary sleep apnea and Oxygen sleep apnea , COPDformer smoker,  breath sounds clear to auscultation  Pulmonary exam normal       Cardiovascular hypertension, Pt. on medications and Pt. on home beta blockers + CAD, + CABG and + Peripheral Vascular Disease + dysrhythmias (coumadin, INR 1.31) Atrial Fibrillation + Cardiac Defibrillator Rhythm:Irregular Rate:Normal  12/12 ECHO: EF 25-30%, mild MR   Neuro/Psych negative neurological ROS     GI/Hepatic GERD-  Controlled,  Endo/Other  diabetes (glu 99), Well Controlled, Type 2, Insulin Dependent and Oral Hypoglycemic AgentsHypothyroidism (on replacement)   Renal/GU Renal InsufficiencyRenal disease (creat 1.92, K+ 3.9)     Musculoskeletal   Abdominal   Peds  Hematology  (+) Blood dyscrasia, anemia ,   Anesthesia Other Findings   Reproductive/Obstetrics                          Anesthesia Physical Anesthesia Plan  ASA: III  Anesthesia Plan: General   Post-op Pain Management:    Induction: Intravenous  Airway Management Planned: LMA  Additional Equipment:   Intra-op Plan:   Post-operative Plan:   Informed Consent: I have reviewed the patients History and Physical, chart, labs and discussed the procedure including the risks, benefits and alternatives for the proposed anesthesia with the patient or authorized representative who has indicated his/her understanding and acceptance.   Dental advisory given  Plan  Discussed with: CRNA and Surgeon  Anesthesia Plan Comments: (Plan routine monitors, GA- LMA OK)        Anesthesia Quick Evaluation

## 2012-12-08 NOTE — Progress Notes (Signed)
ANTICOAGULATION CONSULT NOTE   Pharmacy Consult for coumadin Indication: afib  Allergies  Allergen Reactions  . Iodine Solution (Povidone Iodine) Rash    Topical only    Patient Measurements: Height: 6\' 2"  (188 cm) Weight: 212 lb 8.4 oz (96.4 kg) IBW/kg (Calculated) : 82.2   Vital Signs: Temp: 97.3 F (36.3 C) (01/06 1701) Temp src: Oral (01/06 1701) BP: 141/65 mmHg (01/06 1701) Pulse Rate: 57  (01/06 1701)  Labs:  Basename 12/08/12 0458 12/07/12 1956 12/07/12 0740 12/06/12 0650  HGB 8.4* -- 9.1* --  HCT 26.8* -- 28.8* 27.5*  PLT 218 -- 257 228  APTT -- -- -- --  LABPROT 16.0* -- 17.1* 18.0*  INR 1.31 -- 1.43 1.54*  HEPARINUNFRC 0.36 0.70 1.24* --  CREATININE 1.92* -- 1.91* 1.99*  CKTOTAL -- -- -- --  CKMB -- -- -- --  TROPONINI -- -- -- --    Estimated Creatinine Clearance: 38.7 ml/min (by C-G formula based on Cr of 1.92).    Medications:  Scheduled:    . allopurinol  100 mg Oral Daily  . carvedilol  25 mg Oral BID WC  . ceFAZolin  1 g Intravenous Q8H  .  ceFAZolin (ANCEF) IV  2 g Intravenous Q6H  . hydrALAZINE  25 mg Oral TID  . HYDROmorphone      . HYDROmorphone      . HYDROmorphone      . insulin aspart  0-15 Units Subcutaneous TID WC  . insulin aspart  0-5 Units Subcutaneous QHS  . insulin glargine  12 Units Subcutaneous BID  . isosorbide dinitrate  20 mg Oral TID  . levothyroxine  50 mcg Oral QAC breakfast  . pantoprazole  40 mg Oral Daily  . simvastatin  40 mg Oral QHS    Assessment: 78 yo male on coumadin PTA for afib and noted s/p L BKA. Patient to restart coumadin tonight, current INR= 1.31  Home coumadin dose:  5mg  qday except 2.5mg  on Tuesdays and Fridays   Goal of Therapy:  INR 2-3 Monitor platelets by anticoagulation protocol: Yes   Plan:  -Coumadin 5mg  po today -Daily PT/INR -Consider restarting heparin 24hrs post surgery  Harland German, Pharm D 12/08/2012 5:15 PM

## 2012-12-08 NOTE — Progress Notes (Signed)
PT Cancellation Note  Patient Details Name: RYLYNN SCHONEMAN MRN: [id number] DOB: 12-11-1934   Cancelled Treatment:    Reason Eval/Treat Not Completed: Other (comment) (L BKA scheduled today)   Yosgart Pavey,KATHrine E 12/08/2012, 9:26 AM Pager: [phone]

## 2012-12-08 NOTE — Preoperative (Signed)
Beta Blockers   Reason not to administer Beta Blockers:Not Applicable 

## 2012-12-08 NOTE — Transfer of Care (Signed)
Immediate Anesthesia Transfer of Care Note  Patient: [name]  Procedure(s) Performed: Procedure(s) (LRB) with comments: AMPUTATION BELOW KNEE (Left)  Patient Location: PACU  Anesthesia Type:General  Level of Consciousness: awake, alert  and oriented  Airway & Oxygen Therapy: Patient Spontanous Breathing and Patient connected to nasal cannula oxygen  Post-op Assessment: Report given to PACU RN and Post -op Vital signs reviewed and stable  Post vital signs: Reviewed and stable  Complications: No apparent anesthesia complications

## 2012-12-08 NOTE — Anesthesia Postprocedure Evaluation (Signed)
  Anesthesia Post-op Note  Patient: [name]  Procedure(s) Performed: Procedure(s) (LRB) with comments: AMPUTATION BELOW KNEE (Left)  Patient Location: PACU  Anesthesia Type:General  Level of Consciousness: awake and alert   Airway and Oxygen Therapy: Patient Spontanous Breathing  Post-op Pain: mild  Post-op Assessment: Post-op Vital signs reviewed, Patient's Cardiovascular Status Stable, Respiratory Function Stable, Patent Airway, No signs of Nausea or vomiting and Pain level controlled  Post-op Vital Signs: stable  Complications: No apparent anesthesia complications

## 2012-12-08 NOTE — H&P (View-Only) (Signed)
Patient ID: [name], male   DOB: 05/16/1935, 77 y.o.   MRN: [id number] Surgery tomorrow at 12 :30PM    Monday   left BKA   .  All questions answered.  May debride his right heel blister as well 

## 2012-12-08 NOTE — Anesthesia Procedure Notes (Signed)
Procedure Name: LMA Insertion Date/Time: 12/08/2012 12:42 PM Performed by: Gayla Medicus Pre-anesthesia Checklist: Patient identified, Timeout performed, Emergency Drugs available, Suction available and Patient being monitored Patient Re-evaluated:Patient Re-evaluated prior to inductionOxygen Delivery Method: Circle system utilized Preoxygenation: Pre-oxygenation with 100% oxygen Intubation Type: IV induction LMA: LMA inserted LMA Size: 4.0 Number of attempts: 1 Placement Confirmation: positive ETCO2 and breath sounds checked- equal and bilateral Tube secured with: Tape Dental Injury: Teeth and Oropharynx as per pre-operative assessment

## 2012-12-08 NOTE — Progress Notes (Signed)
ANTICOAGULATION CONSULT NOTE - Follow Up Consult  Pharmacy Consult for heparin Indication: atrial fibrillation  No Known Allergies  Patient Measurements: Height: 6\' 2"  (188 cm) Weight: 212 lb 8.4 oz (96.4 kg) IBW/kg (Calculated) : 82.2    Vital Signs: Temp: 97.9 F (36.6 C) (01/06 0500) Temp src: Oral (01/06 0500) BP: 125/60 mmHg (01/06 0500) Pulse Rate: 51  (01/06 0500)  Labs:  Basename 12/08/12 0458 12/07/12 1956 12/07/12 0740 12/06/12 0650  HGB 8.4* -- 9.1* --  HCT 26.8* -- 28.8* 27.5*  PLT 218 -- 257 228  APTT -- -- -- --  LABPROT 16.0* -- 17.1* 18.0*  INR 1.31 -- 1.43 1.54*  HEPARINUNFRC 0.36 0.70 1.24* --  CREATININE 1.92* -- 1.91* 1.99*  CKTOTAL -- -- -- --  CKMB -- -- -- --  TROPONINI -- -- -- --    Estimated Creatinine Clearance: 38.7 ml/min (by C-G formula based on Cr of 1.92).  Assessment: Patient is a 78 y.o M on heparin for afib and PVD with plan for left BKA today.  Coumadin currently on hold for surgical procedure.  Heparin level is at goal at 0.36.  No bleeding noted.  Goal of Therapy:  Heparin level 0.3-0.7 units/ml Monitor platelets by anticoagulation protocol: Yes   Plan:  1) continue current heparin regimen. 2) will f/u with plan for anticoagulation after procedure today  Tanae Petrosky P 12/08/2012,10:13 AM

## 2012-12-08 NOTE — Interval H&P Note (Signed)
History and Physical Interval Note:  12/08/2012 2:17 PM  [name]  has presented today for surgery, with the diagnosis of nonhealing diabetic ulcer of left foot and ankle  The various methods of treatment have been discussed with the patient and family. After consideration of risks, benefits and other options for treatment, the patient has consented to  Procedure(s) (LRB) with comments: AMPUTATION BELOW KNEE (Left) as a surgical intervention .  The patient's history has been reviewed, patient examined, no change in status, stable for surgery.  I have reviewed the patient's chart and labs.  Questions were answered to the patient's satisfaction.     [name]

## 2012-12-09 ENCOUNTER — Encounter (HOSPITAL_COMMUNITY): Payer: Self-pay | Admitting: Orthopaedic Surgery

## 2012-12-09 LAB — BASIC METABOLIC PANEL
BUN: 31 mg/dL — ABNORMAL HIGH (ref 6–23)
CO2: 20 mEq/L (ref 19–32)
Calcium: 8.9 mg/dL (ref 8.4–10.5)
Creatinine, Ser: 1.73 mg/dL — ABNORMAL HIGH (ref 0.50–1.35)
GFR calc non Af Amer: 37 mL/min — ABNORMAL LOW (ref >90)
Glucose, Bld: 160 mg/dL — ABNORMAL HIGH (ref 70–99)
Sodium: 139 mEq/L (ref 135–145)

## 2012-12-09 LAB — CBC
HCT: 27 % — ABNORMAL LOW (ref 39.0–52.0)
MCH: 27.5 pg (ref 26.0–34.0)
MCHC: 31.1 g/dL (ref 30.0–36.0)
MCV: 88.2 fL (ref 78.0–100.0)
Platelets: 207 10*3/uL (ref 150–400)
RDW: 17.9 % — ABNORMAL HIGH (ref 11.5–15.5)
WBC: 10 10*3/uL (ref 4.0–10.5)

## 2012-12-09 LAB — GLUCOSE, CAPILLARY: Glucose-Capillary: 169 mg/dL — ABNORMAL HIGH (ref 70–99)

## 2012-12-09 MED ORDER — WARFARIN SODIUM 5 MG PO TABS
5.0000 mg | ORAL_TABLET | Freq: Once | ORAL | Status: AC
  Administered 2012-12-09: 5 mg via ORAL
  Filled 2012-12-09: qty 1

## 2012-12-09 MED ORDER — HEPARIN (PORCINE) IN NACL 100-0.45 UNIT/ML-% IJ SOLN
1200.0000 [IU]/h | INTRAMUSCULAR | Status: DC
  Administered 2012-12-09: 1100 [IU]/h via INTRAVENOUS
  Filled 2012-12-09 (×2): qty 250

## 2012-12-09 NOTE — Progress Notes (Signed)
ANTICOAGULATION CONSULT NOTE - Follow Up Consult  Pharmacy Consult for coumadin Indication: atrial fibrillation  Allergies  Allergen Reactions  . Iodine Solution (Povidone Iodine) Rash    Topical only    Patient Measurements: Height: 6\' 2"  (188 cm) Weight: 212 lb 8.4 oz (96.4 kg) IBW/kg (Calculated) : 82.2    Vital Signs: Temp: 97.8 F (36.6 C) (01/07 0500) Temp src: Oral (01/07 0500) BP: 135/55 mmHg (01/07 0500) Pulse Rate: 66  (01/07 0959)  Labs:  Basename 12/09/12 0435 12/08/12 0458 12/07/12 1956 12/07/12 0740  HGB 8.4* 8.4* -- --  HCT 27.0* 26.8* -- 28.8*  PLT 207 218 -- 257  APTT -- -- -- --  LABPROT 16.3* 16.0* -- 17.1*  INR 1.34 1.31 -- 1.43  HEPARINUNFRC -- 0.36 0.70 1.24*  CREATININE 1.73* 1.92* -- 1.91*  CKTOTAL -- -- -- --  CKMB -- -- -- --  TROPONINI -- -- -- --    Estimated Creatinine Clearance: 42.9 ml/min (by C-G formula based on Cr of 1.73).  Assessment: Patient is a 78 y.o M on coumadin for Afib and PVD.  Heparin d/ced yesterday prior to left BKA procedure and coumadin resumed after procedure. Hgb is low but stable at 8.4.  No bleeding noted. Coumadin 5mg  dose ordered yesterday but was not given. INR is 1.34.    Goal of Therapy:  INR 2-3    Plan:  1) coumadin 5mg  PO x1 2) please advise if/when you want to resume heparin therapy for patient  Lucia Gaskins 12/09/2012,11:15 AM

## 2012-12-09 NOTE — Progress Notes (Signed)
Subjective: 1 Day Post-Op Procedure(s) (LRB): AMPUTATION BELOW KNEE (Left) Patient reports pain as moderate.   Pt states he feels better.  Notes his doesn't have chills any more since surgery. Objective: Vital signs in last 24 hours: Temp:  [97 F (36.1 C)-97.8 F (36.6 C)] 97.8 F (36.6 C) (01/07 0500) Pulse Rate:  [45-66] 66  (01/07 0500) Resp:  [18] 18  (01/07 0500) BP: (135-169)/(55-93) 135/55 mmHg (01/07 0500) SpO2:  [94 %-100 %] 98 % (01/07 0500)  Intake/Output from previous day: 01/06 0701 - 01/07 0700 In: 1570 [P.O.:720; I.V.:800; IV Piggyback:50] Out: 800 [Urine:700; Blood:100] Intake/Output this shift:     Basename 12/09/12 0435 12/08/12 0458 12/07/12 0740  HGB 8.4* 8.4* 9.1*    Basename 12/09/12 0435 12/08/12 0458  WBC 10.0 7.9  RBC 3.06* 3.05*  HCT 27.0* 26.8*  PLT 207 218    Basename 12/09/12 0435 12/08/12 0458  NA 139 136  K 4.9 3.9  CL 109 107  CO2 20 21  BUN 31* 35*  CREATININE 1.73* 1.92*  GLUCOSE 160* 96  CALCIUM 8.9 9.1    Basename 12/09/12 0435 12/08/12 0458  LABPT -- --  INR 1.34 1.31    leg elevated and dressing intact.  Assessment/Plan: 1 Day Post-Op Procedure(s) (LRB): AMPUTATION BELOW KNEE (Left) Up with therapy Will need rehab, ? inpt rehab here.  Stella Bortle M 12/09/2012, 9:45 AM

## 2012-12-09 NOTE — Progress Notes (Signed)
[facility]  S/P Left BKA, due to nonhealing diabetic ulceration of left foot and ankle POD#2  Subjective: Reports LLE pain. No other complaints. Objective: Vital signs in last 24 hours: Temp:  [97 F (36.1 C)-97.8 F (36.6 C)] 97.8 F (36.6 C) (01/07 0500) Pulse Rate:  [45-66] 66  (01/07 0500) Resp:  [18] 18  (01/07 0500) BP: (135-169)/(55-93) 135/55 mmHg (01/07 0500) SpO2:  [94 %-100 %] 98 % (01/07 0500) Last BM Date: 12/07/12  Intake/Output from previous day: 01/06 0701 - 01/07 0700 In: 1570 [P.O.:720; I.V.:800; IV Piggyback:50] Out: 800 [Urine:700; Blood:100] Intake/Output this shift:    Medications Current Facility-Administered Medications  Medication Dose Route Frequency Provider Last Rate Last Dose  . 0.45 % sodium chloride infusion   Intravenous Continuous Eldred Manges, MD 50 mL/hr at 12/09/12 0301    . acetaminophen (TYLENOL) tablet 650 mg  650 mg Oral Q4H PRN Abelino Derrick, PA      . acetaminophen (TYLENOL) tablet 650 mg  650 mg Oral Q4H PRN Runell Gess, MD      . allopurinol (ZYLOPRIM) tablet 100 mg  100 mg Oral Daily Eda Paschal [place], [place]   100 mg at 12/08/12 1042  . carvedilol (COREG) tablet 25 mg  25 mg Oral BID WC Eda Paschal [place], PA   25 mg at 12/08/12 0636  . ceFAZolin (ANCEF) IVPB 2 g/50 mL premix  2 g Intravenous Q6H Eldred Manges, MD   2 g at 12/09/12 0513  . heparin ADULT infusion 100 units/mL (25000 units/250 mL)  1,100 Units/hr Intravenous Continuous Runell Gess, MD 11 mL/hr at 12/07/12 2034 1,100 Units/hr at 12/07/12 2034  . hydrALAZINE (APRESOLINE) tablet 25 mg  25 mg Oral TID Abelino Derrick, PA   25 mg at 12/08/12 2201  . HYDROmorphone (DILAUDID) injection 0.25-0.5 mg  0.25-0.5 mg Intravenous Q5 min PRN E. Jairo Ben, MD   0.5 mg at 12/08/12 1627  . HYDROmorphone (DILAUDID) injection 0.5-1 mg  0.5-1 mg Intravenous Q2H PRN Eldred Manges, MD   1 mg at 12/09/12 0541  . insulin aspart (novoLOG) injection 0-15 Units   0-15 Units Subcutaneous TID WC Eda Paschal [place], PA   3 Units at 12/07/12 1630  . insulin aspart (novoLOG) injection 0-5 Units  0-5 Units Subcutaneous QHS Eda Paschal [place], [place]   2 Units at 11/30/12 2203  . insulin glargine (LANTUS) injection 12 Units  12 Units Subcutaneous BID Marykay Lex, MD   12 Units at 12/08/12 2203  . isosorbide dinitrate (ISORDIL) tablet 20 mg  20 mg Oral TID Abelino Derrick, PA   20 mg at 12/08/12 2202  . levothyroxine (SYNTHROID, LEVOTHROID) tablet 50 mcg  50 mcg Oral QAC breakfast Eda Paschal [place], [place]   50 mcg at 12/09/12 0541  . meperidine (DEMEROL) injection 6.25-12.5 mg  6.25-12.5 mg Intravenous Q5 min PRN E. Jairo Ben, MD      . methocarbamol (ROBAXIN) tablet 500 mg  500 mg Oral Q6H PRN Eldred Manges, MD       Or  . methocarbamol (ROBAXIN) 500 mg in dextrose 5 % 50 mL IVPB  500 mg Intravenous Q6H PRN Eldred Manges, MD      . metoCLOPramide (REGLAN) tablet 5-10 mg  5-10 mg Oral Q8H PRN Eldred Manges, MD       Or  . metoCLOPramide (REGLAN) injection 5-10 mg  5-10 mg Intravenous Q8H PRN Eldred Manges, MD      .  morphine 2 MG/ML injection 2 mg  2 mg Intravenous Q1H PRN Runell Gess, MD   2 mg at 11/27/12 2045  . ondansetron (ZOFRAN) injection 4 mg  4 mg Intravenous Q6H PRN Abelino Derrick, PA   4 mg at 11/27/12 2045  . ondansetron (ZOFRAN) injection 4 mg  4 mg Intravenous Q6H PRN Runell Gess, MD      . ondansetron [facility]) tablet 4 mg  4 mg Oral Q6H PRN Eldred Manges, MD       Or  . ondansetron [facility]) injection 4 mg  4 mg Intravenous Q6H PRN Eldred Manges, MD      . oxyCODONE-acetaminophen (PERCOCET/ROXICET) 5-325 MG per tablet 1-2 tablet  1-2 tablet Oral Q4H PRN Abelino Derrick, PA   2 tablet at 12/09/12 0258  . oxyCODONE-acetaminophen (PERCOCET/ROXICET) 5-325 MG per tablet 1-2 tablet  1-2 tablet Oral Q4H PRN Eldred Manges, MD      . pantoprazole (PROTONIX) EC tablet 40 mg  40 mg Oral Daily Eda Paschal [place], [place]   40 mg at 12/08/12 1042  . polyethylene glycol (MIRALAX /  GLYCOLAX) packet 17 g  17 g Oral Daily PRN Eldred Manges, MD      . simvastatin (ZOCOR) tablet 40 mg  40 mg Oral QHS Eda Paschal [place], [place]   40 mg at 12/08/12 2201  . traMADol (ULTRAM) tablet 50 mg  50 mg Oral Q6H PRN Abelino Derrick, PA      . warfarin (COUMADIN) tablet 5 mg  5 mg Oral ONCE-1800 Benny Lennert, [facility]      . Warfarin - Pharmacist Dosing Inpatient   Does not apply q1800 Benny Lennert, PHARMD      . zolpidem (AMBIEN) tablet 5 mg  5 mg Oral QHS PRN Abelino Derrick, PA        PE: General appearance: alert, cooperative and no distress Lungs: clear to auscultation bilaterally Heart: irregularly irregular rhythm Extremities: s/p left BKA extremity is bandaged with ace wrap. Pulses: 2+ and symmetric Skin: warm and dry' Neurologic: Grossly normal  Lab Results:   Basename 12/09/12 0435 12/08/12 0458 12/07/12 0740  WBC 10.0 7.9 9.8  HGB 8.4* 8.4* 9.1*  HCT 27.0* 26.8* 28.8*  PLT 207 218 257   BMET  Basename 12/09/12 0435 12/08/12 0458 12/07/12 0740  NA 139 136 139  K 4.9 3.9 3.8  CL 109 107 106  CO2 20 21 21   GLUCOSE 160* 96 122*  BUN 31* 35* 38*  CREATININE 1.73* 1.92* 1.91*  CALCIUM 8.9 9.1 9.1   PT/INR  Basename 12/09/12 0435 12/08/12 0458 12/07/12 0740  LABPROT 16.3* 16.0* 17.1*  INR 1.34 1.31 1.43    Assessment/Plan    Principal Problem:  *Diabetic foot ulcer, S/P Lt BKA 12/08/12 Active Problems:  Hx of CABG x 6 2003  ICD in place, MDT May 2011  PAF (paroxysmal atrial fibrillation)  Diabetes mellitus type 2, insulin dependent  CKD (chronic kidney disease) stage 4, GFR 15-29 ml/min  Positive Coombs test  Anemia due to chronic illness  HTN (hypertension)  Ischemic cardiomyopathy, EF 25% 2D 12/12  PVD, Lt popliteal HSRA 7/13  Dyslipidemia  Chronic anticoagulation  Non-sustained ventricular tachycardia  Sleep apnea, C-Pap intol  Gout  Hypothyroidism  Chronic systolic CHF (congestive heart failure)  Plan: S/P left BKA yesterday. Pt is being  followed by ortho. H/H still down but stable 8.4/27.0. Will continue to monitor H/H. BP and HR stable. V-pacing on  telemetry. Pt had 1 episode of 6 beat NSVT yesterday at 19:49, but was asymptomatic. Cr has improved since yesterday. Cr today is 1.73 (1.92 yesterday). MD to follow with further recommendation.   LOS: 15 days    HAGER, BRYAN 12/09/2012 8:22 AM   ? Restart heparin for bridging with subtherapeutic INR.  HAGER, BRYAN PA-C 11:44 AM   Agree with note written by Jones Skene PAC  S/P LBKA. Hep to coumadin. Home when therapeutic.  Runell Gess 12/09/2012 5:46 PM

## 2012-12-09 NOTE — Progress Notes (Signed)
ANTICOAGULATION CONSULT NOTE - Initial Consult  Pharmacy Consult for Heparin Indication: atrial fibrillation, bridge to therapeutic INR  Allergies  Allergen Reactions  . Iodine Solution (Povidone Iodine) Rash    Topical only    Patient Measurements: Height: 6\' 2"  (188 cm) Weight: 212 lb 8.4 oz (96.4 kg) IBW/kg (Calculated) : 82.2  Heparin Dosing Weight: 96 kg  Vital Signs: Temp: 98.4 F (36.9 C) (01/07 1330) Temp src: Oral (01/07 1330) BP: 131/57 mmHg (01/07 1330) Pulse Rate: 60  (01/07 1330)  Labs:  Basename 12/09/12 0435 12/08/12 0458 12/07/12 1956 12/07/12 0740  HGB 8.4* 8.4* -- --  HCT 27.0* 26.8* -- 28.8*  PLT 207 218 -- 257  APTT -- -- -- --  LABPROT 16.3* 16.0* -- 17.1*  INR 1.34 1.31 -- 1.43  HEPARINUNFRC -- 0.36 0.70 1.24*  CREATININE 1.73* 1.92* -- 1.91*  CKTOTAL -- -- -- --  CKMB -- -- -- --  TROPONINI -- -- -- --    Estimated Creatinine Clearance: 42.9 ml/min (by C-G formula based on Cr of 1.73).   Medical History: Past Medical History  Diagnosis Date  . HTN (hypertension)   . PVD (peripheral vascular disease)   . COPD (chronic obstructive pulmonary disease)   . Diabetes mellitus     IDDM  . S/P CABG x 6 2003    Dr Cornelius Moras, Myoview low risk 2009  . Gout   . Paroxysmal atrial fibrillation   . Hypothyroidism   . ICD (implantable cardiac defibrillator) discharge 5/11    MDT  . Chronic renal insufficiency, stage III (moderate)   . Chronic systolic CHF (congestive heart failure)     EF 25-30% echo 12/12  . S/P angioplasty, 11/27/12 -Lt below the knee popliteal artery 11/28/2012    Assessment: 77 YOM on with afib and PVD, s/p left BKA yesterday coumadin is resumed but INR is subtherapeutic = 1.34 today. Will bridge with IV heparin. Pt. Was on IV heparin prior to surgery, and heparin level was therapeutic on 1100 units/hr. Hgb low but stable = 8.4, plt 207  Goal of Therapy:  Heparin level 0.3-0.7 units/ml Monitor platelets by anticoagulation  protocol: Yes   Plan:  - Start heparin infusion 1100 units/hr - f/u 8 hr heparin level at 0300 - daily heparin level and cbc with am labs  Bayard Hugger, PharmD, BCPS  Clinical Pharmacist  Pager: [phone]  12/09/2012,6:45 PM

## 2012-12-09 NOTE — Evaluation (Signed)
Occupational Therapy Evaluation Patient Details Name: [name] MRN: [id number] DOB: 1935/10/10 Today's Date: 12/09/2012 Time: 4098-1191 OT Time Calculation (min): 31 min  OT Assessment / Plan / Recommendation Clinical Impression  Pleasant 78 yr old male admitted with s/p left BKA.  Now presents with increased pain and decreased independence with basic selfcare tasks and functional ADLs.  Will benefit from acute OT services to help increase overall strength, balance, and overall independence with selfcare tasks.  Feel pt will benefit from SNF level rehab at discharge secondary to not having any supervision at home.    OT Assessment  Patient needs continued OT Services    Follow Up Recommendations  SNF    Barriers to Discharge Decreased caregiver support    Equipment Recommendations  None recommended by OT       Frequency  Min 2X/week    Precautions / Restrictions Precautions Precautions: Fall Restrictions Weight Bearing Restrictions: No   Pertinent Vitals/Pain Pain 10/10 in the left LE, O2 sats 98%    ADL  Eating/Feeding: Simulated;Modified independent Where Assessed - Eating/Feeding: Chair Grooming: Simulated;Set up Where Assessed - Grooming: Unsupported sitting Upper Body Bathing: Simulated;Set up Where Assessed - Upper Body Bathing: Unsupported sitting Lower Body Bathing: Simulated;+2 Total assistance Lower Body Bathing: Patient Percentage: 40% Where Assessed - Lower Body Bathing: Supported sit to stand Upper Body Dressing: Simulated;Set up Where Assessed - Upper Body Dressing: Unsupported sitting Lower Body Dressing: Simulated;+2 Total assistance Lower Body Dressing: Patient Percentage: 40% Where Assessed - Lower Body Dressing: Supported sit to stand Toilet Transfer: Simulated;+2 Total assistance Toilet Transfer: Patient Percentage: 40% Statistician Method: Surveyor, minerals: Other (comment) (to bedside chair, pt declined need to  toilet) Toileting - Clothing Manipulation and Hygiene: Simulated;+2 Total assistance Toileting - Clothing Manipulation and Hygiene: Patient Percentage: 40% Where Assessed - Toileting Clothing Manipulation and Hygiene: Sit to stand from 3-in-1 or toilet Tub/Shower Transfer Method: Not assessed Equipment Used: Rolling walker;Gait belt Transfers/Ambulation Related to ADLs: Pt able to perform stand piovt transfers and short distance ambulation with total assist +2 pt (40%) ADL Comments: Pt overall needs total assist +2 for sit to stand during LB selfcare.  Reports not wearing socks previous to this secondary to not being able to reach down and get them on.  Has a LH shoe horn that he used for donning his shoes.  Will benefit from more AE education.    OT Diagnosis: Generalized weakness;Acute pain  OT Problem List: Decreased strength;Decreased activity tolerance;Impaired balance (sitting and/or standing);Decreased knowledge of use of DME or AE;Pain;Cardiopulmonary status limiting activity OT Treatment Interventions: Self-care/ADL training;Therapeutic activities;DME and/or AE instruction;Patient/family education;Balance training   OT Goals Acute Rehab OT Goals OT Goal Formulation: With patient Time For Goal Achievement: 12/23/12 Potential to Achieve Goals: Good ADL Goals Pt Will Perform Grooming: with min assist;Supported;Standing at sink;Other (comment) (2 tasks) ADL Goal: Grooming - Progress: Goal set today Pt Will Perform Lower Body Bathing: with min assist;Sit to stand from bed;with adaptive equipment ADL Goal: Lower Body Bathing - Progress: Goal set today Pt Will Perform Lower Body Dressing: with min assist;with adaptive equipment;Sit to stand from bed ADL Goal: Lower Body Dressing - Progress: Goal set today Pt Will Transfer to Toilet: with min assist;with DME;3-in-1 ADL Goal: Toilet Transfer - Progress: Goal set today Pt Will Perform Toileting - Clothing Manipulation: with min  assist;Sitting on 3-in-1 or toilet;Standing ADL Goal: Toileting - Clothing Manipulation - Progress: Goal set today Pt Will Perform Toileting - Hygiene: with  min assist;Sit to stand from 3-in-1/toilet ADL Goal: Toileting - Hygiene - Progress: Goal set today  Visit Information  Last OT Received On: 12/09/12 Assistance Needed: +2    Subjective Data  Subjective: I haven't been out of bed except to pee. Patient Stated Goal: Pt did not state this session.   Prior Functioning     Home Living Lives With: Alone Available Help at Discharge: Family;Available PRN/intermittently (daughter and grand daughter work days) Type of Home: House Home Access: Stairs to enter Secretary/administrator of Steps: 9 Entrance Stairs-Rails: Can reach both Home Layout: One level Bathroom Shower/Tub: Forensic scientist: Handicapped height Home Adaptive Equipment: Therapist, sports - rolling Prior Function Level of Independence: Independent with assistive device(s) Able to Take Stairs?: Yes Driving: Yes Vocation: Retired Musician: No difficulties Dominant Hand: Right         Vision/Perception Vision - Assessment Eye Alignment: Within Functional Limits Vision Assessment: Vision not tested Perception Perception: Within Functional Limits Praxis Praxis: Intact   Cognition  Overall Cognitive Status: Appears within functional limits for tasks assessed/performed Arousal/Alertness: Awake/alert Orientation Level: Appears intact for tasks assessed Behavior During Session: [facility] for tasks performed    Extremity/Trunk Assessment Right Upper Extremity Assessment RUE ROM/Strength/Tone: Within functional levels RUE Sensation: WFL - Light Touch RUE Coordination: Deficits RUE Coordination Deficits: Pt with only 25% of full AROM digit flexion secondary to arthritis.  Pt uses lateral and tip to tip pinch for FM tasks. Left Upper Extremity Assessment LUE  ROM/Strength/Tone: Within functional levels LUE Sensation: WFL - Light Touch LUE Coordination: Deficits LUE Coordination Deficits: Pt with only 25% of full AROM digit flexion secondary to arthritis.  Pt uses lateral and tip to tip pinch for FM tasks. Right Lower Extremity Assessment RLE ROM/Strength/Tone: Deficits RLE ROM/Strength/Tone Deficits: functional weakness observed, assist required to stand however at least 3+/5 throughout Left Lower Extremity Assessment LLE ROM/Strength/Tone: Deficits LLE ROM/Strength/Tone Deficits: did not apply resistance however good hip AROM;  pt lacking full knee extension Trunk Assessment Trunk Assessment: Normal     Mobility Bed Mobility Bed Mobility: Supine to Sit Supine to Sit: 5: Supervision;HOB elevated;With rails Details for Bed Mobility Assistance: verbal cues for technique, increased effort and time Transfers Transfers: Sit to Stand Sit to Stand: 1: +2 Total assist;From bed Sit to Stand: Patient Percentage: 40% Stand to Sit: 3: Mod assist;To chair/3-in-1;With upper extremity assist Details for Transfer Assistance: verbal cues for safe technique, assist to rise and steady as well as control descent           Balance Balance Balance Assessed: Yes Static Standing Balance Static Standing - Balance Support: Right upper extremity supported;Left upper extremity supported Static Standing - Level of Assistance: 1: +2 Total assist;Other (comment) (Pt 60% for standing statically)   End of Session OT - End of Session Equipment Utilized During Treatment: Gait belt Activity Tolerance: Patient limited by fatigue Patient left: in chair;with call bell/phone within reach Nurse Communication: Mobility status     [name] OTR/L Pager number [phone] 12/09/2012, 10:11 AM

## 2012-12-09 NOTE — Progress Notes (Signed)
Rehab Admissions Coordinator Note:  Patient was screened by Clois Dupes for appropriateness for an Inpatient Acute Rehab Consult.  Noted PT and OT recommendations of CIR vs SNF. At this time, we are recommending Inpatient Rehab consult.  Clois Dupes, RN 12/09/2012, 4:10 PM  I can be reached at [phone].

## 2012-12-09 NOTE — Evaluation (Signed)
Physical Therapy Evaluation Patient Details Name: [name] MRN: [id number] DOB: November 15, 1935 Today's Date: 12/09/2012 Time: 4098-1191 PT Time Calculation (min): 21 min  PT Assessment / Plan / Recommendation Clinical Impression  Pt admitted with nonhealing L diabetic foot ulcers and now s/p L BKA.  Pt now requiring increased assist for mobility and safety.  Pt would benefit from acute PT services in order to improve independence with transfers and ambulation as well as strengthen and increase ROM for L LE to prepare for d/c to next venue.    PT Assessment  Patient needs continued PT services    Follow Up Recommendations  CIR    Does the patient have the potential to tolerate intense rehabilitation      Barriers to Discharge        Equipment Recommendations  Wheelchair (measurements PT);Wheelchair cushion (measurements PT)    Recommendations for Other Services     Frequency Min 4X/week    Precautions / Restrictions Precautions Precautions: Fall Restrictions Weight Bearing Restrictions: No   Pertinent Vitals/Pain 10/10 L LE pain, premedicated 1 hr prior to session, repositioned in recliner with L LE elevated      Mobility  Bed Mobility Bed Mobility: Supine to Sit Supine to Sit: 5: Supervision;HOB elevated;With rails Details for Bed Mobility Assistance: verbal cues for technique, increased effort and time Transfers Transfers: Stand to Sit;Sit to Stand Sit to Stand: 1: +2 Total assist;From bed Sit to Stand: Patient Percentage: 40% Stand to Sit: 3: Mod assist;To chair/3-in-1;With upper extremity assist Details for Transfer Assistance: verbal cues for safe technique, assist to rise and steady as well as control descent Ambulation/Gait Ambulation/Gait Assistance: 1: +2 Total assist Ambulation/Gait: Patient Percentage: 40% Ambulation Distance (Feet): 15 Feet Assistive device: Rolling walker Ambulation/Gait Assistance Details: increased assist for R knee buckling at  times, verbal cues for sequence, pt unable to lift R foot from floor so more of a glide than hop, verbal cues to use upper body through RW to assist Gait Pattern: Step-to pattern Gait velocity: decreased General Gait Details: increased effort    Shoulder Instructions     Exercises     PT Diagnosis: Difficulty walking;Acute pain  PT Problem List: Decreased strength;Decreased activity tolerance;Decreased balance;Decreased mobility;Decreased safety awareness;Decreased knowledge of use of DME;Pain PT Treatment Interventions: DME instruction;Gait training;Functional mobility training;Therapeutic activities;Therapeutic exercise;Patient/family education;Neuromuscular re-education;Balance training   PT Goals Acute Rehab PT Goals PT Goal Formulation: With patient Time For Goal Achievement: 12/23/12 Potential to Achieve Goals: Good Pt will go Sit to Stand: with supervision PT Goal: Sit to Stand - Progress: Goal set today Pt will go Stand to Sit: with supervision PT Goal: Stand to Sit - Progress: Goal set today Pt will Transfer Bed to Chair/Chair to Bed: with supervision PT Transfer Goal: Bed to Chair/Chair to Bed - Progress: Goal set today Pt will Ambulate: 16 - 50 feet;with supervision;with least restrictive assistive device PT Goal: Ambulate - Progress: Goal set today PT Goal: Up/Down Stairs - Progress: Discontinued (comment) Pt will Propel Wheelchair: 51 - 150 feet;with modified independence PT Goal: Propel Wheelchair - Progress: Goal set today  Visit Information  Last PT Received On: 12/09/12 Assistance Needed: +2    Subjective Data  Subjective: I've been layed up in bed for days except to urinate so I know I'm weak.   Prior Functioning  Home Living Lives With: Alone Available Help at Discharge: Family;Available PRN/intermittently (daughter and grand daughter work days) Type of Home: House Home Access: Stairs to enter [employer] of  Steps: 9 Entrance Stairs-Rails: Can  reach both Home Layout: One level Bathroom Shower/Tub: Tub/shower unit;Curtain Bathroom Toilet: Handicapped height Home Adaptive Equipment: Therapist, sports - rolling Prior Function Level of Independence: Independent with assistive device(s) Able to Take Stairs?: Yes Driving: Yes Vocation: Retired Musician: No difficulties Dominant Hand: Right    Cognition  Overall Cognitive Status: Appears within functional limits for tasks assessed/performed Arousal/Alertness: Awake/alert Orientation Level: Appears intact for tasks assessed Behavior During Session: [facility] for tasks performed    Extremity/Trunk Assessment Right Upper Extremity Assessment RUE ROM/Strength/Tone: Within functional levels RUE Sensation: WFL - Light Touch RUE Coordination: Deficits RUE Coordination Deficits: Pt with only 25% of full AROM digit flexion secondary to arthritis.  Pt uses lateral and tip to tip pinch for FM tasks. Left Upper Extremity Assessment LUE ROM/Strength/Tone: Within functional levels LUE Sensation: WFL - Light Touch LUE Coordination: Deficits LUE Coordination Deficits: Pt with only 25% of full AROM digit flexion secondary to arthritis.  Pt uses lateral and tip to tip pinch for FM tasks. Right Lower Extremity Assessment RLE ROM/Strength/Tone: Deficits RLE ROM/Strength/Tone Deficits: functional weakness observed, assist required to stand however at least 3+/5 throughout Left Lower Extremity Assessment LLE ROM/Strength/Tone: Deficits LLE ROM/Strength/Tone Deficits: did not apply resistance however good hip AROM;  pt lacking full knee extension Trunk Assessment Trunk Assessment: Normal   Balance Balance Balance Assessed: Yes Static Standing Balance Static Standing - Balance Support: Bilateral upper extremity supported Static Standing - Level of Assistance: 1: +2 Total assist;Other (comment) (60% with verbal cues for posture, UE assist through RW)  End of Session PT - End of  Session Equipment Utilized During Treatment: Gait belt Activity Tolerance: Patient limited by pain;Patient limited by fatigue Patient left: in chair;with call bell/phone within reach Nurse Communication: Mobility status  GP     Corby Villasenor,KATHrine E 12/09/2012, 10:16 AM Pager: [phone]

## 2012-12-09 NOTE — Op Note (Signed)
[name], [name]               ACCOUNT NO.:  [id number]  MEDICAL RECORD NO.:  [id number]  LOCATION:  2037                         FACILITY:  [facility]  PHYSICIAN:  Qaadir Kent C. Ophelia Charter, M.D.    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  PROCEDURE:  Left below-knee amputation.  SURGEON:  Christian Borgerding C. Ophelia Charter, M.D.  ANESTHESIA:  General.  ESTIMATED BLOOD LOSS:  Minimal.  TOURNIQUET TIME:  Less than an hour.  ASSISTANT:  None.  This 78 year old male has had a nonhealing ulcers over his left lower extremity anteriorly over the ulnar aspect of the ankle and foot, full thickness, with arterial insufficiency and has had PTA, peroneal artery and has occlusion of other orders with nonhealing ulcer.  He was seen by infectious disease.  Medical Service recommended amputation.  OPERATIVE PROCEDURE:  After induction of general anesthesia with LMA. The magnet was placed on his pacemaker defibrillator.  Proximal thigh tourniquet was applied and bipolar cautery was planned due to his pacemaker defibrillator.  Standard prepping with ChloraPrep was performed after the foot had been first covered with a Betadine Steri- Drape just to the ankle.  Impervious stockinette, Coban was applied. Split sheets, drapes, extremity sheet.  Sterile skin marker was used. Time-out procedure was completed, 2 g Ancef was given prophylactically. Leg was wrapped in Esmarch, tourniquet inflated.  The larger posterior flap and the anterior flap was used with slight fishmouth due to areas of venous insufficiency anteriorly over his leg.  The subcutaneous tissue showed significant amount of subcutaneous edema.  Bipolar cautery was used.   The patient already had previous saphenous vein harvest. Periosteum was cleaned off the tibia and a Kelly clamps placed on the tibia.  The tibia was divided between the junction of proximal 3rd and the middle of the tibia and anterior aspect was beveled and rounded with a hammer.  Pivot was divided to 3 cm proximal with a bone debrider with no sharp edges.  Arteries were double tied with silk suture ligatures. All nerves were cut on stretch including the sural nerve and lesser saphenous vein was coagulated.  The fracture site or the amputation site was brought into flexion amputation and it was used to fall behind the fibula and tibia cutting into the posterior flap.  Posterior flaps thin some.  The tourniquet deflated.  Hemostasis was obtained.  Additional sutures of bipolar cautery.  Suture ligatures were applied as needed. The posterior tibial artery was completely occluded.  Anterior tibial artery was also occluded.  There was a trace blood flow in the peroneal artery.  Once hemostasis was obtained.  Copious irrigation was used followed by closure with #1 Vicryl and the deep fascia, suturing it to the anterior periosteum to cover the tibia.  2-0 Vicryl subcutaneous tissue, 2-0 nylon interrupted horizontal mattress sutures in combination with a few simple sutures.  Xeroform 4x4s, ABDs, Kerlix and Ace wrap was applied postoperatively 6 inch.  The  patient tolerated the procedure well and was transferred to the recovery room in stable condition. Instrument count and needle count was correct.     Seyed Heffley C. Ophelia Charter, M.D.     MCY/MEDQ  D:  12/08/2012  T:  12/09/2012  Job:  [id number]

## 2012-12-10 LAB — GLUCOSE, CAPILLARY: Glucose-Capillary: 157 mg/dL — ABNORMAL HIGH (ref 70–99)

## 2012-12-10 LAB — CBC
HCT: 27 % — ABNORMAL LOW (ref 39.0–52.0)
Hemoglobin: 8.6 g/dL — ABNORMAL LOW (ref 13.0–17.0)
MCHC: 31.9 g/dL (ref 30.0–36.0)
RBC: 3.07 MIL/uL — ABNORMAL LOW (ref 4.22–5.81)
WBC: 11.3 10*3/uL — ABNORMAL HIGH (ref 4.0–10.5)

## 2012-12-10 LAB — BASIC METABOLIC PANEL
Chloride: 105 mEq/L (ref 96–112)
Creatinine, Ser: 1.73 mg/dL — ABNORMAL HIGH (ref 0.50–1.35)
GFR calc Af Amer: 43 mL/min — ABNORMAL LOW (ref >90)
Sodium: 137 mEq/L (ref 135–145)

## 2012-12-10 LAB — HEPARIN LEVEL (UNFRACTIONATED): Heparin Unfractionated: 0.17 IU/mL — ABNORMAL LOW (ref 0.30–0.70)

## 2012-12-10 LAB — PROTIME-INR
INR: 1.43 (ref 0.00–1.49)
Prothrombin Time: 17.1 seconds — ABNORMAL HIGH (ref 11.6–15.2)

## 2012-12-10 MED ORDER — GLUCERNA SHAKE PO LIQD
237.0000 mL | Freq: Two times a day (BID) | ORAL | Status: DC
  Administered 2012-12-10 – 2012-12-11 (×2): 237 mL via ORAL

## 2012-12-10 MED ORDER — CEFAZOLIN SODIUM 1-5 GM-% IV SOLN: 1.0000 g | Freq: Three times a day (TID) | INTRAVENOUS | Status: DC

## 2012-12-10 MED ORDER — ENOXAPARIN SODIUM 100 MG/ML ~~LOC~~ SOLN
95.0000 mg | Freq: Two times a day (BID) | SUBCUTANEOUS | Status: DC
  Administered 2012-12-10 – 2012-12-11 (×2): 95 mg via SUBCUTANEOUS
  Filled 2012-12-10 (×4): qty 1

## 2012-12-10 MED ORDER — FUROSEMIDE 80 MG PO TABS
80.0000 mg | ORAL_TABLET | Freq: Every day | ORAL | Status: DC
  Administered 2012-12-10 – 2012-12-11 (×2): 80 mg via ORAL
  Filled 2012-12-10 (×2): qty 1

## 2012-12-10 MED ORDER — WARFARIN SODIUM 7.5 MG PO TABS
7.5000 mg | ORAL_TABLET | Freq: Once | ORAL | Status: DC
  Administered 2012-12-10: 7.5 mg via ORAL
  Filled 2012-12-10: qty 1

## 2012-12-10 MED ORDER — POLYETHYLENE GLYCOL 3350 17 G PO PACK
17.0000 g | PACK | Freq: Every day | ORAL | Status: DC
  Administered 2012-12-11: 17 g via ORAL
  Filled 2012-12-10 (×2): qty 1

## 2012-12-10 MED ORDER — WARFARIN SODIUM 7.5 MG PO TABS
7.5000 mg | ORAL_TABLET | Freq: Once | ORAL | Status: DC
  Filled 2012-12-10: qty 1

## 2012-12-10 MED ORDER — PRO-STAT SUGAR FREE PO LIQD
30.0000 mL | Freq: Two times a day (BID) | ORAL | Status: DC
  Administered 2012-12-10 – 2012-12-11 (×2): 30 mL via ORAL
  Filled 2012-12-10 (×3): qty 30

## 2012-12-10 MED ORDER — DOCUSATE SODIUM 100 MG PO CAPS
200.0000 mg | ORAL_CAPSULE | Freq: Every day | ORAL | Status: DC
  Administered 2012-12-10 – 2012-12-11 (×2): 200 mg via ORAL
  Filled 2012-12-10: qty 2

## 2012-12-10 NOTE — Progress Notes (Signed)
Patient not yet accepted to CIR. I have asked for an inpt rehab consult so that we can assess if inpt rehab or SNF rehab most appropriate rehab venue. I have discussed with RN CM as well as Charity fundraiser. Please order an inpt rehab consult. [phone]

## 2012-12-10 NOTE — Progress Notes (Signed)
ANTICOAGULATION CONSULT NOTE - Follow Up Consult  Pharmacy Consult for heparin Indication: atrial fibrillation  Labs:  Basename 12/10/12 0314 12/09/12 0435 12/08/12 0458 12/07/12 1956  HGB 8.6* 8.4* -- --  HCT 27.0* 27.0* 26.8* --  PLT 217 207 218 --  APTT -- -- -- --  LABPROT 17.1* 16.3* 16.0* --  INR 1.43 1.34 1.31 --  HEPARINUNFRC 0.17* -- 0.36 0.70  CREATININE 1.73* 1.73* 1.92* --  CKTOTAL -- -- -- --  CKMB -- -- -- --  TROPONINI -- -- -- --    Assessment: 77yo male subtherapeutic on heparin after resumed at previously therapeutic rate at low end of goal.  Goal of Therapy:  Heparin level 0.3-0.7 units/ml   Plan:  Will increase heparin gtt by 1 unit/kg/hr to 1200 units/hr and check level in 8hr.  Colleen Can PharmD BCPS 12/10/2012,5:27 AM

## 2012-12-10 NOTE — Progress Notes (Signed)
Subjective: 2 Days Post-Op Procedure(s) (LRB): AMPUTATION BELOW KNEE (Left) Patient reports pain as moderate.    Objective: Vital signs in last 24 hours: Temp:  [98.4 F (36.9 C)-99.1 F (37.3 C)] 99.1 F (37.3 C) (01/08 0622) Pulse Rate:  [60-69] 63  (01/08 0622) Resp:  [17-18] 18  (01/08 0622) BP: (131-144)/(54-64) 144/54 mmHg (01/08 0622) SpO2:  [96 %-98 %] 96 % (01/08 0622)  Intake/Output from previous day: 01/07 0701 - 01/08 0700 In: 3009.8 [P.O.:480; I.V.:2529.8] Out: 950 [Urine:950] Intake/Output this shift:     Basename 12/10/12 0314 12/09/12 0435 12/08/12 0458  HGB 8.6* 8.4* 8.4*    Basename 12/10/12 0314 12/09/12 0435  WBC 11.3* 10.0  RBC 3.07* 3.06*  HCT 27.0* 27.0*  PLT 217 207    Basename 12/10/12 0314 12/09/12 0435  NA 137 139  K 4.7 4.9  CL 105 109  CO2 21 20  BUN 28* 31*  CREATININE 1.73* 1.73*  GLUCOSE 177* 160*  CALCIUM 9.2 8.9    Basename 12/10/12 0314 12/09/12 0435  LABPT -- --  INR 1.43 1.34    dressing dry  Assessment/Plan: 2 Days Post-Op Procedure(s) (LRB): AMPUTATION BELOW KNEE (Left) Up with therapy  .  He can follow up with me in 2 wks  Arvind Mexicano C 12/10/2012, 8:20 AM

## 2012-12-10 NOTE — Clinical Social Work Note (Signed)
CSW consulted re: SNF placement. PT/OT recommended CIR and SNF. CIR rehab admissions coordinator met with patient (12/09/12) and stated they recommended CIR. CSW signing off, no other psychosocial concerns identified. Please re-consult as needed.  Lia Foyer, LCSWA [facility] Clinical Social Worker Contact #: [phone] (PRN)

## 2012-12-10 NOTE — Progress Notes (Signed)
Subjective: Resting comfortably in NAD. Intermittent shooting leg pain.  Objective: Vital signs in last 24 hours: Temp:  [98.4 F (36.9 C)-99.1 F (37.3 C)] 99 F (37.2 C) (01/08 0900) Pulse Rate:  [60-69] 68  (01/08 0900) Resp:  [17-18] 18  (01/08 0622) BP: (131-144)/(52-64) 138/52 mmHg (01/08 0900) SpO2:  [96 %] 96 % (01/08 0900) Weight change:  Last BM Date: 12/07/12 Intake/Output from previous day: +2059 01/07 0701 - 01/08 0700 In: 3009.8 [P.O.:480; I.V.:2529.8] Out: 950 [Urine:950] Intake/Output this shift: Total I/O In: -  Out: 100 [Urine:100]  PE:  General:alert and oriented, depressed mood & flat affect.  Heart:irreg irreg with pacing  Lungs:few crackles but no wheezes, non-labored  Abd:+ BS, soft non tender  Ext:+ edema, R foot in Uniboot; L BKA stump dressing intact.   Lab Results:  [facility] 12/10/12 0314 12/09/12 0435  WBC 11.3* 10.0  HGB 8.6* 8.4*  HCT 27.0* 27.0*  PLT 217 207   BMET  Basename 12/10/12 0314 12/09/12 0435  NA 137 139  K 4.7 4.9  CL 105 109  CO2 21 20  GLUCOSE 177* 160*  BUN 28* 31*  CREATININE 1.73* 1.73*  CALCIUM 9.2 8.9   No results found for this basename: TROPONINI:2,CK,MB:2 in the last 72 hours  Lab Results  Component Value Date   CHOL  Value: 115        ATP III CLASSIFICATION:  <200     mg/dL   Desirable  147-829  mg/dL   Borderline High  >=562    mg/dL   High 13/0/8657   HDL 25* 11/07/2007   LDLCALC  Value: 50        Total Cholesterol/HDL:CHD Risk Coronary Heart Disease Risk Table                     Men   Women  1/2 Average Risk   3.4   3.3 11/07/2007   TRIG 198* 11/07/2007   CHOLHDL 4.6 11/07/2007   Lab Results  Component Value Date   HGBA1C 8.4* 11/24/2012     Lab Results  Component Value Date   TSH 1.293 11/24/2012      Studies/Results: No results found.  Medications: I have reviewed the patient's current medications.    Marland Kitchen allopurinol  100 mg Oral Daily  . carvedilol  25 mg Oral BID WC  . feeding  supplement  237 mL Oral BID BM  . feeding supplement  30 mL Oral BID WC  . hydrALAZINE  25 mg Oral TID  . insulin aspart  0-15 Units Subcutaneous TID WC  . insulin aspart  0-5 Units Subcutaneous QHS  . insulin glargine  12 Units Subcutaneous BID  . isosorbide dinitrate  20 mg Oral TID  . levothyroxine  50 mcg Oral QAC breakfast  . pantoprazole  40 mg Oral Daily  . simvastatin  40 mg Oral QHS  . 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 Active Problems:  CKD (chronic kidney disease) stage 4, GFR 15-29 ml/min  PVD, Lt popliteal HSRA 7/13  Non-sustained ventricular tachycardia  ICD in place, MDT May 2011  Diabetes mellitus type 2, insulin dependent  Ischemic cardiomyopathy, EF 25% 2D 12/12  Chronic anticoagulation  Chronic systolic CHF (congestive heart failure)  Hx of CABG x 6 2003  PAF (paroxysmal atrial fibrillation)  Sleep apnea, C-Pap intol  Gout  Hypothyroidism  Positive Coombs test  Anemia due to chronic illness  HTN (hypertension)  Dyslipidemia   PLAN:2 Days Post-Op Procedure(s) (LRB):  AMPUTATION BELOW KNEE (Left)   Pt accepted for CIR.  Could change Heparin to coumadin and transfer today or am.  New ulcer on or rt ankle.   LOS: 16 days   INGOLD,LAURA R 12/10/2012, 1:11 PM  I have seen and evaluated the patient this afternoon along with Corine Shelter, [place]; Nada Boozer, NP, or Wilburt Finlay, [place]. I agree with his/her findings, examination as well as impression recommendations.  He actually looks fine -- preservates about almost being dropped by rehab therapist.  Seems stable from a cardiac standpoint, no HF-- mild edema, so restart home Lasix  Can convert to enoxaparin for bridging to therapeutic INR.  DM control is relatively good - Increased Lantus coverage & better control.  BP & HR stable on BB, Hydralazine/Nitrate  Renal function stable to improved  Anemia  stable On statin  On synthroid for hypothyroidism  PPI for GI prophylaxis   Is stable to transfer to Acute Rehab if possible today or tomorrow.  Marykay Lex, M.D., M.S. [facility] [address] [place], [place]  [address]  [phone] Pager # [phone] 12/10/2012 1:56 PM

## 2012-12-10 NOTE — Progress Notes (Signed)
ANTICOAGULATION CONSULT NOTE - Follow Up Consult  Pharmacy Consult for Coumadin; Heparin --> Lovenox Indication: Afib, PVD  Allergies  Allergen Reactions  . Iodine Solution (Povidone Iodine) Rash    Topical only    Patient Measurements: Height: 6\' 2"  (188 cm) Weight: 212 lb 8.4 oz (96.4 kg) IBW/kg (Calculated) : 82.2   Vital Signs: Temp: 98 F (36.7 C) (01/08 1330) Temp src: Oral (01/08 1330) BP: 120/56 mmHg (01/08 1330) Pulse Rate: 52  (01/08 1330)  Labs:  Basename 12/10/12 0314 12/09/12 0435 12/08/12 0458 12/07/12 1956  HGB 8.6* 8.4* -- --  HCT 27.0* 27.0* 26.8* --  PLT 217 207 218 --  APTT -- -- -- --  LABPROT 17.1* 16.3* 16.0* --  INR 1.43 1.34 1.31 --  HEPARINUNFRC 0.17* -- 0.36 0.70  CREATININE 1.73* 1.73* 1.92* --  CKTOTAL -- -- -- --  CKMB -- -- -- --  TROPONINI -- -- -- --    Estimated Creatinine Clearance: 42.9 ml/min (by C-G formula based on Cr of 1.73).   Medications:  Heparin 1200 units/hr  Assessment: 77yom on heparin bridging to Coumadin for Afib and PVD. INR (1.43) is subtherapeutic as expected after Coumadin 5mg  x 1 s/p BKA. PTA regimen is 5mg  daily except 2.5mg  Tue/Fri. Will order Coumadin 7.5mg  tonight to get INR moving and follow-up AM INR.     Pharmacy has been consulted to transition heparin to Lovenox. Due to q12hr scheduling of Lovenox, will delay turning off heparin drip until ~1700 and time first dose of Lovenox @ 1800.  - H/H low but stable, Plts wnl - No significant bleeding reported - Weight 96kg - CrCl 43 ml/min  Goal of Therapy:  INR 2-3 Anti-Xa level 0.6-1.2 units/ml 4hrs after LMWH dose given Monitor platelets by anticoagulation protocol: Yes   Plan:  1. Coumadin 7.5mg  po x 1 today 2. Discontinue heparin drip @ 1700 today (discussed with RN) 3. Lovenox 95mg  (~1mg /kg) SQ q12h - first dose @ 1800 4. Daily INR, CBC q72hr  Cleon Dew [phone] 12/10/2012,2:43 PM

## 2012-12-10 NOTE — Progress Notes (Signed)
Clinical Social Work Department CLINICAL SOCIAL WORK PLACEMENT NOTE 12/10/2012  Patient:  [name], [name]  Account Number:  [id number] Admit date:  11/24/2012  Clinical Social Worker:  Unk Lightning, LCSW  Date/time:  12/10/2012 02:45 PM  Clinical Social Work is seeking post-discharge placement for this patient at the following level of care:   SKILLED NURSING   (*CSW will update this form in Epic as items are completed)   12/10/2012  Patient/family provided with [facility] Department of Clinical Social Work's list of facilities offering this level of care within the geographic area requested by the patient (or if unable, by the patient's family).  12/10/2012  Patient/family informed of their freedom to choose among providers that offer the needed level of care, that participate in Medicare, Medicaid or managed care program needed by the patient, have an available bed and are willing to accept the patient.  12/10/2012  Patient/family informed of [facility]' ownership interest in [facility], as well as of the fact that they are under no obligation to receive care at this facility.  PASARR submitted to [facility] on N/A for [facility] placement PASARR number received from [employer] on   FL2 transmitted to all facilities in geographic area requested by pt/family on  12/10/2012 FL2 transmitted to all facilities within larger geographic area on   Patient informed that his/her managed care company has contracts with or will negotiate with  certain facilities, including the following:     Patient/family informed of bed offers received:   Patient chooses bed at  Physician recommends and patient chooses bed at    Patient to be transferred to  on   Patient to be transferred to facility by   The following physician request were entered in Epic:   Additional Comments:

## 2012-12-10 NOTE — Consult Note (Signed)
Physical Medicine and Rehabilitation Consult Reason for Consult: Left BKA Referring Physician: Dr. Allyson Sabal   HPI: [name] is a 78 y.o. right-handed male with history of diabetes mellitus with peripheral neuropathy, coronary artery disease with CABG, PAF with ICD on chronic Coumadin therapy and chronic systolic congestive heart failure. Patient was in the 11/24/2012 with progressive nonhealing ulcers of left lower extremity. Angiography revealed moderate stenosis left SFA and underwent angioplasty. Patient's chronic Coumadin was resumed. 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 Ophelia Charter. Chronic Coumadin has been resumed and plan Lovenox for INR therapeutic. Hospital course anemia 8.6 and monitored. Patient also with stage II pressure ulcer right heel with followup per wound care nurse and Prevalon boot in place. Physical and occupational therapy evaluations completed an ongoing. Physical therapy is recommended physical medicine rehabilitation consult to consider inpatient rehabilitation services   Review of Systems  Cardiovascular: Positive for palpitations and leg swelling.  Musculoskeletal: Positive for joint pain.  Neurological: Positive for weakness.  All other systems reviewed and are negative.   Past Medical History  Diagnosis Date  . HTN (hypertension)   . PVD (peripheral vascular disease)   . COPD (chronic obstructive pulmonary disease)   . Diabetes mellitus     IDDM  . S/P CABG x 6 2003    Dr Cornelius Moras, Myoview low risk 2009  . Gout   . Paroxysmal atrial fibrillation   . Hypothyroidism   . ICD (implantable cardiac defibrillator) discharge 5/11    MDT  . Chronic renal insufficiency, stage III (moderate)   . Chronic systolic CHF (congestive heart failure)     EF 25-30% echo 12/12  . S/P angioplasty, 11/27/12 -Lt below the knee popliteal artery 11/28/2012   Past Surgical History  Procedure Date  .  Coronary artery bypass graft   . Cardiac catheterization   . Amputation 12/08/2012    Procedure: AMPUTATION BELOW KNEE;  Surgeon: Eldred Manges, MD;  Location: [facility] OR;  Service: Orthopedics;  Laterality: Left;   History reviewed. No pertinent family history. Social History:  reports that he quit smoking about 9 years ago. He has quit using smokeless tobacco. He reports that he does not drink alcohol or use illicit drugs. Allergies:  Allergies  Allergen Reactions  . Iodine Solution (Povidone Iodine) Rash    Topical only   Medications Prior to Admission  Medication Sig Dispense Refill  . allopurinol (ZYLOPRIM) 100 MG tablet Take 1 tablet (100 mg total) by mouth daily.  30 tablet  5  . carvedilol (COREG) 25 MG tablet Take 1 tablet (25 mg total) by mouth 2 (two) times daily with a meal.  60 tablet  5  . furosemide (LASIX) 80 MG tablet Take 80 mg by mouth daily.       . hydrALAZINE (APRESOLINE) 25 MG tablet Take 25 mg by mouth 3 (three) times daily.      . insulin aspart (NOVOLOG) 100 UNIT/ML injection Inject 10-15 Units into the skin 3 (three) times daily before meals. Per sliding scale      . insulin glargine (LANTUS) 100 UNIT/ML injection Inject 10 Units into the skin 2 (two) times daily.      . isosorbide dinitrate (ISORDIL) 20 MG tablet Take 20 mg by mouth 3 (three) times daily.      Marland Kitchen levothyroxine (SYNTHROID, LEVOTHROID) 50 MCG tablet Take 50 mcg by mouth every morning.       Marland Kitchen  metolazone (ZAROXOLYN) 2.5 MG tablet Take 2.5 mg by mouth daily. States he takes with his furosemide every day except on Tuesday and Friday      . omeprazole (PRILOSEC) 20 MG capsule Take 40 mg by mouth every morning.       . potassium chloride SA (K-DUR,KLOR-CON) 20 MEQ tablet Take 20 mEq by mouth 2 (two) times daily.      . simvastatin (ZOCOR) 40 MG tablet Take 40 mg by mouth at bedtime.       Marland Kitchen warfarin (COUMADIN) 5 MG tablet Take 2.5-5 mg by mouth every evening. Takes one tablet (5mg ) every day except on Tuesdays  and Fridays, take one-half tablet (2.5mg )        Home: Home Living Lives With: Alone Available Help at Discharge: Family;Available PRN/intermittently (daughter and grand daughter work days) Type of Home: House Home Access: Stairs to enter Secretary/administrator of Steps: 9 Entrance Stairs-Rails: Can reach both Home Layout: One level Bathroom Shower/Tub: Tub/shower unit;Curtain Firefighter: Handicapped height Home Adaptive Equipment: Therapist, sports - rolling  Functional History: Prior Function Able to Take Stairs?: Yes Driving: Yes Vocation: Retired Functional Status:  Mobility: Bed Mobility Bed Mobility: Supine to Sit Supine to Sit: 5: Supervision;HOB elevated;With rails Sitting - Scoot to Edge of Bed: 7: Independent Sit to Supine: 7: Independent Transfers Transfers: Stand to Sit;Sit to Stand Sit to Stand: 1: +2 Total assist;From bed Sit to Stand: Patient Percentage: 40% Stand to Sit: 3: Mod assist;To chair/3-in-1;With upper extremity assist Ambulation/Gait Ambulation/Gait Assistance: 1: +2 Total assist Ambulation/Gait: Patient Percentage: 40% Ambulation Distance (Feet): 15 Feet Assistive device: Rolling walker Ambulation/Gait Assistance Details: increased assist for R knee buckling at times, verbal cues for sequence, pt unable to lift R foot from floor so more of a glide than hop, verbal cues to use upper body through RW to assist Gait Pattern: Step-to pattern Gait velocity: decreased General Gait Details: increased effort Stairs: No Wheelchair Mobility Wheelchair Mobility: No  ADL: ADL Eating/Feeding: Simulated;Modified independent Where Assessed - Eating/Feeding: Chair Grooming: Simulated;Set up Where Assessed - Grooming: Unsupported sitting Upper Body Bathing: Simulated;Set up Where Assessed - Upper Body Bathing: Unsupported sitting Lower Body Bathing: Simulated;+2 Total assistance Where Assessed - Lower Body Bathing: Supported sit to stand Upper Body  Dressing: Simulated;Set up Where Assessed - Upper Body Dressing: Unsupported sitting Lower Body Dressing: Simulated;+2 Total assistance Where Assessed - Lower Body Dressing: Supported sit to stand Toilet Transfer: Simulated;+2 Total assistance Toilet Transfer Method: Surveyor, minerals: Other (comment) (to bedside chair, pt declined need to toilet) Tub/Shower Transfer Method: Not assessed Equipment Used: Rolling walker;Gait belt Transfers/Ambulation Related to ADLs: Pt able to perform stand piovt transfers and short distance ambulation with total assist +2 pt (40%) ADL Comments: Pt overall needs total assist +2 for sit to stand during LB selfcare.  Reports not wearing socks previous to this secondary to not being able to reach down and get them on.  Has a LH shoe horn that he used for donning his shoes.  Will benefit from more AE education.  Cognition: Cognition Arousal/Alertness: Awake/alert Orientation Level: Oriented X4 Cognition Overall Cognitive Status: Appears within functional limits for tasks assessed/performed Arousal/Alertness: Awake/alert Orientation Level: Appears intact for tasks assessed Behavior During Session: [facility] for tasks performed  Blood pressure 120/56, pulse 52, temperature 98 F (36.7 C), temperature source Oral, resp. rate 17, height 6\' 2"  (1.88 m), weight 96.4 kg (212 lb 8.4 oz), SpO2 95.00%. Physical Exam  Vitals reviewed. Constitutional: He is  oriented to person, place, and time.  HENT:  Head: Normocephalic and atraumatic.       Speech very coarse, muffled. Difficult to understand.  Eyes: Conjunctivae normal are normal. Pupils are equal, round, and reactive to light.       Pupils round and reactive to light  Neck: Neck supple. No thyromegaly present.  Cardiovascular: Normal rate and regular rhythm.        Cardiac rate controlled  Pulmonary/Chest: Breath sounds normal. He has no wheezes.  Abdominal: Bowel sounds are normal. He exhibits  distension. There is no tenderness.  Neurological: He is alert and oriented to person, place, and time. He displays normal reflexes. No cranial nerve deficit. Coordination normal.       Follow simple commands. Diminished LT distally RIght leg. Strength 4+ in the uppers. RLE 3/5. LLE was not tested due to pain  Skin:       Left BKA is dressed. Right heel with a boot . Wounds on the medial lower leg/ankle and heel. Chronic vascular changes noted  Psychiatric: He has a normal mood and affect. His behavior is normal. Judgment and thought content normal.    Results for orders placed during the hospital encounter of 11/24/12 (from the past 24 hour(s))  GLUCOSE, CAPILLARY     Status: Abnormal   Collection Time   12/09/12  4:20 PM      Component Value Range   Glucose-Capillary 175 (*) 70 - 99 mg/dL   Comment 1 Documented in Chart     Comment 2 Notify RN    GLUCOSE, CAPILLARY     Status: Abnormal   Collection Time   12/09/12  9:02 PM      Component Value Range   Glucose-Capillary 169 (*) 70 - 99 mg/dL   Comment 1 Documented in Chart     Comment 2 Notify RN    CBC     Status: Abnormal   Collection Time   12/10/12  3:14 AM      Component Value Range   WBC 11.3 (*) 4.0 - 10.5 K/uL   RBC 3.07 (*) 4.22 - 5.81 MIL/uL   Hemoglobin 8.6 (*) 13.0 - 17.0 g/dL   HCT 81.1 (*) 91.4 - 78.2 %   MCV 87.9  78.0 - 100.0 fL   MCH 28.0  26.0 - 34.0 pg   MCHC 31.9  30.0 - 36.0 g/dL   RDW 95.6 (*) 21.3 - 08.6 %   Platelets 217  150 - 400 K/uL  PROTIME-INR     Status: Abnormal   Collection Time   12/10/12  3:14 AM      Component Value Range   Prothrombin Time 17.1 (*) 11.6 - 15.2 seconds   INR 1.43  0.00 - 1.49  BASIC METABOLIC PANEL     Status: Abnormal   Collection Time   12/10/12  3:14 AM      Component Value Range   Sodium 137  135 - 145 mEq/L   Potassium 4.7  3.5 - 5.1 mEq/L   Chloride 105  96 - 112 mEq/L   CO2 21  19 - 32 mEq/L   Glucose, Bld 177 (*) 70 - 99 mg/dL   BUN 28 (*) 6 - 23 mg/dL    Creatinine, Ser 5.78 (*) 0.50 - 1.35 mg/dL   Calcium 9.2  8.4 - 46.9 mg/dL   GFR calc non Af Amer 37 (*) >90 mL/min   GFR calc Af Amer 43 (*) >90 mL/min  HEPARIN LEVEL (UNFRACTIONATED)  Status: Abnormal   Collection Time   12/10/12  3:14 AM      Component Value Range   Heparin Unfractionated 0.17 (*) 0.30 - 0.70 IU/mL  GLUCOSE, CAPILLARY     Status: Abnormal   Collection Time   12/10/12  6:21 AM      Component Value Range   Glucose-Capillary 176 (*) 70 - 99 mg/dL  GLUCOSE, CAPILLARY     Status: Abnormal   Collection Time   12/10/12 11:18 AM      Component Value Range   Glucose-Capillary 149 (*) 70 - 99 mg/dL   Comment 1 Notify RN     No results found.  Assessment/Plan: Diagnosis: left BKA 1. Does the need for close, 24 hr/day medical supervision in concert with the patient's rehab needs make it unreasonable for this patient to be served in a less intensive setting? Yes 2. Co-Morbidities requiring supervision/potential complications: PAF, DM, CKD, htn, wound care 3. Due to bladder management, bowel management, safety, skin/wound care, disease management, medication administration, pain management and patient education, does the patient require 24 hr/day rehab nursing? Yes 4. Does the patient require coordinated care of a physician, rehab nurse, PT (1-2 hrs/day, 5 days/week) and OT (1-2 hrs/day, 5 days/week) to address physical and functional deficits in the context of the above medical diagnosis(es)? Yes Addressing deficits in the following areas: balance, endurance, locomotion, strength, transferring, bowel/bladder control, bathing, dressing, feeding, grooming, toileting and psychosocial support 5. Can the patient actively participate in an intensive therapy program of at least 3 hrs of therapy per day at least 5 days per week? Yes 6. The potential for patient to make measurable gains while on inpatient rehab is excellent 7. Anticipated functional outcomes upon discharge from inpatient  rehab are wheelchair mod I to supervision with PT, mod I to min assist at a wheelchair level with OT, n/a with SLP. 8. Estimated rehab length of stay to reach the above functional goals is: 10 days+ 9. Does the patient have adequate social supports to accommodate these discharge functional goals? Potentially 10. Anticipated D/C setting: Home 11. Anticipated post D/C treatments: HH therapy 12. Overall Rehab/Functional Prognosis: good  RECOMMENDATIONS: This patient's condition is appropriate for continued rehabilitative care in the following setting: CIR Patient has agreed to participate in recommended program. Yes Note that insurance prior authorization may be required for reimbursement for recommended care.  Comment: Although there are some major architectural barriers to returning home (numerous STE, obstructed doorway), this patient could benefit from inpatient rehab. Rehab RN to follow up. I asked the patient to begin speaking with family regarding solutions for entry into the home via ramp.   Ivory Broad, MD     12/10/2012

## 2012-12-10 NOTE — Consult Note (Signed)
WOC follow up  Wound type: Stage II pressure ulcer right heel Pressure Ulcer POA: No Measurement: unchanged Wound WUJ:WJXBJY filled blister, intact Drainage (amount, consistency, odor) none Periwound:intact Dressing procedure/placement/frequency: continue soft silicone foam dressing to protect from rupture, and Prevalon boot for offloading.   Area of the right medial pretibial area is still erythematous and indurated, now has 1.0cm x 1.0cm area that appears to be consistent with new ulceration formation.  With history of DM and PVD, concerned this new area will worsening. Requested vascular evaluation of this extremity, I can not palpate a pulse well. The extremity is edematous as well.   Will notify orthopedic MD of above and request his opinion. Naquita Nappier [place] RN,CWOCN [phone]

## 2012-12-10 NOTE — Progress Notes (Signed)
Clinical Social Work Department BRIEF PSYCHOSOCIAL ASSESSMENT 12/10/2012  Patient:  [name], [name]     Account Number:  [id number]     Admit date:  11/24/2012  Clinical Social Worker:  Dennison Bulla  Date/Time:  12/10/2012 02:30 PM  Referred by:  Physician  Date Referred:  12/10/2012 Referred for  SNF Placement   Other Referral:   Interview type:  Patient Other interview type:    PSYCHOSOCIAL DATA Living Status:  ALONE Admitted from facility:   Level of care:   Primary support name:  Kim Primary support relationship to patient:  CHILD, ADULT Degree of support available:   Strong    CURRENT CONCERNS Current Concerns  Post-Acute Placement   Other Concerns:    SOCIAL WORK ASSESSMENT / PLAN CSW received referral to assist with dc planning. CSW reviewed chart which stated PT recommending CIR vs SNF at dc. CSW met with patient at bedside. No visitors present.    CSW introduced myself and explained role. Patient reports that he lives alone but his granddtr lives nearby and checks on him often. Patient reports that he is weak from hospital stay and aware of rehab needs. CSW explained CIR vs SNF vs HH. Patient reports he is too weak for [facility]. Patient is undecided regarding SNF vs CIR. Patient lives in [place] and wants to be near family but wants the best care. CSW provided patient with SNF list and explained that SNF and CIR could give information and patient could discuss plans with family. CSW offered to call dtr but patient reports she is working and he will talk with her. CSW explained Medicare coverage for SNF and explained process.    CSW completed FL2 and faxed out to [place], [place]. CSW will follow up with bed offers.   Assessment/plan status:  Psychosocial Support/Ongoing Assessment of Needs Other assessment/ plan:   Information/referral to community resources:   SNF list    PATIENT'S/FAMILY'S RESPONSE TO PLAN OF CARE: Patient alert and oriented. Patient agreeable to CSW  consult and receptive to assessment. Patient engaged and asked appropriate questions. Patient agreeable to CSW follow up.

## 2012-12-10 NOTE — Progress Notes (Addendum)
INITIAL NUTRITION ASSESSMENT  DOCUMENTATION CODES Per approved criteria  -Not Applicable   INTERVENTION:  Glucerna Shake twice daily between meals (220 kcals, 9.9 gm protein per 8 fl oz bottle)  Prostat liquid protein PO twice daily with meals (100 kcals, 15 gm protein per dose) RD to follow for nutrition care plan  NUTRITION DIAGNOSIS: Increased nutrient needs related to post-op/wound healing as evidenced by estimated nutrition needs  Goal: Oral intake with meals & supplements to meet >/= 90% of estimated nutrition needs  Monitor:  PO & supplemental intake, weight, labs, I/O's  Reason for Assessment: wound  78 y.o. male  Admitting Dx: Diabetic foot ulcer  ASSESSMENT: Patient admitted with an infected ulcer on the dorsal aspect of his L foot.  S/p procedure 1/6: AMPUTATION BELOW KNEE (Left)  RD unable to obtain nutrition hx -- patient confused; PO intake variable at 20-75% per flowsheet records; CWOCN note reviewed 1/2; would benefit from addition of nutrition supplements -- RD to order per protocol.  Height: Ht Readings from Last 1 Encounters:  11/24/12 6\' 2"  (1.88 m)    Weight: Wt Readings from Last 1 Encounters:  11/29/12 212 lb 8.4 oz (96.4 kg)    Ideal Body Weight: 81.2 kg ---> adjusted for BKA  % Ideal Body Weight: 118%  Wt Readings from Last 10 Encounters:  11/29/12 212 lb 8.4 oz (96.4 kg)  11/29/12 212 lb 8.4 oz (96.4 kg)  11/29/12 212 lb 8.4 oz (96.4 kg)  07/23/12 200 lb 14.4 oz (91.128 kg)  07/15/12 210 lb (95.255 kg)  06/18/12 216 lb 11.4 oz (98.3 kg)  06/18/12 216 lb 11.4 oz (98.3 kg)  06/18/12 216 lb 11.4 oz (98.3 kg)  05/27/12 238 lb (107.956 kg)  11/14/11 238 lb 15.7 oz (108.4 kg)    Usual Body Weight: 210 lb  % Usual Body Weight: 106%  BMI:  28.9 kg/m2 ---> adjusted for BKA  Estimated Nutritional Needs: Kcal: 2100-2300 Protein: 110-120 gm Fluid: 2.1-2.3 L  Skin: Stage II pressure ulcer to R heel  Diet Order: Carb  Control  EDUCATION NEEDS: -No education needs identified at this time   Intake/Output Summary (Last 24 hours) at 12/10/12 1212 Last data filed at 12/10/12 1100  Gross per 24 hour  Intake 2889.82 ml  Output    850 ml  Net 2039.82 ml    Last BM: 1/5  Labs:   Lab 12/10/12 0314 12/09/12 0435 12/08/12 0458  NA 137 139 136  K 4.7 4.9 3.9  CL 105 109 107  CO2 21 20 21   BUN 28* 31* 35*  CREATININE 1.73* 1.73* 1.92*  CALCIUM 9.2 8.9 9.1  MG -- -- --  PHOS -- -- --  GLUCOSE 177* 160* 96    CBG (last 3)   Basename 12/10/12 1118 12/10/12 0621 12/09/12 2102  GLUCAP 149* 176* 169*    Scheduled Meds:   . allopurinol  100 mg Oral Daily  . carvedilol  25 mg Oral BID WC  . hydrALAZINE  25 mg Oral TID  . insulin aspart  0-15 Units Subcutaneous TID WC  . insulin aspart  0-5 Units Subcutaneous QHS  . insulin glargine  12 Units Subcutaneous BID  . isosorbide dinitrate  20 mg Oral TID  . levothyroxine  50 mcg Oral QAC breakfast  . pantoprazole  40 mg Oral Daily  . simvastatin  40 mg Oral QHS  . Warfarin - Pharmacist Dosing Inpatient   Does not apply q1800    Continuous Infusions:   .  sodium chloride 50 mL/hr at 12/09/12 0301  . heparin 1,200 Units/hr (12/10/12 0539)    Past Medical History  Diagnosis Date  . HTN (hypertension)   . PVD (peripheral vascular disease)   . COPD (chronic obstructive pulmonary disease)   . Diabetes mellitus     IDDM  . S/P CABG x 6 2003    Dr Cornelius Moras, Myoview low risk 2009  . Gout   . Paroxysmal atrial fibrillation   . Hypothyroidism   . ICD (implantable cardiac defibrillator) discharge 5/11    MDT  . Chronic renal insufficiency, stage III (moderate)   . Chronic systolic CHF (congestive heart failure)     EF 25-30% echo 12/12  . S/P angioplasty, 11/27/12 -Lt below the knee popliteal artery 11/28/2012    Past Surgical History  Procedure Date  . Coronary artery bypass graft   . Cardiac catheterization   . Amputation 12/08/2012     Procedure: AMPUTATION BELOW KNEE;  Surgeon: Eldred Manges, MD;  Location: [facility] OR;  Service: Orthopedics;  Laterality: Left;    Kirkland Hun, RD, LDN Pager #: [phone] After-Hours Pager #: [phone]

## 2012-12-11 ENCOUNTER — Inpatient Hospital Stay (HOSPITAL_COMMUNITY)
Admission: RE | Admit: 2012-12-11 | Discharge: 2012-12-24 | DRG: 945 | Disposition: A | Discharge status: Skilled Nursing Facility | Payer: MEDICARE | Source: Intra-hospital | Attending: Physical Medicine & Rehabilitation | Admitting: Physical Medicine & Rehabilitation

## 2012-12-11 DIAGNOSIS — D649 Anemia, unspecified: Secondary | ICD-10-CM

## 2012-12-11 DIAGNOSIS — E039 Hypothyroidism, unspecified: Secondary | ICD-10-CM

## 2012-12-11 DIAGNOSIS — N183 Chronic kidney disease, stage 3 unspecified: Secondary | ICD-10-CM

## 2012-12-11 DIAGNOSIS — L8992 Pressure ulcer of unspecified site, stage 2: Secondary | ICD-10-CM

## 2012-12-11 DIAGNOSIS — Z79899 Other long term (current) drug therapy: Secondary | ICD-10-CM

## 2012-12-11 DIAGNOSIS — L98499 Non-pressure chronic ulcer of skin of other sites with unspecified severity: Secondary | ICD-10-CM

## 2012-12-11 DIAGNOSIS — Z7901 Long term (current) use of anticoagulants: Secondary | ICD-10-CM

## 2012-12-11 DIAGNOSIS — E1142 Type 2 diabetes mellitus with diabetic polyneuropathy: Secondary | ICD-10-CM

## 2012-12-11 DIAGNOSIS — I739 Peripheral vascular disease, unspecified: Secondary | ICD-10-CM

## 2012-12-11 DIAGNOSIS — I5022 Chronic systolic (congestive) heart failure: Secondary | ICD-10-CM

## 2012-12-11 DIAGNOSIS — E1149 Type 2 diabetes mellitus with other diabetic neurological complication: Secondary | ICD-10-CM

## 2012-12-11 DIAGNOSIS — S88119A Complete traumatic amputation at level between knee and ankle, unspecified lower leg, initial encounter: Secondary | ICD-10-CM

## 2012-12-11 DIAGNOSIS — R339 Retention of urine, unspecified: Secondary | ICD-10-CM

## 2012-12-11 DIAGNOSIS — Z794 Long term (current) use of insulin: Secondary | ICD-10-CM

## 2012-12-11 DIAGNOSIS — Z89519 Acquired absence of unspecified leg below knee: Secondary | ICD-10-CM

## 2012-12-11 DIAGNOSIS — Z87891 Personal history of nicotine dependence: Secondary | ICD-10-CM

## 2012-12-11 DIAGNOSIS — I2510 Atherosclerotic heart disease of native coronary artery without angina pectoris: Secondary | ICD-10-CM

## 2012-12-11 DIAGNOSIS — K5900 Constipation, unspecified: Secondary | ICD-10-CM

## 2012-12-11 DIAGNOSIS — I129 Hypertensive chronic kidney disease with stage 1 through stage 4 chronic kidney disease, or unspecified chronic kidney disease: Secondary | ICD-10-CM

## 2012-12-11 DIAGNOSIS — I498 Other specified cardiac arrhythmias: Secondary | ICD-10-CM

## 2012-12-11 DIAGNOSIS — E119 Type 2 diabetes mellitus without complications: Secondary | ICD-10-CM

## 2012-12-11 DIAGNOSIS — N184 Chronic kidney disease, stage 4 (severe): Secondary | ICD-10-CM

## 2012-12-11 DIAGNOSIS — I4891 Unspecified atrial fibrillation: Secondary | ICD-10-CM

## 2012-12-11 DIAGNOSIS — Z95810 Presence of automatic (implantable) cardiac defibrillator: Secondary | ICD-10-CM

## 2012-12-11 DIAGNOSIS — L97809 Non-pressure chronic ulcer of other part of unspecified lower leg with unspecified severity: Secondary | ICD-10-CM

## 2012-12-11 DIAGNOSIS — Z951 Presence of aortocoronary bypass graft: Secondary | ICD-10-CM

## 2012-12-11 DIAGNOSIS — L89609 Pressure ulcer of unspecified heel, unspecified stage: Secondary | ICD-10-CM

## 2012-12-11 DIAGNOSIS — I509 Heart failure, unspecified: Secondary | ICD-10-CM

## 2012-12-11 DIAGNOSIS — Z5189 Encounter for other specified aftercare: Principal | ICD-10-CM

## 2012-12-11 DIAGNOSIS — M109 Gout, unspecified: Secondary | ICD-10-CM

## 2012-12-11 DIAGNOSIS — N189 Chronic kidney disease, unspecified: Secondary | ICD-10-CM

## 2012-12-11 DIAGNOSIS — E785 Hyperlipidemia, unspecified: Secondary | ICD-10-CM

## 2012-12-11 LAB — BASIC METABOLIC PANEL
CO2: 20 mEq/L (ref 19–32)
Glucose, Bld: 215 mg/dL — ABNORMAL HIGH (ref 70–99)
Potassium: 4.7 mEq/L (ref 3.5–5.1)
Sodium: 133 mEq/L — ABNORMAL LOW (ref 135–145)

## 2012-12-11 LAB — CBC
Hemoglobin: 8.3 g/dL — ABNORMAL LOW (ref 13.0–17.0)
MCV: 87.6 fL (ref 78.0–100.0)
Platelets: 181 10*3/uL (ref 150–400)
RBC: 2.98 MIL/uL — ABNORMAL LOW (ref 4.22–5.81)
WBC: 10.9 10*3/uL — ABNORMAL HIGH (ref 4.0–10.5)

## 2012-12-11 LAB — GLUCOSE, CAPILLARY: Glucose-Capillary: 155 mg/dL — ABNORMAL HIGH (ref 70–99)

## 2012-12-11 MED ORDER — OXYCODONE-ACETAMINOPHEN 5-325 MG PO TABS: 1.0000 | ORAL_TABLET | ORAL | Status: DC | PRN

## 2012-12-11 MED ORDER — ENOXAPARIN SODIUM 100 MG/ML ~~LOC~~ SOLN
95.0000 mg | Freq: Two times a day (BID) | SUBCUTANEOUS | Status: DC
  Administered 2012-12-11 – 2012-12-13 (×4): 95 mg via SUBCUTANEOUS
  Filled 2012-12-11 (×6): qty 1

## 2012-12-11 MED ORDER — WARFARIN SODIUM 5 MG PO TABS
5.0000 mg | ORAL_TABLET | Freq: Once | ORAL | Status: AC
  Administered 2012-12-11: 5 mg via ORAL
  Filled 2012-12-11: qty 1

## 2012-12-11 MED ORDER — FUROSEMIDE 80 MG PO TABS
80.0000 mg | ORAL_TABLET | Freq: Every day | ORAL | Status: DC
  Administered 2012-12-12 – 2012-12-14 (×3): 80 mg via ORAL
  Filled 2012-12-11 (×5): qty 1

## 2012-12-11 MED ORDER — GLUCERNA SHAKE PO LIQD: 237.0000 mL | Freq: Two times a day (BID) | ORAL | Status: DC

## 2012-12-11 MED ORDER — WARFARIN SODIUM 5 MG PO TABS
5.0000 mg | ORAL_TABLET | Freq: Once | ORAL | Status: DC
  Filled 2012-12-11: qty 1

## 2012-12-11 MED ORDER — POLYETHYLENE GLYCOL 3350 17 G PO PACK: 17.0000 g | PACK | Freq: Two times a day (BID) | ORAL | Status: DC

## 2012-12-11 MED ORDER — ONDANSETRON HCL 4 MG PO TABS: 4.0000 mg | ORAL_TABLET | Freq: Four times a day (QID) | ORAL | Status: DC | PRN

## 2012-12-11 MED ORDER — SIMVASTATIN 40 MG PO TABS
40.0000 mg | ORAL_TABLET | Freq: Every day | ORAL | Status: DC
  Administered 2012-12-11 – 2012-12-23 (×13): 40 mg via ORAL
  Filled 2012-12-11 (×14): qty 1

## 2012-12-11 MED ORDER — METOCLOPRAMIDE HCL 5 MG PO TABS: 5.0000 mg | ORAL_TABLET | Freq: Three times a day (TID) | ORAL | Status: DC | PRN

## 2012-12-11 MED ORDER — OXYCODONE-ACETAMINOPHEN 5-325 MG PO TABS
1.0000 | ORAL_TABLET | ORAL | Status: DC | PRN
  Administered 2012-12-11 (×2): 2 via ORAL
  Administered 2012-12-12: 1 via ORAL
  Administered 2012-12-12 (×2): 2 via ORAL
  Administered 2012-12-12: 1 via ORAL
  Administered 2012-12-13 – 2012-12-21 (×21): 2 via ORAL
  Filled 2012-12-11 (×17): qty 2
  Filled 2012-12-11: qty 1
  Filled 2012-12-11 (×2): qty 2
  Filled 2012-12-11: qty 1
  Filled 2012-12-11 (×6): qty 2

## 2012-12-11 MED ORDER — PRO-STAT SUGAR FREE PO LIQD
30.0000 mL | Freq: Two times a day (BID) | ORAL | Status: DC
  Administered 2012-12-12 – 2012-12-24 (×23): 30 mL via ORAL
  Filled 2012-12-11 (×26): qty 30

## 2012-12-11 MED ORDER — PANTOPRAZOLE SODIUM 40 MG PO TBEC
40.0000 mg | DELAYED_RELEASE_TABLET | Freq: Every day | ORAL | Status: DC
  Administered 2012-12-12 – 2012-12-24 (×13): 40 mg via ORAL
  Filled 2012-12-11 (×14): qty 1

## 2012-12-11 MED ORDER — ALLOPURINOL 100 MG PO TABS
100.0000 mg | ORAL_TABLET | Freq: Every day | ORAL | Status: DC
  Administered 2012-12-12 – 2012-12-14 (×3): 100 mg via ORAL
  Filled 2012-12-11 (×6): qty 1

## 2012-12-11 MED ORDER — CARVEDILOL 25 MG PO TABS
25.0000 mg | ORAL_TABLET | Freq: Two times a day (BID) | ORAL | Status: DC
  Administered 2012-12-12 (×2): 25 mg via ORAL
  Filled 2012-12-11 (×7): qty 1

## 2012-12-11 MED ORDER — ENOXAPARIN SODIUM 100 MG/ML ~~LOC~~ SOLN: 95.0000 mg | Freq: Two times a day (BID) | SUBCUTANEOUS | Status: DC

## 2012-12-11 MED ORDER — ACETAMINOPHEN 325 MG PO TABS
325.0000 mg | ORAL_TABLET | ORAL | Status: DC | PRN
  Administered 2012-12-22 (×2): 650 mg via ORAL
  Filled 2012-12-11 (×2): qty 2

## 2012-12-11 MED ORDER — LEVOTHYROXINE SODIUM 50 MCG PO TABS
50.0000 ug | ORAL_TABLET | Freq: Every day | ORAL | Status: DC
  Administered 2012-12-12 – 2012-12-24 (×13): 50 ug via ORAL
  Filled 2012-12-11 (×15): qty 1

## 2012-12-11 MED ORDER — WARFARIN SODIUM 7.5 MG PO TABS: 7.5000 mg | ORAL_TABLET | Freq: Once | ORAL | Status: DC

## 2012-12-11 MED ORDER — WARFARIN - PHARMACIST DOSING INPATIENT
Freq: Every day | Status: DC
  Administered 2012-12-11: 20:00:00

## 2012-12-11 MED ORDER — INSULIN ASPART 100 UNIT/ML ~~LOC~~ SOLN
0.0000 [IU] | Freq: Three times a day (TID) | SUBCUTANEOUS | Status: DC
  Administered 2012-12-12: 3 [IU] via SUBCUTANEOUS
  Administered 2012-12-12 (×2): 2 [IU] via SUBCUTANEOUS
  Administered 2012-12-13 (×2): 3 [IU] via SUBCUTANEOUS
  Administered 2012-12-13 – 2012-12-14 (×2): 2 [IU] via SUBCUTANEOUS
  Administered 2012-12-14: 3 [IU] via SUBCUTANEOUS
  Administered 2012-12-14: 2 [IU] via SUBCUTANEOUS
  Administered 2012-12-15 (×3): 3 [IU] via SUBCUTANEOUS
  Administered 2012-12-16: 5 [IU] via SUBCUTANEOUS
  Administered 2012-12-16: 2 [IU] via SUBCUTANEOUS
  Administered 2012-12-16 – 2012-12-17 (×2): 3 [IU] via SUBCUTANEOUS
  Administered 2012-12-17 (×2): 5 [IU] via SUBCUTANEOUS
  Administered 2012-12-18: 3 [IU] via SUBCUTANEOUS
  Administered 2012-12-18 (×2): 5 [IU] via SUBCUTANEOUS
  Administered 2012-12-19: 3 [IU] via SUBCUTANEOUS
  Administered 2012-12-19: 2 [IU] via SUBCUTANEOUS
  Administered 2012-12-19: 3 [IU] via SUBCUTANEOUS
  Administered 2012-12-20: 5 [IU] via SUBCUTANEOUS
  Administered 2012-12-20 – 2012-12-21 (×2): 3 [IU] via SUBCUTANEOUS
  Administered 2012-12-21: 2 [IU] via SUBCUTANEOUS
  Administered 2012-12-22 (×2): 3 [IU] via SUBCUTANEOUS
  Administered 2012-12-22 – 2012-12-24 (×4): 2 [IU] via SUBCUTANEOUS

## 2012-12-11 MED ORDER — INSULIN GLARGINE 100 UNIT/ML ~~LOC~~ SOLN
12.0000 [IU] | Freq: Two times a day (BID) | SUBCUTANEOUS | Status: DC
  Administered 2012-12-11 – 2012-12-18 (×15): 12 [IU] via SUBCUTANEOUS

## 2012-12-11 MED ORDER — METHOCARBAMOL 500 MG PO TABS: 500.0000 mg | ORAL_TABLET | Freq: Four times a day (QID) | ORAL | Status: DC | PRN

## 2012-12-11 MED ORDER — SORBITOL 70 % SOLN
30.0000 mL | Freq: Every day | Status: DC | PRN
  Administered 2012-12-11: 15 mL via ORAL
  Administered 2012-12-23: 30 mL via ORAL
  Filled 2012-12-11 (×4): qty 30

## 2012-12-11 MED ORDER — INSULIN GLARGINE 100 UNIT/ML ~~LOC~~ SOLN: 12.0000 [IU] | Freq: Two times a day (BID) | SUBCUTANEOUS | Status: DC

## 2012-12-11 MED ORDER — HYDRALAZINE HCL 25 MG PO TABS
25.0000 mg | ORAL_TABLET | Freq: Three times a day (TID) | ORAL | Status: DC
  Administered 2012-12-11 – 2012-12-24 (×38): 25 mg via ORAL
  Filled 2012-12-11 (×43): qty 1

## 2012-12-11 MED ORDER — GLUCERNA SHAKE PO LIQD
237.0000 mL | Freq: Two times a day (BID) | ORAL | Status: DC
  Administered 2012-12-12 – 2012-12-24 (×22): 237 mL via ORAL
  Filled 2012-12-11: qty 237

## 2012-12-11 MED ORDER — ISOSORBIDE DINITRATE 20 MG PO TABS
20.0000 mg | ORAL_TABLET | Freq: Three times a day (TID) | ORAL | Status: DC
  Administered 2012-12-11 – 2012-12-24 (×39): 20 mg via ORAL
  Filled 2012-12-11 (×42): qty 1

## 2012-12-11 MED ORDER — POLYETHYLENE GLYCOL 3350 17 G PO PACK
17.0000 g | PACK | Freq: Every day | ORAL | Status: DC
  Administered 2012-12-12 – 2012-12-22 (×9): 17 g via ORAL
  Filled 2012-12-11 (×14): qty 1

## 2012-12-11 MED ORDER — METHOCARBAMOL 500 MG PO TABS
500.0000 mg | ORAL_TABLET | Freq: Four times a day (QID) | ORAL | Status: DC | PRN
  Administered 2012-12-12 – 2012-12-22 (×9): 500 mg via ORAL
  Filled 2012-12-11 (×9): qty 1

## 2012-12-11 MED ORDER — ONDANSETRON HCL 4 MG/2ML IJ SOLN: 4.0000 mg | Freq: Four times a day (QID) | INTRAMUSCULAR | Status: DC | PRN

## 2012-12-11 NOTE — Plan of Care (Signed)
Overall Plan of Care [facility]) Patient Details Name: [name] MRN: [id number] DOB: 02-23-1935  Diagnosis:  Rehabilitation for left below-knee amputation  Primary Diagnosis:    S/P unilateral BKA (below knee amputation) Co-morbidities: coronary artery disease, severe peripheral vascular disease, severe diabetic polyneuropathy  Functional Problem List  Patient demonstrates impairments in the following areas: Balance, Bladder, Bowel, Edema, Endurance, Medication Management, Pain, Safety, Sensory  and Skin Integrity  Basic ADL's: bathing, dressing and toileting Advanced ADL's: not applicable at this time  Transfers:  bed mobility, bed to chair, toilet, tub/shower, car and furniture Locomotion:  wheelchair mobility  Additional Impairments:  Functional use of upper extremity  Anticipated Outcomes Item Anticipated Outcome  Eating/Swallowing  Mod independent  Basic self-care  Mod I  Tolieting  Mod I  Bowel/Bladder  Continent with toileting min assist  Transfers  Mod I to drop arm BSC, supervision to tub bench Mod I basic transfers; min assist car  Locomotion  Mod I wheelchair mobility in controlled/home environment  Communication    Cognition    Pain  3 or less on scale of 1-10  Safety/Judgment  supervision  Other     Therapy Plan: PT Intensity: Minimum of 2-3x/day, 45 to 90 minutes PT Frequency: 5 out of 7 days PT Duration Estimated Length of Stay: 2 weeks OT Intensity: Minimum of 1-2 x/day, 45 to 90 minutes OT Frequency: 5 out of 7 days OT Duration/Estimated Length of Stay: 2 weeks      Team Interventions: Item RN PT OT SLP SW TR Other  Self Care/Advanced ADL Retraining   x      Neuromuscular Re-Education         Therapeutic Activities  x x      UE/LE Strength Training/ROM  x x      UE/LE Coordination Activities         Visual/Perceptual Remediation/Compensation         DME/Adaptive Equipment Instruction  x x      Therapeutic Exercise  x x        Balance/Vestibular Training  x x      Patient/Family Education x x x      Cognitive Remediation/Compensation         Functional Mobility Training  x x      Ambulation/Gait Training  x       Engineer, structural Propulsion/Positioning  x       Energy manager         Bladder Management x        Bowel Management x        Disease Management/Prevention x        Pain Management x        Medication Management x        Skin Care/Wound Management x        Splinting/Orthotics         Discharge Planning x x x      Psychosocial Support x                           Team Discharge Planning: Destination: PT-Home ,OT- Home , SLP-  Projected Follow-up: PT-Home health PT, OT-  Home health OT, SLP-  Projected Equipment Needs:  PT-Wheelchair (measurements);Wheelchair cushion (measurements), OT- Tub/shower bench;3 in 1 bedside comode, SLP-  Patient/family involved in discharge planning: PT- Patient,  OT-Patient, SLP-   MD ELOS: 7-10 days Medical Rehab Prognosis:  Good Assessment: 78 year old male with severe peripheral vascular disease underwent left BKA and now is admitted to [facility] program. Rehabilitation team will consist of PT OT social work M.D. And RN working on bowel and bladder issues, wound care, pain management, mobility, ADLs, patient education    See Team Conference Notes for weekly updates to the plan of care

## 2012-12-11 NOTE — Discharge Summary (Signed)
Physician Discharge Summary  Patient ID: [name] MRN: [id number] DOB/AGE: 1935/02/10 78 y.o.  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 Active Problems:  Hx of CABG x 6 2003  ICD in place, MDT May 2011  PAF (paroxysmal atrial fibrillation)  Diabetes mellitus type 2, insulin dependent  CKD (chronic kidney disease) stage 4, GFR 15-29 ml/min  Positive Coombs test  Anemia due to chronic illness  HTN (hypertension)  Ischemic cardiomyopathy, EF 25% 2D 12/12  PVD, Lt popliteal HSRA 7/13  Dyslipidemia  Chronic anticoagulation  Non-sustained ventricular tachycardia  Sleep apnea, C-Pap intol  Gout  Hypothyroidism  Chronic systolic CHF (congestive heart failure)   Discharged Condition: stable  Hospital Course: The patient is a 78 year old male and patient of Dr. Allyson Sabal.  He is a diabetic vasculopath who was admitted on 11/24/12 with an infected non-healing ulcer, on the dorsal aspect of his left foot.  He has a past history of critical limb ischemia and a non-healing ulcer of the left lower extremity. On June 07, 2012, he underwent Diamondback orbital rotational atherectomy and AngioSculpt cutting balloon atherectomy, of a totally occluded below-the-knee popliteal artery and anterior tibial artery, for percutaneous revascularization. He had follow-up with Dr. Allyson Sabal, in August 2013, and post operative dopplers revealed a widely patent popliteal and anterior tibial vessels.  His posterior tibial was occluded.  After his procedure in 2013, his left heel ulcer ultimately healed. However, on his most recent office follow-up with Dr. Allyson Sabal, on 11/19/12, he was found to have developed a new ischemic looking ulcer on the top of his left foot. The ulcer was apparently being treated by Dr. Aldean Baker. Dr. Allyson Sabal ordered a repeat  doppler study on, 12/20, which suggested that the previously opened left popliteal artery had re-occluded. On exam, the patient's foot also appeared infected. He was sent by Dr. Allyson Sabal to [facility] for ID consult and PV angiogram. The PV angiogram was delayed several days due to a high INR (on coumadin for PAF).  In the interim, ID and the wound clinic were consulted for recommendation and wound management. The angiogram, performed on 12/26, revealed moderate restenosis within the popliteal artery, below the knee, with a widely patent anterior tibial down to the ankle. The posterior tibial and peroneal were chronically occluded. Angioscope intervention of the restenosed segment was performed without stenting. Despite the successful angioplasty, a consensus was reached between the consulting services that either a BKA or AKA would need to be considered to prevent further spread of infection/osteomyelitis. Ortho was consulted, and they agreed that a BKA would be the best intervention.  On 12/09/12, he underwent a left BKA. The orthopedic surgeon was Dr. Ophelia Charter. There were no operative/post operative complications and the patient was in stable condition.  However, prior to his surgery, the patient was found to have developed a new stage II pressure ulcer on the heel of his right lower extremity.  WOC managed the ulcer accordingly with dressings and Prevalon boots for offloading. On POD #2, he was placed on heparin for bridging back to coumadin, due to a subtherapeutic INR. PT was consulted and recommended placement for inpatient rehab. Right PT and DP pulses were audible on doppler on 12/11/2012. He was last seen and examined by Dr. Allyson Sabal, who felt he was stable for discharge to CIR. OP follow up will be arranged once patient  is discharged from CIR.     Consults: ID, orthopedic surgery, wound clinic and physical medicine/rehabilitation   Significant Diagnostic Studies:   CT of Left Ankle w/o Contrast 11/25/12 RADIOLOGY  REPORT*  Clinical Data: Left ankle redness and swelling.  CT OF THE LEFT ANKLE WITHOUT CONTRAST  Technique: Multidetector CT imaging was performed according to the  standard protocol. Multiplanar CT image reconstructions were also  generated.  Comparison: CT scan left foot 05/29/2012.  Findings: Diffuse and marked subcutaneous edema is present. Small  locules of gas are seen in the cutaneous soft tissues anterior to  the tibiotalar joint are compatible with a skin ulceration or  laceration. No focal fluid collection or deep soft tissue gas  collection is identified. Innumerable erosions are present about  the ankle and foot with scattered areas of calcification consistent  with multi focal gouty arthritis. The appearance is not markedly  changed. There is no fracture or dislocation.  IMPRESSION:  1. Diffuse and intense subcutaneous edema without focal fluid  collection. Small cutaneous gas locules anterior to the tibiotalar  joint compatible with a skin ulceration or laceration.  2. Changes of severe multifocal gouty arthritis. The appearance  of the visualized bones is unchanged. No finding to suggest  osteomyelitis is identified.  Original Report Authenticated By: Holley Dexter, M.D.   Angiography of LLE 11/27/12 HEMODYNAMICS:  AO SYSTOLIC/AO DIASTOLIC: 140/70  ANGIOGRAPHIC RESULTS:  1: Left lower extremity-the short stent in the proximal left SFA was patent. The entire left SFA was fluoroscopic calcified with obvious diffuse filling defects with 50% segmental stenosis throughout its entirety. The below the knee popliteal artery which was previously atherectomized and endoplastic with 70-80% diffusely narrowed. The anterior tibial artery was patent down to the ankle which time it was abruptly occluded. The posterior tibial and peroneal were occluded as well.  IMPRESSION:moderate restenosis within the below the knee popliteal artery with a widely patent anterior tibial down to the  ankle. The posterior tibial and peroneal are chronically occluded. We'll proceed with Angiosculpt intervention of the below the knee popliteal.  Procedure description: Contralateral access was obtained with a 6 [place] destination sheath. The ACT was documented to 41. Total contrast administered the patient was 93 cc. Using an 014/300 [place] wire and an 018 quick cross end hole catheter the entire SFA was traversed down to the below-knee popliteal into the anterior tibial artery. Intervention was then performed with 4 mm x 4 cm long Angiosculpt balloons with overlapping inflations at 8 atmospheres resulting in reduction of a long 75-80% segmental stenosis to less than 20% residual stenosis with 2 small dissections but did not appear to be flow limiting.  Overall impression: Excess full angioscope intervention of the segmental restenosis of the knee below the knee popliteal in the setting of critical limb ischemia with an excellent antibiotic result not requiring stenting. The patient will need to be re heparinized because his A. Fib and consideration given to either recurrent coumadinization and aggressive local wound care versus limited amputation.  Runell Gess MD, [facility]  11/27/2012  3:54 PM   CBC    Component Value Date/Time   WBC 10.9* 12/11/2012 0415   WBC 7.9 04/13/2008 0854   RBC 2.98* 12/11/2012 0415   RBC 4.14* 04/13/2008 0854   HGB 8.3* 12/11/2012 0415   HGB 12.3* 04/13/2008 0854   HCT 26.1* 12/11/2012 0415   HCT 35.7* 04/13/2008 0854   PLT 181 12/11/2012 0415   PLT 181 04/13/2008 0854   MCV 87.6 12/11/2012  0415   MCV 86.1 04/13/2008 0854   MCH 27.9 12/11/2012 0415   MCH 29.6 04/13/2008 0854   MCHC 31.8 12/11/2012 0415   MCHC 34.4 04/13/2008 0854   RDW 17.8* 12/11/2012 0415   RDW 18.0* 04/13/2008 0854   LYMPHSABS 1.6 11/24/2012 1758   LYMPHSABS 1.5 04/13/2008 0854   MONOABS 0.8 11/24/2012 1758   MONOABS 0.6 04/13/2008 0854   EOSABS 0.2 11/24/2012 1758   EOSABS 0.2 04/13/2008 0854   BASOSABS 0.0  11/24/2012 1758   BASOSABS 0.1 04/13/2008 0854    BMET    Component Value Date/Time   NA 133* 12/11/2012 0415   K 4.7 12/11/2012 0415   CL 105 12/11/2012 0415   CO2 20 12/11/2012 0415   GLUCOSE 215* 12/11/2012 0415   BUN 29* 12/11/2012 0415   CREATININE 1.63* 12/11/2012 0415   CALCIUM 9.1 12/11/2012 0415   GFRNONAA 40* 12/11/2012 0415   GFRAA 46* 12/11/2012 0415     Treatments: See Hospital Course  Discharge Exam: Blood pressure 121/84, pulse 72, temperature 97.9 F (36.6 C), temperature source Oral, resp. rate 18, height 6\' 2"  (1.88 m), weight 96.4 kg (212 lb 8.4 oz), SpO2 97.00%.   Disposition: 06-Home-Health Care Svc  Discharge Orders    Future Orders Please Complete By Expires   Diet - low sodium heart healthy      Increase activity slowly          Medication List     As of 12/11/2012  1:48 PM    STOP taking these medications         metolazone 2.5 MG tablet   Commonly known as: ZAROXOLYN      TAKE these medications         allopurinol 100 MG tablet   Commonly known as: ZYLOPRIM   Take 1 tablet (100 mg total) by mouth daily.      carvedilol 25 MG tablet   Commonly known as: COREG   Take 1 tablet (25 mg total) by mouth 2 (two) times daily with a meal.      enoxaparin 100 MG/ML injection   Commonly known as: LOVENOX   Inject 0.95 mLs (95 mg total) into the skin every 12 (twelve) hours.      feeding supplement Liqd   Take 237 mLs by mouth 2 (two) times daily between meals.      furosemide 80 MG tablet   Commonly known as: LASIX   Take 80 mg by mouth daily.      hydrALAZINE 25 MG tablet   Commonly known as: APRESOLINE   Take 25 mg by mouth 3 (three) times daily.      insulin aspart 100 UNIT/ML injection   Commonly known as: novoLOG   Inject 10-15 Units into the skin 3 (three) times daily before meals. Per sliding scale      insulin glargine 100 UNIT/ML injection   Commonly known as: LANTUS   Inject 12 Units into the skin 2 (two) times daily.      isosorbide  dinitrate 20 MG tablet   Commonly known as: ISORDIL   Take 20 mg by mouth 3 (three) times daily.      levothyroxine 50 MCG tablet   Commonly known as: SYNTHROID, LEVOTHROID   Take 50 mcg by mouth every morning.      methocarbamol 500 MG tablet   Commonly known as: ROBAXIN   Take 1 tablet (500 mg total) by mouth every 6 (six) hours as needed.  metoCLOPramide 5 MG tablet   Commonly known as: REGLAN   Take 1-2 tablets (5-10 mg total) by mouth every 8 (eight) hours as needed (if ondansetron (ZOFRAN) ineffective.).      omeprazole 20 MG capsule   Commonly known as: PRILOSEC   Take 40 mg by mouth every morning.      oxyCODONE-acetaminophen 5-325 MG per tablet   Commonly known as: PERCOCET/ROXICET   Take 1-2 tablets by mouth every 4 (four) hours as needed.      polyethylene glycol packet   Commonly known as: MIRALAX / GLYCOLAX   Take 17 g by mouth 2 (two) times daily.      potassium chloride SA 20 MEQ tablet   Commonly known as: K-DUR,KLOR-CON   Take 20 mEq by mouth 2 (two) times daily.      simvastatin 40 MG tablet   Commonly known as: ZOCOR   Take 40 mg by mouth at bedtime.      warfarin 5 MG tablet   Commonly known as: COUMADIN   Take 2.5-5 mg by mouth every evening. Takes one tablet (5mg ) every day except on Tuesdays and Fridays, take one-half tablet (2.5mg )         Signed: Brysen Shankman 12/11/2012, 1:48 PM

## 2012-12-11 NOTE — Progress Notes (Signed)
Physical Therapy Treatment Patient Details Name: [name] MRN: [id number] DOB: 1935-11-26 Today's Date: 12/11/2012 Time: 2952-8413 PT Time Calculation (min): 31 min  PT Assessment / Plan / Recommendation Comments on Treatment Session  L knee flexion limited to approx 40*, R knee buckles with walking, pt unsteady while walking requiring +2 total assist for balance.     Follow Up Recommendations  CIR     Does the patient have the potential to tolerate intense rehabilitation     Barriers to Discharge        Equipment Recommendations  Wheelchair (measurements PT);Wheelchair cushion (measurements PT)    Recommendations for Other Services    Frequency Min 4X/week   Plan Discharge plan remains appropriate;Frequency remains appropriate    Precautions / Restrictions Precautions Precautions: Fall Precaution Comments: +2 assist required for safety with transfers/walking Restrictions Weight Bearing Restrictions: No   Pertinent Vitals/Pain *"10+10 pain" LLE Pt premedicated, BLEs elevated after PT**    Mobility  Bed Mobility Bed Mobility: Supine to Sit Supine to Sit: HOB elevated;With rails;4: Min assist Details for Bed Mobility Assistance: min A for elevating trunk, increased effort and time Transfers Transfers: Stand to Sit;Sit to Stand Sit to Stand: 1: +2 Total assist;From bed;With armrests;From chair/3-in-1 Sit to Stand: Patient Percentage: 50% Stand to Sit: To chair/3-in-1;With upper extremity assist;1: +2 Total assist Stand to Sit: Patient Percentage: 60% Details for Transfer Assistance: verbal cues for safe technique, assist to rise and steady as well as control descent Ambulation/Gait Ambulation/Gait Assistance: 1: +2 Total assist Ambulation/Gait: Patient Percentage: 50% Ambulation Distance (Feet): 10 Feet (5' x 2 with seated rest break) Assistive device: Rolling walker Gait Pattern: Step-to pattern Gait velocity: decreased General Gait Details: increased effort,  R knee flexed, +2 for balance and to maintain safe distance from RW    Exercises Total Joint Exercises Towel Squeeze: AROM;Both;10 reps General Exercises - Lower Extremity Quad Sets: AROM;Left;10 reps;Supine Short Arc Quad: AROM;Left;10 reps Hip ABduction/ADduction: AAROM;Left;10 reps;Supine Straight Leg Raises: AROM;Left;10 reps;Supine   PT Diagnosis:    PT Problem List:   PT Treatment Interventions:     PT Goals Acute Rehab PT Goals PT Goal Formulation: With patient Time For Goal Achievement: 12/23/12 Potential to Achieve Goals: Good Pt will go Sit to Stand: with supervision PT Goal: Sit to Stand - Progress: Progressing toward goal Pt will go Stand to Sit: with supervision PT Goal: Stand to Sit - Progress: Progressing toward goal Pt will Transfer Bed to Chair/Chair to Bed: with supervision Pt will Ambulate: 16 - 50 feet;with supervision;with least restrictive assistive device PT Goal: Ambulate - Progress: Progressing toward goal Pt will Propel Wheelchair: 51 - 150 feet;with modified independence  Visit Information  Last PT Received On: 12/11/12 Assistance Needed: +2 PT/OT Co-Evaluation/Treatment: Yes    Subjective Data  Subjective: The pain is 10+10.  Patient Stated Goal: go to CIR   Cognition  Overall Cognitive Status: Appears within functional limits for tasks assessed/performed Arousal/Alertness: Awake/alert Orientation Level: Appears intact for tasks assessed Behavior During Session: [facility] for tasks performed    Balance  Balance Balance Assessed: Yes Static Standing Balance Static Standing - Balance Support: Bilateral upper extremity supported Static Standing - Level of Assistance: 1: +2 Total assist;Other (comment) (60% with verbal cues for posture, UE assist through RW)  End of Session PT - End of Session Equipment Utilized During Treatment: Gait belt Activity Tolerance: Patient limited by pain;Patient limited by fatigue Patient left: in chair;with call  bell/phone within reach Nurse Communication: Mobility status  GP     Ralene Bathe Kistler 12/11/2012, 11:50 AM [phone]

## 2012-12-11 NOTE — Consult Note (Signed)
WOC currently following pt weekly to monitor wound right heel and right medial ankle. Need vascular to evaluate medial malleolar wound and current vascular status.  See consult notes, I have reentered wound care orders for rehab. Cruzito Standre Cavour RN,CWOCN [phone]

## 2012-12-11 NOTE — Progress Notes (Signed)
ANTICOAGULATION CONSULT NOTE - Follow Up Consult  Pharmacy Consult for Coumadin & Lovenox Indication: Afib, PVD  Patient Measurements: Height: 6\' 2"  (188 cm) Weight: 212 lb 8.4 oz (96.4 kg) IBW/kg (Calculated) : 82.2   Vital Signs: Temp: 97.9 F (36.6 C) (01/09 0618) Temp src: Oral (01/09 0618) BP: 121/84 mmHg (01/09 0618)  Labs:  Basename 12/11/12 0415 12/10/12 0314 12/09/12 0435  HGB 8.3* 8.6* --  HCT 26.1* 27.0* 27.0*  PLT 181 217 207  APTT -- -- --  LABPROT 17.5* 17.1* 16.3*  INR 1.48 1.43 1.34  HEPARINUNFRC -- 0.17* --  CREATININE 1.63* 1.73* 1.73*  CKTOTAL -- -- --  CKMB -- -- --  TROPONINI -- -- --   Estimated Creatinine Clearance: 45.5 ml/min (by C-G formula based on Cr of 1.63).  Assessment: 78yo male on Lovenox bridge to therapeutic INR on Coumadin for Afib and PVD. Coumadin restarted 12/10/11 s/p BKA. INR remains sub-therapeutic at 1.48 as expected after only 2 doses. Reported home regimen is Coumadin 5mg  daily except 2.5mg  Tue/Fri. No bleeding reported in chart.  Goal of Therapy:  INR 2-3 Anti-Xa level 0.6-1.2 units/ml 4hrs after LMWH dose given Monitor platelets by anticoagulation protocol: Yes   Plan:  1) Continue Lovenox 95 mg SQ Q12 hours  2) Coumadin 5mg  x 1 tonight 3) Follow up daily INR & signs/symptoms of bleeding  Benjaman Pott, PharmD    12/11/2012   12:28 PM

## 2012-12-11 NOTE — Progress Notes (Signed)
Patient admitted to room 4028, oriented to room,bed control, white board, therapy schedule,safety plan, video, agreement, CMS data sheet, limb loss education booklet, preferred name and goal, see CHL for details of assessment.patient admitted with fluid filed blister to right heel and right lower leg ulceration, appears vascular in nature, both areas covered with Allevyn, WOC consult ordered. [name], Loel Betancur Elon Jester

## 2012-12-11 NOTE — Progress Notes (Signed)
[facility]  Subjective: Pain in the part of his leg that was removed.  Objective: Vital signs in last 24 hours: Temp:  [97.9 F (36.6 C)-100.8 F (38.2 C)] 97.9 F (36.6 C) (01/09 0618) Pulse Rate:  [52-72] 72  (01/08 1949) Resp:  [17-18] 18  (01/08 1949) BP: (120-150)/(56-84) 121/84 mmHg (01/09 0618) SpO2:  [95 %-97 %] 97 % (01/09 0618) Last BM Date: 12/07/12  Intake/Output from previous day: 01/08 0701 - 01/09 0700 In: 480 [P.O.:480] Out: 900 [Urine:900] Intake/Output this shift: Total I/O In: 240 [P.O.:240] Out: 300 [Urine:300]  Medications Current Facility-Administered Medications  Medication Dose Route Frequency Provider Last Rate Last Dose  . 0.45 % sodium chloride infusion   Intravenous Continuous Eldred Manges, MD 50 mL/hr at 12/09/12 0301    . acetaminophen (TYLENOL) tablet 650 mg  650 mg Oral Q4H PRN Abelino Derrick, PA   650 mg at 12/10/12 2001  . allopurinol (ZYLOPRIM) tablet 100 mg  100 mg Oral Daily Eda Paschal [place], [place]   100 mg at 12/10/12 1034  . carvedilol (COREG) tablet 25 mg  25 mg Oral BID WC Eda Paschal [place], PA   25 mg at 12/10/12 1557  . docusate sodium (COLACE) capsule 200 mg  200 mg Oral Daily Runell Gess, MD   200 mg at 12/10/12 1556  . enoxaparin (LOVENOX) injection 95 mg  95 mg Subcutaneous Q12H Dannielle Karvonen [place], PHARMD   95 mg at 12/11/12 4782  . feeding supplement (GLUCERNA SHAKE) liquid 237 mL  237 mL Oral BID BM Ailene Ards, RD   237 mL at 12/10/12 1448  . feeding supplement (PRO-STAT SUGAR FREE 64) liquid 30 mL  30 mL Oral BID WC Ailene Ards, RD   30 mL at 12/10/12 1704  . furosemide (LASIX) tablet 80 mg  80 mg Oral Daily Nada Boozer, NP   80 mg at 12/10/12 1447  . hydrALAZINE (APRESOLINE) tablet 25 mg  25 mg Oral TID Abelino Derrick, PA   25 mg at 12/10/12 2113  . HYDROmorphone (DILAUDID) injection 0.5-1 mg  0.5-1 mg Intravenous Q2H PRN Eldred Manges, MD   1 mg at 12/10/12 9562  . insulin  aspart (novoLOG) injection 0-15 Units  0-15 Units Subcutaneous TID [facility] Eda Paschal [place], [place]   3 Units at 12/11/12 [phone]  . insulin aspart (novoLOG) injection 0-5 Units  0-5 Units Subcutaneous QHS Eda Paschal [place], [place]   2 Units at 11/30/12 2203  . insulin glargine (LANTUS) injection 12 Units  12 Units Subcutaneous BID Marykay Lex, MD   12 Units at 12/10/12 2203  . isosorbide dinitrate (ISORDIL) tablet 20 mg  20 mg Oral TID Abelino Derrick, PA   20 mg at 12/10/12 2113  . levothyroxine (SYNTHROID, LEVOTHROID) tablet 50 mcg  50 mcg Oral QAC breakfast Eda Paschal [place], [place]   50 mcg at 12/11/12 6578  . meperidine (DEMEROL) injection 6.25-12.5 mg  6.25-12.5 mg Intravenous Q5 min PRN E. Jairo Ben, MD      . methocarbamol (ROBAXIN) tablet 500 mg  500 mg Oral Q6H PRN Eldred Manges, MD   500 mg at 12/10/12 2113  . metoCLOPramide (REGLAN) tablet 5-10 mg  5-10 mg Oral Q8H PRN Eldred Manges, MD      . morphine 2 MG/ML injection 2 mg  2 mg Intravenous Q1H PRN Runell Gess, MD   2 mg at 11/27/12 2045  . ondansetron (ZOFRAN) injection  4 mg  4 mg Intravenous Q6H PRN Abelino Derrick, PA   4 mg at 11/27/12 2045  . ondansetron (ZOFRAN) tablet 4 mg  4 mg Oral Q6H PRN Eldred Manges, MD       Or  . ondansetron [facility]) injection 4 mg  4 mg Intravenous Q6H PRN Eldred Manges, MD      . oxyCODONE-acetaminophen (PERCOCET/ROXICET) 5-325 MG per tablet 1-2 tablet  1-2 tablet Oral Q4H PRN Eldred Manges, MD   2 tablet at 12/11/12 0317  . pantoprazole (PROTONIX) EC tablet 40 mg  40 mg Oral Daily Eda Paschal [place], [place]   40 mg at 12/10/12 1035  . polyethylene glycol (MIRALAX / GLYCOLAX) packet 17 g  17 g Oral Daily PRN Eldred Manges, MD   17 g at 12/10/12 1557  . polyethylene glycol (MIRALAX / GLYCOLAX) packet 17 g  17 g Oral Daily Runell Gess, MD      . simvastatin (ZOCOR) tablet 40 mg  40 mg Oral QHS Eda Paschal [place], [place]   40 mg at 12/10/12 2113  . traMADol (ULTRAM) tablet 50 mg  50 mg Oral Q6H PRN Abelino Derrick, [place]      . Warfarin -  Pharmacist Dosing Inpatient   Does not apply q1800 Benny Lennert, PHARMD      . zolpidem [facility]) tablet 5 mg  5 mg Oral QHS PRN Abelino Derrick, PA        PE: General appearance: alert, cooperative and no distress Lungs: decreased BS. Heart: regular rate and rhythm, S1, S2 normal, no murmur, click, rub or gallop Extremities: Trace tight LEE Pulses:  No palpable right LE pulses, however, I was able to doppler both a DP and PT Skin:  Right PT lesion and heel lesion Neurologic: Grossly intact.    Lab Results:   Basename 12/11/12 0415 12/10/12 0314 12/09/12 0435  WBC 10.9* 11.3* 10.0  HGB 8.3* 8.6* 8.4*  HCT 26.1* 27.0* 27.0*  PLT 181 217 207   BMET  Basename 12/11/12 0415 12/10/12 0314 12/09/12 0435  NA 133* 137 139  K 4.7 4.7 4.9  CL 105 105 109  CO2 20 21 20   GLUCOSE 215* 177* 160*  BUN 29* 28* 31*  CREATININE 1.63* 1.73* 1.73*  CALCIUM 9.1 9.2 8.9   PT/INR  Basename 12/11/12 0415 12/10/12 0314 12/09/12 0435  LABPROT 17.5* 17.1* 16.3*  INR 1.48 1.43 1.34   Cholesterol No results found for this basename: CHOL in the last 72 hours Cardiac Enzymes No components found with this basename: TROPONIN:3, CKMB:3  Studies/Results: @RISRSLT2 @   Assessment/Plan  Principal Problem:  *Diabetic foot ulcer, 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  POD #3.  Left BKA.  BP and HR stable 2.  INR subtherapeutic.  Lovenox to coumadin bridging in process. 3.   Right PT and heel lesions:  PT and DP pulses were dopplered.  Will new close monitoring at CIR.  May need  right LE angiogram at some point.  4. Disposition to CIR vs SNF.  CIR is still a possibility. If approved, he can be moved today.  CSW is working on placement to SNF but may not be necessary.   LOS: 17 days    Keisi Eckford 12/11/2012 10:03 AM

## 2012-12-11 NOTE — PMR Pre-admission (Signed)
PMR Admission Coordinator Pre-Admission Assessment  Patient: [name] is an 78 y.o., male MRN: [id number] DOB: 1935-07-26 Height: 6\' 2"  (188 cm) Weight: 96.4 kg (212 lb 8.4 oz)              Insurance Information HMO:     PPO:      PCP:      IPA:      80/20: yes     OTHER: no HMO PRIMARY: Medicare A and B      Policy#: [id number]      Subscriber: pt Benefits:  Phone #: [place]     Name: 12/10/12 Eff. Date: 09/02/02     Deduct: $1216      Out of Pocket Max: none      Life Max: none CIR: 100%      SNF: 20 full days LBD none Outpatient: 80%     Co-Pay: 20% Home Health: 100%      Co-Pay: none DME: 80%     Co-Pay: 20% Providers: pt choice  SECONDARY: [employer] [place]      Policy#: [id number]      Subscriber: pt No auth required with Medicare primary  Emergency Contact Information Contact Information    Name Relation Home Work Roselawn Daughter [phone]    Rachael Darby [phone]     [employer], [place] [phone]       Current Medical History  Patient Admitting Diagnosis: Left BKA  History of Present Illness: [name] is a 78 y.o. right-handed male with history of diabetes mellitus with peripheral neuropathy, coronary artery disease with CABG, PAF with ICD on chronic Coumadin therapy and chronic systolic congestive heart failure. Patient was in 11/24/2012 with progressive nonhealing ulcers of left lower extremity. Angiography revealed moderate stenosis left SFA and underwent angioplasty. Patient's chronic Coumadin was resumed. 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 Ophelia Charter. Chronic Coumadin has been resumed and plan Lovenox for INR therapeutic. Hospital course anemia 8.6 and monitored. Patient also with stage II pressure ulcer right heel with followup per wound care nurse and Prevalon boot in place. Cardiology on 12/11/12 felt no palpable right LE  pulses, but able to doppler both DP and PT. Plan close monitoring and may need RLE angiogram in future.  Past Medical History  Past Medical History  Diagnosis Date  . HTN (hypertension)   . PVD (peripheral vascular disease)   . COPD (chronic obstructive pulmonary disease)   . Diabetes mellitus     IDDM  . S/P CABG x 6 2003    Dr Cornelius Moras, Myoview low risk 2009  . Gout   . Paroxysmal atrial fibrillation   . Hypothyroidism   . ICD (implantable cardiac defibrillator) discharge 5/11    MDT  . Chronic renal insufficiency, stage III (moderate)   . Chronic systolic CHF (congestive heart failure)     EF 25-30% echo 12/12  . S/P angioplasty, 11/27/12 -Lt below the knee popliteal artery 11/28/2012    Family History  family history is not on file.  Prior Rehab/Hospitalizations: no SNF before per pt  Current Medications  Current facility-administered medications:0.45 % sodium chloride infusion, , Intravenous, Continuous, Eldred Manges, MD, Last Rate: 50 mL/hr at 12/09/12 0301;  acetaminophen (TYLENOL) tablet 650 mg, 650 mg, Oral, Q4H PRN, Abelino Derrick, PA, 650 mg at 12/10/12 2001;  allopurinol (ZYLOPRIM) tablet 100 mg, 100 mg, Oral, Daily, Abelino Derrick,  PA, 100 mg at 12/11/12 1022 carvedilol (COREG) tablet 25 mg, 25 mg, Oral, BID WC, Eda Paschal [place], [place], 25 mg at 12/11/12 1022;  docusate sodium (COLACE) capsule 200 mg, 200 mg, Oral, Daily, Runell Gess, MD, 200 mg at 12/11/12 1022;  enoxaparin (LOVENOX) injection 95 mg, 95 mg, Subcutaneous, Q12H, Dannielle Karvonen [place], PHARMD, 95 mg at 12/11/12 1610;  feeding supplement (GLUCERNA SHAKE) liquid 237 mL, 237 mL, Oral, BID BM, Ailene Ards, RD, 237 mL at 12/10/12 1448 feeding supplement (PRO-STAT SUGAR FREE 64) liquid 30 mL, 30 mL, Oral, BID WC, Ailene Ards, RD, 30 mL at 12/10/12 1704;  furosemide (LASIX) tablet 80 mg, 80 mg, Oral, Daily, Nada Boozer, NP, 80 mg at 12/11/12 1022;  hydrALAZINE (APRESOLINE) tablet 25 mg, 25 mg, Oral,  TID, Abelino Derrick, PA, 25 mg at 12/11/12 1021;  HYDROmorphone (DILAUDID) injection 0.5-1 mg, 0.5-1 mg, Intravenous, Q2H PRN, Eldred Manges, MD, 1 mg at 12/10/12 9604 insulin aspart (novoLOG) injection 0-15 Units, 0-15 Units, Subcutaneous, TID WC, Eda Paschal [place], [place], 3 Units at 12/11/12 [phone];  insulin aspart (novoLOG) injection 0-5 Units, 0-5 Units, Subcutaneous, QHS, Eda Paschal [place], [place], 2 Units at 11/30/12 2203;  insulin glargine (LANTUS) injection 12 Units, 12 Units, Subcutaneous, BID, Marykay Lex, MD, 12 Units at 12/11/12 1041 isosorbide dinitrate (ISORDIL) tablet 20 mg, 20 mg, Oral, TID, Abelino Derrick, PA, 20 mg at 12/11/12 1021;  levothyroxine (SYNTHROID, LEVOTHROID) tablet 50 mcg, 50 mcg, Oral, QAC breakfast, Eda Paschal [place], [place], 50 mcg at 12/11/12 8119;  meperidine (DEMEROL) injection 6.25-12.5 mg, 6.25-12.5 mg, Intravenous, Q5 min PRN, E. Jairo Ben, MD;  methocarbamol (ROBAXIN) tablet 500 mg, 500 mg, Oral, Q6H PRN, Eldred Manges, MD, 500 mg at 12/10/12 2113 metoCLOPramide (REGLAN) tablet 5-10 mg, 5-10 mg, Oral, Q8H PRN, Eldred Manges, MD;  morphine 2 MG/ML injection 2 mg, 2 mg, Intravenous, Q1H PRN, Runell Gess, MD, 2 mg at 11/27/12 2045;  ondansetron [facility]) injection 4 mg, 4 mg, Intravenous, Q6H PRN, Abelino Derrick, PA, 4 mg at 11/27/12 2045;  ondansetron (ZOFRAN) injection 4 mg, 4 mg, Intravenous, Q6H PRN, Eldred Manges, MD;  ondansetron [facility]) tablet 4 mg, 4 mg, Oral, Q6H PRN, Eldred Manges, MD oxyCODONE-acetaminophen (PERCOCET/ROXICET) 5-325 MG per tablet 1-2 tablet, 1-2 tablet, Oral, Q4H PRN, Eldred Manges, MD, 2 tablet at 12/11/12 1021;  pantoprazole (PROTONIX) EC tablet 40 mg, 40 mg, Oral, Daily, [employer], [place], 40 mg at 12/11/12 1023;  polyethylene glycol (MIRALAX / GLYCOLAX) packet 17 g, 17 g, Oral, Daily PRN, Eldred Manges, MD, 17 g at 12/10/12 1557 polyethylene glycol (MIRALAX / GLYCOLAX) packet 17 g, 17 g, Oral, Daily, Runell Gess, MD, 17 g at 12/11/12 1024;  simvastatin  (ZOCOR) tablet 40 mg, 40 mg, Oral, QHS, Luke K Heleman[place], [place], 40 mg at 12/10/12 2113;  traMADol (ULTRAM) tablet 50 mg, 50 mg, Oral, Q6H PRN, Abelino Derrick, PA;  Warfarin - Pharmacist Dosing Inpatient, , Does not apply, q1800, Benny Lennert, PHARMD;  zolpidem (AMBIEN) tablet 5 mg, 5 mg, Oral, QHS PRN, Abelino Derrick, PA  Patients Current Diet: Carb Control  Precautions / Restrictions Precautions Precautions: Fall Precaution Comments: +2 assist required for safety with transfers/walking Restrictions Weight Bearing Restrictions: No   Prior Activity Level Household: limited due to vascular issues  Home Assistive Devices / Equipment Home Assistive Devices/Equipment: Cane (specify quad or straight) Home Adaptive Equipment: Quad cane;Walker - rolling  Prior  Functional Level Prior Function Level of Independence: Independent with assistive device(s) Able to Take Stairs?: Yes Driving: Yes Vocation: Retired  Current Functional Level Cognition  Arousal/Alertness: Awake/alert Overall Cognitive Status: Appears within functional limits for tasks assessed/performed Orientation Level: Oriented X4    Extremity Assessment (includes Sensation/Coordination)  RUE ROM/Strength/Tone: Within functional levels RUE Sensation: WFL - Light Touch RUE Coordination: Deficits RUE Coordination Deficits: Pt with only 25% of full AROM digit flexion secondary to arthritis.  Pt uses lateral and tip to tip pinch for FM tasks.  RLE ROM/Strength/Tone: Deficits RLE ROM/Strength/Tone Deficits: functional weakness observed, assist required to stand however at least 3+/5 throughout    ADLs  Eating/Feeding: Simulated;Modified independent Where Assessed - Eating/Feeding: Chair Grooming: Simulated;Set up Where Assessed - Grooming: Unsupported sitting Upper Body Bathing: Simulated;Set up Where Assessed - Upper Body Bathing: Unsupported sitting Lower Body Bathing: Simulated;+2 Total assistance Lower Body Bathing:  Patient Percentage: 40% Where Assessed - Lower Body Bathing: Supported sit to stand Upper Body Dressing: Simulated;Set up Where Assessed - Upper Body Dressing: Unsupported sitting Lower Body Dressing: Simulated;+2 Total assistance Lower Body Dressing: Patient Percentage: 40% Where Assessed - Lower Body Dressing: Supported sit to stand Toilet Transfer: Simulated;+2 Total assistance Toilet Transfer: Patient Percentage: 40% Statistician Method: Surveyor, minerals: Other (comment) (to bedside chair, pt declined need to toilet) Toileting - Clothing Manipulation and Hygiene: Simulated;+2 Total assistance Toileting - Clothing Manipulation and Hygiene: Patient Percentage: 40% Where Assessed - Toileting Clothing Manipulation and Hygiene: Sit to stand from 3-in-1 or toilet Tub/Shower Transfer Method: Not assessed Equipment Used: Rolling walker;Gait belt Transfers/Ambulation Related to ADLs: Pt able to perform stand piovt transfers and short distance ambulation with total assist +2 pt (40%) ADL Comments: Pt overall needs total assist +2 for sit to stand during LB selfcare.  Reports not wearing socks previous to this secondary to not being able to reach down and get them on.  Has a LH shoe horn that he used for donning his shoes.  Will benefit from more AE education.    Mobility  Bed Mobility: Supine to Sit Supine to Sit: HOB elevated;With rails;4: Min assist Sitting - Scoot to Edge of Bed: 7: Independent Sit to Supine: 7: Independent    Transfers  Transfers: Stand to Sit;Sit to Stand Sit to Stand: 1: +2 Total assist;From bed;With armrests;From chair/3-in-1 Sit to Stand: Patient Percentage: 50% Stand to Sit: To chair/3-in-1;With upper extremity assist;1: +2 Total assist Stand to Sit: Patient Percentage: 60%    Ambulation / Gait / Stairs / Wheelchair Mobility  Ambulation/Gait Ambulation/Gait Assistance: 1: +2 Total assist Ambulation/Gait: Patient Percentage:  50% Ambulation Distance (Feet): 10 Feet (5' x 2 with seated rest break) Assistive device: Rolling walker Ambulation/Gait Assistance Details: increased assist for R knee buckling at times, verbal cues for sequence, pt unable to lift R foot from floor so more of a glide than hop, verbal cues to use upper body through RW to assist Gait Pattern: Step-to pattern Gait velocity: decreased General Gait Details: increased effort, R knee flexed, +2 for balance and to maintain safe distance from RW Stairs: No Wheelchair Mobility Wheelchair Mobility: No    Posture / Balance Static Standing Balance Static Standing - Balance Support: Bilateral upper extremity supported Static Standing - Level of Assistance: 1: +2 Total assist;Other (comment) (60% with verbal cues for posture, UE assist through RW)    Special needs/care consideration BiPAP/CPAP unable to tolerate CPM no Continuous Drip IV no Dialysis no  Days no Life Vest no Oxygen no Special Bed no Trach Size no Wound Vac (area) no      Skin    1/8 WOC        Wound type: Stage II pressure ulcer right heel  Pressure Ulcer POA: No  Measurement: unchanged  Wound MVH:QIONGE filled blister, intact  Drainage (amount, consistency, odor) none  Periwound:intact  Dressing procedure/placement/frequency: continue soft silicone foam dressing to protect from rupture, and Prevalon boot for offloading.  Area of the right medial pretibial area is still erythematous and indurated, now has 1.0cm x 1.0cm area that appears to be consistent with new ulceration formation. With history of DM and PVD, concerned this new area will worsening. Requested vascular evaluation of this extremity, I can not palpate a pulse well. The extremity is edematous as well.  Will notify orthopedic MD of above and request his opinion.  Melody Eliberto Ivory RN,CWOCN  [phone]    Bowel mgmt: BM 3 days ago 1/6 Bladder mgmt:urinal Diabetic mgmt yes   Previous Home Environment Pt's home one  level with 9 step entry but 9 step entry to front yard from street level. No way to provide ramped entrance from street or to front door due to height.  Discharge Living Setting Plans for Discharge Living Setting: Lives with (comment) (Plans to move in with Selena Batten and Herbert Seta next door. ) Type of Home at Discharge: House Discharge Home Layout: One level Discharge Home Access: Stairs to enter Entrance Stairs-Number of Steps: 3 to 4 steps Discharge Bathroom Shower/Tub: Tub/shower unit Discharge Bathroom Toilet: Standard Discharge Bathroom Accessibility: Yes How Accessible: Accessible via walker Do you have any problems obtaining your medications?: No  Social/Family/Support Systems Patient Roles: Parent Contact Information: Turner Daniels dtr, and granddtr Silvestre Moment Anticipated Caregiver: dtr and granddaughter intermiitent assistance Anticipated Caregiver's Contact Information: home [phone]; work [phone] [employer] Ability/Limitations of Caregiver: work at [employer] day end evening shifts. Otherwise min assist Caregiver Availability: Intermittent Discharge Plan Discussed with Primary Caregiver: Yes (I spoke with Selena Batten and Herbert Seta and pt) Is Caregiver In Agreement with Plan?: Yes Does Caregiver/Family have Issues with Lodging/Transportation while Pt is in Rehab?: No    Goals/Additional Needs Patient/Family Goal for Rehab: Mod I to supervision with PT and OT at w/c level Expected length of stay: ELOS 10 to 14 days Equipment Needs: ramp to be built 3 to 4 step entry into Kim's home Special Service Needs: RLE vascular assessment ongoing may need angiogram Additional Information: Patient owns his and Kim;s homes. He and she are aware that unrealistic to get ramp into his home. He has suggested they switch homes, but she has remodeled hers. You can drive up [address] to her home Pt/Family Agrees to Admission and willing to participate: Yes Program Orientation Provided &  Reviewed with Pt/Caregiver Including Roles  & Responsibilities: Yes Additional Information Needs: Patient , Tomasa Blase prefer CIR.   Decrease burden of Care through IP rehab admission: n/a   Possible need for SNF placement upon discharge:I discussed with pt, Selena Batten, and Heather the anticipated LOS of 2 weeks. Hopeful to d/c home at level to be w/c level in Kim's home. On 1/9 discussed need for ramp entry into her home to be initiated immediately. If he fails to progress to w/c level close to Mod I that SNF after CIR would be needed.   Patient Condition: This patient's condition remains as documented in the Consult dated 12/11/12, in which the Rehabilitation Physician determined and documented that the patient's condition  is appropriate for intensive rehabilitative care in an inpatient rehabilitation facility.  Preadmission Screen Completed By:  Clois Dupes, 12/11/2012 12:14 PM ______________________________________________________________________   Discussed status with Dr. Wynn Banker on 12/11/12 at  1214 and received telephone approval for admission today.  Admission Coordinator:  Clois Dupes, time 1610 Date 12/11/12.

## 2012-12-11 NOTE — Progress Notes (Signed)
Occupational Therapy Treatment Patient Details Name: [name] MRN: [id number] DOB: 1935/01/26 Today's Date: 12/11/2012 Time: 7253-6644 OT Time Calculation (min): 29 min  OT Assessment / Plan / Recommendation Comments on Treatment Session Pt progressing with functional transfers but continues to require +2 assist. Aniticipate d/c to CIR later today.    Follow Up Recommendations  SNF;CIR    Barriers to Discharge       Equipment Recommendations  None recommended by OT    Recommendations for Other Services    Frequency Min 2X/week   Plan Discharge plan remains appropriate    Precautions / Restrictions Precautions Precautions: Fall Precaution Comments: +2 assist required for safety with transfers/walking   Pertinent Vitals/Pain      ADL  Grooming: Performed;Brushing hair;Set up Where Assessed - Grooming: Unsupported sitting Upper Body Dressing: Performed;Set up Where Assessed - Upper Body Dressing: Unsupported sitting Toilet Transfer: Simulated;+2 Total assistance Toilet Transfer: Patient Percentage: 50% Toilet Transfer Method: Sit to stand Toilet Transfer Equipment:  (bed to ambulate then to chair) Equipment Used: Gait belt;Rolling walker Transfers/Ambulation Related to ADLs: +2 total assist (pt= 50%) ambulation with RW.  Pt with R knee buckling and easily fatigued.  ADL Comments: Quickly fatigued during functional mobility.    OT Diagnosis:    OT Problem List:   OT Treatment Interventions:     OT Goals ADL Goals Pt Will Perform Grooming: with min assist;Supported;Standing at sink;Other (comment) ADL Goal: Grooming - Progress: Progressing toward goals Pt Will Transfer to Toilet: with min assist;with DME;3-in-1 ADL Goal: Toilet Transfer - Progress: Progressing toward goals  Visit Information  Last OT Received On: 12/11/12 Assistance Needed: +2 PT/OT Co-Evaluation/Treatment: Yes    Subjective Data      Prior Functioning  Home Living Additional Comments:  from street level there are 9 steep steps to get into front yard and then 9 steep step entry into home    Cognition  Overall Cognitive Status: Appears within functional limits for tasks assessed/performed Arousal/Alertness: Awake/alert Orientation Level: Appears intact for tasks assessed Behavior During Session: [facility] for tasks performed    Mobility  Shoulder Instructions Bed Mobility Bed Mobility: Supine to Sit Supine to Sit: 4: Min assist;HOB elevated;With rails Sitting - Scoot to Edge of Bed: 5: Supervision Details for Bed Mobility Assistance: min A for elevating trunk, increased effort and time Transfers Transfers: Sit to Stand;Stand to Sit Sit to Stand: 1: +2 Total assist;From bed;With upper extremity assist Sit to Stand: Patient Percentage: 50% Stand to Sit: To chair/3-in-1;With upper extremity assist;1: +2 Total assist Stand to Sit: Patient Percentage: 60% Details for Transfer Assistance: verbal cues for safe technique, assist to rise and steady as well as control descent       Exercises     Balance Balance Balance Assessed: Yes Static Standing Balance Static Standing - Balance Support: Bilateral upper extremity supported Static Standing - Level of Assistance: 1: +2 Total assist;Other (comment) (60% with verbal cues for posture, UE assist through RW)   End of Session OT - End of Session Equipment Utilized During Treatment: Gait belt Activity Tolerance: Patient limited by fatigue Patient left: in chair;with call bell/phone within reach Nurse Communication: Mobility status  GO   12/11/2012 Cipriano Mile OTR/L Pager [phone] Office [phone]   Cipriano Mile 12/11/2012, 3:46 PM

## 2012-12-11 NOTE — H&P (Signed)
No chief complaint on file.  :  HPI: [name] is a 78 y.o. right-handed male with history of diabetes mellitus with peripheral neuropathy, coronary artery disease with CABG, PAF with ICD on chronic Coumadin therapy and chronic systolic congestive heart failure. Patient was in the 11/24/2012 with progressive nonhealing ulcers of left lower extremity. Angiography revealed moderate stenosis left SFA and underwent angioplasty. Patient's chronic Coumadin was resumed. 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 Ophelia Charter. Chronic Coumadin has been resumed and plan Lovenox for INR therapeutic. Hospital course anemia 8.3 and monitored. Patient also with stage II pressure ulcer right heel with followup per wound care nurse and Prevalon boot in place. Physical and occupational therapy evaluations completed an ongoing. Physical therapy is recommended physical medicine rehabilitation consult to consider inpatient rehabilitation services. Patient was felt to be a date candidate for inpatient rehabilitation services and was admitted for a comprehensive rehabilitation program   Has pain in the left stump as well as occasional phantom limb sensation Review of Systems  Cardiovascular: Positive for palpitations and leg swelling.  Musculoskeletal: Positive for joint pain.  Neurological: Positive for weakness.  All other systems reviewed and are negative  Past Medical History   Diagnosis  Date   .  HTN (hypertension)    .  PVD (peripheral vascular disease)    .  COPD (chronic obstructive pulmonary disease)    .  Diabetes mellitus      IDDM   .  S/P CABG x 6  2003     Dr Cornelius Moras, Myoview low risk 2009   .  Gout    .  Paroxysmal atrial fibrillation    .  Hypothyroidism    .  ICD (implantable cardiac defibrillator) discharge  5/11     MDT   .  Chronic renal insufficiency, stage III (moderate)    .  Chronic systolic CHF (congestive heart  failure)      EF 25-30% echo 12/12   .  S/P angioplasty, 11/27/12 -Lt below the knee popliteal artery  11/28/2012    Past Surgical History   Procedure  Date   .  Coronary artery bypass graft    .  Cardiac catheterization    .  Amputation  12/08/2012     Procedure: AMPUTATION BELOW KNEE; Surgeon: Eldred Manges, MD; Location: [facility] OR; Service: Orthopedics; Laterality: Left;    History reviewed. No pertinent family history.  Social History: reports that he quit smoking about 9 years ago. He has quit using smokeless tobacco. He reports that he does not drink alcohol or use illicit drugs.  Allergies:  Allergies   Allergen  Reactions   .  Iodine Solution (Povidone Iodine)  Rash     Topical only    Medications Prior to Admission   Medication  Sig  Dispense  Refill   .  allopurinol (ZYLOPRIM) 100 MG tablet  Take 1 tablet (100 mg total) by mouth daily.  30 tablet  5   .  carvedilol (COREG) 25 MG tablet  Take 1 tablet (25 mg total) by mouth 2 (two) times daily with a meal.  60 tablet  5   .  furosemide (LASIX) 80 MG tablet  Take 80 mg by mouth daily.     .  hydrALAZINE (APRESOLINE) 25 MG tablet  Take 25 mg by mouth 3 (three) times daily.     .  insulin aspart (NOVOLOG) 100  UNIT/ML injection  Inject 10-15 Units into the skin 3 (three) times daily before meals. Per sliding scale     .  insulin glargine (LANTUS) 100 UNIT/ML injection  Inject 10 Units into the skin 2 (two) times daily.     .  isosorbide dinitrate (ISORDIL) 20 MG tablet  Take 20 mg by mouth 3 (three) times daily.     Marland Kitchen  levothyroxine (SYNTHROID, LEVOTHROID) 50 MCG tablet  Take 50 mcg by mouth every morning.     .  metolazone (ZAROXOLYN) 2.5 MG tablet  Take 2.5 mg by mouth daily. States he takes with his furosemide every day except on Tuesday and Friday     .  omeprazole (PRILOSEC) 20 MG capsule  Take 40 mg by mouth every morning.     .  potassium chloride SA (K-DUR,KLOR-CON) 20 MEQ tablet  Take 20 mEq by mouth 2 (two) times daily.     .   simvastatin (ZOCOR) 40 MG tablet  Take 40 mg by mouth at bedtime.     Marland Kitchen  warfarin (COUMADIN) 5 MG tablet  Take 2.5-5 mg by mouth every evening. Takes one tablet (5mg ) every day except on Tuesdays and Fridays, take one-half tablet (2.5mg )      Home:  Home Living  Lives With: Alone  Available Help at Discharge: Family;Available PRN/intermittently (daughter and grand daughter work days)  Type of Home: House  Home Access: Stairs to enter  Secretary/administrator of Steps: 9  Entrance Stairs-Rails: Can reach both  Home Layout: One level  Bathroom Shower/Tub: Tub/shower unit;Curtain  Firefighter: Handicapped height  Home Adaptive Equipment: Therapist, sports - rolling  Functional History:  Prior Function  Able to Take Stairs?: Yes  Driving: Yes  Vocation: Retired  Functional Status:  Mobility:  Bed Mobility  Bed Mobility: Supine to Sit  Supine to Sit: 5: Supervision;HOB elevated;With rails  Sitting - Scoot to Edge of Bed: 7: Independent  Sit to Supine: 7: Independent  Transfers  Transfers: Stand to Sit;Sit to Stand  Sit to Stand: 1: +2 Total assist;From bed  Sit to Stand: Patient Percentage: 40%  Stand to Sit: 3: Mod assist;To chair/3-in-1;With upper extremity assist  Ambulation/Gait  Ambulation/Gait Assistance: 1: +2 Total assist  Ambulation/Gait: Patient Percentage: 40%  Ambulation Distance (Feet): 15 Feet  Assistive device: Rolling walker  Ambulation/Gait Assistance Details: increased assist for R knee buckling at times, verbal cues for sequence, pt unable to lift R foot from floor so more of a glide than hop, verbal cues to use upper body through RW to assist  Gait Pattern: Step-to pattern  Gait velocity: decreased  General Gait Details: increased effort  Stairs: No  Wheelchair Mobility  Wheelchair Mobility: No  ADL:  ADL  Eating/Feeding: Simulated;Modified independent  Where Assessed - Eating/Feeding: Chair  Grooming: Simulated;Set up  Where Assessed - Grooming:  Unsupported sitting  Upper Body Bathing: Simulated;Set up  Where Assessed - Upper Body Bathing: Unsupported sitting  Lower Body Bathing: Simulated;+2 Total assistance  Where Assessed - Lower Body Bathing: Supported sit to stand  Upper Body Dressing: Simulated;Set up  Where Assessed - Upper Body Dressing: Unsupported sitting  Lower Body Dressing: Simulated;+2 Total assistance  Where Assessed - Lower Body Dressing: Supported sit to stand  Toilet Transfer: Simulated;+2 Total assistance  Toilet Transfer Method: Archivist: Other (comment) (to bedside chair, pt declined need to toilet)  Tub/Shower Transfer Method: Not assessed  Equipment Used: Rolling walker;Gait belt  Transfers/Ambulation Related to ADLs:  Pt able to perform stand piovt transfers and short distance ambulation with total assist +2 pt (40%)  ADL Comments: Pt overall needs total assist +2 for sit to stand during LB selfcare. Reports not wearing socks previous to this secondary to not being able to reach down and get them on. Has a LH shoe horn that he used for donning his shoes. Will benefit from more AE education.  Cognition:  Cognition  Arousal/Alertness: Awake/alert  Orientation Level: Oriented X4  Cognition  Overall Cognitive Status: Appears within functional limits for tasks assessed/performed  Arousal/Alertness: Awake/alert  Orientation Level: Appears intact for tasks assessed  Behavior During Session: [facility] for tasks performed  Blood pressure 121/84, pulse 72, temperature 97.9 F (36.6 C), temperature source Oral, resp. rate 18, height 6\' 2"  (1.88 m), weight 96.4 kg (212 lb 8.4 oz), SpO2 97.00%.  Physical Exam  Vitals reviewed.  Constitutional: He is oriented to person, place, and time.  HENT:  Head: Normocephalic and atraumatic.  Speech very coarse, muffled. Difficult to understand.  Eyes: Conjunctivae normal are normal. Pupils are equal, round, and reactive to light.  Pupils round and  reactive to light  Neck: Neck supple. No thyromegaly present.  Cardiovascular: Normal rate and regular rhythm.  Cardiac rate controlled  Pulmonary/Chest: Breath sounds normal. He has no wheezes.  Abdominal: Bowel sounds are normal. He exhibits distension. There is no tenderness.  Neurological: He is alert and oriented to person, place, and time. He displays normal reflexes. No cranial nerve deficit. Coordination normal.  Follow simple commands. Diminished LT distally RIght leg. Strength 4+ in the uppers. RLE 3/5. LLE was not tested due to pain  Skin:  Left BKA  With good shrinkage, Xeroform gauze over the incision, dried blood sticking to gauze dressing.. Right heel with a boot . Wounds on the medial lower leg/ankle and heel. Chronic vascular changes noted  Psychiatric: He has a normal mood and affect. His behavior is normal. Judgment and thought content normal  Results for orders placed during the hospital encounter of 11/24/12 (from the past 48 hour(s))   GLUCOSE, CAPILLARY Status: Abnormal    Collection Time    12/09/12 11:26 AM   Component  Value  Range  Comment    Glucose-Capillary  160 (*)  70 - 99 mg/dL     Comment 1  Notify RN     GLUCOSE, CAPILLARY Status: Abnormal    Collection Time    12/09/12 4:20 PM   Component  Value  Range  Comment    Glucose-Capillary  175 (*)  70 - 99 mg/dL     Comment 1  Documented in Chart      Comment 2  Notify RN     GLUCOSE, CAPILLARY Status: Abnormal    Collection Time    12/09/12 9:02 PM   Component  Value  Range  Comment    Glucose-Capillary  169 (*)  70 - 99 mg/dL     Comment 1  Documented in Chart      Comment 2  Notify RN     CBC Status: Abnormal    Collection Time    12/10/12 3:14 AM   Component  Value  Range  Comment    WBC  11.3 (*)  4.0 - 10.5 K/uL     RBC  3.07 (*)  4.22 - 5.81 MIL/uL     Hemoglobin  8.6 (*)  13.0 - 17.0 g/dL     HCT  16.1 (*)  09.6 - 52.0 %  MCV  87.9  78.0 - 100.0 fL     MCH  28.0  26.0 - 34.0 pg     MCHC  31.9   30.0 - 36.0 g/dL     RDW  52.8 (*)  41.3 - 15.5 %     Platelets  217  150 - 400 K/uL    PROTIME-INR Status: Abnormal    Collection Time    12/10/12 3:14 AM   Component  Value  Range  Comment    Prothrombin Time  17.1 (*)  11.6 - 15.2 seconds     INR  1.43  0.00 - 1.49    BASIC METABOLIC PANEL Status: Abnormal    Collection Time    12/10/12 3:14 AM   Component  Value  Range  Comment    Sodium  137  135 - 145 mEq/L     Potassium  4.7  3.5 - 5.1 mEq/L     Chloride  105  96 - 112 mEq/L     CO2  21  19 - 32 mEq/L     Glucose, Bld  177 (*)  70 - 99 mg/dL     BUN  28 (*)  6 - 23 mg/dL     Creatinine, Ser  2.44 (*)  0.50 - 1.35 mg/dL     Calcium  9.2  8.4 - 10.5 mg/dL     GFR calc non Af Amer  37 (*)  >90 mL/min     GFR calc Af Amer  43 (*)  >90 mL/min    HEPARIN LEVEL (UNFRACTIONATED) Status: Abnormal    Collection Time    12/10/12 3:14 AM   Component  Value  Range  Comment    Heparin Unfractionated  0.17 (*)  0.30 - 0.70 IU/mL    GLUCOSE, CAPILLARY Status: Abnormal    Collection Time    12/10/12 6:21 AM   Component  Value  Range  Comment    Glucose-Capillary  176 (*)  70 - 99 mg/dL    GLUCOSE, CAPILLARY Status: Abnormal    Collection Time    12/10/12 11:18 AM   Component  Value  Range  Comment    Glucose-Capillary  149 (*)  70 - 99 mg/dL     Comment 1  Notify RN     GLUCOSE, CAPILLARY Status: Abnormal    Collection Time    12/10/12 4:37 PM   Component  Value  Range  Comment    Glucose-Capillary  157 (*)  70 - 99 mg/dL     Comment 1  Documented in Chart      Comment 2  Notify RN     GLUCOSE, CAPILLARY Status: Abnormal    Collection Time    12/10/12 9:33 PM   Component  Value  Range  Comment    Glucose-Capillary  199 (*)  70 - 99 mg/dL     Comment 1  Documented in Chart      Comment 2  Notify RN     CBC Status: Abnormal    Collection Time    12/11/12 4:15 AM   Component  Value  Range  Comment    WBC  10.9 (*)  4.0 - 10.5 K/uL     RBC  2.98 (*)  4.22 - 5.81 MIL/uL     Hemoglobin   8.3 (*)  13.0 - 17.0 g/dL     HCT  01.0 (*)  27.2 - 52.0 %     MCV  87.6  78.0 - 100.0 fL     MCH  27.9  26.0 - 34.0 pg     MCHC  31.8  30.0 - 36.0 g/dL     RDW  16.1 (*)  09.6 - 15.5 %     Platelets  181  150 - 400 K/uL    PROTIME-INR Status: Abnormal    Collection Time    12/11/12 4:15 AM   Component  Value  Range  Comment    Prothrombin Time  17.5 (*)  11.6 - 15.2 seconds     INR  1.48  0.00 - 1.49    BASIC METABOLIC PANEL Status: Abnormal    Collection Time    12/11/12 4:15 AM   Component  Value  Range  Comment    Sodium  133 (*)  135 - 145 mEq/L     Potassium  4.7  3.5 - 5.1 mEq/L     Chloride  105  96 - 112 mEq/L     CO2  20  19 - 32 mEq/L     Glucose, Bld  215 (*)  70 - 99 mg/dL     BUN  29 (*)  6 - 23 mg/dL     Creatinine, Ser  0.45 (*)  0.50 - 1.35 mg/dL     Calcium  9.1  8.4 - 10.5 mg/dL     GFR calc non Af Amer  40 (*)  >90 mL/min     GFR calc Af Amer  46 (*)  >90 mL/min    GLUCOSE, CAPILLARY Status: Abnormal    Collection Time    12/11/12 6:20 AM   Component  Value  Range  Comment    Glucose-Capillary  204 (*)  70 - 99 mg/dL     No results found.  Post Admission Physician Evaluation:  1. Functional deficits secondary to Left below-knee amputation postoperative day #3 in a patient with severe diabetic neuropathy. 2. Patient is admitted to receive collaborative, interdisciplinary care between the physiatrist, rehab nursing staff, and therapy team. 3. Patient's level of medical complexity and substantial therapy needs in context of that medical necessity cannot be provided at a lesser intensity of care such as a SNF. 4. Patient has experienced substantial functional loss from his/her baseline which was documented above under the "Functional History" and "Functional Status" headings. Judging by the patient's diagnosis, physical exam, and functional history, the patient has potential for functional progress which will result in measurable gains while on inpatient rehab. These  gains will be of substantial and practical use upon discharge in facilitating mobility and self-care at the household level. 5. Physiatrist will provide 24 hour management of medical needs as well as oversight of the therapy plan/treatment and provide guidance as appropriate regarding the interaction of the two. 6. 24 hour rehab nursing will assist with bladder management, bowel management, safety, skin/wound care, disease management, medication administration, pain management and patient education and help integrate therapy concepts, techniques,education, etc. 7. PT will assess and treat for: Pre-gait training, gait training, endurance, safety, equipment. Goals are: Modified independent level wheelchair supervision with walker. 8. OT will assess and treat for: ADLs, cognitive perceptual skills, safety, endurance, equipment. Goals are: Modified independent level with upper body ADLs supervision lower body ADLs. 9. SLP will assess and treat for: Not applicable. Goals are: Not applicable. 10. Case Management and Social Worker will assess and treat for psychological issues and discharge planning. 11. Team conference will be held weekly to assess progress toward goals and  to determine barriers to discharge. 12. Patient will receive at least 3 hours of therapy per day at least 5 days per week. 13. ELOS: 7-10 days Prognosis: good Medical Problem List and Plan:  1. 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 DVT Prophylaxis/Anticoagulation: Chronic Coumadin therapy for history of PAF/ICD. Continue Lovenox for INR greater than 2.00. Monitor for a bleeding episodes.  3. Pain Management: Percocet, Robaxin as needed. Monitor with increased mobility  4. Neuropsych: This patient Is capable of making decisions on his/her own behalf.  5. Postoperative anemia. Latest hemoglobin 8.3. Followup CBC. Patient currently asymptomatic  6. Stage II pressure ulcer right heel. Continue Prevalon boot.  Followup per wound care nurse  7. Hypertension. Isordil 20 mg 3 times a day, hydralazine 25 mg 3 times a day, Lasix 80 mg daily, coreg 25 mg twice a day. Monitor with increased mobility  8. Chronic systolic congestive heart failure. Continue Lasix 80 mg daily and monitor for any signs of fluid overload  9. Diabetes mellitus with peripheral neuropathy. Latest hemoglobin A1c 8.4. Lantus insulin 12 units twice a day. Patient was taking 10 units Lantus insulin twice daily prior to admission. Check blood sugars a.c. and at bedtime  10. Hypothyroidism. Synthroid  11. Hyperlipidemia. Zocor  12. History of gout. Allopurinol. Monitor for any signs of flare up  13. Chronic renal insufficiency. Baseline creatinine 2.33. Followup chemistries  14. Coronary artery disease with CABG/ICD. No chest pain or shortness of breath. Followup cardiology services as needed  12/11/2012

## 2012-12-11 NOTE — Progress Notes (Signed)
I met with patient at bedside and then contacted his daughter, Monte Fantasia, and granddaughter, Silvestre Moment, by phone at work. I discussed inpt rehab venue vs SNF rehab options. Patient and family prefer inpt rehab here. I discussed barriers to entrance to pt's home vs his daughter's home next door. Kim plans for pt to move in with her where ramp can be built to entry into their home. We will plan admit to inpt rehab today. I discussed with Wilburt Finlay, PA, RN CM, SW, and pt's bedside RN. [phone]

## 2012-12-11 NOTE — Progress Notes (Signed)
Clinical Social Work  CSW reviewed chart which stated patient to admit to CIR. CSW is signing off but available if further needs arise.  [place], [place] [phone]

## 2012-12-11 NOTE — Progress Notes (Signed)
Subjective: 3 Days Post-Op Procedure(s) (LRB): AMPUTATION BELOW KNEE (Left) Patient reports pain as mild.  Left leg pain improving. Spoke with WOC RN yesterday about right leg skin changes.  Pt with pressure ulcer to right heel and also area of erythema and early breakdown pretibial.  Unable to find any vascular studies of the right leg in EPIC. Further studies would be helpful if not already done.  Pt reports he wishes to go to CIR for continued rehab however he is not a candidate because of insurance.  Will require NHP. Spoke with admitting team PA and he will check on previous studies for right leg. Currently the pretibial area does not look infected and continues to be superficial. Bigger concern is the healing capability of the heel ulcer.  Objective: Vital signs in last 24 hours: Temp:  [97.9 F (36.6 C)-100.8 F (38.2 C)] 97.9 F (36.6 C) (01/09 0618) Pulse Rate:  [52-72] 72  (01/08 1949) Resp:  [17-18] 18  (01/08 1949) BP: (120-150)/(56-84) 121/84 mmHg (01/09 0618) SpO2:  [95 %-97 %] 97 % (01/09 0618)  Intake/Output from previous day: 01/08 0701 - 01/09 0700 In: 480 [P.O.:480] Out: 900 [Urine:900] Intake/Output this shift: Total I/O In: 240 [P.O.:240] Out: 300 [Urine:300]   Basename 12/11/12 0415 12/10/12 0314 12/09/12 0435  HGB 8.3* 8.6* 8.4*    Basename 12/11/12 0415 12/10/12 0314  WBC 10.9* 11.3*  RBC 2.98* 3.07*  HCT 26.1* 27.0*  PLT 181 217    Basename 12/11/12 0415 12/10/12 0314  NA 133* 137  K 4.7 4.7  CL 105 105  CO2 20 21  BUN 29* 28*  CREATININE 1.63* 1.73*  GLUCOSE 215* 177*  CALCIUM 9.1 9.2    Basename 12/11/12 0415 12/10/12 0314  LABPT -- --  INR 1.48 1.43    dressing of left BKA intact.  Plan for change at one week post op however if not going to rehab and will be leaving hospital will change before discharge.  Assessment/Plan: 3 Days Post-Op Procedure(s) (LRB): AMPUTATION BELOW KNEE (Left) Up with therapy Discharge to SNF when bed  available and stable medically.  Left leg and heel will need to be monitored closely  Porschia Willbanks M 12/11/2012, 10:09 AM

## 2012-12-12 ENCOUNTER — Inpatient Hospital Stay (HOSPITAL_COMMUNITY): Payer: MEDICARE

## 2012-12-12 ENCOUNTER — Inpatient Hospital Stay (HOSPITAL_COMMUNITY): Payer: MEDICARE | Admitting: Occupational Therapy

## 2012-12-12 DIAGNOSIS — G8110 Spastic hemiplegia affecting unspecified side: Secondary | ICD-10-CM

## 2012-12-12 DIAGNOSIS — I4891 Unspecified atrial fibrillation: Secondary | ICD-10-CM

## 2012-12-12 DIAGNOSIS — I6340 Cerebral infarction due to embolism of unspecified cerebral artery: Secondary | ICD-10-CM

## 2012-12-12 LAB — GLUCOSE, CAPILLARY
Glucose-Capillary: 141 mg/dL — ABNORMAL HIGH (ref 70–99)
Glucose-Capillary: 147 mg/dL — ABNORMAL HIGH (ref 70–99)

## 2012-12-12 LAB — CBC WITH DIFFERENTIAL/PLATELET
Basophils Absolute: 0 10*3/uL (ref 0.0–0.1)
Basophils Relative: 0 % (ref 0–1)
Hemoglobin: 8 g/dL — ABNORMAL LOW (ref 13.0–17.0)
MCHC: 31.5 g/dL (ref 30.0–36.0)
Monocytes Relative: 9 % (ref 3–12)
Neutro Abs: 6.1 10*3/uL (ref 1.7–7.7)
Neutrophils Relative %: 72 % (ref 43–77)
WBC: 8.5 10*3/uL (ref 4.0–10.5)

## 2012-12-12 LAB — COMPREHENSIVE METABOLIC PANEL
AST: 13 U/L (ref 0–37)
Albumin: 2.2 g/dL — ABNORMAL LOW (ref 3.5–5.2)
Alkaline Phosphatase: 110 U/L (ref 39–117)
BUN: 37 mg/dL — ABNORMAL HIGH (ref 6–23)
Chloride: 103 mEq/L (ref 96–112)
Potassium: 5 mEq/L (ref 3.5–5.1)
Total Bilirubin: 0.5 mg/dL (ref 0.3–1.2)

## 2012-12-12 MED ORDER — WARFARIN SODIUM 5 MG PO TABS
5.0000 mg | ORAL_TABLET | Freq: Once | ORAL | Status: AC
  Administered 2012-12-12: 5 mg via ORAL
  Filled 2012-12-12: qty 1

## 2012-12-12 MED ORDER — GABAPENTIN 100 MG PO CAPS
100.0000 mg | ORAL_CAPSULE | Freq: Two times a day (BID) | ORAL | Status: DC
  Administered 2012-12-12 (×2): 100 mg via ORAL
  Filled 2012-12-12 (×7): qty 1

## 2012-12-12 NOTE — Care Management Note (Signed)
[facility] Individual Statement of Services  Patient Name:  [name]  Date:  12/12/2012  Welcome to the [facility].  Our goal is to provide you with an individualized program based on your diagnosis and situation, designed to meet your specific needs.  With this comprehensive rehabilitation program, you will be expected to participate in at least 3 hours of rehabilitation therapies Monday-Friday, with modified therapy programming on the weekends.  Your rehabilitation program will include the following services:  Physical Therapy (PT), Occupational Therapy (OT), 24 hour per day rehabilitation nursing, Case Management (RN and Social Worker), Rehabilitation Medicine, Nutrition Services and Pharmacy Services  Weekly team conferences will be held on Wednesday to discuss your progress.  Your RN Case Designer, television/film set will talk with you frequently to get your input and to update you on team discussions.  Team conferences with you and your family in attendance may also be held.  Expected length of stay: 2 WEEKS Overall anticipated outcome: MOD/I WHEELCHAIR LEVEL  Depending on your progress and recovery, your program may change.  Your RN Case Estate agent will coordinate services and will keep you informed of any changes.  Your RN Sports coach and SW names and contact numbers are listed  below.  The following services may also be recommended but are not provided by the [facility]:   Driving Evaluations  Home Health Rehabiltiation Services  Outpatient Rehabilitatation [facility]  Vocational Rehabilitation   Arrangements will be made to provide these services after discharge if needed.  Arrangements include referral to agencies that provide these services.  Your insurance has been verified to be:  Medicare & BCBS  Your primary doctor is:  Dr. Assunta Found  Pertinent information will be shared with your doctor  and your insurance company.   Social Worker:  Dossie Der, [place] [phone]  Information discussed with and copy given to patient by: Lucy Chris, 12/12/2012, 11:32 AM

## 2012-12-12 NOTE — Progress Notes (Addendum)
Occupational Therapy Session Note  Patient Details  Name: [name] MRN: [id number] Date of Birth: [birth year]  Today's Date: 12/12/2012 Time: 1400-1430 Time Calculation (min): 30 min  Short Term Goals: Week 1:  OT Short Term Goal 1 (Week 1): Pt will transfer to the [facility] with supervision. OT Short Term Goal 2 (Week 1): Pt will don pants with supervision. OT Short Term Goal 3 (Week 1): Pt will toilet with supervision.  Skilled Therapeutic Interventions/Progress Updates:  Patient resting in w/c upon arrival.  Engaged in w/c><drop arm commode transfers and toileting.  Patient performed scoot/squat pivot transfer with steady assist to the commode and mod assist back to w/c.  Patient able to perform underwear down and up with lateral leans and supervision.  Patient attempted BM with no results.  Therapy Documentation Precautions:  Precautions Precautions: Fall Restrictions Weight Bearing Restrictions: No Pain: Reports 10/10 pain in residual limb when asked however, no c/o pain with functional mobility and BADL ADL: See FIM for current functional status  Therapy/Group: Individual Therapy  Jaleen Grupp 12/12/2012, 3:55 PM

## 2012-12-12 NOTE — Progress Notes (Signed)
Social Work Patient ID: [name], male   DOB: 1934-12-29, 78 y.o.   MRN: [id number] Have left a message for daughter that pt needs sweat pants for rehab and the need for a ramp once discharged.  She is to visit this weekend.

## 2012-12-12 NOTE — Progress Notes (Signed)
Physical Therapy Note  Patient Details  Name: [name] MRN: [id number] Date of Birth: 11-15-1935 Today's Date: 12/12/2012  2:45 - 3:20 35 minutes Individual session Patient denies pain.  Theraband added to rims of wheelchair. Patient able to propel wheelchair on level tile in controlled environment 150 feet using bilateral UE's. Patient sit to stand in parallel bars with max assist with patient pulling on bars. Patient maintained static standing 40 seconds x 1 and 70 seconds x 1. Attempted to have patient take a step forward but patient unable to lift foot. Patient performed 2 sets of 5 wheelchair pushups with mod assist to get arms straight. Attempted sit to stand to walker with +1 assist and unable to get to full standing position.    Arelia Longest M 12/12/2012, 4:19 PM

## 2012-12-12 NOTE — Progress Notes (Signed)
ANTICOAGULATION CONSULT NOTE - Follow Up Consult  Pharmacy Consult for Coumadin & Lovenox Indication: Afib, PVD  Labs:  Basename 12/12/12 0550 12/11/12 0415 12/10/12 0314  HGB 8.0* 8.3* --  HCT 25.4* 26.1* 27.0*  PLT 186 181 217  APTT -- -- --  LABPROT 20.0* 17.5* 17.1*  INR 1.77* 1.48 1.43  HEPARINUNFRC -- -- 0.17*  CREATININE 1.90* 1.63* 1.73*  CKTOTAL -- -- --  CKMB -- -- --  TROPONINI -- -- --   The CrCl is unknown because both a height and weight (above a minimum accepted value) are required for this calculation.  Assessment: 78yo male on Lovenox bridge to therapeutic INR on Coumadin for Afib and PVD. Coumadin restarted 12/10/11 s/p BKA. INR remains sub-therapeutic at 1.77  Reported home regimen is Coumadin 5mg  daily except 2.5mg  Tue/Fri. No bleeding reported in chart.  Goal of Therapy:  INR 2-3 Anti-Xa level 0.6-1.2 units/ml 4hrs after LMWH dose given Monitor platelets by anticoagulation protocol: Yes   Plan:  1) Continue Lovenox 95 mg SQ Q12 hours  2) Coumadin 5mg  x 1 tonight 3) Follow up daily INR & signs/symptoms of bleeding  Thank you. Okey Regal, PharmD  12/12/2012   8:55 AM

## 2012-12-12 NOTE — Progress Notes (Signed)
Social Work Assessment and Plan Social Work Assessment and Plan  Patient Details  Name: [name] MRN: [id number] Date of Birth: 04-Mar-1935  Today's Date: 12/12/2012  Problem List:  Patient Active Problem List  Diagnosis  . Hx of CABG x 6 2003  . ICD in place, MDT May 2011  . PAF (paroxysmal atrial fibrillation)  . Diabetes mellitus type 2, insulin dependent  . CKD (chronic kidney disease) stage 4, GFR 15-29 ml/min  . Positive Coombs test  . Anemia due to chronic illness  . HTN (hypertension)  . Hypokalemia  . Ischemic cardiomyopathy, EF 25% 2D 12/12  . PVD, Lt popliteal HSRA 7/13  . Dyslipidemia  . Chronic anticoagulation  . Non-sustained ventricular tachycardia  . Sleep apnea, C-Pap intol  . Diabetic foot ulcer, S/P Lt BKA 12/08/12  . Gout  . Hypothyroidism  . ICD discharge in setting of K+ 3.0  . Acute on chronic renal failure, SCr 2.3-3.3 secondary to dehydration  . Chronic systolic CHF (congestive heart failure)  . Anemia, Hgb 11- 9.0 with hydration  . Arrhythmia, ventricular  . Sepsis  . Ulcer of left heel  . Chest pain  . Hyponatremia  . S/P angioplasty, 11/27/12 -Lt below the knee popliteal artery  . S/P unilateral BKA (below knee amputation)   Past Medical History:  Past Medical History  Diagnosis Date  . HTN (hypertension)   . PVD (peripheral vascular disease)   . COPD (chronic obstructive pulmonary disease)   . Diabetes mellitus     IDDM  . S/P CABG x 6 2003    Dr Cornelius Moras, Myoview low risk 2009  . Gout   . Paroxysmal atrial fibrillation   . Hypothyroidism   . ICD (implantable cardiac defibrillator) discharge 5/11    MDT  . Chronic renal insufficiency, stage III (moderate)   . Chronic systolic CHF (congestive heart failure)     EF 25-30% echo 12/12  . S/P angioplasty, 11/27/12 -Lt below the knee popliteal artery 11/28/2012   Past Surgical History:  Past Surgical History  Procedure Date  . Coronary artery bypass graft   . Cardiac  catheterization   . Amputation 12/08/2012    Procedure: AMPUTATION BELOW KNEE;  Surgeon: Eldred Manges, MD;  Location: [facility] OR;  Service: Orthopedics;  Laterality: Left;   Social History:  reports that he quit smoking about 9 years ago. He has quit using smokeless tobacco. He reports that he does not drink alcohol or use illicit drugs.  Family / Support Systems Marital Status: Widow/Widower How Long?: 15 years Patient Roles: Parent ChildrenKendall Flack  [phone]  [phone] Other Supports: Brett Fairy  [phone] Anticipated Caregiver: daughter and granddaughter Ability/Limitations of Caregiver: Both family members work in [employer] and can provide intermittent assistance Caregiver Availability: Intermittent Family Dynamics: Close knit family, daughter and grandduaghter live next door to pt and he plans to go there upon discharge due to less steps to get into home.  Family work together and are gone much of the day and evening.  Social History Preferred language: English Religion: Baptist Cultural Background: No issues Education: [employer] Read: Yes Write: Yes Employment Status: Retired Fish farm manager Issues: No issues Guardian/Conservator: None-according to MD pt is capable of making his own decisions   Abuse/Neglect Physical Abuse: Denies Verbal Abuse: Denies Sexual Abuse: Denies Exploitation of patient/patient's resources: Denies Self-Neglect: Denies  Emotional Status Pt's affect, behavior adn adjustment status: Pt is motivated to improve and realizes there will be pain involved.  He reports: " I will do what I can that is all i can do."  He is aware he has other issues besides his ampuatation, which makes moving harder. Recent Psychosocial Issues: Other medical issues-hands hard to grip and other leg is weaker and has wounds on it.  Being followed by [facility] RN Pyschiatric History: No issues-deferred depression  screen due to pt is coping appropriately with his surgery and ampuation.  Will continue to monitor whille he is here. Substance Abuse History: No issues  Patient / Family Perceptions, Expectations & Goals Pt/Family understanding of illness & functional limitations: Pt and daughter can explain his surgery and ampuatation, they are hopeful with time he will regain some of his independence.  Pt is realistic and aware of his challenges with this.  Discussed pt will probably be w/c leve at discharge, working toward ambulation.  He plans on getting a prothesis once his leg is healed. Premorbid pt/family roles/activities: Father, Retiree, grandfather, Home owner, Friend, etc Anticipated changes in roles/activities/participation: resume Pt/family expectations/goals: Pt states: " I want to do as much for myself as I can, but know I have challenges."  Daughter reprots: " I am hopeful with time he will do well with this, he always has been very independent."  [employer]: None Premorbid Home Care/DME Agencies: None Transportation available at discharge: [employer] referrals recommended: Support group (specify) (Amputee Support group)  Discharge Planning Living Arrangements: Alone Support Systems: Children;Friends/neighbors;Church/faith community Type of Residence: Private residence Insurance Resources: Administrator (specify) (BCBS OUT of STATE) Financial Resources: Social Security Financial Screen Referred: No Living Expenses: Own Money Management: Patient Do you have any problems obtaining your medications?: No Home Management: Daughter and granddaughter do housekeeping and pt does meal preparation Patient/Family Preliminary Plans: Plans to go to daughter's home since only four steps to get in, he has nine steps at his home next door.  Discussed he will need a ramp at daughter's home and to begin pursuing this.  Main goal is for pt to get mod/i with  transfers to be able to be home alone, since he does not have 24 hrour care at discharge. Social Work Anticipated Follow Up Needs: HH/OP;Support Group DC Planning Additional Notes/Comments: If can not get mod/i with transfers and be safe to be alone at home, may need short term NHP.  See how pt does here.  Clinical Impression Pleasant gentleman who is motivated and realistic regarding his recovery and ability to be mod/i.  He is blunt and says it like it is kind of guy.  Family is supportive but can not provide 24 hour care at discharge. See what team feels and work on a safe discharge plan.  Pt working on ramp already.  Lucy Chris 12/12/2012, 1:40 PM

## 2012-12-12 NOTE — Evaluation (Signed)
Physical Therapy Assessment and Plan  Patient Details  Name: [name] MRN: [id number] Date of Birth: [birth year]  PT Diagnosis: Muscle weakness; LE amputation Rehab Potential: good ELOS: 2 weeks  Today's Date: 12/12/2012 Time: 9:05 - 10:10 Time Calculation (min): 65 min  Problem List:  Patient Active Problem List  Diagnosis  . Hx of CABG x 6 2003  . ICD in place, MDT May 2011  . PAF (paroxysmal atrial fibrillation)  . Diabetes mellitus type 2, insulin dependent  . CKD (chronic kidney disease) stage 4, GFR 15-29 ml/min  . Positive Coombs test  . Anemia due to chronic illness  . HTN (hypertension)  . Hypokalemia  . Ischemic cardiomyopathy, EF 25% 2D 12/12  . PVD, Lt popliteal HSRA 7/13  . Dyslipidemia  . Chronic anticoagulation  . Non-sustained ventricular tachycardia  . Sleep apnea, C-Pap intol  . Diabetic foot ulcer, S/P Lt BKA 12/08/12  . Gout  . Hypothyroidism  . ICD discharge in setting of K+ 3.0  . Acute on chronic renal failure, SCr 2.3-3.3 secondary to dehydration  . Chronic systolic CHF (congestive heart failure)  . Anemia, Hgb 11- 9.0 with hydration  . Arrhythmia, ventricular  . Sepsis  . Ulcer of left heel  . Chest pain  . Hyponatremia  . S/P angioplasty, 11/27/12 -Lt below the knee popliteal artery  . S/P unilateral BKA (below knee amputation)    Past Medical History:  Past Medical History  Diagnosis Date  . HTN (hypertension)   . PVD (peripheral vascular disease)   . COPD (chronic obstructive pulmonary disease)   . Diabetes mellitus     IDDM  . S/P CABG x 6 2003    Dr Cornelius Moras, Myoview low risk 2009  . Gout   . Paroxysmal atrial fibrillation   . Hypothyroidism   . ICD (implantable cardiac defibrillator) discharge 5/11    MDT  . Chronic renal insufficiency, stage III (moderate)   . Chronic systolic CHF (congestive heart failure)     EF 25-30% echo 12/12  . S/P angioplasty, 11/27/12 -Lt below the knee popliteal artery 11/28/2012   Past  Surgical History:  Past Surgical History  Procedure Date  . Coronary artery bypass graft   . Cardiac catheterization   . Amputation 12/08/2012    Procedure: AMPUTATION BELOW KNEE;  Surgeon: Eldred Manges, MD;  Location: [facility] OR;  Service: Orthopedics;  Laterality: Left;    Assessment & Plan Clinical Impression: [name] is a 78 y.o. right-handed male with history of diabetes mellitus with peripheral neuropathy, coronary artery disease with CABG, PAF with ICD on chronic Coumadin therapy and chronic systolic congestive heart failure. Patient was in the 11/24/2012 with progressive nonhealing ulcers of left lower extremity. Angiography revealed moderate stenosis left SFA and underwent angioplasty. Patient's chronic Coumadin was resumed. 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 Ophelia Charter. Chronic Coumadin has been resumed and plan Lovenox for INR therapeutic. Hospital course anemia 8.3 and monitored. Patient also with stage II pressure ulcer right heel with followup per wound care nurse and Prevalon boot in place. Physical and occupational therapy evaluations completed an ongoing. Physical therapy is recommended physical medicine rehabilitation consult to consider inpatient rehabilitation services. Patient was felt to be a date candidate for inpatient rehabilitation services and was admitted for a comprehensive rehabilitation program on 12/11/12.    Patient currently requires max with mobility secondary to muscle weakness.  Prior to  hospitalization, patient was modified independent  with mobility and lived with Alone in a House home.  Home access is 9 (9 to his home 4 to daughter's home)Stairs to enter.  Patient will benefit from skilled PT intervention to maximize safe functional mobility, minimize fall risk and decrease caregiver burden for planned discharge home with intermittent assist.  Anticipate patient will benefit from  follow up [facility] at discharge.    PT Evaluation Precautions/Restrictions  General Oxygen Therapy SpO2: 99 % Pain Patient denies pain when asked. Patient does report that he has experienced phantom pain at times.  Home Living/Prior Functioning Patient used quad cane or rolling walker for ambulation prior to admission.  Sensation Sensation Light Touch: Impaired by gross assessment Stereognosis: Impaired by gross assessment Hot/Cold: Not tested Proprioception: Not tested Motor  Motor Motor - Skilled Clinical Observations: generalized muscle weakness  Mobility Bed Mobility Supine to Sit: 5: Supervision Sit to Supine: 5: Supervision Transfers Sit to Stand: 1: +2 Total assist Stand to Sit: 2: Max assist Locomotion  Ambulation Ambulation/Gait Assistance: Not tested (comment) (Attempted in parallel bars; patient unable to lift foot.) Stairs / Additional Locomotion Ramp: Not tested (comment) Naval architect Mobility: Yes Wheelchair Assistance: 3: Mod Education officer, museum: Both upper extremities (patient had difficulty gripping rim - "too slick") Wheelchair Parts Management: Needs assistance Distance: 40 feet   Balance Balance Balance Assessed: Yes Static Sitting Balance Static Sitting - Balance Support: No upper extremity supported Static Sitting - Level of Assistance: 5: Stand by assistance Dynamic Sitting Balance Reach (Patient is able to reach ___ inches to right, left, forward, back): Patient is able to reach 5 inches to right, left , and forward. Static Standing Balance Static Standing - Balance Support: Bilateral upper extremity supported Static Standing - Level of Assistance: 3: Mod assist Static Standing - Comment/# of Minutes: Patient stood in parallel bars 40 seconds and 70 seconds with mod assist once standing. Patient max assist sit to stand. Extremity Assessment      RLE Strength RLE Overall Strength: Deficits RLE Overall Strength  Comments: Hip flexion 3+/5, knee extension 4/5, Knee flexion 4/5. patient has some active dorsiflexion not full range LLE Assessment LLE Assessment: Exceptions to WFL LLE AROM (degrees) Left Knee Extension: -8  Left Knee Flexion: 80  LLE Strength LLE Overall Strength: Deficits LLE Overall Strength Comments: hip flexion 3/5, knee flexion/extension 3+/5  FIM:  FIM - Locomotion: Wheelchair Distance: 40 feet FIM - Locomotion: Ambulation Ambulation/Gait Assistance: Not tested (comment) (Attempted in parallel bars; patient unable to lift foot.)   Refer to Care Plan for Long Term Goals  Recommendations for other services: None  Discharge Criteria: Patient will be discharged from PT if patient refuses treatment 3 consecutive times without medical reason, if treatment goals not met, if there is a change in medical status, if patient makes no progress towards goals or if patient is discharged from hospital.  The above assessment, treatment plan, treatment alternatives and goals were discussed and mutually agreed upon: by patient  Treatment began during session focusing on bed mobility and LE strengthening. Patient transferred by stand pivot with +2 max assist from wheelchair to [facility]. Patient max assist from [facility] to wheelchair. Patient with an uncontrolled descent into wheelchair. Patient required assist with wheelchair mobility due to decreased grip. Patient performed scooting transfer wheelchair to mat with mod assist. Patient sit to supine with supervision on mat. Patient performed LE strengthening/ROM exercises including: hip flexion, hip adduction/abduction, towel squeezes, and knee extension x 10  reps each. Patient supine to sit with supervision. Patient performed scoot transfer mat to wheelchair with mod assist. Patient returned to room and left in wheelchair.   Arelia Longest M 12/12/2012, 4:07 PM

## 2012-12-12 NOTE — Progress Notes (Signed)
Nursing Note: Pt has attempted to void on more than one occasion and no voids yet.A: scanned for >450 cc.R" Pt in and out cathed by Janelle Floor Kidd,NT for 550 cc of urine.wbb

## 2012-12-12 NOTE — Progress Notes (Signed)
Nursing Note : Pt admitted earlier by day shift RN. Pt is L BKA w/ old dressing removed by day shift nurse.Per report,old dressing was stuck to pt's skin and was saturated w/ saline to soften and then removed.Upon initial assessment of L bka /wound has sutures and bright,red bloody drainage noted along entire incision,not bleeding .but blood along incision noted.Cleansed w/ sterile saline .Noted  above incision at knee level 2 abrasions 2 cm in length and redness to skin.Dressed per orders.Pt also has other areas to skin 1. r heel has 4x4 size mepelex dressing and has prevalon boot to keep heel off bed.2. Pt has dressing to r inner shin =4x4 mepelex dressing in place.Skin is otherwise intact.wbb

## 2012-12-12 NOTE — Progress Notes (Signed)
Social Work Patient ID: [name], male   DOB: 02-23-1935, 78 y.o.   MRN: [id number] Spoke with daughter via telephone to discuss discharge plan.  She is looking into a ramp for pt.  She plans to switch houses with Dad and will need To move both households.  Unsure if she understands it would be good for someone to be with pt even if just at night.  Pt was falling a lot at home Prior to admission, so may need 24 hr supervision for safety at discharge.  Informed he would be w/c level and if home is w/c accessible.  May need to Discuss short term NHP if plan is not safe and all that needs to get done before discharge is not.

## 2012-12-12 NOTE — Evaluation (Signed)
Occupational Therapy Assessment and Plan  Patient Details  Name: [name] MRN: [id number] Date of Birth: August 28, 1935  OT Diagnosis: acute pain and muscle weakness (generalized) Rehab Potential:   ELOS: 2 weeks   Today's Date: 12/12/2012 Time: 0805-0905 Time Calculation (min): 60 min  1:1 Pt seen for initial evaluation and ADL retraining at EOB for B/D.  Pt did very well with lateral leans onto elbows onto bed to wash bottom and don clothing.  Pt did fatigue easily, but overall good participation.  Problem List:  Patient Active Problem List  Diagnosis  . Hx of CABG x 6 2003  . ICD in place, MDT May 2011  . PAF (paroxysmal atrial fibrillation)  . Diabetes mellitus type 2, insulin dependent  . CKD (chronic kidney disease) stage 4, GFR 15-29 ml/min  . Positive Coombs test  . Anemia due to chronic illness  . HTN (hypertension)  . Hypokalemia  . Ischemic cardiomyopathy, EF 25% 2D 12/12  . PVD, Lt popliteal HSRA 7/13  . Dyslipidemia  . Chronic anticoagulation  . Non-sustained ventricular tachycardia  . Sleep apnea, C-Pap intol  . Diabetic foot ulcer, S/P Lt BKA 12/08/12  . Gout  . Hypothyroidism  . ICD discharge in setting of K+ 3.0  . Acute on chronic renal failure, SCr 2.3-3.3 secondary to dehydration  . Chronic systolic CHF (congestive heart failure)  . Anemia, Hgb 11- 9.0 with hydration  . Arrhythmia, ventricular  . Sepsis  . Ulcer of left heel  . Chest pain  . Hyponatremia  . S/P angioplasty, 11/27/12 -Lt below the knee popliteal artery  . S/P unilateral BKA (below knee amputation)    Past Medical History:  Past Medical History  Diagnosis Date  . HTN (hypertension)   . PVD (peripheral vascular disease)   . COPD (chronic obstructive pulmonary disease)   . Diabetes mellitus     IDDM  . S/P CABG x 6 2003    Dr Cornelius Moras, Myoview low risk 2009  . Gout   . Paroxysmal atrial fibrillation   . Hypothyroidism   . ICD (implantable cardiac defibrillator) discharge 5/11      MDT  . Chronic renal insufficiency, stage III (moderate)   . Chronic systolic CHF (congestive heart failure)     EF 25-30% echo 12/12  . S/P angioplasty, 11/27/12 -Lt below the knee popliteal artery 11/28/2012   Past Surgical History:  Past Surgical History  Procedure Date  . Coronary artery bypass graft   . Cardiac catheterization   . Amputation 12/08/2012    Procedure: AMPUTATION BELOW KNEE;  Surgeon: Eldred Manges, MD;  Location: [facility] OR;  Service: Orthopedics;  Laterality: Left;    Assessment & Plan Clinical Impression:[name] is a 78 y.o. right-handed male with history of diabetes mellitus with peripheral neuropathy, coronary artery disease with CABG, PAF with ICD on chronic Coumadin therapy and chronic systolic congestive heart failure. Patient was in the 11/24/2012 with progressive nonhealing ulcers of left lower extremity. Angiography revealed moderate stenosis left SFA and underwent angioplasty. Patient's chronic Coumadin was resumed. 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 Ophelia Charter. Chronic Coumadin has been resumed and plan Lovenox for INR therapeutic. Hospital course anemia 8.3 and monitored. Patient also with stage II pressure ulcer right heel with followup per wound care nurse and Prevalon boot in place. Physical and occupational therapy evaluations completed an ongoing. Physical therapy is recommended physical medicine rehabilitation consult  to consider inpatient rehabilitation services. Patient was felt to be a date candidate for inpatient rehabilitation services and was admitted for a comprehensive rehabilitation program. Patient transferred to CIR on 12/11/2012 .    Patient currently requires mod with basic self-care skills secondary to muscle weakness, decreased cardiorespiratoy endurance and decreased standing balance.  Prior to hospitalization, patient was living alone, fully independent and  driving. Patient will benefit from skilled intervention to increase independence with basic self-care skills prior to discharge home with care partner.  Anticipate patient will require intermittent supervision and follow up home health.  OT - End of Session Activity Tolerance: Tolerates 10 - 20 min activity with multiple rests OT Assessment Barriers to Discharge: Decreased caregiver support OT Plan OT Intensity: Minimum of 1-2 x/day, 45 to 90 minutes OT Frequency: 5 out of 7 days OT Duration/Estimated Length of Stay: 2 weeks OT Treatment/Interventions: Balance/vestibular training;Discharge planning;DME/adaptive equipment instruction;Functional mobility training;Patient/family education;Therapeutic Activities;Therapeutic Exercise;UE/LE Strength taining/ROM OT Recommendation Patient destination: Home Follow Up Recommendations: Home health OT Equipment Recommended: Tub/shower bench;3 in 1 bedside comode  OT Evaluation Precautions/Restrictions  Precautions Precautions: Fall Restrictions Weight Bearing Restrictions: No    Pain Pain Assessment Pain Assessment: No/denies pain (during therapy session) Home Living/Prior Functioning Home Living Lives With: Alone Available Help at Discharge: Available PRN/intermittently;Family Type of Home: House Home Access: Stairs to enter [employer] of Steps: 9 (9 to his home 4 to daughter's home) Entrance Stairs-Rails: None Home Layout: One level Bathroom Shower/Tub: Forensic scientist: Handicapped height How Accessible: Accessible via walker Home Adaptive Equipment: Quad cane;Walker - rolling Prior Function Level of Independence: Independent with basic ADLs;Requires assistive device for independence Able to Take Stairs?: Yes Driving: Yes Vocation: Retired ADL Refer to [place]   Vision/Perception  Vision - History Baseline Vision: Wears glasses only for reading Patient Visual Report: No change from  baseline Vision - Assessment Eye Alignment: Within Functional Limits Vision Assessment: Vision not tested Perception Perception: Within Functional Limits Praxis Praxis: Intact  Cognition Overall Cognitive Status: Appears within functional limits for tasks assessed Arousal/Alertness: Awake/alert Orientation Level: Oriented X4 Sensation Sensation Light Touch: Impaired by gross assessment (RLE) Stereognosis: Impaired by gross assessment Hot/Cold: Appears Intact Coordination Gross Motor Movements are Fluid and Coordinated: Yes Fine Motor Movements are Fluid and Coordinated: No (RA in bilateral hands) Motor  Motor Motor - Skilled Clinical Observations: Generalized muscle weakness Mobility  Refer to FIM    Balance Static Standing Balance Static Standing - Level of Assistance: 2: Max assist Extremity/Trunk Assessment RUE Assessment RUE Assessment: Within Functional Limits LUE Assessment LUE Assessment: Exceptions to [facility] (shoulder AROM to 130 degrees-)  See FIM for current functional status Refer to Care Plan for Long Term Goals  Recommendations for other services: None  Discharge Criteria: Patient will be discharged from OT if patient refuses treatment 3 consecutive times without medical reason, if treatment goals not met, if there is a change in medical status, if patient makes no progress towards goals or if patient is discharged from hospital.  The above assessment, treatment plan, treatment alternatives and goals were discussed and mutually agreed upon: by patient  Garritt Molyneux 12/12/2012, 11:59 AM

## 2012-12-12 NOTE — Progress Notes (Signed)
Patient information reviewed and entered into eRehab system by Ragina Fenter, RN, CRRN, PPS Coordinator.  Information including medical coding and functional independence measure will be reviewed and updated through discharge.     Per nursing patient was given "Data Collection Information Summary for Patients in Inpatient Rehabilitation Facilities with attached "Privacy Act Statement-Health Care Records" upon admission.  

## 2012-12-13 ENCOUNTER — Encounter (HOSPITAL_COMMUNITY): Payer: Medicare Other | Admitting: Occupational Therapy

## 2012-12-13 ENCOUNTER — Inpatient Hospital Stay (HOSPITAL_COMMUNITY): Payer: Medicare Other | Admitting: Occupational Therapy

## 2012-12-13 ENCOUNTER — Inpatient Hospital Stay (HOSPITAL_COMMUNITY): Payer: MEDICARE | Admitting: Physical Therapy

## 2012-12-13 LAB — GLUCOSE, CAPILLARY
Glucose-Capillary: 159 mg/dL — ABNORMAL HIGH (ref 70–99)
Glucose-Capillary: 158 mg/dL — ABNORMAL HIGH (ref 70–99)

## 2012-12-13 LAB — PROTIME-INR
INR: 1.9 — ABNORMAL HIGH (ref 0.00–1.49)
Prothrombin Time: 21.1 seconds — ABNORMAL HIGH (ref 11.6–15.2)

## 2012-12-13 MED ORDER — WARFARIN SODIUM 5 MG PO TABS
5.0000 mg | ORAL_TABLET | Freq: Once | ORAL | Status: AC
  Administered 2012-12-13: 5 mg via ORAL
  Filled 2012-12-13: qty 1

## 2012-12-13 MED ORDER — TAMSULOSIN HCL 0.4 MG PO CAPS
0.4000 mg | ORAL_CAPSULE | Freq: Every day | ORAL | Status: DC
  Administered 2012-12-13 – 2012-12-23 (×11): 0.4 mg via ORAL
  Filled 2012-12-13 (×13): qty 1

## 2012-12-13 MED ORDER — GABAPENTIN 100 MG PO CAPS
100.0000 mg | ORAL_CAPSULE | Freq: Three times a day (TID) | ORAL | Status: DC
  Administered 2012-12-13 – 2012-12-24 (×35): 100 mg via ORAL
  Filled 2012-12-13 (×40): qty 1

## 2012-12-13 MED ORDER — BISACODYL 10 MG RE SUPP
10.0000 mg | Freq: Every day | RECTAL | Status: DC | PRN
  Administered 2012-12-13 – 2012-12-23 (×3): 10 mg via RECTAL
  Filled 2012-12-13 (×4): qty 1

## 2012-12-13 MED ORDER — ENOXAPARIN SODIUM 100 MG/ML ~~LOC~~ SOLN
100.0000 mg | Freq: Two times a day (BID) | SUBCUTANEOUS | Status: AC
  Administered 2012-12-13 – 2012-12-15 (×5): 100 mg via SUBCUTANEOUS
  Filled 2012-12-13 (×6): qty 1

## 2012-12-13 MED ORDER — SENNOSIDES-DOCUSATE SODIUM 8.6-50 MG PO TABS
2.0000 | ORAL_TABLET | Freq: Two times a day (BID) | ORAL | Status: DC
  Administered 2012-12-13 – 2012-12-23 (×18): 2 via ORAL
  Filled 2012-12-13 (×21): qty 2

## 2012-12-13 MED ORDER — BETHANECHOL CHLORIDE 25 MG PO TABS
25.0000 mg | ORAL_TABLET | Freq: Three times a day (TID) | ORAL | Status: DC
  Administered 2012-12-13 (×3): 25 mg via ORAL
  Filled 2012-12-13 (×8): qty 1

## 2012-12-13 NOTE — Plan of Care (Signed)
Problem: RH BLADDER ELIMINATION Goal: RH STG MANAGE BLADDER WITH ASSISTANCE STG Manage Bladder With Assistance min assist  Outcome: Not Progressing Requiring I/O cathing     Problem: RH SKIN INTEGRITY Goal: RH STG ABLE TO PERFORM INCISION/WOUND CARE W/ASSISTANCE STG Able To Perform Incision/Wound Care With Assistance. Moderate assist  Outcome: Not Progressing Patient requires total assist at this time

## 2012-12-13 NOTE — Progress Notes (Signed)
Occupational Therapy Session Note  Patient Details  Name: [name] MRN: [id number] Date of Birth: [birth year]  Today's Date: 12/13/2012 Time: 4098-1191 Time Calculation (min): 41 min  Short Term Goals: Week 1:  OT Short Term Goal 1 (Week 1): Pt will transfer to the [facility] with supervision. OT Short Term Goal 2 (Week 1): Pt will don pants with supervision. OT Short Term Goal 3 (Week 1): Pt will toilet with supervision.  Skilled Therapeutic Interventions/Progress Updates:    1:1 focus on transfer training. Self propelled w/c half way to gym working on maneuvering w/c, UE strengthening and endurance. Worked on w/c setup in prep for transfers inlculding positioning, locking breaks and removal of leg rest. Transferred back and forth to mat 4x in session working on bottom clearance and forward weight shift. Sitting pushing up on mat and with blocks addressing forward weight shift and achieving bottom clearance in prep for transfer. Transfer w/c back to bed to rest after session with mod A (pt with increased fatigue at end of session.)   Therapy Documentation Precautions:  Precautions Precautions: Fall Restrictions Weight Bearing Restrictions: No    Vital Signs: Therapy Vitals Pulse Rate: 55  BP: 113/49 mmHg Pain: Pain Assessment Pain Assessment: No/denies pain (at times when no phantom pain) Pain Score: 10-Worst pain ever Pain Type: Surgical pain;Phantom pain Pain Location: Leg Pain Orientation: Left Pain Descriptors: Aching;Throbbing Pain Onset: On-going Pain Intervention(s): Medication (See eMAR)  See FIM for current functional status  Therapy/Group: Individual Therapy  Roney Mans [facility] 12/13/2012, 1:41 PM

## 2012-12-13 NOTE — Progress Notes (Signed)
Physical Therapy Session Note  Patient Details  Name: [name] MRN: [id number] Date of Birth: 1935-08-09  Today's Date: 12/13/2012 Time: 1430-1530 Time Calculation (min): 60 min  Short Term Goals: Week 1:  PT Short Term Goal 1 (Week 1): Patient will perform wheelchair to mat or bed transfers with min assist. PT Short Term Goal 2 (Week 1): Patient will propel wheelchair 100 feet with bilateral UE's in controlled environment. PT Short Term Goal 3 (Week 1): Patient will sit to stand with +1 mod assist PT Short Term Goal 4 (Week 1): Patient will maintain standing 2 minutes with minimal assistance and rolling walker PT Short Term Goal 5 (Week 1): Patient will participate in dynamic sitting balance activity 5 minutes with supervision edge of mat.  Therapy Documentation Precautions:  Precautions: Fall Weight Bearing Restrictions: No Pain: Pain Assessment Pain Assessment: No/denies pain (at times when no phantom pain) Pain Score: 10-Worst pain ever Pain Type: Surgical pain;Phantom pain Pain Location: Leg Pain Orientation: Left Pain Descriptors: Aching;Throbbing Pain Onset: On-going Pain Intervention(s): Medication (See eMAR)   Wheelchair Management:(15')  200' on level tile floor with turning around corners.  Brake application with verbal cues.  Changed w/c into higher seating due to height of patient and difficulty in transferring sit<->stand. Therapeutic Activity:(45')  Transfer Training in parallel bars with Max-A and verbal cues for placement and sequencing multiple times and Mod-A after changing w/c to a higher seating position.  Patient was assisted Back to bed via slideboard with min-A.   Therapy/Group: Individual Therapy  Rex Kras 12/13/2012, 2:32 PM

## 2012-12-13 NOTE — Progress Notes (Signed)
Patient ID: [name], male   DOB: [birth year], 78 y.o.   MRN: [id number] Subjective/Complaints: Pain in L stump and phantom pain  Review of Systems  Gastrointestinal: Positive for constipation.  Musculoskeletal: Positive for joint pain.  Psychiatric/Behavioral: The patient is nervous/anxious.   All other systems reviewed and are negative.    Objective: Vital Signs: Blood pressure 160/78, pulse 59, temperature 98.3 F (36.8 C), temperature source Oral, resp. rate 18, weight 103.3 kg (227 lb 11.8 oz), SpO2 99.00%. No results found. Results for orders placed during the hospital encounter of 12/11/12 (from the past 72 hour(s))  GLUCOSE, CAPILLARY     Status: Abnormal   Collection Time   12/11/12  9:37 PM      Component Value Range Comment   Glucose-Capillary 137 (*) 70 - 99 mg/dL   CBC WITH DIFFERENTIAL     Status: Abnormal   Collection Time   12/12/12  5:50 AM      Component Value Range Comment   WBC 8.5  4.0 - 10.5 K/uL    RBC 2.88 (*) 4.22 - 5.81 MIL/uL    Hemoglobin 8.0 (*) 13.0 - 17.0 g/dL    HCT 40.9 (*) 81.1 - 52.0 %    MCV 88.2  78.0 - 100.0 fL    MCH 27.8  26.0 - 34.0 pg    MCHC 31.5  30.0 - 36.0 g/dL    RDW 91.4 (*) 78.2 - 15.5 %    Platelets 186  150 - 400 K/uL    Neutrophils Relative 72  43 - 77 %    Neutro Abs 6.1  1.7 - 7.7 K/uL    Lymphocytes Relative 16  12 - 46 %    Lymphs Abs 1.3  0.7 - 4.0 K/uL    Monocytes Relative 9  3 - 12 %    Monocytes Absolute 0.7  0.1 - 1.0 K/uL    Eosinophils Relative 4  0 - 5 %    Eosinophils Absolute 0.3  0.0 - 0.7 K/uL    Basophils Relative 0  0 - 1 %    Basophils Absolute 0.0  0.0 - 0.1 K/uL   COMPREHENSIVE METABOLIC PANEL     Status: Abnormal   Collection Time   12/12/12  5:50 AM      Component Value Range Comment   Sodium 134 (*) 135 - 145 mEq/L    Potassium 5.0  3.5 - 5.1 mEq/L    Chloride 103  96 - 112 mEq/L    CO2 20  19 - 32 mEq/L    Glucose, Bld 151 (*) 70 - 99 mg/dL    BUN 37 (*) 6 - 23 mg/dL    Creatinine,  Ser 9.56 (*) 0.50 - 1.35 mg/dL    Calcium 8.9  8.4 - 21.3 mg/dL    Total Protein 5.9 (*) 6.0 - 8.3 g/dL    Albumin 2.2 (*) 3.5 - 5.2 g/dL    AST 13  0 - 37 U/L    ALT <5  0 - 53 U/L    Alkaline Phosphatase 110  39 - 117 U/L    Total Bilirubin 0.5  0.3 - 1.2 mg/dL    GFR calc non Af Amer 33 (*) >90 mL/min    GFR calc Af Amer 38 (*) >90 mL/min   PROTIME-INR     Status: Abnormal   Collection Time   12/12/12  5:50 AM      Component Value Range Comment   Prothrombin Time 20.0 (*)  11.6 - 15.2 seconds    INR 1.77 (*) 0.00 - 1.49   GLUCOSE, CAPILLARY     Status: Abnormal   Collection Time   12/12/12  7:17 AM      Component Value Range Comment   Glucose-Capillary 141 (*) 70 - 99 mg/dL    Comment 1 Notify RN     GLUCOSE, CAPILLARY     Status: Abnormal   Collection Time   12/12/12 11:13 AM      Component Value Range Comment   Glucose-Capillary 147 (*) 70 - 99 mg/dL    Comment 1 Notify RN     GLUCOSE, CAPILLARY     Status: Abnormal   Collection Time   12/12/12  4:15 PM      Component Value Range Comment   Glucose-Capillary 155 (*) 70 - 99 mg/dL    Comment 1 Notify RN     GLUCOSE, CAPILLARY     Status: Abnormal   Collection Time   12/12/12  9:05 PM      Component Value Range Comment   Glucose-Capillary 135 (*) 70 - 99 mg/dL    Comment 1 Notify RN     GLUCOSE, CAPILLARY     Status: Abnormal   Collection Time   12/13/12  7:20 AM      Component Value Range Comment   Glucose-Capillary 159 (*) 70 - 99 mg/dL    Comment 1 Notify RN        HEENT: normal Cardio: RRR Resp: CTA B/L GI: BS positive and Distention Extremity:  Edema Left stump Skin:   Wound C/D/I and no drainage or erythema Neuro: Alert/Oriented, Anxious and Abnormal Sensory reduced sensory R foot Musc/Skel:  Other charcot deformity R ankle Gen NAD   Assessment/Plan: 1. Functional deficits secondary to L BKA which require 3+ hours per day of interdisciplinary therapy in a comprehensive inpatient rehab setting. Physiatrist  is providing close team supervision and 24 hour management of active medical problems listed below. Physiatrist and rehab team continue to assess barriers to discharge/monitor patient progress toward functional and medical goals. FIM: FIM - Bathing Bathing Steps Patient Completed: Chest;Right Arm;Left Arm;Abdomen;Front perineal area;Buttocks;Right upper leg;Left upper leg (sitting on EOB) Bathing: 4: Min-Patient completes 8-9 62f 10 parts or 75+ percent  FIM - Upper Body Dressing/Undressing Upper body dressing/undressing steps patient completed: Thread/unthread left sleeve of pullover shirt/dress;Pull shirt over trunk;Thread/unthread right sleeve of pullover shirt/dresss;Put head through opening of pull over shirt/dress Upper body dressing/undressing: 5: Set-up assist to: Obtain clothing/put away FIM - Lower Body Dressing/Undressing Lower body dressing/undressing steps patient completed: Thread/unthread left underwear leg;Pull underwear up/down (lateral leans on bed) Lower body dressing/undressing: 3: Mod-Patient completed 50-74% of tasks  FIM - Toileting Toileting steps completed by patient: Adjust clothing prior to toileting;Performs perineal hygiene;Adjust clothing after toileting Toileting: 4: Steadying assist  FIM - Diplomatic Services operational officer Devices: Bedside commode (HD drop arm commode) Toilet Transfers: 4-To toilet/BSC: Min A (steadying Pt. > 75%);3-From toilet/BSC: Mod A (lift or lower assist)  FIM - Bed/Chair Transfer Bed/Chair Transfer: 5: Supine > Sit: Supervision (verbal cues/safety issues);5: Sit > Supine: Supervision (verbal cues/safety issues);3: Bed > Chair or W/C: Mod A (lift or lower assist);3: Chair or W/C > Bed: Mod A (lift or lower assist)  FIM - Locomotion: Wheelchair Distance: 150 feet Locomotion: Wheelchair: 4: Travels 150 ft or more: maneuvers on rugs and over door sillls with minimal assistance (Pt.>75%) FIM - Locomotion:  Ambulation Ambulation/Gait Assistance: Not tested (comment) (Attempted in parallel  bars; patient unable to lift foot.) Locomotion: Ambulation: 0: Activity did not occur  Comprehension Comprehension Mode: Auditory Comprehension: 5-Understands basic 90% of the time/requires cueing < 10% of the time  Expression Expression Mode: Verbal Expression: 5-Expresses basic 90% of the time/requires cueing < 10% of the time.  Social Interaction Social Interaction: 5-Interacts appropriately 90% of the time - Needs monitoring or encouragement for participation or interaction.  Problem Solving Problem Solving: 3-Solves basic 50 - 74% of the time/requires cueing 25 - 49% of the time  Memory Memory: 3-Recognizes or recalls 50 - 74% of the time/requires cueing 25 - 49% of the time  Medical Problem List and Plan:  1. 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 DVT Prophylaxis/Anticoagulation: Chronic Coumadin therapy for history of PAF/ICD. Continue Lovenox for INR greater than 2.00. Monitor for a bleeding episodes.  3. Pain Management: Percocet, Robaxin as needed. Monitor with increased mobility  4. Neuropsych: This patient Is capable of making decisions on his/her own behalf.  5. Postoperative anemia. Latest hemoglobin 8.3. Followup CBC. Patient currently asymptomatic  6. Stage II pressure ulcer right heel. Continue Prevalon boot. Followup per wound care nurse  7. Hypertension. Isordil 20 mg 3 times a day, hydralazine 25 mg 3 times a day, Lasix 80 mg daily, coreg 25 mg twice a day. Monitor with increased mobility  8. Chronic systolic congestive heart failure. Continue Lasix 80 mg daily and monitor for any signs of fluid overload  9. Diabetes mellitus with peripheral neuropathy. Latest hemoglobin A1c 8.4. Lantus insulin 12 units twice a day. Patient was taking 10 units Lantus insulin twice daily prior to admission. Check blood sugars a.c. and at bedtime  10. Hypothyroidism. Synthroid   11. Hyperlipidemia. Zocor  12. History of gout. Allopurinol. Monitor for any signs of flare up  13. Chronic renal insufficiency. Baseline creatinine 2.33. Followup chemistries  14. Coronary artery disease with CABG/ICD. No chest pain or shortness of breath. Followup cardiology services as needed     LOS (Days) 2 A FACE TO FACE EVALUATION WAS PERFORMED  KIRSTEINS,ANDREW E 12/13/2012, 8:01 AM

## 2012-12-13 NOTE — Plan of Care (Signed)
Problem: RH BOWEL ELIMINATION Goal: RH STG MANAGE BOWEL WITH ASSISTANCE STG Manage Bowel with Assistance. Min assist  Outcome: Not Progressing LBM 12-10-11 sorbitol given with no results Goal: RH STG MANAGE BOWEL W/MEDICATION W/ASSISTANCE STG Manage Bowel with Medication with Assistance. Min assist  Outcome: Not Progressing LBM 12-10-11 sorbitol given with no results Goal: RH STG MANAGE BOWEL W/EQUIPMENT W/ASSISTANCE STG Manage Bowel With Equipment With Assistance min assist  Outcome: Not Progressing LBM 12-10-11 sorbitol given with no results Goal: RH OTHER STG BOWEL ELIMINATION GOALS W/ASSIST Other STG Bowel Elimination Goals With Assistance. Min assist  Outcome: Not Progressing LBM 12-10-11 sorbitol given with no results Goal: Bowel movement every 2 days Min assist  Outcome: Not Progressing LBM 12-10-11 sorbitol given with no results Goal: RH STG MANAGE BOWEL WITH ASSISTANCE Min assist  Outcome: Not Progressing LBM 12-10-11 sorbitol given with no results  Problem: RH BLADDER ELIMINATION Goal: RH STG MANAGE BLADDER WITH ASSISTANCE STG Manage Bladder With Assistance min assist  Outcome: Not Progressing Patient requiring in and out caths prn for no voids or pvr greater than 350 Goal: RH STG MANAGE BLADDER WITH MEDICATION WITH ASSISTANCE STG Manage Bladder With Medication With Assistance. Min assist  Outcome: Progressing Patient requiring in and out caths prn for no voids or pvr greater than 350 Goal: RH STG MANAGE BLADDER WITH EQUIPMENT WITH ASSISTANCE STG Manage Bladder With Equipment With Assistance min assist  Outcome: Progressing Patient requiring in and out caths prn for no voids or pvr greater than 350

## 2012-12-13 NOTE — Progress Notes (Signed)
Occupational Therapy Session Note  Patient Details  Name: [name] MRN: [id number] Date of Birth: December 13, 1934  Today's Date: 12/13/2012 Time: 5366-4403 Time Calculation (min): 45 min  Short Term Goals: Week 1:  OT Short Term Goal 1 (Week 1): Pt will transfer to the [facility] with supervision. OT Short Term Goal 2 (Week 1): Pt will don pants with supervision. OT Short Term Goal 3 (Week 1): Pt will toilet with supervision.  Skilled Therapeutic Interventions/Progress Updates:    1:1 self care retraining sitting EOB, focus on bed mobility to come to EOB, dynamic sitting balance with lateral leans for clothing management and washing peri area. Pt needed A with threading his righ tLE in underwear and bathing and donning sock and shoe on right LE- difficulty coming forward. Rewrapped LE with ace. Scoot pivot over to the w/c - decreased bottom clearance off bed into w/c. Pt requires rest break for heavy breathing.   Therapy Documentation Precautions:  Precautions Precautions: Fall Restrictions Weight Bearing Restrictions: No General:   Vital Signs: Therapy Vitals Temp: 98.3 F (36.8 C) Temp src: Oral Pulse Rate: 59  Resp: 18  BP: 160/78 mmHg Patient Position, if appropriate: Lying Oxygen Therapy SpO2: 99 % O2 Device: None (Room air) Pain:  had received meds before session - reports pain is a little better   See FIM for current functional status  Therapy/Group: Individual Therapy  Roney Mans [facility] 12/13/2012, 9:25 AM

## 2012-12-13 NOTE — Progress Notes (Signed)
ANTICOAGULATION CONSULT NOTE - Follow Up Consult  Pharmacy Consult for Coumadin & Lovenox Indication: Afib, PVD  Labs:  [facility] 12/13/12 0705 12/12/12 0550 12/11/12 0415  HGB -- 8.0* 8.3*  HCT -- 25.4* 26.1*  PLT -- 186 181  APTT -- -- --  LABPROT 21.1* 20.0* 17.5*  INR 1.90* 1.77* 1.48  HEPARINUNFRC -- -- --  CREATININE -- 1.90* 1.63*  CKTOTAL -- -- --  CKMB -- -- --  TROPONINI -- -- --   The CrCl is unknown because both a height and weight (above a minimum accepted value) are required for this calculation.  Assessment: 78yo male on Lovenox bridge to therapeutic INR on Coumadin for Afib and PVD. Coumadin restarted 12/10/11 s/p BKA. INR remains sub-therapeutic at 1.9, but trending up nicely. No bleeding reported. H/H and plts are stable, SCr [place] rising, CrCl still > 30 ml/min. Noted updated weight of 103Kg documented.  Reported home regimen is Coumadin 5mg  daily except 2.5mg  Tue/Fri. No bleeding reported in chart.  Goal of Therapy:  INR 2-3 Anti-Xa level 0.6-1.2 units/ml 4hrs after LMWH dose given Monitor platelets by anticoagulation protocol: Yes   Plan:  - Change Lovenox to 100 mg SQ Q12 hours, f/up closely to adjust if renal function worsens. - Coumadin 5mg  x 1 again tonight - Will follow up daily INR, Scr & signs/symptoms of bleeding  Thanks, Farron Watrous K. Allena Katz, PharmD, BCPS.  Clinical Pharmacist Pager [phone]. 12/13/2012 9:18 AM

## 2012-12-13 NOTE — Progress Notes (Signed)
Physical Therapy Session Note  Patient Details  Name: [name] MRN: [id number] Date of Birth: Jan 29, 1935  Today's Date: 12/13/2012 Time: 0930-1000 Time Calculation (min): 30 min  Short Term Goals: Week 1:  PT Short Term Goal 1 (Week 1): Patient will perform wheelchair to mat or bed transfers with min assist. PT Short Term Goal 2 (Week 1): Patient will propel wheelchair 100 feet with bilateral UE's in controlled environment. PT Short Term Goal 3 (Week 1): Patient will sit to stand with +1 mod assist PT Short Term Goal 4 (Week 1): Patient will maintain standing 2 minutes with minimal assistance and rolling walker PT Short Term Goal 5 (Week 1): Patient will participate in dynamic sitting balance activity 5 minutes with supervision edge of mat.  Therapy Documentation Precautions:  Precautions: Fall Weight Bearing Restrictions: No Pain: L BKA 5/10 when moving and 0/10 at rest  Therapeutic Exercise:(15') AROM hip and knee on L LE Wheelchair Management:(15') x 200' on level tile surface with S/Independent assist.  Patient very slow.   Therapy/Group: Individual Therapy  Rex Kras 12/13/2012, 9:46 AM

## 2012-12-14 ENCOUNTER — Inpatient Hospital Stay (HOSPITAL_COMMUNITY): Payer: MEDICARE

## 2012-12-14 ENCOUNTER — Inpatient Hospital Stay (HOSPITAL_COMMUNITY): Payer: MEDICARE | Admitting: *Deleted

## 2012-12-14 LAB — GLUCOSE, CAPILLARY
Glucose-Capillary: 130 mg/dL — ABNORMAL HIGH (ref 70–99)
Glucose-Capillary: 162 mg/dL — ABNORMAL HIGH (ref 70–99)
Glucose-Capillary: 188 mg/dL — ABNORMAL HIGH (ref 70–99)

## 2012-12-14 MED ORDER — BETHANECHOL CHLORIDE 25 MG PO TABS
25.0000 mg | ORAL_TABLET | Freq: Four times a day (QID) | ORAL | Status: DC
  Administered 2012-12-14 – 2012-12-22 (×33): 25 mg via ORAL
  Filled 2012-12-14 (×38): qty 1

## 2012-12-14 MED ORDER — WARFARIN SODIUM 5 MG PO TABS
5.0000 mg | ORAL_TABLET | Freq: Once | ORAL | Status: AC
  Administered 2012-12-14: 5 mg via ORAL
  Filled 2012-12-14: qty 1

## 2012-12-14 MED ORDER — CARVEDILOL 12.5 MG PO TABS
12.5000 mg | ORAL_TABLET | Freq: Two times a day (BID) | ORAL | Status: DC
  Administered 2012-12-15 (×2): 12.5 mg via ORAL
  Filled 2012-12-14 (×6): qty 1

## 2012-12-14 NOTE — Progress Notes (Signed)
Occupational Therapy Session Note  Patient Details  Name: [name] MRN: [id number] Date of Birth: May 19, 1935  Today's Date: 12/14/2012 Time: 1445-1510 Time Calculation (min): 25 min  Short Term Goals: Week 1:  OT Short Term Goal 1 (Week 1): Pt will transfer to the [facility] with supervision. OT Short Term Goal 2 (Week 1): Pt will don pants with supervision. OT Short Term Goal 3 (Week 1): Pt will toilet with supervision.  Skilled Therapeutic Interventions: Therapeutic activities with emphasis on transfers and lower body/upper body strengthening using theraband (medium grade, orange).  Patient attempted squat pivot transfer from w/c to bed but failed due to weak quad and tricep strength.  Patient could not generate sufficient power to rise beyond 8-10 inches.  With OT providing total assist patient achieved sufficient squat height but was unable to sustain position supported by RW.   Patient also rejects use of sliding board d/t pain.   OT demonstrated theraband exercise for quad strengthening (leg press) and for triceps but reinforcement training will be necessary for carry-over due to patient confusion with principles of anchoring band and with skill in performing movements required to provide resistance needed for success with goal of strengthening weak muscles.  OT noted patient was lacking control of left LE during flexion and extension of left hip even without resistance.   Therapy Documentation Precautions:  Precautions Precautions: Fall Restrictions Weight Bearing Restrictions: No  Pain: Pain Assessment Pain Assessment: No/denies pain  See FIM for current functional status  Therapy/Group: Individual Therapy  Georgeanne Nim 12/14/2012, 3:45 PM

## 2012-12-14 NOTE — Progress Notes (Signed)
Physical Therapy Note  Patient Details  Name: CAILEB RHUE MRN: [id number] Date of Birth: 12-26-1934 Today's Date: 12/14/2012  14:00-14:30 (30 min) Individual session Pain reported: 4/10 phantom pain in L foot W/c management:  Propulsion in the room and on the halls, on floor and carpet, maneuvering around obstacles and in/out of the door. Locking and unlocking the breaks. W/C push ups 2 x10 with VC for weight shifts. Therapeutic Activities: Training in supine to sit (minA), sitting EOB (stand by assist), sliding board T/F maxA, Sit to stand with FWW 2x with maxA, patient able to maintain standing position with modA for up to 45s.  Dorna Mai 12/14/2012, 4:46 PM

## 2012-12-14 NOTE — Progress Notes (Signed)
Patient ID: [name], male   DOB: 1935/09/11, 78 y.o.   MRN: [id number] Subjective/Complaints: Pain in L stump and phantom pain No spontaneous voids Review of Systems  Gastrointestinal: Positive for constipation.  Musculoskeletal: Positive for joint pain.  Psychiatric/Behavioral: The patient is nervous/anxious.   All other systems reviewed and are negative.    Objective: Vital Signs: Blood pressure 123/54, pulse 50, temperature 98.1 F (36.7 C), temperature source Oral, resp. rate 20, weight 103.3 kg (227 lb 11.8 oz), SpO2 98.00%. No results found. Results for orders placed during the hospital encounter of 12/11/12 (from the past 72 hour(s))  GLUCOSE, CAPILLARY     Status: Abnormal   Collection Time   12/11/12  9:37 PM      Component Value Range Comment   Glucose-Capillary 137 (*) 70 - 99 mg/dL   CBC WITH DIFFERENTIAL     Status: Abnormal   Collection Time   12/12/12  5:50 AM      Component Value Range Comment   WBC 8.5  4.0 - 10.5 K/uL    RBC 2.88 (*) 4.22 - 5.81 MIL/uL    Hemoglobin 8.0 (*) 13.0 - 17.0 g/dL    HCT 08.6 (*) 57.8 - 52.0 %    MCV 88.2  78.0 - 100.0 fL    MCH 27.8  26.0 - 34.0 pg    MCHC 31.5  30.0 - 36.0 g/dL    RDW 46.9 (*) 62.9 - 15.5 %    Platelets 186  150 - 400 K/uL    Neutrophils Relative 72  43 - 77 %    Neutro Abs 6.1  1.7 - 7.7 K/uL    Lymphocytes Relative 16  12 - 46 %    Lymphs Abs 1.3  0.7 - 4.0 K/uL    Monocytes Relative 9  3 - 12 %    Monocytes Absolute 0.7  0.1 - 1.0 K/uL    Eosinophils Relative 4  0 - 5 %    Eosinophils Absolute 0.3  0.0 - 0.7 K/uL    Basophils Relative 0  0 - 1 %    Basophils Absolute 0.0  0.0 - 0.1 K/uL   COMPREHENSIVE METABOLIC PANEL     Status: Abnormal   Collection Time   12/12/12  5:50 AM      Component Value Range Comment   Sodium 134 (*) 135 - 145 mEq/L    Potassium 5.0  3.5 - 5.1 mEq/L    Chloride 103  96 - 112 mEq/L    CO2 20  19 - 32 mEq/L    Glucose, Bld 151 (*) 70 - 99 mg/dL    BUN 37 (*) 6 - 23  mg/dL    Creatinine, Ser 5.28 (*) 0.50 - 1.35 mg/dL    Calcium 8.9  8.4 - 41.3 mg/dL    Total Protein 5.9 (*) 6.0 - 8.3 g/dL    Albumin 2.2 (*) 3.5 - 5.2 g/dL    AST 13  0 - 37 U/L    ALT <5  0 - 53 U/L    Alkaline Phosphatase 110  39 - 117 U/L    Total Bilirubin 0.5  0.3 - 1.2 mg/dL    GFR calc non Af Amer 33 (*) >90 mL/min    GFR calc Af Amer 38 (*) >90 mL/min   PROTIME-INR     Status: Abnormal   Collection Time   12/12/12  5:50 AM      Component Value Range Comment   Prothrombin  Time 20.0 (*) 11.6 - 15.2 seconds    INR 1.77 (*) 0.00 - 1.49   GLUCOSE, CAPILLARY     Status: Abnormal   Collection Time   12/12/12  7:17 AM      Component Value Range Comment   Glucose-Capillary 141 (*) 70 - 99 mg/dL    Comment 1 Notify RN     GLUCOSE, CAPILLARY     Status: Abnormal   Collection Time   12/12/12 11:13 AM      Component Value Range Comment   Glucose-Capillary 147 (*) 70 - 99 mg/dL    Comment 1 Notify RN     GLUCOSE, CAPILLARY     Status: Abnormal   Collection Time   12/12/12  4:15 PM      Component Value Range Comment   Glucose-Capillary 155 (*) 70 - 99 mg/dL    Comment 1 Notify RN     GLUCOSE, CAPILLARY     Status: Abnormal   Collection Time   12/12/12  9:05 PM      Component Value Range Comment   Glucose-Capillary 135 (*) 70 - 99 mg/dL    Comment 1 Notify RN     PROTIME-INR     Status: Abnormal   Collection Time   12/13/12  7:05 AM      Component Value Range Comment   Prothrombin Time 21.1 (*) 11.6 - 15.2 seconds    INR 1.90 (*) 0.00 - 1.49   GLUCOSE, CAPILLARY     Status: Abnormal   Collection Time   12/13/12  7:20 AM      Component Value Range Comment   Glucose-Capillary 159 (*) 70 - 99 mg/dL    Comment 1 Notify RN     GLUCOSE, CAPILLARY     Status: Abnormal   Collection Time   12/13/12 11:40 AM      Component Value Range Comment   Glucose-Capillary 137 (*) 70 - 99 mg/dL   GLUCOSE, CAPILLARY     Status: Abnormal   Collection Time   12/13/12  4:41 PM      Component  Value Range Comment   Glucose-Capillary 158 (*) 70 - 99 mg/dL   GLUCOSE, CAPILLARY     Status: Abnormal   Collection Time   12/13/12  9:37 PM      Component Value Range Comment   Glucose-Capillary 145 (*) 70 - 99 mg/dL   PROTIME-INR     Status: Abnormal   Collection Time   12/14/12  6:30 AM      Component Value Range Comment   Prothrombin Time 23.8 (*) 11.6 - 15.2 seconds    INR 2.24 (*) 0.00 - 1.49   GLUCOSE, CAPILLARY     Status: Abnormal   Collection Time   12/14/12  7:28 AM      Component Value Range Comment   Glucose-Capillary 130 (*) 70 - 99 mg/dL    Comment 1 Notify RN        HEENT: normal Cardio: RRR Resp: CTA B/L GI: BS positive and Distention Extremity:  Edema Left stump Skin:   Wound C/D/I and no drainage or erythema Neuro: Alert/Oriented, Anxious and Abnormal Sensory reduced sensory R foot Musc/Skel:  Other charcot deformity R ankle Gen NAD   Assessment/Plan: 1. Functional deficits secondary to L BKA which require 3+ hours per day of interdisciplinary therapy in a comprehensive inpatient rehab setting. Physiatrist is providing close team supervision and 24 hour management of active medical problems listed below. Physiatrist and rehab  team continue to assess barriers to discharge/monitor patient progress toward functional and medical goals. FIM: FIM - Bathing Bathing Steps Patient Completed: Chest;Right Arm;Left Arm;Abdomen;Front perineal area;Buttocks;Right upper leg;Left upper leg Bathing: 4: Min-Patient completes 8-9 98f 10 parts or 75+ percent  FIM - Upper Body Dressing/Undressing Upper body dressing/undressing steps patient completed: Thread/unthread right sleeve of pullover shirt/dresss;Thread/unthread left sleeve of pullover shirt/dress;Pull shirt over trunk;Put head through opening of pull over shirt/dress Upper body dressing/undressing: 5: Set-up assist to: Obtain clothing/put away FIM - Lower Body Dressing/Undressing Lower body dressing/undressing steps  patient completed: Thread/unthread right underwear leg Lower body dressing/undressing: 2: Max-Patient completed 25-49% of tasks  FIM - Toileting Toileting steps completed by patient: Adjust clothing prior to toileting;Performs perineal hygiene;Adjust clothing after toileting Toileting: 4: Steadying assist  FIM - Diplomatic Services operational officer Devices: Bedside commode Toilet Transfers: 2-To toilet/BSC: Max A (lift and lower assist);2-From toilet/BSC: Max A (lift and lower assist)  FIM - Bed/Chair Transfer Bed/Chair Transfer: 4: Supine > Sit: Min A (steadying Pt. > 75%/lift 1 leg);3: Bed > Chair or W/C: Mod A (lift or lower assist)  FIM - Locomotion: Wheelchair Distance: 150 feet Locomotion: Wheelchair: 4: Travels 150 ft or more: maneuvers on rugs and over door sillls with minimal assistance (Pt.>75%) FIM - Locomotion: Ambulation Ambulation/Gait Assistance: Not tested (comment) (Attempted in parallel bars; patient unable to lift foot.) Locomotion: Ambulation: 0: Activity did not occur  Comprehension Comprehension Mode: Auditory Comprehension: 5-Understands basic 90% of the time/requires cueing < 10% of the time  Expression Expression Mode: Verbal Expression: 5-Expresses basic 90% of the time/requires cueing < 10% of the time.  Social Interaction Social Interaction: 5-Interacts appropriately 90% of the time - Needs monitoring or encouragement for participation or interaction.  Problem Solving Problem Solving: 4-Solves basic 75 - 89% of the time/requires cueing 10 - 24% of the time  Memory Memory: 4-Recognizes or recalls 75 - 89% of the time/requires cueing 10 - 24% of the time  Medical Problem List and Plan:  1. 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 DVT Prophylaxis/Anticoagulation: Chronic Coumadin therapy for history of PAF/ICD. Continue Lovenox for INR greater than 2.00. Monitor for a bleeding episodes.  3. Pain Management: Percocet,  Robaxin as needed. Monitor with increased mobility  4. Neuropsych: This patient Is capable of making decisions on his/her own behalf.  5. Postoperative anemia. Latest hemoglobin 8.0. Followup CBC. Patient currently asymptomatic  6. Stage II pressure ulcer right heel. Continue Prevalon boot. Followup per wound care nurse  7. Hypertension. Isordil 20 mg 3 times a day, hydralazine 25 mg 3 times a day, Lasix 80 mg daily, coreg 25 mg twice a day.will decrease coreg dose, hold this am due to brady Monitor with increased mobility  8. Chronic systolic congestive heart failure. Continue Lasix 80 mg daily and monitor for any signs of fluid overload  9. Diabetes mellitus with peripheral neuropathy. Latest hemoglobin A1c 8.4. Lantus insulin 12 units twice a day. Patient was taking 10 units Lantus insulin twice daily prior to admission. Check blood sugars a.c. and at bedtime  10. Hypothyroidism. Synthroid  11. Hyperlipidemia. Zocor  12. History of gout. Allopurinol. Monitor for any signs of flare up  13. Chronic renal insufficiency. Baseline creatinine 2.33. Followup chemistries  14. Coronary artery disease with CABG/ICD. No chest pain or shortness of breath. Followup cardiology services as needed  15.  Urinary retention probably multifactorial immobility and diabetic cystopathy +/- BPH, increase urecholine to QID, on flomax  LOS (Days) 3 A FACE TO FACE EVALUATION WAS PERFORMED  Thresea Doble E 12/14/2012, 7:55 AM

## 2012-12-14 NOTE — Progress Notes (Signed)
ANTICOAGULATION CONSULT NOTE - Follow Up Consult  Pharmacy Consult for Coumadin & Lovenox Indication: Afib, PVD  Labs:  [facility] 12/14/12 0630 12/13/12 0705 12/12/12 0550  HGB -- -- 8.0*  HCT -- -- 25.4*  PLT -- -- 186  APTT -- -- --  LABPROT 23.8* 21.1* 20.0*  INR 2.24* 1.90* 1.77*  HEPARINUNFRC -- -- --  CREATININE -- -- 1.90*  CKTOTAL -- -- --  CKMB -- -- --  TROPONINI -- -- --   The CrCl is unknown because both a height and weight (above a minimum accepted value) are required for this calculation.  Assessment: 78yo male on Lovenox bridge to therapeutic INR on Coumadin for Afib and PVD. Coumadin restarted 12/10/11 s/p BKA. INR therapeutic today, full dose lovenox continues. No bleeding reported. H/H and plts are stable, SCr is rising (1.9), CrCl still > 30 ml/min. Noted updated weight of 103Kg documented.  Reported home regimen is Coumadin 5mg  daily except 2.5mg  Tue/Fri.  Goal of Therapy:  INR 2-3 Anti-Xa level 0.6-1.2 units/ml 4hrs after LMWH dose given Monitor platelets by anticoagulation protocol: Yes   Plan:  - Continue Lovenox 100 mg SQ Q12 hours. Will plan to continue Lovenox through tomorrow to complete 48H overlap with therapeutic INR. - Coumadin 5mg  x 1 again tonight - Will follow up daily INR & signs/symptoms of bleeding - CBC and SCr ordered for AM  Thanks, Jamari Diana K. Allena Katz, PharmD, BCPS.  Clinical Pharmacist Pager [phone]. 12/14/2012 7:38 AM

## 2012-12-15 ENCOUNTER — Inpatient Hospital Stay (HOSPITAL_COMMUNITY): Payer: MEDICARE

## 2012-12-15 ENCOUNTER — Inpatient Hospital Stay (HOSPITAL_COMMUNITY): Payer: MEDICARE | Admitting: Occupational Therapy

## 2012-12-15 ENCOUNTER — Inpatient Hospital Stay (HOSPITAL_COMMUNITY): Payer: MEDICARE | Admitting: *Deleted

## 2012-12-15 DIAGNOSIS — L98499 Non-pressure chronic ulcer of skin of other sites with unspecified severity: Secondary | ICD-10-CM

## 2012-12-15 DIAGNOSIS — S88119A Complete traumatic amputation at level between knee and ankle, unspecified lower leg, initial encounter: Secondary | ICD-10-CM

## 2012-12-15 DIAGNOSIS — I739 Peripheral vascular disease, unspecified: Secondary | ICD-10-CM

## 2012-12-15 LAB — GLUCOSE, CAPILLARY
Glucose-Capillary: 165 mg/dL — ABNORMAL HIGH (ref 70–99)
Glucose-Capillary: 152 mg/dL — ABNORMAL HIGH (ref 70–99)
Glucose-Capillary: 122 mg/dL — ABNORMAL HIGH (ref 70–99)

## 2012-12-15 LAB — BASIC METABOLIC PANEL
BUN: 47 mg/dL — ABNORMAL HIGH (ref 6–23)
Calcium: 8.8 mg/dL (ref 8.4–10.5)
Creatinine, Ser: 2 mg/dL — ABNORMAL HIGH (ref 0.50–1.35)
GFR calc Af Amer: 36 mL/min — ABNORMAL LOW (ref >90)
GFR calc non Af Amer: 31 mL/min — ABNORMAL LOW (ref >90)

## 2012-12-15 LAB — CBC
HCT: 25.7 % — ABNORMAL LOW (ref 39.0–52.0)
MCHC: 32.3 g/dL (ref 30.0–36.0)
MCV: 86 fL (ref 78.0–100.0)
Platelets: 175 10*3/uL (ref 150–400)
RDW: 17.7 % — ABNORMAL HIGH (ref 11.5–15.5)

## 2012-12-15 MED ORDER — FUROSEMIDE 40 MG PO TABS
40.0000 mg | ORAL_TABLET | Freq: Every day | ORAL | Status: DC
  Administered 2012-12-15 – 2012-12-24 (×10): 40 mg via ORAL
  Filled 2012-12-15 (×12): qty 1

## 2012-12-15 MED ORDER — WARFARIN SODIUM 5 MG PO TABS
5.0000 mg | ORAL_TABLET | Freq: Once | ORAL | Status: AC
  Administered 2012-12-15: 5 mg via ORAL
  Filled 2012-12-15: qty 1

## 2012-12-15 NOTE — Progress Notes (Signed)
ANTICOAGULATION CONSULT NOTE - Follow Up Consult  Pharmacy Consult for Coumadin & Lovenox Indication: Afib, PVD  Labs:  Basename 12/15/12 0615 12/14/12 0630 12/13/12 0705  HGB 8.3* -- --  HCT 25.7* -- --  PLT 175 -- --  APTT -- -- --  LABPROT 23.8* 23.8* 21.1*  INR 2.24* 2.24* 1.90*  HEPARINUNFRC -- -- --  CREATININE 2.00* -- --  CKTOTAL -- -- --  CKMB -- -- --  TROPONINI -- -- --   The CrCl is unknown because both a height and weight (above a minimum accepted value) are required for this calculation.  Assessment: 78yo male on Lovenox bridge to therapeutic INR on Coumadin for Afib and PVD. Coumadin restarted 12/10/11 s/p BKA. INR therapeutic today, also on full dose lovenox. No bleeding reported. H/H and plts are stable, SCr is rising (2.0), CrCl ~ 30 ml/min.   Reported home regimen is Coumadin 5mg  daily except 2.5mg  Tue/Fri.  Goal of Therapy:  INR 2-3 Anti-Xa level 0.6-1.2 units/ml 4hrs after LMWH dose given Monitor platelets by anticoagulation protocol: Yes   Plan:  - Continue Lovenox 100 mg SQ Q12 hours through today, which will complete 48H overlap with therapeutic INR. - Coumadin 5mg  x 1 again tonight - Will follow up daily INR & signs/symptoms of bleeding  Bayard Hugger, PharmD, BCPS  Clinical Pharmacist  Pager: [phone]  12/15/2012 2:15 PM

## 2012-12-15 NOTE — Progress Notes (Signed)
Occupational Therapy Session Note  Patient Details  Name: [name] MRN: [id number] Date of Birth: 01-09-1935  Today's Date: 12/15/2012 Time: 1000-1100 Time Calculation (min): 60 min  Short Term Goals: Week 1:  OT Short Term Goal 1 (Week 1): Pt will transfer to the [facility] with supervision. OT Short Term Goal 2 (Week 1): Pt will don pants with supervision. OT Short Term Goal 3 (Week 1): Pt will toilet with supervision.  Skilled Therapeutic Interventions/Progress Updates:    Pt seen to address transfer training from w/c to drop arm BSC. Pt able to tranfser back and forth with close supervision, but he scoots with very little hip clearance.  Pt then seen in gym to address UE strength for squat pivot transfers with w/c mobility, UBE at resistance 7 for 12 minutes, tricep extension exercises with free weight, and w/c pushups. Pt was only able to elevate hips slightly.  Encouraged pt to push through LLE and provided mod assist for patient to fully elevate hips.  Therapy Documentation Precautions:  Precautions Precautions: Fall Restrictions Weight Bearing Restrictions: No  Pain: No complaints of pain during 60 min OT session  ADL:  See FIM for current functional status  Therapy/Group: Individual Therapy  Meckenzie Balsley 12/15/2012, 12:02 PM

## 2012-12-15 NOTE — Progress Notes (Signed)
Occupational Therapy Session Note  Patient Details  Name: [name] MRN: [id number] Date of Birth: May 05, 1935  Today's Date: 12/15/2012 Time: 1400-1445 Time Calculation (min): 45 min  Short Term Goals: Week 1:  OT Short Term Goal 1 (Week 1): Pt will transfer to the [facility] with supervision. OT Short Term Goal 2 (Week 1): Pt will don pants with supervision. OT Short Term Goal 3 (Week 1): Pt will toilet with supervision.  Skilled Therapeutic Interventions/Progress Updates:  Patient resting in w/c upon arrival and asking to rest in bed at the end of session.  Engaged in 10 w/c push ups, scoot transfer w/c>bed near foot of the bed then proceeded with scooting to the left toward his pillow which was a difficult task and required 4 rest breaks.  Patient removed right shoe by bending over to untie, loosen and push it off.  Supervision sit>supine then engaged in BUE exercises in supine.  Therapy Documentation Precautions:  Precautions Precautions: Fall Restrictions Weight Bearing Restrictions: No Pain: Denies pain  Therapy/Group: Individual Therapy  Aero Drummonds 12/15/2012, 4:29 PM

## 2012-12-15 NOTE — Progress Notes (Signed)
Physical Therapy Session Note  Patient Details  Name: [name] MRN: [id number] Date of Birth: [birth year]  Today's Date: 12/15/2012 Time: 1305-1350 Time Calculation (min): 45 min  Short Term Goals: Week 1:  PT Short Term Goal 1 (Week 1): Patient will perform wheelchair to mat or bed transfers with min assist. PT Short Term Goal 2 (Week 1): Patient will propel wheelchair 100 feet with bilateral UE's in controlled environment. PT Short Term Goal 3 (Week 1): Patient will sit to stand with +1 mod assist PT Short Term Goal 4 (Week 1): Patient will maintain standing 2 minutes with minimal assistance and rolling walker PT Short Term Goal 5 (Week 1): Patient will participate in dynamic sitting balance activity 5 minutes with supervision edge of mat.  Skilled Therapeutic Interventions/Progress Updates:  Bed mobility training spine laterally and up toward HOB, without use of rails, with VCs for biomechanics, sequencing.  Therapeutic exercise performed with trunk and LEs to increase strength for functional mobility:  In supine - cervical flexion for abdominal activation -bil bridging with bolster under LLE -L knee extension  In sidelying R and L  hip extension, abduction - L quad sets  L sidelying> sit with use of rail, supervision.  Slideboard transfer to R bed> w/c with supervision after set-up, slightly downhill.  Attempted standing in room, in front of bed rail, and next to bed rail.  Pt unable to completely stand with max assist.     Therapy Documentation Precautions:  Precautions Precautions: Fall Restrictions Weight Bearing Restrictions: No   Pain: Pain Assessment Pain Assessment: No/denies pain    See FIM for current functional status  Therapy/Group: Individual Therapy  [name] 12/15/2012, 2:03 PM

## 2012-12-15 NOTE — Progress Notes (Signed)
Patient ID: LENIN KUHNLE, male   DOB: 09/01/1935, 78 y.o.   MRN: [id number] Subjective/Complaints: No pain c/o No spontaneous voids, I/O caths Review of Systems  Gastrointestinal: Positive for constipation.  Musculoskeletal: Positive for joint pain.  Psychiatric/Behavioral: The patient is nervous/anxious.   All other systems reviewed and are negative.    Objective: Vital Signs: Blood pressure 148/69, pulse 49, temperature 98.6 F (37 C), temperature source Oral, resp. rate 18, weight 103.3 kg (227 lb 11.8 oz), SpO2 98.00%. No results found. Results for orders placed during the hospital encounter of 12/11/12 (from the past 72 hour(s))  GLUCOSE, CAPILLARY     Status: Abnormal   Collection Time   12/12/12 11:13 AM      Component Value Range Comment   Glucose-Capillary 147 (*) 70 - 99 mg/dL    Comment 1 Notify RN     GLUCOSE, CAPILLARY     Status: Abnormal   Collection Time   12/12/12  4:15 PM      Component Value Range Comment   Glucose-Capillary 155 (*) 70 - 99 mg/dL    Comment 1 Notify RN     GLUCOSE, CAPILLARY     Status: Abnormal   Collection Time   12/12/12  9:05 PM      Component Value Range Comment   Glucose-Capillary 135 (*) 70 - 99 mg/dL    Comment 1 Notify RN     PROTIME-INR     Status: Abnormal   Collection Time   12/13/12  7:05 AM      Component Value Range Comment   Prothrombin Time 21.1 (*) 11.6 - 15.2 seconds    INR 1.90 (*) 0.00 - 1.49   GLUCOSE, CAPILLARY     Status: Abnormal   Collection Time   12/13/12  7:20 AM      Component Value Range Comment   Glucose-Capillary 159 (*) 70 - 99 mg/dL    Comment 1 Notify RN     GLUCOSE, CAPILLARY     Status: Abnormal   Collection Time   12/13/12 11:40 AM      Component Value Range Comment   Glucose-Capillary 137 (*) 70 - 99 mg/dL   GLUCOSE, CAPILLARY     Status: Abnormal   Collection Time   12/13/12  4:41 PM      Component Value Range Comment   Glucose-Capillary 158 (*) 70 - 99 mg/dL   GLUCOSE, CAPILLARY      Status: Abnormal   Collection Time   12/13/12  9:37 PM      Component Value Range Comment   Glucose-Capillary 145 (*) 70 - 99 mg/dL   PROTIME-INR     Status: Abnormal   Collection Time   12/14/12  6:30 AM      Component Value Range Comment   Prothrombin Time 23.8 (*) 11.6 - 15.2 seconds    INR 2.24 (*) 0.00 - 1.49   GLUCOSE, CAPILLARY     Status: Abnormal   Collection Time   12/14/12  7:28 AM      Component Value Range Comment   Glucose-Capillary 130 (*) 70 - 99 mg/dL    Comment 1 Notify RN     GLUCOSE, CAPILLARY     Status: Abnormal   Collection Time   12/14/12 11:44 AM      Component Value Range Comment   Glucose-Capillary 122 (*) 70 - 99 mg/dL    Comment 1 Notify RN     GLUCOSE, CAPILLARY     Status: Abnormal  Collection Time   12/14/12  4:38 PM      Component Value Range Comment   Glucose-Capillary 162 (*) 70 - 99 mg/dL    Comment 1 Notify RN     GLUCOSE, CAPILLARY     Status: Abnormal   Collection Time   12/14/12  9:30 PM      Component Value Range Comment   Glucose-Capillary 188 (*) 70 - 99 mg/dL   PROTIME-INR     Status: Abnormal   Collection Time   12/15/12  6:15 AM      Component Value Range Comment   Prothrombin Time 23.8 (*) 11.6 - 15.2 seconds    INR 2.24 (*) 0.00 - 1.49   CBC     Status: Abnormal   Collection Time   12/15/12  6:15 AM      Component Value Range Comment   WBC 6.8  4.0 - 10.5 K/uL    RBC 2.99 (*) 4.22 - 5.81 MIL/uL    Hemoglobin 8.3 (*) 13.0 - 17.0 g/dL    HCT 16.1 (*) 09.6 - 52.0 %    MCV 86.0  78.0 - 100.0 fL    MCH 27.8  26.0 - 34.0 pg    MCHC 32.3  30.0 - 36.0 g/dL    RDW 04.5 (*) 40.9 - 15.5 %    Platelets 175  150 - 400 K/uL   BASIC METABOLIC PANEL     Status: Abnormal   Collection Time   12/15/12  6:15 AM      Component Value Range Comment   Sodium 138  135 - 145 mEq/L    Potassium 4.3  3.5 - 5.1 mEq/L    Chloride 103  96 - 112 mEq/L    CO2 21  19 - 32 mEq/L    Glucose, Bld 184 (*) 70 - 99 mg/dL    BUN 47 (*) 6 - 23 mg/dL     Creatinine, Ser 8.11 (*) 0.50 - 1.35 mg/dL    Calcium 8.8  8.4 - 91.4 mg/dL    GFR calc non Af Amer 31 (*) >90 mL/min    GFR calc Af Amer 36 (*) >90 mL/min      HEENT: normal Cardio: RRR Resp: CTA B/L GI: BS positive and Distention Extremity:  Edema Left stump Skin:   Wound C/D/I and no drainage or erythema Neuro: Alert/Oriented, Anxious and Abnormal Sensory reduced sensory R foot Musc/Skel:  Other charcot deformity R ankle Gen NAD   Assessment/Plan: 1. Functional deficits secondary to L BKA which require 3+ hours per day of interdisciplinary therapy in a comprehensive inpatient rehab setting. Physiatrist is providing close team supervision and 24 hour management of active medical problems listed below. Physiatrist and rehab team continue to assess barriers to discharge/monitor patient progress toward functional and medical goals. FIM: FIM - Bathing Bathing Steps Patient Completed: Chest;Right Arm;Left Arm;Abdomen;Front perineal area;Buttocks;Right upper leg;Left upper leg Bathing: 4: Min-Patient completes 8-9 36f 10 parts or 75+ percent  FIM - Upper Body Dressing/Undressing Upper body dressing/undressing steps patient completed: Thread/unthread right sleeve of pullover shirt/dresss;Thread/unthread left sleeve of pullover shirt/dress;Pull shirt over trunk;Put head through opening of pull over shirt/dress Upper body dressing/undressing: 5: Set-up assist to: Obtain clothing/put away FIM - Lower Body Dressing/Undressing Lower body dressing/undressing steps patient completed: Thread/unthread right underwear leg Lower body dressing/undressing: 2: Max-Patient completed 25-49% of tasks  FIM - Toileting Toileting steps completed by patient: Adjust clothing prior to toileting;Performs perineal hygiene;Adjust clothing after toileting Toileting: 1: Total-Patient completed zero  steps, helper did all 3  FIM - Diplomatic Services operational officer Devices: Bedside commode;Sliding  board Toilet Transfers: 3-To toilet/BSC: Mod A (lift or lower assist);3-From toilet/BSC: Mod A (lift or lower assist)  FIM - Banker Devices:  (sliding board) Bed/Chair Transfer: 4: Supine > Sit: Min A (steadying Pt. > 75%/lift 1 leg);2: Bed > Chair or W/C: Max A (lift and lower assist) (using sliding board)  FIM - Locomotion: Wheelchair Distance: 150 feet Locomotion: Wheelchair: 2: Travels 150 ft or more: maneuvers on rugs and over door sills with maximal assistance (Pt: 25 - 49%) FIM - Locomotion: Ambulation Ambulation/Gait Assistance: Not tested (comment) Locomotion: Ambulation: 0: Activity did not occur  Comprehension Comprehension Mode: Auditory Comprehension: 5-Follows basic conversation/direction: With no assist  Expression Expression Mode: Verbal Expression: 5-Expresses basic needs/ideas: With no assist  Social Interaction Social Interaction: 6-Interacts appropriately with others with medication or extra time (anti-anxiety, antidepressant).  Problem Solving Problem Solving: 5-Solves basic 90% of the time/requires cueing < 10% of the time  Memory Memory: 5-Recognizes or recalls 90% of the time/requires cueing < 10% of the time  Medical Problem List and Plan:  1. 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 DVT Prophylaxis/Anticoagulation: Chronic Coumadin therapy for history of PAF/ICD. Continue Lovenox for INR greater than 2.00. Monitor for a bleeding episodes.  3. Pain Management: Percocet, Robaxin as needed. Monitor with increased mobility  4. Neuropsych: This patient Is capable of making decisions on his/her own behalf.  5. Postoperative anemia. Latest hemoglobin stable at 8.3. Followup CBC. Patient currently asymptomatic  6. Stage II pressure ulcer right heel. Continue Prevalon boot. Followup per wound care nurse  7. Hypertension. Isordil 20 mg 3 times a day, hydralazine 25 mg 3 times a day, Lasix 80 mg  daily, coreg 25 mg twice a day.will decrease coreg dose, hold this am due to brady Monitor with increased mobility  8. Chronic systolic congestive heart failure. reduce Lasix 40 mg daily and monitor for any signs of fluid overload  9. Diabetes mellitus with peripheral neuropathy. Latest hemoglobin A1c 8.4. Lantus insulin 12 units twice a day. Patient was taking 10 units Lantus insulin twice daily prior to admission. Check blood sugars a.c. and at bedtime  10. Hypothyroidism. Synthroid  11. Hyperlipidemia. Zocor  12. History of gout. Allopurinol. Monitor for any signs of flare up  13. Chronic renal insufficiency. Baseline creatinine 2.33. Followup chemistries back at baseline but was down in 1-2 range.  Will reduce lasix stop allopurinol, monitor for gout sx 14. Coronary artery disease with CABG/ICD. No chest pain or shortness of breath. Followup cardiology services as needed  15.  Urinary retention probably multifactorial immobility and diabetic cystopathy +/- BPH, increase urecholine to QID, on flomax   LOS (Days) 4 A FACE TO FACE EVALUATION WAS PERFORMED  Donielle Radziewicz E 12/15/2012, 7:28 AM

## 2012-12-15 NOTE — Progress Notes (Signed)
Social Work Patient ID: [name], male   DOB: Aug 24, 1935, 78 y.o.   MRN: [id number] Spoke with daughter who is concerned regarding cost of a ramp at her home-soon to be pt's home. She was asking for resources and have contacted [employer] [facility] to inquire about resources. Spoke with [name] who reports a form to be completed by family and then see if eligible.  Spoken with daughter to get the form and Begin the process. She will speak with [name] from the agency to give information.  Many issues need to be resolved before pt can Return home, ramp is needed, move to daughter's home and may need a caregiver at discharge.

## 2012-12-15 NOTE — Progress Notes (Signed)
Occupational Therapy Session Note  Patient Details  Name: [name] MRN: [id number] Date of Birth: Jan 30, 1935  Today's Date: 12/15/2012 Time:  -   [id number]  ( )    Short Term Goals: Week 1:  OT Short Term Goal 1 (Week 1): Pt will transfer to the [facility] with supervision. OT Short Term Goal 2 (Week 1): Pt will don pants with supervision. OT Short Term Goal 3 (Week 1): Pt will toilet with supervision. Week 2:     Skilled Therapeutic Interventions/Progress Updates:    Interventions:  Therapeutic activties and exercises for Bed mobility, transfers to wc, sit to stand, lateral leaning.  Treatment focused on pt scooting from bed to wc with minimal assist and moderate verbal cues to lift buttocks as he moved.  Pt. Sat in wc at sink for bathing and dressing.  Performed lateral leans for pericare.  Attempted sit to stand at sink but pt was total assist and OT unable to maintain pt safely.  Did wc pushup to don pants.  Pt. Maintained pushup for 5-8 seconds while OT donned pants over hips.  Performed UE AROM with no theraband on Left UE to increase shoulder ROM.  Utilized orange theraband on right UE.  Left pt in wc with call bell in reach.    Therapy Documentation Precautions:  Precautions Precautions: Fall Restrictions Weight Bearing Restrictions: No       Pain: Pain Assessment Pain Assessment: 0-10 Pain Score: 6- Pain Type: Phantom pain;Surgical pain Pain Location: Leg Pain Orientation: Left Pain Radiating Towards: foot Patients Stated Pain Goal: 4 Pain Intervention(s): Medication (See eMAR);Repositioned     See FIM for current functional status  Therapy/Group: Individual Therapy  [name] 12/15/2012, 9:49 AM

## 2012-12-16 ENCOUNTER — Inpatient Hospital Stay (HOSPITAL_COMMUNITY): Payer: MEDICARE | Admitting: Occupational Therapy

## 2012-12-16 ENCOUNTER — Inpatient Hospital Stay (HOSPITAL_COMMUNITY): Payer: MEDICARE

## 2012-12-16 LAB — GLUCOSE, CAPILLARY
Glucose-Capillary: 143 mg/dL — ABNORMAL HIGH (ref 70–99)
Glucose-Capillary: 173 mg/dL — ABNORMAL HIGH (ref 70–99)

## 2012-12-16 LAB — PROTIME-INR: INR: 3.54 — ABNORMAL HIGH (ref 0.00–1.49)

## 2012-12-16 MED ORDER — CARVEDILOL 6.25 MG PO TABS
6.2500 mg | ORAL_TABLET | Freq: Two times a day (BID) | ORAL | Status: DC
  Administered 2012-12-16 – 2012-12-17 (×2): 6.25 mg via ORAL
  Filled 2012-12-16 (×4): qty 1

## 2012-12-16 MED ORDER — GERHARDT'S BUTT CREAM
TOPICAL_CREAM | Freq: Three times a day (TID) | CUTANEOUS | Status: DC
  Administered 2012-12-16 – 2012-12-24 (×22): via TOPICAL
  Filled 2012-12-16: qty 1

## 2012-12-16 MED ORDER — GERHARDT'S BUTT CREAM: TOPICAL_CREAM | CUTANEOUS | Status: DC | PRN

## 2012-12-16 NOTE — Plan of Care (Signed)
Problem: RH BLADDER ELIMINATION Goal: RH STG MANAGE BLADDER WITH ASSISTANCE STG Manage Bladder With Assistance min assist  Outcome: Not Progressing Urinary retention, I&O cath Q8H Goal: RH STG MANAGE BLADDER WITH MEDICATION WITH ASSISTANCE STG Manage Bladder With Medication With Assistance. Min assist  Outcome: Not Progressing Urinary retention, I&O cath Q8H Goal: RH STG MANAGE BLADDER WITH EQUIPMENT WITH ASSISTANCE STG Manage Bladder With Equipment With Assistance min assist  Outcome: Not Progressing Urinary retention, I & O cath Q 8H Goal: RH OTHER STG BLADDER ELIMINATION GOALS W/ASSIST Other STG Bladder Elimination Goals With Assistance min assist  Outcome: Not Progressing Urinary retention, I & O cath [facility]

## 2012-12-16 NOTE — Progress Notes (Signed)
Physical Therapy Note  Patient Details  Name: [name] MRN: [id number] Date of Birth: 1935-04-08 Today's Date: 12/16/2012  8:00 - 8:55 55 minutes Individual session Patient denies pain.  Treatment focused on strengthening exercises for trunk and bilateral lower extremities. Patient in bed upon entering room. Patient performed LE active exercises x 15 reps each of hip flexion, hip adduction, towel squeezes, quad sets and ankle pumps in supine. Patient performed diagonal partial sit ups x 10 each direction to work on trunk. Patient supine to sit going to left side with bed rail and head of bed raised with supervision. Patient performed long arc quads, knee flexion, and hip flexion exercises x 15 reps in sitting. Patient worked on scooting side to side along edge of bed. Patient sat EOB and ate breakfast. Patient left supine in bed with call bell in reach.    Arelia Longest M 12/16/2012, 10:42 AM

## 2012-12-16 NOTE — Progress Notes (Signed)
Occupational Therapy Session Note  Patient Details  Name: [name] MRN: [id number] Date of Birth: May 22, 1935  Today's Date: 12/16/2012 Time: 1005-1100 Time Calculation (min): 55 min  Short Term Goals: Week 1:  OT Short Term Goal 1 (Week 1): Pt will transfer to the [facility] with supervision. OT Short Term Goal 2 (Week 1): Pt will don pants with supervision. OT Short Term Goal 3 (Week 1): Pt will toilet with supervision.  Skilled Therapeutic Interventions/Progress Updates:      Pt seen for BADL retraining of toileting, bathing, and dressing with a focus on patient using lateral leans to cleanse self and manage clothing. Pt transferred bed to [facility] with pad under him.  He did not realize until he finished the transfer that he already started to go to the bathroom.  The arm rest were dropped on both sides of BSC so pt could lean to his right on the bed and to his left onto his w/c seat.  It took patient awhile to complete tasks with several rest breaks, but he was able to laterally lean to cleanse self and don pants over hips.  He needs assist to place pants over right leg as it is difficult for him to reach to floor.  Pt able to complete transfers with min assist although he has great difficulty lifting his hips up fully for a squat pivot. Instead he scoots.  Therapy Documentation Precautions:  Precautions Precautions: Fall Restrictions Weight Bearing Restrictions: No  Pain: Pain Assessment Pain Assessment: No/denies pain ADL:  See FIM for current functional status  Therapy/Group: Individual Therapy  Kashaun Bebo 12/16/2012, 11:21 AM

## 2012-12-16 NOTE — Progress Notes (Signed)
Physical Therapy Note  Patient Details  Name: [name] MRN: [id number] Date of Birth: 1935-12-03 Today's Date: 12/16/2012  11:20 - 12:05 45 minutes Individual session Patient denies pain.  Treatment focused on increasing independence with functional mobility. Patient propelled wheelchair 150 feet on level tile in controlled environment using bilateral UE's with supervision. Patient performed wheelchair set up (swinging away legrests, locking brakes and swinging away armrest) in preparation for transfer with verbal cueing. Patient performed sliding board transfer to and from mat and to and from regular double bed with supervision once sliding board was in place. Worked with patient on placing sliding board - he still needs assist about 50% of the time. Patient supine to sit on regular bed with no assist. Patient stood in parallel bars - max assist for sit to stand with patient pulling on bars and supervision to maintain standing in bars for 2 minutes 30 seconds. Patient attempted to lift up on arms to move right foot side to side on second standing trial. Patient able to lift heel and slide right foot slightly side to side. Patient performed 5 wheelchair pushups with min assist.     Alma Friendly 12/16/2012, 12:13 PM

## 2012-12-16 NOTE — Progress Notes (Signed)
Occupational Therapy Session Note  Patient Details  Name: [name] MRN: [id number] Date of Birth: [birth year]  Today's Date: 12/16/2012 Time: 1400-1430 Time Calculation (min): 30 min  Short Term Goals: Week 1:  OT Short Term Goal 1 (Week 1): Pt will transfer to the [facility] with supervision. OT Short Term Goal 2 (Week 1): Pt will don pants with supervision. OT Short Term Goal 3 (Week 1): Pt will toilet with supervision.  Skilled Therapeutic Interventions/Progress Updates:  Patient seated in w/c stating, "I keep falling asleep" and requested OT to assist patient back to bed at end of this therapy session.  Engaged in sit><squat from w/c level with attempts to stand with 1 UE push on w/c and the other pulling on handle at foot of bed that was raised up.  Patient attempted 6X with minimal success and only able to lift bottom ~1-2" from w/c seat.  Slide board transfer with multiple cues for technique and safety secondary to patient leaning into direction he was traveling to and leaning back which caused his bottom to begin sliding forward on the board.  Practiced lateral leans while EOB to simulate doff/donn pants if he was on the [facility] or if dressing or undressing.  Engaged in BUE Theraband exercises sitting EOB and in supine.  Therapy Documentation Precautions:  Precautions Precautions: Fall Restrictions Weight Bearing Restrictions: No Pain: Pain Assessment Pain Assessment: No/denies pain  Therapy/Group: Individual Therapy  [name] 12/16/2012, 2:35 PM

## 2012-12-16 NOTE — Progress Notes (Signed)
Patient ID: [name], male   DOB: [birth year], 78 y.o.   MRN: [id number] Subjective/Complaints: Both arms sore after therapy yesterday.Good BM yesterday 2 spontaneous voids, I/O caths overnite Review of Systems  Gastrointestinal: Positive for constipation.  Musculoskeletal: Positive for joint pain.  Psychiatric/Behavioral: The patient is nervous/anxious.   All other systems reviewed and are negative.    Objective: Vital Signs: Blood pressure 130/56, pulse 55, temperature 98.3 F (36.8 C), temperature source Oral, resp. rate 20, weight 103.3 kg (227 lb 11.8 oz), SpO2 97.00%. No results found. Results for orders placed during the hospital encounter of 12/11/12 (from the past 72 hour(s))  GLUCOSE, CAPILLARY     Status: Abnormal   Collection Time   12/13/12 11:40 AM      Component Value Range Comment   Glucose-Capillary 137 (*) 70 - 99 mg/dL   GLUCOSE, CAPILLARY     Status: Abnormal   Collection Time   12/13/12  4:41 PM      Component Value Range Comment   Glucose-Capillary 158 (*) 70 - 99 mg/dL   GLUCOSE, CAPILLARY     Status: Abnormal   Collection Time   12/13/12  9:37 PM      Component Value Range Comment   Glucose-Capillary 145 (*) 70 - 99 mg/dL   PROTIME-INR     Status: Abnormal   Collection Time   12/14/12  6:30 AM      Component Value Range Comment   Prothrombin Time 23.8 (*) 11.6 - 15.2 seconds    INR 2.24 (*) 0.00 - 1.49   GLUCOSE, CAPILLARY     Status: Abnormal   Collection Time   12/14/12  7:28 AM      Component Value Range Comment   Glucose-Capillary 130 (*) 70 - 99 mg/dL    Comment 1 Notify RN     GLUCOSE, CAPILLARY     Status: Abnormal   Collection Time   12/14/12 11:44 AM      Component Value Range Comment   Glucose-Capillary 122 (*) 70 - 99 mg/dL    Comment 1 Notify RN     GLUCOSE, CAPILLARY     Status: Abnormal   Collection Time   12/14/12  4:38 PM      Component Value Range Comment   Glucose-Capillary 162 (*) 70 - 99 mg/dL    Comment 1 Notify RN       GLUCOSE, CAPILLARY     Status: Abnormal   Collection Time   12/14/12  9:30 PM      Component Value Range Comment   Glucose-Capillary 188 (*) 70 - 99 mg/dL   PROTIME-INR     Status: Abnormal   Collection Time   12/15/12  6:15 AM      Component Value Range Comment   Prothrombin Time 23.8 (*) 11.6 - 15.2 seconds    INR 2.24 (*) 0.00 - 1.49   CBC     Status: Abnormal   Collection Time   12/15/12  6:15 AM      Component Value Range Comment   WBC 6.8  4.0 - 10.5 K/uL    RBC 2.99 (*) 4.22 - 5.81 MIL/uL    Hemoglobin 8.3 (*) 13.0 - 17.0 g/dL    HCT 40.9 (*) 81.1 - 52.0 %    MCV 86.0  78.0 - 100.0 fL    MCH 27.8  26.0 - 34.0 pg    MCHC 32.3  30.0 - 36.0 g/dL    RDW 91.4 (*) 78.2 -  15.5 %    Platelets 175  150 - 400 K/uL   BASIC METABOLIC PANEL     Status: Abnormal   Collection Time   12/15/12  6:15 AM      Component Value Range Comment   Sodium 138  135 - 145 mEq/L    Potassium 4.3  3.5 - 5.1 mEq/L    Chloride 103  96 - 112 mEq/L    CO2 21  19 - 32 mEq/L    Glucose, Bld 184 (*) 70 - 99 mg/dL    BUN 47 (*) 6 - 23 mg/dL    Creatinine, Ser 2.59 (*) 0.50 - 1.35 mg/dL    Calcium 8.8  8.4 - 56.3 mg/dL    GFR calc non Af Amer 31 (*) >90 mL/min    GFR calc Af Amer 36 (*) >90 mL/min   GLUCOSE, CAPILLARY     Status: Abnormal   Collection Time   12/15/12  7:26 AM      Component Value Range Comment   Glucose-Capillary 165 (*) 70 - 99 mg/dL    Comment 1 Notify RN     GLUCOSE, CAPILLARY     Status: Abnormal   Collection Time   12/15/12 11:28 AM      Component Value Range Comment   Glucose-Capillary 152 (*) 70 - 99 mg/dL    Comment 1 Notify RN     GLUCOSE, CAPILLARY     Status: Abnormal   Collection Time   12/15/12  4:27 PM      Component Value Range Comment   Glucose-Capillary 152 (*) 70 - 99 mg/dL    Comment 1 Notify RN     GLUCOSE, CAPILLARY     Status: Abnormal   Collection Time   12/15/12  9:30 PM      Component Value Range Comment   Glucose-Capillary 122 (*) 70 - 99 mg/dL    PROTIME-INR     Status: Abnormal   Collection Time   12/16/12  6:40 AM      Component Value Range Comment   Prothrombin Time 33.4 (*) 11.6 - 15.2 seconds    INR 3.54 (*) 0.00 - 1.49   GLUCOSE, CAPILLARY     Status: Abnormal   Collection Time   12/16/12  7:24 AM      Component Value Range Comment   Glucose-Capillary 143 (*) 70 - 99 mg/dL    Comment 1 Notify RN        HEENT: normal Cardio: RRR Resp: CTA B/L GI: BS positive and Distention Extremity:  Edema Left stump Skin:   Wound C/D/I and no drainage or erythema Neuro: Alert/Oriented, Anxious and Abnormal Sensory reduced sensory R foot Musc/Skel:  Other charcot deformity R ankle Gen NAD   Assessment/Plan: 1. Functional deficits secondary to L BKA which require 3+ hours per day of interdisciplinary therapy in a comprehensive inpatient rehab setting. Physiatrist is providing close team supervision and 24 hour management of active medical problems listed below. Physiatrist and rehab team continue to assess barriers to discharge/monitor patient progress toward functional and medical goals. FIM: FIM - Bathing Bathing Steps Patient Completed: Chest;Right Arm;Left Arm;Abdomen;Front perineal area;Buttocks;Right upper leg;Left upper leg Bathing: 4: Min-Patient completes 8-9 64f 10 parts or 75+ percent  FIM - Upper Body Dressing/Undressing Upper body dressing/undressing steps patient completed: Thread/unthread right sleeve of pullover shirt/dresss;Thread/unthread left sleeve of pullover shirt/dress;Pull shirt over trunk;Put head through opening of pull over shirt/dress Upper body dressing/undressing: 5: Set-up assist to: Obtain clothing/put away FIM -  Lower Body Dressing/Undressing Lower body dressing/undressing steps patient completed: Thread/unthread right underwear leg Lower body dressing/undressing: 1: Total-Patient completed less than 25% of tasks  FIM - Toileting Toileting steps completed by patient: Adjust clothing prior to  toileting;Performs perineal hygiene;Adjust clothing after toileting Toileting: 1: Total-Patient completed zero steps, helper did all 3  FIM - Diplomatic Services operational officer Devices: Human resources officer Transfers: 3-To toilet/BSC: Mod A (lift or lower assist);3-From toilet/BSC: Mod A (lift or lower assist)  FIM - Banker Devices: Sliding board;Arm rests Bed/Chair Transfer: 5: Bed > Chair or W/C: Supervision (verbal cues/safety issues)  FIM - Locomotion: Wheelchair Distance: 150 feet Locomotion: Wheelchair: 0: Activity did not occur FIM - Locomotion: Ambulation Ambulation/Gait Assistance: Not tested (comment) Locomotion: Ambulation: 0: Activity did not occur  Comprehension Comprehension Mode: Auditory Comprehension: 5-Understands basic 90% of the time/requires cueing < 10% of the time  Expression Expression Mode: Verbal Expression: 5-Expresses basic 90% of the time/requires cueing < 10% of the time.  Social Interaction Social Interaction Mode: Asleep Social Interaction: 6-Interacts appropriately with others with medication or extra time (anti-anxiety, antidepressant).  Problem Solving Problem Solving: 5-Solves basic 90% of the time/requires cueing < 10% of the time  Memory Memory: 5-Requires cues to use assistive device  Medical Problem List and Plan:  1. 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 DVT Prophylaxis/Anticoagulation: Chronic Coumadin therapy for history of PAF/ICD. Continue Lovenox for INR greater than 2.00. Monitor for a bleeding episodes.  3. Pain Management: Percocet, Robaxin as needed. Monitor with increased mobility  4. Neuropsych: This patient Is capable of making decisions on his/her own behalf.  5. Postoperative anemia. Latest hemoglobin stable at 8.3. Followup CBC. Patient currently asymptomatic  6. Stage II pressure ulcer right heel. Continue Prevalon boot.  Followup per wound care nurse  7. Hypertension. Isordil 20 mg 3 times a day, hydralazine 25 mg 3 times a day, Lasix 80 mg daily, coreg 25 mg twice a day.will decrease coreg dose, hold this am due to brady Monitor with increased mobility  8. Chronic systolic congestive heart failure. reduce Lasix 40 mg daily and monitor for any signs of fluid overload  9. Diabetes mellitus with peripheral neuropathy. Latest hemoglobin A1c 8.4. Lantus insulin 12 units twice a day. Patient was taking 10 units Lantus insulin twice daily prior to admission. Check blood sugars a.c. and at bedtime  10. Hypothyroidism. Synthroid  11. Hyperlipidemia. Zocor  12. History of gout. Allopurinol. Monitor for any signs of flare up  13. Chronic renal insufficiency. Baseline creatinine 2.33. Followup chemistries back at baseline but was down in 1-2 range.  Will reduce lasix stop allopurinol, monitor for gout sx 14. Coronary artery disease with CABG/ICD. No chest pain or shortness of breath. Followup cardiology services as needed  15.  Urinary retention probably multifactorial immobility and diabetic cystopathy +/- BPH, increase urecholine to QID, on flomax   LOS (Days) 5 A FACE TO FACE EVALUATION WAS PERFORMED  KIRSTEINS,ANDREW E 12/16/2012, 7:56 AM

## 2012-12-16 NOTE — Progress Notes (Signed)
ANTICOAGULATION CONSULT NOTE - Follow Up Consult  Pharmacy Consult for Coumadin Indication: Afib, PVD  Labs:  Basename 12/16/12 0640 12/15/12 0615 12/14/12 0630  HGB -- 8.3* --  HCT -- 25.7* --  PLT -- 175 --  APTT -- -- --  LABPROT 33.4* 23.8* 23.8*  INR 3.54* 2.24* 2.24*  HEPARINUNFRC -- -- --  CREATININE -- 2.00* --  CKTOTAL -- -- --  CKMB -- -- --  TROPONINI -- -- --   Estimated Creatinine Clearance: 40.9 ml/min (by C-G formula based on Cr of 2).  Assessment: 78yo male on Coumadin for Afib and PVD. Coumadin restarted 12/10/11 s/p BKA. INR therapeutic yesterday and lovenox was dced after 5 doses.  INR up to 3.54 today, no new meds that interact with coumadin have been recently started, and no bleeding has been reported. Scr=2.0 and Crcl~34ml/min.    Reported home regimen is Coumadin 5mg  daily except 2.5mg  Tue/Fri.  Goal of Therapy:  INR 2-3 Monitor platelets by anticoagulation protocol: Yes   Plan:  Hold Coumadin tonight.  Will follow up daily INR & signs/symptoms of bleeding  Wendie Simmer, PharmD, BCPS Clinical Pharmacist  Pager: [phone]

## 2012-12-16 NOTE — Consult Note (Signed)
WOC follow up  Wound type: Stage II pressure ulcer-HAPU, PAD related area- medial lower extremity Pressure Ulcer POA: Yes Measurement: 2.5cmx 3.5cmx 0 Wound ZOX:WRUEAV, not open Drainage (amount, consistency, odor) none Periwound:intact Dressing procedure/placement/frequency: continue Prevalon boot for offloading, and silicone foam dressing changed every 3 days and PRN soilage Area in the medial pretibial is improved, no open areas currently, induration is improved.   Re consult if needed, will not follow at this time. Thanks  Bren Steers [employer], CWOCN [phone])

## 2012-12-17 ENCOUNTER — Inpatient Hospital Stay (HOSPITAL_COMMUNITY): Payer: Medicare Other

## 2012-12-17 ENCOUNTER — Inpatient Hospital Stay (HOSPITAL_COMMUNITY): Payer: MEDICARE | Admitting: Occupational Therapy

## 2012-12-17 ENCOUNTER — Inpatient Hospital Stay (HOSPITAL_COMMUNITY): Payer: MEDICARE

## 2012-12-17 LAB — CBC WITH DIFFERENTIAL/PLATELET
Eosinophils Absolute: 0.3 10*3/uL (ref 0.0–0.7)
HCT: 28.7 % — ABNORMAL LOW (ref 39.0–52.0)
Hemoglobin: 9 g/dL — ABNORMAL LOW (ref 13.0–17.0)
Lymphs Abs: 1.4 10*3/uL (ref 0.7–4.0)
MCH: 27.5 pg (ref 26.0–34.0)
Monocytes Relative: 7 % (ref 3–12)
Neutrophils Relative %: 74 % (ref 43–77)
RBC: 3.27 MIL/uL — ABNORMAL LOW (ref 4.22–5.81)

## 2012-12-17 LAB — BASIC METABOLIC PANEL
BUN: 55 mg/dL — ABNORMAL HIGH (ref 6–23)
Chloride: 103 mEq/L (ref 96–112)
GFR calc non Af Amer: 31 mL/min — ABNORMAL LOW (ref >90)
Glucose, Bld: 206 mg/dL — ABNORMAL HIGH (ref 70–99)
Potassium: 4 mEq/L (ref 3.5–5.1)

## 2012-12-17 LAB — PROTIME-INR: INR: 4.2 — ABNORMAL HIGH (ref 0.00–1.49)

## 2012-12-17 LAB — GLUCOSE, CAPILLARY: Glucose-Capillary: 207 mg/dL — ABNORMAL HIGH (ref 70–99)

## 2012-12-17 MED ORDER — CARVEDILOL 3.125 MG PO TABS
3.1250 mg | ORAL_TABLET | Freq: Two times a day (BID) | ORAL | Status: DC
  Administered 2012-12-17 – 2012-12-18 (×3): 3.125 mg via ORAL
  Filled 2012-12-17 (×6): qty 1

## 2012-12-17 MED ORDER — GUAIFENESIN-DM 100-10 MG/5ML PO SYRP
10.0000 mL | ORAL_SOLUTION | ORAL | Status: DC | PRN
  Administered 2012-12-17 (×2): 10 mL via ORAL
  Filled 2012-12-17 (×2): qty 10

## 2012-12-17 NOTE — Progress Notes (Signed)
Occupational Therapy Session Note  Patient Details  Name: [name] MRN: [id number] Date of Birth: 1935/04/03  Today's Date: 12/17/2012 Time: 11:04am-11:45am Time calculation: 41 min.  Short Term Goals: Week 1:  OT Short Term Goal 1 (Week 1): Pt will transfer to the [facility] with supervision. OT Short Term Goal 2 (Week 1): Pt will don pants with supervision. OT Short Term Goal 3 (Week 1): Pt will toilet with supervision.  Skilled Therapeutic Interventions/Progress Updates:    Pt sitting in wheelchair upon arrival. Bilateral UE strengthening completed to increase functional mobility/transfers and ease with ADL's, see "exercises" below.  Therapy Documentation Precautions:  Precautions Precautions: Fall Restrictions Weight Bearing Restrictions: No General:  Vital Signs: Therapy Vitals BP: 117/63 mmHg Pain: Pt reports pain of 5/10 in LLE. Pt had pain medication earlier this morning.   Exercises: Pt completed the following to increase strength in bil UE's: orange theraband row, sh flexion, bicep curls x10, 2 sets (with theraband tied in a loop to assist with arthritic hands.) With red weighted ball, sh flexion with BUE's x10, 3 sets reaching 100 degrees flexion.  With 2# weight, bicep curls x10, 3 sets.      See FIM for current functional status  Therapy/Group: Individual Therapy  Jillaine Waren Hessie Diener 12/17/2012, 11:30 AM

## 2012-12-17 NOTE — Progress Notes (Addendum)
Physical Therapy Session Note  Patient Details  Name: [name] MRN: [id number] Date of Birth: 10/03/1935  Today's Date: 12/17/2012 Time: 1005-1030 and 4098-1191 Time Calculation (min): 25 min and 58 min  Short Term Goals: Week 1:  PT Short Term Goal 1 (Week 1): Patient will perform wheelchair to mat or bed transfers with min assist. PT Short Term Goal 2 (Week 1): Patient will propel wheelchair 100 feet with bilateral UE's in controlled environment. PT Short Term Goal 3 (Week 1): Patient will sit to stand with +1 mod assist PT Short Term Goal 4 (Week 1): Patient will maintain standing 2 minutes with minimal assistance and rolling walker PT Short Term Goal 5 (Week 1): Patient will participate in dynamic sitting balance activity 5 minutes with supervision edge of mat.  Skilled Therapeutic Interventions/Progress Updates:   AM- Pt supine in bed with HOB elevated, attempting to urinate into urinal.  No results after several minutes.    Pt stated he had gas, but when diaper was checked, he was soiled.  Rolling L and R  in bed with HOB at 15 degrees, use of rail, supervision.  Pt transferred bed > bedside commode to R scoot across witout SB, min assist, +2 for safety.  Therapist notified NT and OT of pt's diarrhea.  PM-  W/c mobility using bil UEs  X 120', with occasional min assist to stay on R side of hall.  VCs for brakes, max assist for legrest manipulation.  Therapeutic exercise performed with bil LEs to increase strength for functional mobility: resisted L knee flexion/extenstion, bil resisted hip adduction/abduction, R long arc knee extension with 3# wt on ankle, all 1 x 10, R calf raises with 3# wt on ankle., 2 x 10 .  W/c> mat scoot across transfer to R, supervision.  Mat> w/c to L with SB, supervision after set up.  Pt requires mod assist for placement of board.  Scooting R with poor elevation of hip, supervision, to L with better elevation.  With mat elevated, pt  performed wt shift forward x 5 with minimal elevation of hips.  Pt reports long-standing UE weakness, in addition to decreased sensation bil hands. W/c mobility x 100' through obstacle course, with supervision, without cues.    Pt agreeable to staying up in w/c at end of tx, in room, with call bell and phone within reach.     Therapy Documentation  Precautions:  Precautions Precautions: Fall Restrictions Weight Bearing Restrictions: No  Vitals: BP 110/48; HR 50 O2 sat = 99% on room air  Pain: Pain Assessment Pain Assessment: No/denies pain    See FIM for current functional status  Therapy/Group: Individual Therapy  Adilenne Ashworth 12/17/2012, 10:30 AM

## 2012-12-17 NOTE — Progress Notes (Signed)
Occupational Therapy Session Note  Patient Details  Name: DACEN FRAYRE MRN: [id number] Date of Birth: Jun 30, 1935  Today's Date: 12/17/2012 Time: 1000-1100 Time Calculation (min): 60 min  Short Term Goals: Week 1:  OT Short Term Goal 1 (Week 1): Pt will transfer to the [facility] with supervision. OT Short Term Goal 2 (Week 1): Pt will don pants with supervision. OT Short Term Goal 3 (Week 1): Pt will toilet with supervision.  Skilled Therapeutic Interventions/Progress Updates:      Pt seen for BADL retraining of toileting, bathing, and dressing with a focus on lateral leaning on BSC with bed on one side and w/c on the other.  Pt used reacher to don pants over right foot with min assist and he then pulled over hips with close supervision.  Scooted self to w/c with supervision and assist to hold w/c steady.  Demonstrated tub bench transfers to patient but he was too fatigued to try it today.  Therapy Documentation Precautions:  Precautions Precautions: Fall Restrictions Weight Bearing Restrictions: No  Pain: Pain Assessment Pain Assessment: No/denies pain ADL:  See FIM for current functional status  Therapy/Group: Individual Therapy  Jaidan Stachnik 12/17/2012, 11:58 AM

## 2012-12-17 NOTE — Progress Notes (Signed)
ANTICOAGULATION CONSULT NOTE - Follow Up Consult  Pharmacy Consult for coumadin Indication: atrial fibrillation and PVD  Allergies  Allergen Reactions  . Iodine Solution (Povidone Iodine) Rash    Topical only    Patient Measurements: Height: 6' 2.02" (188 cm) Weight: 227 lb 11.8 oz (103.3 kg) IBW/kg (Calculated) : 82.24  Heparin Dosing Weight:   Vital Signs: Temp: 97.9 F (36.6 C) (01/15 0605) Temp src: Oral (01/15 0605) BP: 117/63 mmHg (01/15 0749) Pulse Rate: 51  (01/15 0605)  Labs:  Basename 12/17/12 0545 12/16/12 0640 12/15/12 0615  HGB -- -- 8.3*  HCT -- -- 25.7*  PLT -- -- 175  APTT -- -- --  LABPROT 37.9* 33.4* 23.8*  INR 4.20* 3.54* 2.24*  HEPARINUNFRC -- -- --  CREATININE -- -- 2.00*  CKTOTAL -- -- --  CKMB -- -- --  TROPONINI -- -- --    Estimated Creatinine Clearance: 40.9 ml/min (by C-G formula based on Cr of 2).   Medications:  Scheduled:    . bethanechol  25 mg Oral QID  . carvedilol  3.125 mg Oral BID WC  . feeding supplement  237 mL Oral BID BM  . feeding supplement  30 mL Oral BID WC  . furosemide  40 mg Oral Daily  . gabapentin  100 mg Oral TID  . Gerhardt's butt cream   Topical TID  . hydrALAZINE  25 mg Oral TID  . insulin aspart  0-15 Units Subcutaneous TID WC  . insulin glargine  12 Units Subcutaneous BID  . isosorbide dinitrate  20 mg Oral TID  . levothyroxine  50 mcg Oral QAC breakfast  . pantoprazole  40 mg Oral Daily  . polyethylene glycol  17 g Oral Daily  . senna-docusate  2 tablet Oral BID  . simvastatin  40 mg Oral QHS  . Tamsulosin HCl  0.4 mg Oral QPC supper  . Warfarin - Pharmacist Dosing Inpatient   Does not apply q1800  . [DISCONTINUED] carvedilol  6.25 mg Oral BID WC   Infusions:    Assessment: 78 yo male with afib and PVD is currently on supratherapeutic coumadin.  INR up to 4.2 today.  No new meds started that interact with coumadin. Goal of Therapy:  INR 2-3    Plan:  1) No coumadin tonight 2) INR in  am  Alexei Ey, [name] 12/17/2012,9:28 AM

## 2012-12-17 NOTE — Progress Notes (Signed)
Patient ID: [name], male   DOB: 1935/11/24, 78 y.o.   MRN: [id number] Subjective/Complaints: Still requiring I/O caths Occ cough at noc non productive No signs of bleeding Review of Systems  Gastrointestinal: Positive for constipation.  Musculoskeletal: Positive for joint pain.  Psychiatric/Behavioral: The patient is nervous/anxious.   All other systems reviewed and are negative.    Objective: Vital Signs: Blood pressure 117/63, pulse 51, temperature 97.9 F (36.6 C), temperature source Oral, resp. rate 20, height 6' 2.02" (1.88 m), weight 103.3 kg (227 lb 11.8 oz), SpO2 98.00%. No results found. Results for orders placed during the hospital encounter of 12/11/12 (from the past 72 hour(s))  GLUCOSE, CAPILLARY     Status: Abnormal   Collection Time   12/14/12 11:44 AM      Component Value Range Comment   Glucose-Capillary 122 (*) 70 - 99 mg/dL    Comment 1 Notify RN     GLUCOSE, CAPILLARY     Status: Abnormal   Collection Time   12/14/12  4:38 PM      Component Value Range Comment   Glucose-Capillary 162 (*) 70 - 99 mg/dL    Comment 1 Notify RN     GLUCOSE, CAPILLARY     Status: Abnormal   Collection Time   12/14/12  9:30 PM      Component Value Range Comment   Glucose-Capillary 188 (*) 70 - 99 mg/dL   PROTIME-INR     Status: Abnormal   Collection Time   12/15/12  6:15 AM      Component Value Range Comment   Prothrombin Time 23.8 (*) 11.6 - 15.2 seconds    INR 2.24 (*) 0.00 - 1.49   CBC     Status: Abnormal   Collection Time   12/15/12  6:15 AM      Component Value Range Comment   WBC 6.8  4.0 - 10.5 K/uL    RBC 2.99 (*) 4.22 - 5.81 MIL/uL    Hemoglobin 8.3 (*) 13.0 - 17.0 g/dL    HCT 11.9 (*) 14.7 - 52.0 %    MCV 86.0  78.0 - 100.0 fL    MCH 27.8  26.0 - 34.0 pg    MCHC 32.3  30.0 - 36.0 g/dL    RDW 82.9 (*) 56.2 - 15.5 %    Platelets 175  150 - 400 K/uL   BASIC METABOLIC PANEL     Status: Abnormal   Collection Time   12/15/12  6:15 AM      Component Value  Range Comment   Sodium 138  135 - 145 mEq/L    Potassium 4.3  3.5 - 5.1 mEq/L    Chloride 103  96 - 112 mEq/L    CO2 21  19 - 32 mEq/L    Glucose, Bld 184 (*) 70 - 99 mg/dL    BUN 47 (*) 6 - 23 mg/dL    Creatinine, Ser 1.30 (*) 0.50 - 1.35 mg/dL    Calcium 8.8  8.4 - 86.5 mg/dL    GFR calc non Af Amer 31 (*) >90 mL/min    GFR calc Af Amer 36 (*) >90 mL/min   GLUCOSE, CAPILLARY     Status: Abnormal   Collection Time   12/15/12  7:26 AM      Component Value Range Comment   Glucose-Capillary 165 (*) 70 - 99 mg/dL    Comment 1 Notify RN     GLUCOSE, CAPILLARY     Status: Abnormal  Collection Time   12/15/12 11:28 AM      Component Value Range Comment   Glucose-Capillary 152 (*) 70 - 99 mg/dL    Comment 1 Notify RN     GLUCOSE, CAPILLARY     Status: Abnormal   Collection Time   12/15/12  4:27 PM      Component Value Range Comment   Glucose-Capillary 152 (*) 70 - 99 mg/dL    Comment 1 Notify RN     GLUCOSE, CAPILLARY     Status: Abnormal   Collection Time   12/15/12  9:30 PM      Component Value Range Comment   Glucose-Capillary 122 (*) 70 - 99 mg/dL   PROTIME-INR     Status: Abnormal   Collection Time   12/16/12  6:40 AM      Component Value Range Comment   Prothrombin Time 33.4 (*) 11.6 - 15.2 seconds    INR 3.54 (*) 0.00 - 1.49   GLUCOSE, CAPILLARY     Status: Abnormal   Collection Time   12/16/12  7:24 AM      Component Value Range Comment   Glucose-Capillary 143 (*) 70 - 99 mg/dL    Comment 1 Notify RN     GLUCOSE, CAPILLARY     Status: Abnormal   Collection Time   12/16/12 12:36 PM      Component Value Range Comment   Glucose-Capillary 173 (*) 70 - 99 mg/dL   GLUCOSE, CAPILLARY     Status: Abnormal   Collection Time   12/16/12  4:42 PM      Component Value Range Comment   Glucose-Capillary 224 (*) 70 - 99 mg/dL    Comment 1 Notify RN     GLUCOSE, CAPILLARY     Status: Abnormal   Collection Time   12/16/12  8:57 PM      Component Value Range Comment    Glucose-Capillary 171 (*) 70 - 99 mg/dL   PROTIME-INR     Status: Abnormal   Collection Time   12/17/12  5:45 AM      Component Value Range Comment   Prothrombin Time 37.9 (*) 11.6 - 15.2 seconds    INR 4.20 (*) 0.00 - 1.49      HEENT: normal Cardio: RRR Resp: CTA B/L GI: BS positive and Distention Extremity:  Edema Left stump Skin:   Wound C/D/I and no drainage or erythema Neuro: Alert/Oriented, Anxious and Abnormal Sensory reduced sensory R foot Musc/Skel:  Other charcot deformity R ankle Gen NAD   Assessment/Plan: 1. Functional deficits secondary to L BKA which require 3+ hours per day of interdisciplinary therapy in a comprehensive inpatient rehab setting. Physiatrist is providing close team supervision and 24 hour management of active medical problems listed below. Physiatrist and rehab team continue to assess barriers to discharge/monitor patient progress toward functional and medical goals. FIM: FIM - Bathing Bathing Steps Patient Completed: Chest;Right Arm;Left Arm;Abdomen;Front perineal area;Buttocks;Left upper leg;Right upper leg Bathing: 4: Min-Patient completes 8-9 81f 10 parts or 75+ percent  FIM - Upper Body Dressing/Undressing Upper body dressing/undressing steps patient completed: Thread/unthread right sleeve of pullover shirt/dresss;Thread/unthread left sleeve of pullover shirt/dress;Pull shirt over trunk;Put head through opening of pull over shirt/dress Upper body dressing/undressing: 5: Set-up assist to: Obtain clothing/put away FIM - Lower Body Dressing/Undressing Lower body dressing/undressing steps patient completed: Thread/unthread left underwear leg;Pull underwear up/down;Thread/unthread left pants leg;Pull pants up/down Lower body dressing/undressing: 3: Mod-Patient completed 50-74% of tasks  FIM - Toileting Toileting steps  completed by patient: Adjust clothing prior to toileting;Adjust clothing after toileting;Performs perineal hygiene Toileting: 5:  Supervision: Safety issues/verbal cues  FIM - Diplomatic Services operational officer Devices: Bedside commode Toilet Transfers: 4-To toilet/BSC: Min A (steadying Pt. > 75%);4-From toilet/BSC: Min A (steadying Pt. > 75%)  FIM - Bed/Chair Transfer Bed/Chair Transfer Assistive Devices: Bed rails;HOB elevated Bed/Chair Transfer: 6: Supine > Sit: No assist;6: Sit > Supine: No assist;5: Bed > Chair or W/C: Supervision (verbal cues/safety issues);5: Chair or W/C > Bed: Supervision (verbal cues/safety issues)  FIM - Locomotion: Wheelchair Distance: 150 feet Locomotion: Wheelchair: 5: Travels 150 ft or more: maneuvers on rugs and over door sills with supervision, cueing or coaxing FIM - Locomotion: Ambulation Ambulation/Gait Assistance: Not tested (comment) Locomotion: Ambulation: 0: Activity did not occur  Comprehension Comprehension Mode: Auditory Comprehension: 5-Understands basic 90% of the time/requires cueing < 10% of the time  Expression Expression Mode: Verbal Expression: 5-Expresses complex 90% of the time/cues < 10% of the time  Social Interaction Social Interaction Mode: Asleep Social Interaction: 6-Interacts appropriately with others with medication or extra time (anti-anxiety, antidepressant).  Problem Solving Problem Solving: 5-Solves complex 90% of the time/cues < 10% of the time  Memory Memory: 5-Recognizes or recalls 90% of the time/requires cueing < 10% of the time  Medical Problem List and Plan:  1. 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 DVT Prophylaxis/Anticoagulation: Chronic Coumadin therapy for history of PAF/ICD. Continue Lovenox for INR greater than 2.00. Monitor for a bleeding episodes.  3. Pain Management: Percocet, Robaxin as needed. Monitor with increased mobility  4. Neuropsych: This patient Is capable of making decisions on his/her own behalf.  5. Postoperative anemia. Latest hemoglobin stable at 8.3. Followup CBC. Patient  currently asymptomatic  6. Stage II pressure ulcer right heel. Continue Prevalon boot. Followup per wound care nurse  7. Hypertension. Isordil 20 mg 3 times a day, hydralazine 25 mg 3 times a day, Lasix 80 mg daily, coreg 25 mg twice a day.will decrease coreg dose, hold this am due to brady Monitor with increased mobility  8. Chronic systolic congestive heart failure. reduce Lasix 40 mg daily and monitor for any signs of fluid overload  9. Diabetes mellitus with peripheral neuropathy. Latest hemoglobin A1c 8.4. Lantus insulin 12 units twice a day. Patient was taking 10 units Lantus insulin twice daily prior to admission. Check blood sugars a.c. and at bedtime  10. Hypothyroidism. Synthroid  11. Hyperlipidemia. Zocor  12. History of gout. Allopurinol. Monitor for any signs of flare up  13. Chronic renal insufficiency. Baseline creatinine 2.33. Followup chemistries back at baseline but was down in 1-2 range.  Will reduce lasix stop allopurinol, monitor for gout sx 14. Coronary artery disease with CABG/ICD. No chest pain or shortness of breath. Followup cardiology services as needed  15.  Urinary retention probably multifactorial immobility and diabetic cystopathy +/- BPH, increase urecholine to QID, on flomax 16.  Cough at noc suspect GERD, cont PPI, Elevate HOB  LOS (Days) 6 A FACE TO FACE EVALUATION WAS PERFORMED  Dorisann Schwanke E 12/17/2012, 7:54 AM

## 2012-12-18 ENCOUNTER — Inpatient Hospital Stay (HOSPITAL_COMMUNITY): Payer: MEDICARE | Admitting: Occupational Therapy

## 2012-12-18 ENCOUNTER — Inpatient Hospital Stay (HOSPITAL_COMMUNITY): Payer: MEDICARE | Admitting: *Deleted

## 2012-12-18 ENCOUNTER — Inpatient Hospital Stay (HOSPITAL_COMMUNITY): Payer: Medicare Other

## 2012-12-18 DIAGNOSIS — I739 Peripheral vascular disease, unspecified: Secondary | ICD-10-CM

## 2012-12-18 DIAGNOSIS — S88119A Complete traumatic amputation at level between knee and ankle, unspecified lower leg, initial encounter: Secondary | ICD-10-CM

## 2012-12-18 DIAGNOSIS — L98499 Non-pressure chronic ulcer of skin of other sites with unspecified severity: Secondary | ICD-10-CM

## 2012-12-18 LAB — GLUCOSE, CAPILLARY
Glucose-Capillary: 196 mg/dL — ABNORMAL HIGH (ref 70–99)
Glucose-Capillary: 208 mg/dL — ABNORMAL HIGH (ref 70–99)
Glucose-Capillary: 229 mg/dL — ABNORMAL HIGH (ref 70–99)

## 2012-12-18 LAB — PROTIME-INR
INR: 2.48 — ABNORMAL HIGH (ref 0.00–1.49)
Prothrombin Time: 25.7 seconds — ABNORMAL HIGH (ref 11.6–15.2)

## 2012-12-18 MED ORDER — WARFARIN SODIUM 2.5 MG PO TABS
2.5000 mg | ORAL_TABLET | Freq: Once | ORAL | Status: AC
  Administered 2012-12-18: 2.5 mg via ORAL
  Filled 2012-12-18: qty 1

## 2012-12-18 NOTE — Progress Notes (Signed)
ANTICOAGULATION CONSULT NOTE - Follow Up Consult  Pharmacy Consult for coumadin Indication: afib an dPVD s/p BKA  Allergies  Allergen Reactions  . Iodine Solution (Povidone Iodine) Rash    Topical only    Patient Measurements: Height: 6' 2.02" (188 cm) Weight: 214 lb 4.6 oz (97.2 kg) IBW/kg (Calculated) : 82.24  Heparin Dosing Weight:   Vital Signs: Temp: 98.3 F (36.8 C) (01/16 0547) Temp src: Oral (01/16 0547) BP: 139/51 mmHg (01/16 0547) Pulse Rate: 50  (01/16 0547)  Labs:  Basename 12/18/12 0655 12/17/12 1042 12/17/12 0545 12/16/12 0640  HGB -- 9.0* -- --  HCT -- 28.7* -- --  PLT -- 213 -- --  APTT -- -- -- --  LABPROT 25.7* -- 37.9* 33.4*  INR 2.48* -- 4.20* 3.54*  HEPARINUNFRC -- -- -- --  CREATININE -- 2.02* -- --  CKTOTAL -- -- -- --  CKMB -- -- -- --  TROPONINI -- -- -- --    Estimated Creatinine Clearance: 36.7 ml/min (by C-G formula based on Cr of 2.02).   Medications:  Scheduled:    . bethanechol  25 mg Oral QID  . carvedilol  3.125 mg Oral BID WC  . feeding supplement  237 mL Oral BID BM  . feeding supplement  30 mL Oral BID WC  . furosemide  40 mg Oral Daily  . gabapentin  100 mg Oral TID  . Gerhardt's butt cream   Topical TID  . hydrALAZINE  25 mg Oral TID  . insulin aspart  0-15 Units Subcutaneous TID WC  . insulin glargine  12 Units Subcutaneous BID  . isosorbide dinitrate  20 mg Oral TID  . levothyroxine  50 mcg Oral QAC breakfast  . pantoprazole  40 mg Oral Daily  . polyethylene glycol  17 g Oral Daily  . senna-docusate  2 tablet Oral BID  . simvastatin  40 mg Oral QHS  . Tamsulosin HCl  0.4 mg Oral QPC supper  . warfarin  2.5 mg Oral ONCE-1800  . Warfarin - Pharmacist Dosing Inpatient   Does not apply q1800   Infusions:    Assessment: 78 yo male with hx of afib and PVD s/p BKA is currently on therapeutic coumadin.  INR is down to 2.48 after holding few doses for supratherapeutic levels these past few days.    Goal of Therapy:    INR 2-3    Plan:  1) Coumadin 2.5mg  po x1 2) INR in am  Jens Siems, [name] 12/18/2012,11:17 AM

## 2012-12-18 NOTE — Progress Notes (Signed)
Occupational Therapy Session Note  Patient Details  Name: [name] MRN: [id number] Date of Birth: [birth year]  Today's Date: 12/18/2012 Time: 1005-1130 Time Calculation (min): 85 min  Short Term Goals: Week 1:  OT Short Term Goal 1 (Week 1): Pt will transfer to the [facility] with supervision. OT Short Term Goal 2 (Week 1): Pt will don pants with supervision. OT Short Term Goal 3 (Week 1): Pt will toilet with supervision.  Skilled Therapeutic Interventions/Progress Updates:    Pt seen this am for BADL retraining of B/D at w/c level as pt was already in w/c.  Pt has just completed a PT session in which he stood 1x with the walker.  We tried to integrate this skill into the ADL session by having him stand at sink, but patient refused stating he had never stood.  When reminded he had just done that he said it was with the walker.  When the walker was placed in front of pt, he attempted to come into stand but was unable to achieve this even with max assist.  Pt instead worked on lateral leans to pull pants up over hips. Pt worked on UE strengthening with w/c mobility, UBE at resistance 5 for 10 min, transfers to mat, pushup blocks.  Pt was unable to elevate hips by himself but could with max assist.  Pt transferred  Back to w/c with close supervision.  Therapy Documentation Precautions:  Precautions Precautions: Fall Restrictions Weight Bearing Restrictions: No   Pain: Pain Assessment Pain Assessment: No/denies pain ADL:  See FIM for current functional status  Therapy/Group: Individual Therapy  SAGUIER,JULIA 12/18/2012, 11:47 AM

## 2012-12-18 NOTE — Progress Notes (Signed)
Physical Therapy Weekly Progress Note  Patient Details  Name: [name] MRN: [id number] Date of Birth: 06/05/1935  Today's Date: 12/18/2012 Time: 1303-1400 Time Calculation (min): 57 min Patient has met 4 of 5 short term goals.  Pt has slowly improved in strength necessary for standing.  His hand weakness due to RA limits his progress.  Patient continues to demonstrate the following deficits:strength, balance, activity tolerance, acute pain LLE and therefore will continue to benefit from skilled PT intervention to enhance overall performance with activity tolerance, balance and functional use of  right lower extremity.  Patient progressing toward long term goals..  Continue plan of care. Pt 's B and B problems may dictate his d/c environment.    PT Short Term Goals Week 1:  PT Short Term Goal 1 (Week 1): Patient will perform wheelchair to mat or bed transfers with min assist. PT Short Term Goal 1 - Progress (Week 1): Met PT Short Term Goal 2 (Week 1): Patient will propel wheelchair 100 feet with bilateral UE's in controlled environment. PT Short Term Goal 2 - Progress (Week 1): Met PT Short Term Goal 3 (Week 1): Patient will sit to stand with +1 mod assist PT Short Term Goal 3 - Progress (Week 1): Met PT Short Term Goal 4 (Week 1): Patient will maintain standing 2 minutes with minimal assistance and rolling walker PT Short Term Goal 4 - Progress (Week 1): Met PT Short Term Goal 5 (Week 1): Patient will participate in dynamic sitting balance activity 5 minutes with supervision edge of mat. PT Short Term Goal 5 - Progress (Week 1): Not met  Week 2:  PT Short Term Goal 1 (Week 2): LTGs  Skilled Therapeutic Interventions/Progress Updates:   Therapeutic exercise performed with LE to increase strength for functional mobility: L knee extension with isometric contraction at end range, L hip abduction/extension, R hip abduction/adduction, straight leg raises for trunk activation, ankle  pumps, bil shoulder protraction, bil bridging, rolling L and R to activate trunk.  Pt dyspneic 2/4 performing therex.  L sidelying to sit with supervision, very slowly, but without VCs for technique;  From 27" high bed, pt stood (with hands starting on RW) x  2 minutes, min guard assist.  Pt stood again and performed 10 x 1 RLE mini squats, and 5 x 1 movements of R foot to initiate pivoting, using RW, min guard assist.  With second person guarding, pt transferred bed> w/c to R stand/pivot with RW, min assist, min VCs.  Wooden insert placed in w/c cushion, w/c switched to deeper one for better support, comfort and function.  Pt manipulated brakes with 1 VC, and practiced managing bil legrests x 2 with mod VCs, tactile cues.     Therapy Documentation Precautions:  Precautions Precautions: Fall Restrictions Weight Bearing Restrictions: No  Pain: Pain Assessment Pain Score:  No pain at beginning of session; 6/10 L residual limb by end of session; medication      See FIM for current functional status  Therapy/Group: Individual Therapy  Julia Alkhatib 12/18/2012, 4:54 PM

## 2012-12-18 NOTE — Patient Care Conference (Signed)
Inpatient RehabilitationTeam Conference and Plan of Care Update Date: 12/17/2012   Time: 11:45 Am    Patient Name: [name]      Medical Record Number: [id number]  Date of Birth: 1935-10-14 Sex: Male         Room/Bed: 4028/4028-01 Payor Info: Payor: MEDICARE  Plan: MEDICARE PART A AND B  Product Type: *No Product type*     Admitting Diagnosis: lt bka  Admit Date/Time:  12/11/2012  5:48 PM Admission Comments: No comment available   Primary Diagnosis:  S/P unilateral BKA (below knee amputation) Principal Problem: S/P unilateral BKA (below knee amputation)  Patient Active Problem List   Diagnosis Date Noted  . S/P unilateral BKA (below knee amputation) 12/11/2012  . S/P angioplasty, 11/27/12 -Lt below the knee popliteal artery 11/28/2012  . Sepsis 07/20/2012  . Ulcer of left heel 07/20/2012  . Chest pain 07/20/2012  . Hyponatremia 07/20/2012  . Arrhythmia, ventricular 07/19/2012  . Anemia, Hgb 11- 9.0 with hydration 06/15/2012  . ICD discharge in setting of K+ 3.0 06/11/2012  . Acute on chronic renal failure, SCr 2.3-3.3 secondary to dehydration 06/11/2012  . Chronic systolic CHF (congestive heart failure) 06/11/2012  . Gout 05/28/2012  . Hypothyroidism 05/28/2012  . Diabetic foot ulcer, S/P Lt BKA 12/08/12 05/27/2012  . Sleep apnea, C-Pap intol 11/08/2011  . Ischemic cardiomyopathy, EF 25% 2D 12/12 11/07/2011  . PVD, Lt popliteal HSRA 7/13 11/07/2011  . Dyslipidemia 11/07/2011  . Chronic anticoagulation 11/07/2011  . Non-sustained ventricular tachycardia 11/07/2011  . Hypokalemia 11/06/2011  . Hx of CABG x 6 2003 11/05/2011  . ICD in place, MDT May 2011 11/05/2011  . PAF (paroxysmal atrial fibrillation) 11/05/2011  . Diabetes mellitus type 2, insulin dependent 11/05/2011  . CKD (chronic kidney disease) stage 4, GFR 15-29 ml/min 11/05/2011  . Positive Coombs test 11/05/2011  . Anemia due to chronic illness 11/05/2011  . HTN (hypertension) 11/05/2011    Expected  Discharge Date: Expected Discharge Date: 12/25/12  Team Members Present: Physician leading conference: Dr. Claudette Laws Social Worker Present: Dossie Der, LCSW Nurse Present: Other (comment) Victorino Dike Studer-RN) PT Present: Edman Circle, PT;Becky Henrene Dodge, PT;Other (comment) Clarisse Gouge ripa-PT) OT Present: Bretta Bang, Verlene Mayer, OT SLP Present: Fae Pippin, SLP Other (Discipline and Name): Vernona Rieger Jobe-Dietician     Current Status/Progress Goal Weekly Team Focus  Medical   pain controlled, urinary retention  improve bladder emptying  adjust meds   Bowel/Bladder     Cont bowel, I & O cath working on voiding   cont B & B   emptying  bladder  Swallow/Nutrition/ Hydration             ADL's   mod assist LB dressing, close supervision toileting on wide BSC, min bathing, min transfers - scooting  mod I from w/c level  UE strength, activity tolerance, ADL retraining   Mobility   supervision bed mobility and sliding board transfers to level surfaces.  mod I wheelchair level   wheelchair set up for transfer; transfers to different levels   Communication             Safety/Cognition/ Behavioral Observations            Pain             Skin                *See Interdisciplinary Assessment and Plan and progress notes for long and short-term goals  Barriers to Discharge: Severe neuropathy affecting RLE and  Hands    Possible Resolutions to Barriers:  will likely need 24/7 sup    Discharge Planning/Teaching Needs:  Plan to move into to daughter's home-daughter to switch homes.  Needs a ramp to be accessible, [facility]  Daughter doesnt's seem to understand pt may require 24hr sup for safety.  This can not be provided, may need to pursue NHP      Team Discussion:  Voiding issues continue. Making slow progress, barriers weakness in hands and good leg.  Will only reach w/c level-accessability is an issue into his home. Does not have 24 hr care  Revisions to  Treatment Plan:  Possible NHP will speak with daughter regarding the progress she has made regarding ramp and moving   Continued Need for Acute Rehabilitation Level of Care: The patient requires daily medical management by a physician with specialized training in physical medicine and rehabilitation for the following conditions: Daily direction of a multidisciplinary physical rehabilitation program to ensure safe treatment while eliciting the highest outcome that is of practical value to the patient.: Yes Daily medical management of patient stability for increased activity during participation in an intensive rehabilitation regime.: Yes Daily analysis of laboratory values and/or radiology reports with any subsequent need for medication adjustment of medical intervention for : Post surgical problems;Neurological problems  Lucy Chris 12/19/2012, 9:13 AM

## 2012-12-18 NOTE — Progress Notes (Signed)
Physical Therapy Session Note  Patient Details  Name: [name] MRN: [id number] Date of Birth: Feb 11, 1935  Today's Date: 12/18/2012 Time: 13:03-14:00 ( )   Skilled Therapeutic Interventions/Progress Updates:  Pt participated in group tx for LE strengthening and ROM.  Staff pushed WC for time sake to gym, but propelled x50' with S for return. Sliding board transfer WC<>mat with S and assist for board placement x1  Therex including all of the following 2x10:   Seated LAQ with 5 sec hold.  Supine therex: R ankle pumps, L quad sets, SAQ, SLR Sidelying: L hip ABD with cues for technique and manual assist for form.   Supine<>sit with S, no rails and increased effort through sidelying Unsupported seated ball toss, challenging to twist and throw/catch in/outside BOS with close S only. Push-ups on handles 2x10 with cues for technique.      Therapy Documentation Precautions:  Precautions Precautions: Fall Restrictions Weight Bearing Restrictions: No    Pain: None  See FIM for current functional status  Therapy/Group: Group Therapy  Clydene Laming, PT, DPT  12/18/2012, 1:35 PM

## 2012-12-18 NOTE — Plan of Care (Signed)
Problem: RH BLADDER ELIMINATION Goal: RH STG MANAGE BLADDER WITH MEDICATION WITH ASSISTANCE STG Manage Bladder With Medication With Assistance. Min assist  Outcome: Not Progressing Patient unable to void. A. Hernando Reali,LPN

## 2012-12-18 NOTE — Progress Notes (Signed)
INITIAL NUTRITION ASSESSMENT  DOCUMENTATION CODES Per approved criteria  -Not Applicable   INTERVENTION: Continue Glucerna bid Continue prostat bid Continue CHO MOD diet.  NUTRITION DIAGNOSIS: Increased nutrient needs related to wound/size as evidenced by estimated needs.  Goal: Intake of meals and supplements to meet >/=90% estimated needs.  Monitor:  Intake, supplement tolerance, labs, weight.  Reason for Assessment: LOS  78 y.o. male  Admitting Dx: S/P unilateral BKA (below knee amputation)  ASSESSMENT: Patient eating lunch.  States he does not have an appetite.  Trying to eat anyway.  Intake 25-100% meals.  No concentrated sweet diet prior to admit.  Likes Glucerna and is drinking this.  Continue supplements to assist with wound healing.  Height: Ht Readings from Last 1 Encounters:  12/16/12 6' 2.02" (1.88 m)    Weight: Wt Readings from Last 1 Encounters:  12/17/12 214 lb 4.6 oz (97.2 kg)    Ideal Body Weight: 179 lbs  % Ideal Body Weight: 120  Wt Readings from Last 10 Encounters:  12/17/12 214 lb 4.6 oz (97.2 kg)  11/29/12 212 lb 8.4 oz (96.4 kg)  11/29/12 212 lb 8.4 oz (96.4 kg)  11/29/12 212 lb 8.4 oz (96.4 kg)  07/23/12 200 lb 14.4 oz (91.128 kg)  07/15/12 210 lb (95.255 kg)  06/18/12 216 lb 11.4 oz (98.3 kg)  06/18/12 216 lb 11.4 oz (98.3 kg)  06/18/12 216 lb 11.4 oz (98.3 kg)  05/27/12 238 lb (107.956 kg)    Usual Body Weight: 211# pre amputation per patient    Adjusted for loss of limb =198 lbs % Usual Body Weight: 108  BMI:  29.3  Estimated Nutritional Needs: Kcal: 2200-2400 Protein: 110-120 gm  Fluid: 2.2L  Skin: wound C/D/I and no drainage or erythema  Diet Order: Carb Control  EDUCATION NEEDS: -No education needs identified at this time   Intake/Output Summary (Last 24 hours) at 12/18/12 1431 Last data filed at 12/18/12 1300  Gross per 24 hour  Intake    600 ml  Output    625 ml  Net    -25 ml    Labs:   Lab 12/17/12  1042 12/15/12 0615 12/12/12 0550  NA 137 138 134*  K 4.0 4.3 5.0  CL 103 103 103  CO2 21 21 20   BUN 55* 47* 37*  CREATININE 2.02* 2.00* 1.90*  CALCIUM 9.0 8.8 8.9  MG -- -- --  PHOS -- -- --  GLUCOSE 206* 184* 151*    CBG (last 3)   Basename 12/18/12 1134 12/18/12 0720 12/17/12 2059  GLUCAP 213* 156* 196*   Lab Results  Component Value Date   HGBA1C 8.4* 11/24/2012   HGBA1C 8.2* 06/11/2012   HGBA1C 8.2* 05/29/2012   Lab Results  Component Value Date   LDLCALC  Value: 50        Total Cholesterol/HDL:CHD Risk Coronary Heart Disease Risk Table                     Men   Women  1/2 Average Risk   3.4   3.3 11/07/2007   CREATININE 2.02* 12/17/2012     Scheduled Meds:   . bethanechol  25 mg Oral QID  . carvedilol  3.125 mg Oral BID WC  . feeding supplement  237 mL Oral BID BM  . feeding supplement  30 mL Oral BID WC  . furosemide  40 mg Oral Daily  . gabapentin  100 mg Oral TID  . Gerhardt's butt cream  Topical TID  . hydrALAZINE  25 mg Oral TID  . insulin aspart  0-15 Units Subcutaneous TID WC  . insulin glargine  12 Units Subcutaneous BID  . isosorbide dinitrate  20 mg Oral TID  . levothyroxine  50 mcg Oral QAC breakfast  . pantoprazole  40 mg Oral Daily  . polyethylene glycol  17 g Oral Daily  . senna-docusate  2 tablet Oral BID  . simvastatin  40 mg Oral QHS  . Tamsulosin HCl  0.4 mg Oral QPC supper  . warfarin  2.5 mg Oral ONCE-1800  . Warfarin - Pharmacist Dosing Inpatient   Does not apply q1800    Continuous Infusions:   Past Medical History  Diagnosis Date  . HTN (hypertension)   . PVD (peripheral vascular disease)   . COPD (chronic obstructive pulmonary disease)   . Diabetes mellitus     IDDM  . S/P CABG x 6 2003    Dr Cornelius Moras, Myoview low risk 2009  . Gout   . Paroxysmal atrial fibrillation   . Hypothyroidism   . ICD (implantable cardiac defibrillator) discharge 5/11    MDT  . Chronic renal insufficiency, stage III (moderate)   . Chronic systolic  CHF (congestive heart failure)     EF 25-30% echo 12/12  . S/P angioplasty, 11/27/12 -Lt below the knee popliteal artery 11/28/2012    Past Surgical History  Procedure Date  . Coronary artery bypass graft   . Cardiac catheterization   . Amputation 12/08/2012    Procedure: AMPUTATION BELOW KNEE;  Surgeon: Eldred Manges, MD;  Location: [facility] OR;  Service: Orthopedics;  Laterality: Left;    Oran Rein, RD, LDN Clinical Inpatient Dietitian Pager:  [phone] Weekend and after hours pager:  [phone]

## 2012-12-18 NOTE — Progress Notes (Signed)
Patient ID: [name], male   DOB: [birth year], 78 y.o.   MRN: [id number] Subjective/Complaints: Still requiring I/O caths Occ cough at noc non productive No signs of bleeding Review of Systems  Gastrointestinal: Positive for constipation.  Musculoskeletal: Positive for joint pain.  Psychiatric/Behavioral: The patient is nervous/anxious.   All other systems reviewed and are negative.    Objective: Vital Signs: Blood pressure 139/51, pulse 50, temperature 98.3 F (36.8 C), temperature source Oral, resp. rate 20, height 6' 2.02" (1.88 m), weight 97.2 kg (214 lb 4.6 oz), SpO2 100.00%. No results found. Results for orders placed during the hospital encounter of 12/11/12 (from the past 72 hour(s))  GLUCOSE, CAPILLARY     Status: Abnormal   Collection Time   12/15/12  7:26 AM      Component Value Range Comment   Glucose-Capillary 165 (*) 70 - 99 mg/dL    Comment 1 Notify RN     GLUCOSE, CAPILLARY     Status: Abnormal   Collection Time   12/15/12 11:28 AM      Component Value Range Comment   Glucose-Capillary 152 (*) 70 - 99 mg/dL    Comment 1 Notify RN     GLUCOSE, CAPILLARY     Status: Abnormal   Collection Time   12/15/12  4:27 PM      Component Value Range Comment   Glucose-Capillary 152 (*) 70 - 99 mg/dL    Comment 1 Notify RN     GLUCOSE, CAPILLARY     Status: Abnormal   Collection Time   12/15/12  9:30 PM      Component Value Range Comment   Glucose-Capillary 122 (*) 70 - 99 mg/dL   PROTIME-INR     Status: Abnormal   Collection Time   12/16/12  6:40 AM      Component Value Range Comment   Prothrombin Time 33.4 (*) 11.6 - 15.2 seconds    INR 3.54 (*) 0.00 - 1.49   GLUCOSE, CAPILLARY     Status: Abnormal   Collection Time   12/16/12  7:24 AM      Component Value Range Comment   Glucose-Capillary 143 (*) 70 - 99 mg/dL    Comment 1 Notify RN     GLUCOSE, CAPILLARY     Status: Abnormal   Collection Time   12/16/12 12:36 PM      Component Value Range Comment   Glucose-Capillary 173 (*) 70 - 99 mg/dL   GLUCOSE, CAPILLARY     Status: Abnormal   Collection Time   12/16/12  4:42 PM      Component Value Range Comment   Glucose-Capillary 224 (*) 70 - 99 mg/dL    Comment 1 Notify RN     GLUCOSE, CAPILLARY     Status: Abnormal   Collection Time   12/16/12  8:57 PM      Component Value Range Comment   Glucose-Capillary 171 (*) 70 - 99 mg/dL   PROTIME-INR     Status: Abnormal   Collection Time   12/17/12  5:45 AM      Component Value Range Comment   Prothrombin Time 37.9 (*) 11.6 - 15.2 seconds    INR 4.20 (*) 0.00 - 1.49   GLUCOSE, CAPILLARY     Status: Abnormal   Collection Time   12/17/12  7:35 AM      Component Value Range Comment   Glucose-Capillary 174 (*) 70 - 99 mg/dL   CBC WITH DIFFERENTIAL  Status: Abnormal   Collection Time   12/17/12 10:42 AM      Component Value Range Comment   WBC 9.0  4.0 - 10.5 K/uL    RBC 3.27 (*) 4.22 - 5.81 MIL/uL    Hemoglobin 9.0 (*) 13.0 - 17.0 g/dL    HCT 84.6 (*) 96.2 - 52.0 %    MCV 87.8  78.0 - 100.0 fL    MCH 27.5  26.0 - 34.0 pg    MCHC 31.4  30.0 - 36.0 g/dL    RDW 95.2 (*) 84.1 - 15.5 %    Platelets 213  150 - 400 K/uL    Neutrophils Relative 74  43 - 77 %    Neutro Abs 6.6  1.7 - 7.7 K/uL    Lymphocytes Relative 16  12 - 46 %    Lymphs Abs 1.4  0.7 - 4.0 K/uL    Monocytes Relative 7  3 - 12 %    Monocytes Absolute 0.6  0.1 - 1.0 K/uL    Eosinophils Relative 3  0 - 5 %    Eosinophils Absolute 0.3  0.0 - 0.7 K/uL    Basophils Relative 0  0 - 1 %    Basophils Absolute 0.0  0.0 - 0.1 K/uL   BASIC METABOLIC PANEL     Status: Abnormal   Collection Time   12/17/12 10:42 AM      Component Value Range Comment   Sodium 137  135 - 145 mEq/L    Potassium 4.0  3.5 - 5.1 mEq/L    Chloride 103  96 - 112 mEq/L    CO2 21  19 - 32 mEq/L    Glucose, Bld 206 (*) 70 - 99 mg/dL    BUN 55 (*) 6 - 23 mg/dL    Creatinine, Ser 3.24 (*) 0.50 - 1.35 mg/dL    Calcium 9.0  8.4 - 40.1 mg/dL    GFR calc non Af  Amer 31 (*) >90 mL/min    GFR calc Af Amer 35 (*) >90 mL/min   GLUCOSE, CAPILLARY     Status: Abnormal   Collection Time   12/17/12 11:24 AM      Component Value Range Comment   Glucose-Capillary 207 (*) 70 - 99 mg/dL   GLUCOSE, CAPILLARY     Status: Abnormal   Collection Time   12/17/12  4:23 PM      Component Value Range Comment   Glucose-Capillary 207 (*) 70 - 99 mg/dL    Comment 1 Notify RN     GLUCOSE, CAPILLARY     Status: Abnormal   Collection Time   12/17/12  8:59 PM      Component Value Range Comment   Glucose-Capillary 196 (*) 70 - 99 mg/dL      HEENT: normal Cardio: RRR Resp: CTA B/L GI: BS positive and Distention Extremity:  Edema Left stump Skin:   Wound C/D/I and no drainage or erythema Neuro: Alert/Oriented, Anxious and Abnormal Sensory reduced sensory R foot Musc/Skel:  Other charcot deformity R ankle Gen NAD Mood/affect appropriate  Assessment/Plan: 1. Functional deficits secondary to L BKA which require 3+ hours per day of interdisciplinary therapy in a comprehensive inpatient rehab setting. Physiatrist is providing close team supervision and 24 hour management of active medical problems listed below. Physiatrist and rehab team continue to assess barriers to discharge/monitor patient progress toward functional and medical goals. FIM: FIM - Bathing Bathing Steps Patient Completed: Chest;Right Arm;Left Arm;Abdomen;Front perineal area;Buttocks;Left upper leg;Right  upper leg Bathing: 4: Min-Patient completes 8-9 2f 10 parts or 75+ percent  FIM - Upper Body Dressing/Undressing Upper body dressing/undressing steps patient completed: Thread/unthread right sleeve of pullover shirt/dresss;Thread/unthread left sleeve of pullover shirt/dress;Pull shirt over trunk;Put head through opening of pull over shirt/dress Upper body dressing/undressing: 5: Set-up assist to: Obtain clothing/put away FIM - Lower Body Dressing/Undressing Lower body dressing/undressing steps patient  completed: Thread/unthread left underwear leg;Pull underwear up/down;Thread/unthread left pants leg;Pull pants up/down Lower body dressing/undressing: 3: Mod-Patient completed 50-74% of tasks  FIM - Toileting Toileting steps completed by patient: Adjust clothing prior to toileting;Adjust clothing after toileting;Performs perineal hygiene Toileting: 5: Supervision: Safety issues/verbal cues  FIM - Diplomatic Services operational officer Devices: Bedside commode Toilet Transfers: 1-Two helpers  FIM - Architectural technologist Transfer: 5: Chair or W/C > Bed: Supervision (verbal cues/safety issues)  FIM - Locomotion: Wheelchair Distance: 150 feet Locomotion: Wheelchair: 2: Travels 50 - 149 ft with minimal assistance (Pt.>75%) FIM - Locomotion: Ambulation Ambulation/Gait Assistance: Not tested (comment) Locomotion: Ambulation: 0: Activity did not occur  Comprehension Comprehension Mode: Auditory Comprehension: 5-Understands basic 90% of the time/requires cueing < 10% of the time  Expression Expression Mode: Verbal Expression: 5-Expresses complex 90% of the time/cues < 10% of the time  Social Interaction Social Interaction Mode: Asleep Social Interaction: 6-Interacts appropriately with others with medication or extra time (anti-anxiety, antidepressant).  Problem Solving Problem Solving: 5-Solves complex 90% of the time/cues < 10% of the time  Memory Memory: 5-Recognizes or recalls 90% of the time/requires cueing < 10% of the time  Medical Problem List and Plan:  1. 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 DVT Prophylaxis/Anticoagulation: Chronic Coumadin therapy for history of PAF/ICD. Continue Lovenox for INR greater than 2.00. Monitor for a bleeding episodes.  3. Pain Management: Percocet, Robaxin as needed. Monitor with increased mobility  4. Neuropsych: This patient Is capable of making  decisions on his/her own behalf.  5. Postoperative anemia. Latest hemoglobin stable at 8.3. Followup CBC. Patient currently asymptomatic  6. Stage II pressure ulcer right heel. Continue Prevalon boot. Followup per wound care nurse  7. Hypertension. Isordil 20 mg 3 times a day, hydralazine 25 mg 3 times a day, Lasix 80 mg daily, coreg 25 mg twice a day.will decrease coreg dose, hold this am due to brady Monitor with increased mobility  8. Chronic systolic congestive heart failure. reduce Lasix 40 mg daily and monitor for any signs of fluid overload  9. Diabetes mellitus with peripheral neuropathy. Latest hemoglobin A1c 8.4. Lantus insulin will adjust Patient was taking 10 units Lantus insulin twice daily prior to admission. Check blood sugars a.c. and at bedtime  10. Hypothyroidism. Synthroid  11. Hyperlipidemia. Zocor  12. History of gout. Allopurinol. Monitor for any signs of flare up  13. Chronic renal insufficiency. Baseline creatinine 2.33. Followup chemistries back at baseline but was down in 1-2 range.  Will reduce lasix stop allopurinol, monitor for gout sx 14. Coronary artery disease with CABG/ICD. No chest pain or shortness of breath. Followup cardiology services as needed  15.  Urinary retention probably multifactorial immobility and diabetic cystopathy +/- BPH, increase urecholine to QID, on flomax 16.  Cough at noc suspect GERD, cont PPI, Elevate HOB  LOS (Days) 7 A FACE TO FACE EVALUATION WAS PERFORMED  Jaylanie Boschee E 12/18/2012, 7:21 AM

## 2012-12-18 NOTE — Progress Notes (Signed)
Inpatient Diabetes Program Recommendations  AACE/ADA: New Consensus Statement on Inpatient Glycemic Control (2013)  Target Ranges:  Prepandial:   less than 140 mg/dL      Peak postprandial:   less than 180 mg/dL (1-2 hours)      Critically ill patients:  140 - 180 mg/dL   Inpatient Diabetes Program Recommendations Insulin - Basal: Consider increasing Lantus to 13 units BID Insulin - Meal Coverage: Consider adding Novolog 3 units with meals  Thank you  Piedad Climes BSN, RN,CDE Inpatient Diabetes Coordinator [phone] (team pager)

## 2012-12-18 NOTE — Progress Notes (Signed)
Social Work Patient ID: [name], male   DOB: 09-07-1935, 78 y.o.   MRN: [id number] Met with pt and spoke with daughter and granddaughter via telephone to discuss team conference goals-mod/i w/c level and discharge 1/23. Daughter doesn't feel they can move and a ramp can be placed by that time.  She thought he would be here longer.  Discussed it would be good to have someone checking  Frequently on pt.  Both daughter and granddaughter work long hours.  Discussed options of short term NHP or hired assistance.  Pt needs to continue his rehab and Allow time for daughter to get all that she needs to get done for him to return home.  All feel best option is short term NHP.  Will begin looking at facilities in [place]. Pt is agreeable and understands all that has to take place for him to go home.  Daughter to get back with worker regarding facilities in [place].

## 2012-12-19 ENCOUNTER — Inpatient Hospital Stay (HOSPITAL_COMMUNITY): Payer: MEDICARE

## 2012-12-19 ENCOUNTER — Inpatient Hospital Stay (HOSPITAL_COMMUNITY): Payer: MEDICARE | Admitting: Occupational Therapy

## 2012-12-19 LAB — GLUCOSE, CAPILLARY
Glucose-Capillary: 134 mg/dL — ABNORMAL HIGH (ref 70–99)
Glucose-Capillary: 160 mg/dL — ABNORMAL HIGH (ref 70–99)

## 2012-12-19 MED ORDER — WARFARIN SODIUM 5 MG PO TABS
5.0000 mg | ORAL_TABLET | Freq: Once | ORAL | Status: AC
  Administered 2012-12-19: 5 mg via ORAL
  Filled 2012-12-19: qty 1

## 2012-12-19 MED ORDER — INSULIN GLARGINE 100 UNIT/ML ~~LOC~~ SOLN
14.0000 [IU] | Freq: Two times a day (BID) | SUBCUTANEOUS | Status: DC
  Administered 2012-12-19 – 2012-12-20 (×4): 14 [IU] via SUBCUTANEOUS

## 2012-12-19 NOTE — Progress Notes (Signed)
Physical Therapy Note  Patient Details  Name: [name] MRN: [id number] Date of Birth: [birth year] Today's Date: 12/19/2012  2:00 - 2:45 45 minutes Individual session Patient denies pain.  Treatment focused on car transfers, unlevel transfers with sliding board and sitting balance/activity tolerance. Patient performed car transfer using sliding board with verbal cueing and supervision. Patient did need assist to get sliding board out from under his bottom once in car. Patient performed sliding board transfers to unlevel surface 2 inches greater than wheelchair with supervision. Patient worked on dynamic sitting for 6 minutes using ball toss with no loss of balance. Patient propelled wheelchair back to room.    Arelia Longest M 12/19/2012, 3:03 PM

## 2012-12-19 NOTE — Progress Notes (Signed)
12/18/12 2230 Nursing Note: Pt cathed for 300 cc.Ptc/o feeling full but was unable to void.Pt tolerated well.wbb

## 2012-12-19 NOTE — Progress Notes (Signed)
Occupational Therapy Weekly Progress Note  Patient Details  Name: [name] MRN: [id number] Date of Birth: 05-31-1935  Today's Date: 12/19/2012 Time: 1000-1130 Time Calculation (min): 90 min  1:1 Pt seen for BADL retraining from w/c level with lateral weight shifting, UE strengthening with UBE and dowel bar exercises, sit to stand at parallel bars 6x. Of the 6 attempts, pt was only able to pull up into a full stand 2x and maintain for 20- seconds.  Pt was fatigued at end of session and transferred into bed with min assist as he was having difficulty elevating his hips. Pt was able to urinate in the urinal. Reported to nursing. Pt left in bed with call light in reach.  Patient has met 0 of 3 short term goals.  Although he is progressing with his short term goals and has achieved them sometimes, he has not demonstrated the skills consistently.   Patient continues to demonstrate the following deficits: decreased BUE and RLE strength,  and therefore will continue to benefit from skilled OT intervention to enhance overall performance with BADL.  Patient not progressing toward long term goals.  See goal revision.  Plan of care revisions: Pt initially had goals set at mod I, but they have been downgraded to min assist to supervision due to poor UE strength and Ue neuropathy..  OT Short Term Goals Week 1:  OT Short Term Goal 1 (Week 1): Pt will transfer to the [facility] with supervision. OT Short Term Goal 1 - Progress (Week 1): Progressing toward goal OT Short Term Goal 2 (Week 1): Pt will don pants with supervision. OT Short Term Goal 2 - Progress (Week 1): Progressing toward goal OT Short Term Goal 3 (Week 1): Pt will toilet with supervision. OT Short Term Goal 3 - Progress (Week 1): Progressing toward goal Week 2:  OT Short Term Goal 1 (Week 2): Pt will don pants including right sock and shoe with min assist. OT Short Term Goal 2 (Week 2): Pt will toilet on drop arm BSC consistently with min  assist. OT Short Term Goal 3 (Week 2): Pt will consistently transfer to [facility] with supervision.  Skilled Therapeutic Interventions/Progress Updates: Continue with OT 5-7 days a week for 60-90 min for  Balance/vestibular training;Discharge planning;DME/adaptive equipment instruction;Functional mobility training;Patient/family education;Self Care/advanced ADL retraining;Therapeutic Activities;Therapeutic Exercise;UE/LE Strength taining/ROM;UE/LE Coordination activities;Wheelchair propulsion/positioning to maximize his independence with self care.  Therapy Documentation Precautions:  Precautions Precautions: Fall Restrictions Weight Bearing Restrictions: No   Pain: Pain Assessment Pain Assessment: No/denies pain ADL:  See FIM for current functional status  Therapy/Group: Individual Therapy  Adamarie Izzo 12/19/2012, 12:03 PM

## 2012-12-19 NOTE — Progress Notes (Signed)
ANTICOAGULATION CONSULT NOTE - Follow Up Consult  Pharmacy Consult for coumadin Indication: afib and PVD s/p BKA  Allergies  Allergen Reactions  . Iodine Solution (Povidone Iodine) Rash    Topical only    Patient Measurements: Height: 6' 2.02" (188 cm) Weight: 214 lb 4.6 oz (97.2 kg) IBW/kg (Calculated) : 82.24  Heparin Dosing Weight:   Vital Signs: Temp: 97.5 F (36.4 C) (01/17 0602) Temp src: Oral (01/17 0602) BP: 120/56 mmHg (01/17 0602) Pulse Rate: 52  (01/17 0602)  Labs:  Basename 12/19/12 0630 12/18/12 0655 12/17/12 1042 12/17/12 0545  HGB -- -- 9.0* --  HCT -- -- 28.7* --  PLT -- -- 213 --  APTT -- -- -- --  LABPROT 23.1* 25.7* -- 37.9*  INR 2.15* 2.48* -- 4.20*  HEPARINUNFRC -- -- -- --  CREATININE -- -- 2.02* --  CKTOTAL -- -- -- --  CKMB -- -- -- --  TROPONINI -- -- -- --    Estimated Creatinine Clearance: 36.7 ml/min (by C-G formula based on Cr of 2.02).   Medications:  Scheduled:    . bethanechol  25 mg Oral QID  . feeding supplement  237 mL Oral BID BM  . feeding supplement  30 mL Oral BID WC  . furosemide  40 mg Oral Daily  . gabapentin  100 mg Oral TID  . Gerhardt's butt cream   Topical TID  . hydrALAZINE  25 mg Oral TID  . insulin aspart  0-15 Units Subcutaneous TID WC  . insulin glargine  14 Units Subcutaneous BID  . isosorbide dinitrate  20 mg Oral TID  . levothyroxine  50 mcg Oral QAC breakfast  . pantoprazole  40 mg Oral Daily  . polyethylene glycol  17 g Oral Daily  . senna-docusate  2 tablet Oral BID  . simvastatin  40 mg Oral QHS  . Tamsulosin HCl  0.4 mg Oral QPC supper  . [COMPLETED] warfarin  2.5 mg Oral ONCE-1800  . warfarin  5 mg Oral ONCE-1800  . Warfarin - Pharmacist Dosing Inpatient   Does not apply q1800  . [DISCONTINUED] carvedilol  3.125 mg Oral BID WC  . [DISCONTINUED] insulin glargine  12 Units Subcutaneous BID   Infusions:    Assessment: 78 yo male with afib and PVD s/p BKA is currently on therapeutic  coumadin.  INR today down to 2.15. Goal of Therapy:  INR 2-3    Plan:  1) Coumadin 5mg  po x1 2) INR in am  [name], [name] 12/19/2012,10:57 AM

## 2012-12-19 NOTE — Progress Notes (Signed)
Physical Therapy Note  Patient Details  Name: [name] MRN: [id number] Date of Birth: 05-Aug-1935 Today's Date: 12/19/2012  8:05 - 9:05 60 minutes Individual session Patient  denies pain.   Treatment focused on stand pivot transfers and LE exercise. Patient in bed upon entering room. PT assisted with donning pants, right sock and shoe due to time. Patient supine to sit with bed rail and no assist. Paitent sit to stand from elevated bed to rolling walker and pivoted to wheelchair with min assist. Patient propelled wheelchair 150 feet on level tile. Patient set up wheelchair for transfer with cueing for legrests. Patient sit to stand from wheelchair to rolling walker with moderate assist and pivoted to mat. Patient sit to supine with no assist. Patient performed bilateral LE exercise x 15 reps each for ROM and strengthening including in supine: hip flexion, hip abduction, SAQ's; in sidelying, left hip extension and abduction; and in sitting, long arc quads. Patient performed mat to wheelchair stand pivot with RW and mod assist. Patient propelled self back to room.    Arelia Longest M 12/19/2012, 11:46 AM

## 2012-12-19 NOTE — Progress Notes (Signed)
Social Work Patient ID: [name], male   DOB: 06/03/1935, 78 y.o.   MRN: [id number] Spoke with [facility] to find out services they can offer pt.  She reports the ramp will take some time to get approved.  Informed her of the pt and family's plan to Pursue short term NHP until everything can be done, moved and ramp in place.  She will see what other information she needs to be able to present if [facility] can Assist with his ramp and other DME.

## 2012-12-19 NOTE — Progress Notes (Signed)
Patient ID: [name], male   DOB: 06/07/1935, 78 y.o.   MRN: [id number] Subjective/Complaints: Still requiring I/O caths but voided small amt x 2 this am No pain issues, no dizziness No signs of bleeding Review of Systems  Gastrointestinal: Positive for constipation.  Musculoskeletal: Positive for joint pain.  Psychiatric/Behavioral: The patient is nervous/anxious.   All other systems reviewed and are negative.    Objective: Vital Signs: Blood pressure 120/56, pulse 52, temperature 97.5 F (36.4 C), temperature source Oral, resp. rate 20, height 6' 2.02" (1.88 m), weight 97.2 kg (214 lb 4.6 oz), SpO2 98.00%. No results found. Results for orders placed during the hospital encounter of 12/11/12 (from the past 72 hour(s))  GLUCOSE, CAPILLARY     Status: Abnormal   Collection Time   12/16/12 12:36 PM      Component Value Range Comment   Glucose-Capillary 173 (*) 70 - 99 mg/dL   GLUCOSE, CAPILLARY     Status: Abnormal   Collection Time   12/16/12  4:42 PM      Component Value Range Comment   Glucose-Capillary 224 (*) 70 - 99 mg/dL    Comment 1 Notify RN     GLUCOSE, CAPILLARY     Status: Abnormal   Collection Time   12/16/12  8:57 PM      Component Value Range Comment   Glucose-Capillary 171 (*) 70 - 99 mg/dL   PROTIME-INR     Status: Abnormal   Collection Time   12/17/12  5:45 AM      Component Value Range Comment   Prothrombin Time 37.9 (*) 11.6 - 15.2 seconds    INR 4.20 (*) 0.00 - 1.49   GLUCOSE, CAPILLARY     Status: Abnormal   Collection Time   12/17/12  7:35 AM      Component Value Range Comment   Glucose-Capillary 174 (*) 70 - 99 mg/dL   CBC WITH DIFFERENTIAL     Status: Abnormal   Collection Time   12/17/12 10:42 AM      Component Value Range Comment   WBC 9.0  4.0 - 10.5 K/uL    RBC 3.27 (*) 4.22 - 5.81 MIL/uL    Hemoglobin 9.0 (*) 13.0 - 17.0 g/dL    HCT 40.9 (*) 81.1 - 52.0 %    MCV 87.8  78.0 - 100.0 fL    MCH 27.5  26.0 - 34.0 pg    MCHC 31.4  30.0 -  36.0 g/dL    RDW 91.4 (*) 78.2 - 15.5 %    Platelets 213  150 - 400 K/uL    Neutrophils Relative 74  43 - 77 %    Neutro Abs 6.6  1.7 - 7.7 K/uL    Lymphocytes Relative 16  12 - 46 %    Lymphs Abs 1.4  0.7 - 4.0 K/uL    Monocytes Relative 7  3 - 12 %    Monocytes Absolute 0.6  0.1 - 1.0 K/uL    Eosinophils Relative 3  0 - 5 %    Eosinophils Absolute 0.3  0.0 - 0.7 K/uL    Basophils Relative 0  0 - 1 %    Basophils Absolute 0.0  0.0 - 0.1 K/uL   BASIC METABOLIC PANEL     Status: Abnormal   Collection Time   12/17/12 10:42 AM      Component Value Range Comment   Sodium 137  135 - 145 mEq/L    Potassium 4.0  3.5 - 5.1 mEq/L    Chloride 103  96 - 112 mEq/L    CO2 21  19 - 32 mEq/L    Glucose, Bld 206 (*) 70 - 99 mg/dL    BUN 55 (*) 6 - 23 mg/dL    Creatinine, Ser 1.61 (*) 0.50 - 1.35 mg/dL    Calcium 9.0  8.4 - 09.6 mg/dL    GFR calc non Af Amer 31 (*) >90 mL/min    GFR calc Af Amer 35 (*) >90 mL/min   GLUCOSE, CAPILLARY     Status: Abnormal   Collection Time   12/17/12 11:24 AM      Component Value Range Comment   Glucose-Capillary 207 (*) 70 - 99 mg/dL   GLUCOSE, CAPILLARY     Status: Abnormal   Collection Time   12/17/12  4:23 PM      Component Value Range Comment   Glucose-Capillary 207 (*) 70 - 99 mg/dL    Comment 1 Notify RN     GLUCOSE, CAPILLARY     Status: Abnormal   Collection Time   12/17/12  8:59 PM      Component Value Range Comment   Glucose-Capillary 196 (*) 70 - 99 mg/dL   PROTIME-INR     Status: Abnormal   Collection Time   12/18/12  6:55 AM      Component Value Range Comment   Prothrombin Time 25.7 (*) 11.6 - 15.2 seconds    INR 2.48 (*) 0.00 - 1.49   GLUCOSE, CAPILLARY     Status: Abnormal   Collection Time   12/18/12  7:20 AM      Component Value Range Comment   Glucose-Capillary 156 (*) 70 - 99 mg/dL   GLUCOSE, CAPILLARY     Status: Abnormal   Collection Time   12/18/12 11:34 AM      Component Value Range Comment   Glucose-Capillary 213 (*) 70 - 99  mg/dL    Comment 1 Notify RN     GLUCOSE, CAPILLARY     Status: Abnormal   Collection Time   12/18/12  5:23 PM      Component Value Range Comment   Glucose-Capillary 208 (*) 70 - 99 mg/dL   GLUCOSE, CAPILLARY     Status: Abnormal   Collection Time   12/18/12  9:25 PM      Component Value Range Comment   Glucose-Capillary 229 (*) 70 - 99 mg/dL   PROTIME-INR     Status: Abnormal   Collection Time   12/19/12  6:30 AM      Component Value Range Comment   Prothrombin Time 23.1 (*) 11.6 - 15.2 seconds    INR 2.15 (*) 0.00 - 1.49   GLUCOSE, CAPILLARY     Status: Abnormal   Collection Time   12/19/12  7:10 AM      Component Value Range Comment   Glucose-Capillary 134 (*) 70 - 99 mg/dL    Comment 1 Notify RN        HEENT: normal Cardio: RRR Resp: CTA B/L GI: BS positive and Distention Extremity:  Edema Left stump Skin:   Wound C/D/I and no drainage or erythema Neuro: Alert/Oriented, Anxious and Abnormal Sensory reduced sensory R foot Musc/Skel:  Other charcot deformity R ankle Gen NAD Mood/affect appropriate  Assessment/Plan: 1. Functional deficits secondary to L BKA which require 3+ hours per day of interdisciplinary therapy in a comprehensive inpatient rehab setting. Physiatrist is providing close team supervision and 24 hour management  of active medical problems listed below. Physiatrist and rehab team continue to assess barriers to discharge/monitor patient progress toward functional and medical goals. FIM: FIM - Bathing Bathing Steps Patient Completed: Chest;Right Arm;Left Arm;Abdomen;Front perineal area;Buttocks;Left upper leg;Right upper leg Bathing: 4: Min-Patient completes 8-9 63f 10 parts or 75+ percent  FIM - Upper Body Dressing/Undressing Upper body dressing/undressing steps patient completed: Thread/unthread right sleeve of pullover shirt/dresss;Thread/unthread left sleeve of pullover shirt/dress;Pull shirt over trunk;Put head through opening of pull over  shirt/dress Upper body dressing/undressing: 5: Set-up assist to: Obtain clothing/put away FIM - Lower Body Dressing/Undressing Lower body dressing/undressing steps patient completed: Thread/unthread right pants leg;Thread/unthread left pants leg;Pull pants up/down (uses reacher) Lower body dressing/undressing: 4: Min-Patient completed 75 plus % of tasks  FIM - Toileting Toileting steps completed by patient: Adjust clothing prior to toileting;Adjust clothing after toileting;Performs perineal hygiene Toileting: 5: Supervision: Safety issues/verbal cues  FIM - Diplomatic Services operational officer Devices: Bedside commode Toilet Transfers: 1-Two helpers  FIM - Banker Devices: Therapist, occupational: 1: Two helpers (pt required min assist for stand/pivot;2nd person for safet)  FIM - Locomotion: Wheelchair Distance: 150 feet Locomotion: Wheelchair: 0: Activity did not occur FIM - Locomotion: Ambulation Ambulation/Gait Assistance: Not tested (comment) Locomotion: Ambulation: 0: Activity did not occur  Comprehension Comprehension Mode: Auditory Comprehension: 5-Follows basic conversation/direction: With no assist  Expression Expression Mode: Verbal Expression: 5-Expresses complex 90% of the time/cues < 10% of the time  Social Interaction Social Interaction Mode: Asleep Social Interaction: 6-Interacts appropriately with others with medication or extra time (anti-anxiety, antidepressant).  Problem Solving Problem Solving: 5-Solves basic 90% of the time/requires cueing < 10% of the time  Memory Memory: 5-Recognizes or recalls 90% of the time/requires cueing < 10% of the time  Medical Problem List and Plan:  1. 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 DVT Prophylaxis/Anticoagulation: Chronic Coumadin therapy for history of PAF/ICD. Continue Lovenox for INR greater than 2.00. Monitor for a bleeding episodes.   3. Pain Management: Percocet, Robaxin as needed. Monitor with increased mobility  4. Neuropsych: This patient Is capable of making decisions on his/her own behalf.  5. Postoperative anemia. Latest hemoglobin stable at 8.3. Followup CBC. Patient currently asymptomatic  6. Stage II pressure ulcer right heel. Continue Prevalon boot. Followup per wound care nurse  7. Hypertension. Isordil 20 mg 3 times a day, hydralazine 25 mg 3 times a day, Lasix 80 mg daily, coreg 25 mg twice a day.will decrease coreg dose, hold this am due to brady Monitor with increased mobility  8. Chronic systolic congestive heart failure. reduce Lasix 40 mg daily and monitor for any signs of fluid overload  9. Diabetes mellitus with peripheral neuropathy. Latest hemoglobin A1c 8.4. Lantus insulin will adjust Patient was taking 10 units Lantus insulin twice daily prior to admission. Check blood sugars a.c. and at bedtime  10. Hypothyroidism. Synthroid  11. Hyperlipidemia. Zocor  12. History of gout. Allopurinol. Monitor for any signs of flare up  13. Chronic renal insufficiency. Baseline creatinine 2.33. Followup chemistries back at baseline but was down in 1-2 range.  Will reduce lasix stop allopurinol, monitor for gout sx 14. Coronary artery disease with CABG/ICD. No chest pain or shortness of breath. Followup cardiology services as needed  15.  Urinary retention probably multifactorial immobility and diabetic cystopathy +/- BPH, increase urecholine to QID, on flomax 16.  Bradycardia have reduced coreg to lowest dose but still ~50 will D/C and monitor  LOS (  Days) 8 A FACE TO FACE EVALUATION WAS PERFORMED  Cher Egnor E 12/19/2012, 7:26 AM

## 2012-12-20 ENCOUNTER — Inpatient Hospital Stay (HOSPITAL_COMMUNITY): Payer: MEDICARE | Admitting: Physical Therapy

## 2012-12-20 DIAGNOSIS — E119 Type 2 diabetes mellitus without complications: Secondary | ICD-10-CM

## 2012-12-20 DIAGNOSIS — S88119A Complete traumatic amputation at level between knee and ankle, unspecified lower leg, initial encounter: Secondary | ICD-10-CM

## 2012-12-20 DIAGNOSIS — N189 Chronic kidney disease, unspecified: Secondary | ICD-10-CM

## 2012-12-20 DIAGNOSIS — N184 Chronic kidney disease, stage 4 (severe): Secondary | ICD-10-CM

## 2012-12-20 DIAGNOSIS — I5022 Chronic systolic (congestive) heart failure: Secondary | ICD-10-CM

## 2012-12-20 DIAGNOSIS — N179 Acute kidney failure, unspecified: Secondary | ICD-10-CM

## 2012-12-20 DIAGNOSIS — I509 Heart failure, unspecified: Secondary | ICD-10-CM

## 2012-12-20 LAB — GLUCOSE, CAPILLARY: Glucose-Capillary: 192 mg/dL — ABNORMAL HIGH (ref 70–99)

## 2012-12-20 LAB — PROTIME-INR: INR: 1.84 — ABNORMAL HIGH (ref 0.00–1.49)

## 2012-12-20 MED ORDER — WARFARIN SODIUM 5 MG PO TABS
5.0000 mg | ORAL_TABLET | Freq: Once | ORAL | Status: AC
  Administered 2012-12-20: 5 mg via ORAL
  Filled 2012-12-20: qty 1

## 2012-12-20 NOTE — Progress Notes (Signed)
ANTICOAGULATION CONSULT NOTE - Follow Up Consult  Pharmacy Consult for coumadin Indication: afib and PVD s/p BKA  Allergies  Allergen Reactions  . Iodine Solution (Povidone Iodine) Rash    Topical only    Patient Measurements: Height: 6' 2.02" (188 cm) Weight: 214 lb 4.6 oz (97.2 kg) IBW/kg (Calculated) : 82.24   Vital Signs: Temp: 98.7 F (37.1 C) (01/18 0500) Temp src: Oral (01/18 0500) BP: 132/74 mmHg (01/18 0500) Pulse Rate: 50  (01/18 0500)  Labs:  Basename 12/20/12 0655 12/19/12 0630 12/18/12 0655  HGB -- -- --  HCT -- -- --  PLT -- -- --  APTT -- -- --  LABPROT 20.6* 23.1* 25.7*  INR 1.84* 2.15* 2.48*  HEPARINUNFRC -- -- --  CREATININE -- -- --  CKTOTAL -- -- --  CKMB -- -- --  TROPONINI -- -- --    Estimated Creatinine Clearance: 36.7 ml/min (by C-G formula based on Cr of 2.02).  Assessment: 78 yo male with afib and PVD s/p BKA. INR currently subtherapeutic after 2 doses held 1/14 and 1/15 due to supratherapeutic INR.  Goal of Therapy:  INR 2-3  Plan:  1) Coumadin 5mg  po again today 2) INR in am  Christoper Fabian, PharmD, BCPS Clinical pharmacist, pager [phone] 12/20/2012,12:26 PM

## 2012-12-20 NOTE — Progress Notes (Signed)
Patient ID: [name], male   DOB: 11-Aug-1935, 78 y.o.   MRN: [id number] Subjective/Complaints: Patient denies any specific complaints. He does admit to diffuse aches and pains related to therapy. No significant pain at the BKA site. Review of Systems  Gastrointestinal: Positive for constipation.  Musculoskeletal: Positive for joint pain.  Psychiatric/Behavioral: The patient is nervous/anxious.   All other systems reviewed and are negative.    Objective: Vital Signs: Blood pressure 132/74, pulse 50, temperature 98.7 F (37.1 C), temperature source Oral, resp. rate 17, height 6' 2.02" (1.88 m), weight 214 lb 4.6 oz (97.2 kg), SpO2 98.00%.  CBG (last 3)   Basename 12/20/12 0750 12/19/12 2103 12/19/12 1638  GLUCAP 113* 187* 173*    Elderly male in no acute distress. Affect is normal and not be. Chest clear to auscultation cardiac exam S1-S2 are regular. Abdominal exam overweight, active bowel sounds, soft. Extremities: Status post left BKA. Trace edema at the right ankle. Neurologic exam is alert. Assessment/Plan: 1. Functional deficits secondary to L BKA  Medical Problem List and Plan:  1. Left BKA secondary to ischemic nonhealing ulcers 12/08/2012 no significant pain at this time. 2. DVT Prophylaxis/Anticoagulation: Chronic Coumadin therapy for history of PAF/ICD. He is off enoxaparin.  3. Pain Management: Percocet, Robaxin as needed. Monitor with increased mobility . I think his current aches and pains are related to therapy and not specifically related to do any other disorder. 4. Neuropsych: This patient Is capable of making decisions on his/her own behalf.  5. Postoperative anemia. Latest hemoglobin stable   Patient currently asymptomatic  Lab Results  Component Value Date   HGB 9.0* 12/17/2012   6. Stage II pressure ulcer right heel. Continue Prevalon boot. Followup per wound care nurse  7. Hypertension. Isordil 20 mg 3 times a day, hydralazine 25 mg 3 times a day, Lasix 80  mg daily, coreg 25 mg twice a day.will decrease coreg dose, hold this am due to brady Monitor with increased mobility  BP Readings from Last 3 Encounters:  12/20/12 132/74  12/11/12 116/58  12/11/12 116/58    8. Chronic systolic congestive heart failure. No signs of fluid overload.  9. Diabetes mellitus with peripheral neuropathy. Latest hemoglobin A1c 8.4. Lantus insulin will adjust Patient was taking 10 units Lantus insulin twice daily prior to admission. Check blood sugars a.c. and at bedtime blood sugars are variable. We'll continue to follow at this time. 10. Hypothyroidism. Synthroid  11. Hyperlipidemia. Zocor  12. History of gout.  Monitor for any signs of flare up  13. Chronic renal insufficiency. Baseline creatinine 2.33. Followup chemistries back at baseline but was down in 1-2 range. Lab Results  Component Value Date   CREATININE 2.02* 12/17/2012   14. Coronary artery disease with CABG/ICD. No chest pain or shortness of breath. Followup cardiology services as needed  15.  Urinary retention probably multifactorial immobility and diabetic cystopathy +/- BPH, increase urecholine to QID, on flomax 16.  Bradycardia have reduced coreg to lowest dose but still ~50 will D/C and monitor  LOS (Days) 9 A FACE TO FACE EVALUATION WAS PERFORMED  SWORDS,BRUCE HENRY 12/20/2012, 9:35 AM

## 2012-12-20 NOTE — Progress Notes (Signed)
Physical Therapy Session Note  Patient Details  Name: NOTNAMED SCHOLZ MRN: [id number] Date of Birth: November 02, 1935  Today's Date: 12/20/2012 Time: 1000-1100 Time Calculation (min): 60 min  Short Term Goals: Week 1:  PT Short Term Goal 1 (Week 1): Patient will perform wheelchair to mat or bed transfers with min assist. PT Short Term Goal 1 - Progress (Week 1): Met PT Short Term Goal 2 (Week 1): Patient will propel wheelchair 100 feet with bilateral UE's in controlled environment. PT Short Term Goal 2 - Progress (Week 1): Met PT Short Term Goal 3 (Week 1): Patient will sit to stand with +1 mod assist PT Short Term Goal 3 - Progress (Week 1): Met PT Short Term Goal 4 (Week 1): Patient will maintain standing 2 minutes with minimal assistance and rolling walker PT Short Term Goal 4 - Progress (Week 1): Met PT Short Term Goal 5 (Week 1): Patient will participate in dynamic sitting balance activity 5 minutes with supervision edge of mat. PT Short Term Goal 5 - Progress (Week 1): Not met  Skilled Therapeutic Interventions/Progress Updates:  Pt participated in group therapy for LE strengthening and ROM.  Patient pushed to gym in [facility] by staff.  Patient performed the following supine therex for LE strengthening and ROM 3x10 bilaterally:  Quad sets, glut sets, TKE, hip/knee flexion, hip abduction with orange theraband.  In sitting patient performed LE strenthening and ROM 3x10 bilaterally: LAQ with 5 second hold, marching, hip adduction with pillow between knees with 5 second hold.  Patient participated in seated ball toss with rotation for trunk strengthening.  Patient propelled WC in hallway 60'; wheeled by staff back to room remainder of distance at end of session.    Pain: Pain Assessment Pain Assessment: No/denies pain  See FIM for current functional status  Therapy/Group: Group Therapy  Newman Pies A 12/20/2012, 12:25 PM

## 2012-12-21 ENCOUNTER — Inpatient Hospital Stay (HOSPITAL_COMMUNITY): Payer: MEDICARE | Admitting: *Deleted

## 2012-12-21 DIAGNOSIS — Z794 Long term (current) use of insulin: Secondary | ICD-10-CM

## 2012-12-21 LAB — PROTIME-INR
INR: 2 — ABNORMAL HIGH (ref 0.00–1.49)
Prothrombin Time: 21.9 seconds — ABNORMAL HIGH (ref 11.6–15.2)

## 2012-12-21 LAB — GLUCOSE, CAPILLARY
Glucose-Capillary: 194 mg/dL — ABNORMAL HIGH (ref 70–99)
Glucose-Capillary: 196 mg/dL — ABNORMAL HIGH (ref 70–99)

## 2012-12-21 MED ORDER — WARFARIN SODIUM 5 MG PO TABS
5.0000 mg | ORAL_TABLET | Freq: Once | ORAL | Status: AC
  Administered 2012-12-21: 5 mg via ORAL
  Filled 2012-12-21: qty 1

## 2012-12-21 MED ORDER — INSULIN GLARGINE 100 UNIT/ML ~~LOC~~ SOLN
16.0000 [IU] | Freq: Two times a day (BID) | SUBCUTANEOUS | Status: DC
  Administered 2012-12-21 – 2012-12-23 (×5): 16 [IU] via SUBCUTANEOUS

## 2012-12-21 NOTE — Progress Notes (Signed)
ANTICOAGULATION CONSULT NOTE - Follow Up Consult  Pharmacy Consult for coumadin Indication: afib and PVD s/p BKA  Allergies  Allergen Reactions  . Iodine Solution (Povidone Iodine) Rash    Topical only    Patient Measurements: Height: 6' 2.02" (188 cm) Weight: 216 lb 4.3 oz (98.1 kg) IBW/kg (Calculated) : 82.24   Vital Signs: Temp: 99 F (37.2 C) (01/19 0500) Temp src: Oral (01/19 0500) BP: 145/81 mmHg (01/19 0500) Pulse Rate: 79  (01/19 0500)  Labs:  Basename 12/21/12 0559 12/20/12 0655 12/19/12 0630  HGB -- -- --  HCT -- -- --  PLT -- -- --  APTT -- -- --  LABPROT 21.9* 20.6* 23.1*  INR 2.00* 1.84* 2.15*  HEPARINUNFRC -- -- --  CREATININE -- -- --  CKTOTAL -- -- --  CKMB -- -- --  TROPONINI -- -- --    Estimated Creatinine Clearance: 36.7 ml/min (by C-G formula based on Cr of 2.02).  Assessment: 78 yo male with afib and PVD s/p BKA. INR currently therapeutic. No bleeding noted.  Goal of Therapy:  INR 2-3  Plan:  1) Coumadin 5mg  po again today 2) INR in am  Christoper Fabian, PharmD, BCPS Clinical pharmacist, pager [phone] 12/21/2012,8:42 AM

## 2012-12-21 NOTE — Progress Notes (Signed)
Patient ID: [name], male   DOB: 17-May-1935, 78 y.o.   MRN: [id number] Subjective/Complaints: No complaints this a.m. No significant pain at BKA site. Slept well Review of Systems  Psychiatric/Behavioral: The patient is nervous/anxious.     Objective: Vital Signs: Blood pressure 145/81, pulse 79, temperature 99 F (37.2 C), temperature source Oral, resp. rate 17, height 6' 2.02" (1.88 m), weight 216 lb 4.3 oz (98.1 kg), SpO2 95.00%.  CBG (last 3)   Basename 12/20/12 2015 12/20/12 1648 12/20/12 1152  GLUCAP 222* 219* 192*    Elderly male in no acute distress. Affect is normal and not be. Chest clear to auscultation cardiac exam S1-S2 are regular. Abdominal exam overweight, active bowel sounds, soft. Extremities: Status post left BKA. Trace edema at the right ankle. Neurologic exam is alert.left foot covered with protective boot. Assessment/Plan: 1. Functional deficits secondary to L BKA  Medical Problem List and Plan:  1. Left BKA secondary to ischemic nonhealing ulcers 12/08/2012 no significant pain at this time. 2. DVT Prophylaxis/Anticoagulation: Chronic Coumadin therapy for history of PAF/ICD. He is off enoxaparin.  3. Pain Management: Percocet, Robaxin as needed.  4. Neuropsych: This patient Is capable of making decisions on his/her own behalf.  5. Postoperative anemia. Latest hemoglobin stable.   Patient currently asymptomatic  Lab Results  Component Value Date   HGB 9.0* 12/17/2012   6. Stage II pressure ulcer right heel. Continue Prevalon boot. Followup per wound care nurse  7. Hypertension. Isordil 20 mg 3 times a day, hydralazine 25 mg 3 times a day, Lasix 80 mg daily, coreg 25 mg twice a day. BP is ok for now BP Readings from Last 3 Encounters:  12/21/12 145/81  12/11/12 116/58  12/11/12 116/58   8. Chronic systolic congestive heart failure. No signs of fluid overload.  9. Diabetes mellitus with peripheral neuropathy. Latest hemoglobin A1c 8.4. Lantus insulin  will adjust Patient was taking 10 units Lantus insulin twice daily prior to admission. Check blood sugars a.c. and at bedtime blood sugars are variable. Increase lantus 12/21/2012 10. Hypothyroidism. Synthroid  11. Hyperlipidemia. Zocor  12. History of gout.  Monitor for any signs of flare up  13. Chronic renal insufficiency. Baseline creatinine 2.33. Followup chemistries back at baseline but was down in 1-2 range. Lab Results  Component Value Date   CREATININE 2.02* 12/17/2012   14. Coronary artery disease with CABG/ICD. No chest pain or shortness of breath.  15.  Urinary retention probably multifactorial immobility and diabetic cystopathy +/- BPH, increase urecholine to QID, on flomax 16.  Bradycardia have reduced coreg to lowest dose but still ~50 will D/C and monitor- seems to be improving Pulse Readings from Last 3 Encounters:  12/21/12 79  12/11/12 55  12/11/12 55     LOS (Days) 10 A FACE TO FACE EVALUATION WAS PERFORMED  Yari Szeliga HENRY 12/21/2012, 7:12 AM

## 2012-12-21 NOTE — Progress Notes (Signed)
HR at HS=54. Left ICD. Refused scheduled senna and HS snack. +1 pitting edema to RLE, +2 to top of right foot. Right prevalon boot in place. Ace wrap to Left BKA CD&I. SOB at times with abdominal breathing. Taking 2 percocet for pain, waits until pain is severe before letting staff know. May need to schedule pain meds for now or offer pain meds regularly. Right shin and right heel with allevyn dressings CD & I. Left BKA dressing with ace wrap in place. Alfredo Martinez A

## 2012-12-21 NOTE — Progress Notes (Signed)
Physical Therapy Session Note  Patient Details  Name: [name] MRN: [id number] Date of Birth: Feb 10, 1935  Today's Date: 12/21/2012 Time: 1100-1200 Time Calculation (min): 60 min  Short Term Goals: Week 2:  PT Short Term Goal 1 (Week 2): LTGs  Skilled Therapeutic Interventions/Progress Updates:    NWB L LE precautions maintained throughout session. Patient participated in LE strengthening group: Patient participated in LE strengthening group. In supine: B hamstring stretches 3x30" each, B glut sets with 3" hold x20, B quad sets with 3" hold x20, B SLR x20, SAQ with 3" hold x20; In sidelying: B hip abduction x20, B hip extension x20; Seated edge of mat: B LAQ x20. Patient requires several rest breaks throughout session.   Therapy Documentation Precautions:  Precautions Precautions: Fall Restrictions Weight Bearing Restrictions: Yes LLE Weight Bearing: Non weight bearing Pain: Pain Assessment Pain Assessment: No/denies pain Pain Score: 0-No pain Locomotion : Ambulation Ambulation/Gait Assistance: Not tested (comment) Wheelchair Mobility Distance: 150   See FIM for current functional status  Therapy/Group: Group Therapy  Braelon Sprung S Meosha Castanon S. Bich Mchaney, PT, DPT  12/21/2012, 12:21 PM

## 2012-12-22 ENCOUNTER — Inpatient Hospital Stay (HOSPITAL_COMMUNITY): Payer: MEDICARE

## 2012-12-22 ENCOUNTER — Inpatient Hospital Stay (HOSPITAL_COMMUNITY): Payer: MEDICARE | Admitting: Occupational Therapy

## 2012-12-22 LAB — GLUCOSE, CAPILLARY: Glucose-Capillary: 160 mg/dL — ABNORMAL HIGH (ref 70–99)

## 2012-12-22 LAB — PROTIME-INR: Prothrombin Time: 23.4 seconds — ABNORMAL HIGH (ref 11.6–15.2)

## 2012-12-22 MED ORDER — BETHANECHOL CHLORIDE 25 MG PO TABS
25.0000 mg | ORAL_TABLET | Freq: Three times a day (TID) | ORAL | Status: DC
  Administered 2012-12-22 – 2012-12-23 (×3): 25 mg via ORAL
  Filled 2012-12-22 (×6): qty 1

## 2012-12-22 MED ORDER — WARFARIN SODIUM 5 MG PO TABS
5.0000 mg | ORAL_TABLET | Freq: Once | ORAL | Status: AC
  Administered 2012-12-22: 5 mg via ORAL
  Filled 2012-12-22: qty 1

## 2012-12-22 MED ORDER — OXYCODONE-ACETAMINOPHEN 5-325 MG PO TABS
1.0000 | ORAL_TABLET | Freq: Four times a day (QID) | ORAL | Status: DC | PRN
  Administered 2012-12-23 (×3): 2 via ORAL
  Filled 2012-12-22 (×4): qty 2

## 2012-12-22 NOTE — Progress Notes (Signed)
Physical Therapy Session Note  Patient Details  Name: [name] MRN: [id number] Date of Birth: December 10, 1934  Today's Date: 12/22/2012 Time: 0910-1000 and 1308-6578 Time Calculation (min): 50 min and 40 min  Short Term Goals: Week 1:  PT Short Term Goal 1 (Week 1): Patient will perform wheelchair to mat or bed transfers with min assist. PT Short Term Goal 1 - Progress (Week 1): Met PT Short Term Goal 2 (Week 1): Patient will propel wheelchair 100 feet with bilateral UE's in controlled environment. PT Short Term Goal 2 - Progress (Week 1): Met PT Short Term Goal 3 (Week 1): Patient will sit to stand with +1 mod assist PT Short Term Goal 3 - Progress (Week 1): Met PT Short Term Goal 4 (Week 1): Patient will maintain standing 2 minutes with minimal assistance and rolling walker PT Short Term Goal 4 - Progress (Week 1): Met PT Short Term Goal 5 (Week 1): Patient will participate in dynamic sitting balance activity 5 minutes with supervision edge of mat. PT Short Term Goal 5 - Progress (Week 1): Not met  STGS week 2= LTGs  Skilled Therapeutic Interventions/Progress Updates:   AM- Supine > sit with HOB elevated, use of rails, with min assist.  SB transfer to R with supervision after set-up of w/c.  Pt able to move armrest, place SB with VCs.  W/c mobility using bil UEs x 100' slowly, with VCs for more efficient technique.  Pt has difficulty gripping rims due to hand weakness.   SB transfer to L to NuStep, set at level 4 , x 7 minutes, rated 12 on Borg scale, using bil UEs and RLE, for increased activity tolerance. Use of NuStep at level7,  using RLE only, for strengthening, 10 x 1 rated 15 by pt on Borg scale.    RLE strengthening in sitting: 10 x 1 each: L knee extension, L knee isometric quad sets, bil hip abduction using orange Theraband  PM-  Pain rated "pretty low" unable to place a number, bil feet, including L phantom pain  Therapeutic exercise performed with LEs to  increase strength for functional mobility: supine with HOB elevated: R straight leg raises, LLE hip extension with bolster under L thigh, L knee extension 10 x 1 each; in R sidelying: L hip extension 20 x 1  and L hip abduction, 10 x 1.  Pt provided with hand out of above exercises.    Supine> sit with elevated HOB, rails, with supervision, without cues, supervision.  L LE phantom pain exercises, with LLE covered, pt focusing on bil motions of knee extension and ankle DF.  Pt reported decreased phantom pain after about 3 min  Sit>< stand from 27" high bed, with L hand on bed, R hand on RW, with min assist.  Pt stood x 2 minutes with close superviison.   Stand pivot transfer from bed as above, to w/c, with RW to R, min guard assist.  W/c parts management: pt placed R legrest back onto w/c without cues.         Therapy Documentation Precautions:  Precautions Precautions: Fall Precaution Comments: hx of falls Restrictions Weight Bearing Restrictions: Yes LLE Weight Bearing: Non weight bearing   Pain: Pain Assessment Pain Assessment: Faces Pain Score:   8 (bil foot pain, including L phantom pain) AM Faces Pain Scale: Hurts whole lot Pain Type: Chronic pain Pain Location: Foot Pain Orientation: Left;Right Pain Descriptors: Shooting Pain Frequency: Intermittent Pain Onset: Gradual Patients Stated Pain Goal: 3 Pain  Intervention(s): Medication (See eMAR);RN made aware Multiple Pain Sites: No   See FIM for current functional status  Therapy/Group: Individual Therapy  Canyon Willow 12/22/2012, 10:07 AM

## 2012-12-22 NOTE — Plan of Care (Signed)
Problem: RH BOWEL ELIMINATION Goal: Bowel movement every 2 days Min assist  Outcome: Not Progressing LBM 1/17, on scheduled miralax and senna

## 2012-12-22 NOTE — Progress Notes (Signed)
Occupational Therapy Session Note  Patient Details  Name: [name] MRN: [id number] Date of Birth: June 22, 1935  Today's Date: 12/22/2012 Time: 1000-1055 Time Calculation (min): 55 min  Short Term Goals: Week 1:  OT Short Term Goal 1 (Week 1): Pt will transfer to the [facility] with supervision. OT Short Term Goal 1 - Progress (Week 1): Progressing toward goal OT Short Term Goal 2 (Week 1): Pt will don pants with supervision. OT Short Term Goal 2 - Progress (Week 1): Progressing toward goal OT Short Term Goal 3 (Week 1): Pt will toilet with supervision. OT Short Term Goal 3 - Progress (Week 1): Progressing toward goal Week 2:  OT Short Term Goal 1 (Week 2): Pt will don pants including right sock and shoe with min assist. OT Short Term Goal 2 (Week 2): Pt will toilet on drop arm BSC consistently with min assist. OT Short Term Goal 3 (Week 2): Pt will consistently transfer to [facility] with supervision.  Skilled Therapeutic Interventions/Progress Updates:    Pt seen this am for BADL retraining of self care from both a bed and w/c level.  Pt completed UB self care at sink and then worked from bed level.  To facilitate toileting skills, a heavy duty BSC with both arms down was placed next to bed and his w/c was placed adjacent to [facility].  Pt worked on scoot transfers with close supervision with cues to elevate his hips as much as possible. Pt worked on lateral leans on [facility] for Acupuncturist and cleanup. Pt was very fatigued at end of session so he transferred back to bed with call light within reach.  Therapy Documentation Precautions:  Precautions Precautions: Fall Precaution Comments: hx of falls Restrictions Weight Bearing Restrictions: Yes LLE Weight Bearing: Non weight bearing   Pain: Pain Assessment Pain Assessment: Faces Pain Score:   8 (bil foot pain, including L phantom pain) Faces Pain Scale: Hurts whole lot Pain Type: Chronic pain Pain Location: Foot Pain Orientation:  Left;Right Pain Descriptors: Shooting Pain Frequency: Intermittent Pain Onset: Gradual Patients Stated Pain Goal: 3 Pain Intervention(s): Medication (See eMAR);RN made aware Multiple Pain Sites: No ADL: See FIM for current functional status  Therapy/Group: Individual Therapy  General Wearing 12/22/2012, 11:46 AM

## 2012-12-22 NOTE — Plan of Care (Signed)
Problem: RH BOWEL ELIMINATION Goal: RH STG MANAGE BOWEL WITH ASSISTANCE Min assist  Outcome: Not Applicable Date Met:  12/22/12 duplicate

## 2012-12-22 NOTE — Plan of Care (Signed)
Problem: RH BOWEL ELIMINATION Goal: RH STG MANAGE BOWEL WITH ASSISTANCE STG Manage Bowel with Assistance. Min assist  Outcome: Not Progressing LBM 12/19/2012. Stool softener given as per scheduled. Offered Sorbitol attempted 3X patient refused. Goal: RH STG MANAGE BOWEL W/MEDICATION W/ASSISTANCE STG Manage Bowel with Medication with Assistance. Min assist  Outcome: Not Progressing LBM 12/19/2012. Stool softener given as per scheduled. Offered sorbitol attempted 3x patient refused. Goal: RH STG MANAGE BOWEL W/EQUIPMENT W/ASSISTANCE STG Manage Bowel With Equipment With Assistance min assist  Outcome: Not Progressing LBM 12/19/2012. Stool softener given as per scheduled. Offered sorbitol attempted 3 X patient refused

## 2012-12-22 NOTE — Progress Notes (Signed)
Social Work Patient ID: [name], male   DOB: May 10, 1935, 78 y.o.   MRN: [id number] Contacted [facility] daughter's choice for pt to go to [facility].  Crystal-Admission Coordinator to had DON look at information and get back with this worker. May have a bed for pt this week.  Await call back.

## 2012-12-22 NOTE — Progress Notes (Signed)
ANTICOAGULATION CONSULT NOTE - Follow Up Consult  Pharmacy Consult for coumadin Indication: afib and PVD s/p BKS  Allergies  Allergen Reactions  . Iodine Solution (Povidone Iodine) Rash    Topical only    Patient Measurements: Height: 6' 2.02" (188 cm) Weight: 216 lb 4.3 oz (98.1 kg) IBW/kg (Calculated) : 82.24  Heparin Dosing Weight:   Vital Signs: Temp: 98.4 F (36.9 C) (01/20 0526) Temp src: Oral (01/20 0526) BP: 145/61 mmHg (01/20 0526) Pulse Rate: 53  (01/20 0526)  Labs:  Basename 12/22/12 0505 12/21/12 0559 12/20/12 0655  HGB -- -- --  HCT -- -- --  PLT -- -- --  APTT -- -- --  LABPROT 23.4* 21.9* 20.6*  INR 2.19* 2.00* 1.84*  HEPARINUNFRC -- -- --  CREATININE -- -- --  CKTOTAL -- -- --  CKMB -- -- --  TROPONINI -- -- --    Estimated Creatinine Clearance: 36.7 ml/min (by C-G formula based on Cr of 2.02).   Medications:  Scheduled:    . bethanechol  25 mg Oral QID  . feeding supplement  237 mL Oral BID BM  . feeding supplement  30 mL Oral BID WC  . furosemide  40 mg Oral Daily  . gabapentin  100 mg Oral TID  . Gerhardt's butt cream   Topical TID  . hydrALAZINE  25 mg Oral TID  . insulin aspart  0-15 Units Subcutaneous TID WC  . insulin glargine  16 Units Subcutaneous BID  . isosorbide dinitrate  20 mg Oral TID  . levothyroxine  50 mcg Oral QAC breakfast  . pantoprazole  40 mg Oral Daily  . polyethylene glycol  17 g Oral Daily  . senna-docusate  2 tablet Oral BID  . simvastatin  40 mg Oral QHS  . Tamsulosin HCl  0.4 mg Oral QPC supper  . [COMPLETED] warfarin  5 mg Oral ONCE-1800  . warfarin  5 mg Oral ONCE-1800  . Warfarin - Pharmacist Dosing Inpatient   Does not apply q1800   Infusions:    Assessment: 78 yo male with afib and PVD s/p BKA is currently on therapeutic coumadin.  INR today was 2.18. Goal of Therapy:  INR 2-3    Plan:  1) Coumadin 5mg  po x1 2) INR in am.  Bawi Lakins, [name] 12/22/2012,8:46 AM

## 2012-12-22 NOTE — Progress Notes (Signed)
Social Work Patient ID: [name], male   DOB: August 16, 1935, 78 y.o.   MRN: [id number] Spoke with crystal form [facility] my have a bed Wed or Thurs for pt.  Will contact tomorrow and try to confirm bed availability. Pt is aware and agreeable.

## 2012-12-22 NOTE — Plan of Care (Signed)
Problem: RH BLADDER ELIMINATION Goal: RH STG MANAGE BLADDER WITH EQUIPMENT WITH ASSISTANCE STG Manage Bladder With Equipment With Assistance min assist  Outcome: Progressing Voiding, no In and Out cath required so far this shift

## 2012-12-22 NOTE — Progress Notes (Signed)
Patient ID: [name], male   DOB: 1935-07-21, 78 y.o.   MRN: [id number] Subjective/Complaints: Still requiring I/O caths but voided small amt x 2 this am No pain issues, no dizziness No signs of bleeding Review of Systems  Gastrointestinal: Positive for constipation.  Musculoskeletal: Positive for joint pain.  Psychiatric/Behavioral: The patient is nervous/anxious.   All other systems reviewed and are negative.    Objective: Vital Signs: Blood pressure 145/61, pulse 53, temperature 98.4 F (36.9 C), temperature source Oral, resp. rate 19, height 6' 2.02" (1.88 m), weight 98.1 kg (216 lb 4.3 oz), SpO2 96.00%. No results found. Results for orders placed during the hospital encounter of 12/11/12 (from the past 72 hour(s))  GLUCOSE, CAPILLARY     Status: Abnormal   Collection Time   12/19/12 11:45 AM      Component Value Range Comment   Glucose-Capillary 160 (*) 70 - 99 mg/dL    Comment 1 Notify RN     GLUCOSE, CAPILLARY     Status: Abnormal   Collection Time   12/19/12  4:38 PM      Component Value Range Comment   Glucose-Capillary 173 (*) 70 - 99 mg/dL   GLUCOSE, CAPILLARY     Status: Abnormal   Collection Time   12/19/12  9:03 PM      Component Value Range Comment   Glucose-Capillary 187 (*) 70 - 99 mg/dL   PROTIME-INR     Status: Abnormal   Collection Time   12/20/12  6:55 AM      Component Value Range Comment   Prothrombin Time 20.6 (*) 11.6 - 15.2 seconds    INR 1.84 (*) 0.00 - 1.49   GLUCOSE, CAPILLARY     Status: Abnormal   Collection Time   12/20/12  7:50 AM      Component Value Range Comment   Glucose-Capillary 113 (*) 70 - 99 mg/dL    Comment 1 Notify RN     GLUCOSE, CAPILLARY     Status: Abnormal   Collection Time   12/20/12 11:52 AM      Component Value Range Comment   Glucose-Capillary 192 (*) 70 - 99 mg/dL    Comment 1 Notify RN     GLUCOSE, CAPILLARY     Status: Abnormal   Collection Time   12/20/12  4:48 PM      Component Value Range Comment   Glucose-Capillary 219 (*) 70 - 99 mg/dL    Comment 1 Notify RN     GLUCOSE, CAPILLARY     Status: Abnormal   Collection Time   12/20/12  8:15 PM      Component Value Range Comment   Glucose-Capillary 222 (*) 70 - 99 mg/dL    Comment 1 Notify RN     PROTIME-INR     Status: Abnormal   Collection Time   12/21/12  5:59 AM      Component Value Range Comment   Prothrombin Time 21.9 (*) 11.6 - 15.2 seconds    INR 2.00 (*) 0.00 - 1.49   GLUCOSE, CAPILLARY     Status: Abnormal   Collection Time   12/21/12  7:36 AM      Component Value Range Comment   Glucose-Capillary 120 (*) 70 - 99 mg/dL    Comment 1 Notify RN     GLUCOSE, CAPILLARY     Status: Abnormal   Collection Time   12/21/12 11:59 AM      Component Value Range Comment  Glucose-Capillary 136 (*) 70 - 99 mg/dL    Comment 1 Notify RN     GLUCOSE, CAPILLARY     Status: Abnormal   Collection Time   12/21/12  4:38 PM      Component Value Range Comment   Glucose-Capillary 194 (*) 70 - 99 mg/dL    Comment 1 Notify RN     GLUCOSE, CAPILLARY     Status: Abnormal   Collection Time   12/21/12  9:16 PM      Component Value Range Comment   Glucose-Capillary 196 (*) 70 - 99 mg/dL    Comment 1 Notify RN      Comment 2 Documented in Chart     PROTIME-INR     Status: Abnormal   Collection Time   12/22/12  5:05 AM      Component Value Range Comment   Prothrombin Time 23.4 (*) 11.6 - 15.2 seconds    INR 2.19 (*) 0.00 - 1.49   GLUCOSE, CAPILLARY     Status: Abnormal   Collection Time   12/22/12  7:23 AM      Component Value Range Comment   Glucose-Capillary 135 (*) 70 - 99 mg/dL    Comment 1 Notify RN        HEENT: normal Cardio: RRR Resp: CTA B/L GI: BS positive and Distention Extremity:  Edema Left stump Skin:   Wound C/D/I and no drainage or erythema Neuro: Alert/Oriented, Anxious and Abnormal Sensory reduced sensory R foot Musc/Skel:  Other charcot deformity R ankle Gen NAD Mood/affect appropriate  Assessment/Plan: 1.  Functional deficits secondary to L BKA which require 3+ hours per day of interdisciplinary therapy in a comprehensive inpatient rehab setting. Physiatrist is providing close team supervision and 24 hour management of active medical problems listed below. Physiatrist and rehab team continue to assess barriers to discharge/monitor patient progress toward functional and medical goals. FIM: FIM - Bathing Bathing Steps Patient Completed: Chest;Right Arm;Left Arm;Abdomen;Front perineal area;Buttocks;Left upper leg;Right upper leg Bathing: 4: Min-Patient completes 8-9 38f 10 parts or 75+ percent  FIM - Upper Body Dressing/Undressing Upper body dressing/undressing steps patient completed: Thread/unthread right sleeve of pullover shirt/dresss;Thread/unthread left sleeve of pullover shirt/dress;Pull shirt over trunk;Put head through opening of pull over shirt/dress Upper body dressing/undressing: 5: Set-up assist to: Obtain clothing/put away FIM - Lower Body Dressing/Undressing Lower body dressing/undressing steps patient completed: Thread/unthread right pants leg;Thread/unthread left pants leg;Pull pants up/down (uses reacher) Lower body dressing/undressing: 4: Min-Patient completed 75 plus % of tasks  FIM - Toileting Toileting steps completed by patient: Adjust clothing prior to toileting;Performs perineal hygiene Toileting Assistive Devices: Grab bar or rail for support Toileting: 5: Supervision: Safety issues/verbal cues  FIM - Diplomatic Services operational officer Devices: Therapist, music Transfers: 3-To toilet/BSC: Mod A (lift or lower assist)  FIM - Banker Devices: Arm rests Bed/Chair Transfer: 4: Bed > Chair or W/C: Min A (steadying Pt. > 75%);4: Chair or W/C > Bed: Min A (steadying Pt. > 75%)  FIM - Locomotion: Wheelchair Distance: 150 Locomotion: Wheelchair: 1: Total Assistance/staff pushes wheelchair (Pt<25%) FIM - Locomotion:  Ambulation Ambulation/Gait Assistance: Not tested (comment) Locomotion: Ambulation: 0: Activity did not occur  Comprehension Comprehension Mode: Auditory Comprehension: 5-Understands basic 90% of the time/requires cueing < 10% of the time  Expression Expression Mode: Verbal Expression: 5-Expresses basic 90% of the time/requires cueing < 10% of the time.  Social Interaction Social Interaction Mode: Asleep Social Interaction: 6-Interacts appropriately with others with medication  or extra time (anti-anxiety, antidepressant).  Problem Solving Problem Solving Mode: Asleep Problem Solving: 5-Solves basic 90% of the time/requires cueing < 10% of the time  Memory Memory Mode: Asleep Memory: 5-Recognizes or recalls 90% of the time/requires cueing < 10% of the time  Medical Problem List and Plan:  1. 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 DVT Prophylaxis/Anticoagulation: Chronic Coumadin therapy for history of PAF/ICD. Continue Lovenox for INR greater than 2.00. Monitor for a bleeding episodes.  3. Pain Management: Percocet, Robaxin as needed. Monitor with increased mobility  4. Neuropsych: This patient Is capable of making decisions on his/her own behalf.  5. Postoperative anemia. Latest hemoglobin stable at 8.3. Followup CBC. Patient currently asymptomatic  6. Stage II pressure ulcer right heel. Continue Prevalon boot. Followup per wound care nurse  7. Hypertension. Isordil 20 mg 3 times a day, hydralazine 25 mg 3 times a day, Lasix 80 mg daily, coreg 25 mg twice a day.will decrease coreg dose, hold this am due to brady Monitor with increased mobility  8. Chronic systolic congestive heart failure. reduce Lasix 40 mg daily and monitor for any signs of fluid overload  9. Diabetes mellitus with peripheral neuropathy. Latest hemoglobin A1c 8.4. Lantus insulin will adjust Patient was taking 10 units Lantus insulin twice daily prior to admission. Check blood sugars a.c.  and at bedtime  10. Hypothyroidism. Synthroid  11. Hyperlipidemia. Zocor  12. History of gout. Allopurinol. Monitor for any signs of flare up  13. Chronic renal insufficiency. Baseline creatinine 2.33. Followup chemistries back at baseline but was down in 1-2 range.  Will reduce lasix stop allopurinol, monitor for gout sx 14. Coronary artery disease with CABG/ICD. No chest pain or shortness of breath. Followup cardiology services as needed  15.  Urinary retention improved probably multifactorial immobility and diabetic cystopathy +/- BPH, wean urecholine to TID, on flomax 16.  Bradycardia have reduced coreg to lowest dose but still ~50 will D/C and monitor  LOS (Days) 11 A FACE TO FACE EVALUATION WAS PERFORMED  Jorgina Binning E 12/22/2012, 8:55 AM

## 2012-12-22 NOTE — Plan of Care (Signed)
Problem: RH BLADDER ELIMINATION Goal: RH STG MANAGE BLADDER WITH MEDICATION WITH ASSISTANCE STG Manage Bladder With Medication With Assistance. Min assist  Outcome: Progressing Urecholine 25 mg po TID

## 2012-12-22 NOTE — Progress Notes (Signed)
Patient LBM 12/19/2012. Senokot given as per .scheduled. Offered Sorbitol patient refused. Patient refused snacks.Attempted 3 times.Call bell within reach. Will continue to monitor.

## 2012-12-22 NOTE — Progress Notes (Signed)
Occupational Therapy Session Note  Patient Details  Name: [name] MRN: [id number] Date of Birth: 07/15/1935  Today's Date: 12/22/2012 Time: 5621-3086 Time Calculation (min): 29 min  Short Term Goals: Week 1:  OT Short Term Goal 1 (Week 1): Pt will transfer to the [facility] with supervision. OT Short Term Goal 1 - Progress (Week 1): Progressing toward goal OT Short Term Goal 2 (Week 1): Pt will don pants with supervision. OT Short Term Goal 2 - Progress (Week 1): Progressing toward goal OT Short Term Goal 3 (Week 1): Pt will toilet with supervision. OT Short Term Goal 3 - Progress (Week 1): Progressing toward goal  Skilled Therapeutic Interventions/Progress Updates:    Pt worked on toilet transfers to drop arm commode squat pivot from wheelchair.  Overall needs mod facilitation for transfer onto the commode to the right side and max assist to transfer back to the left.  Also performed one sit to stand using the RW with total assist to check pt for bowel incontinence.  Pt unable to unweight his RLE in standing in order to make a stand pivot transfer.  Focused on UE strengthening exercises for the second part of session, sitting in the wheelchair.  Used orange theraband to perform bilateral shoulder flexion, elbow extension, chest press, horizontal abduction, and elbow flexion for 1 set of 12 repetitions and min assist for correct form.  Therapy Documentation Precautions:  Precautions Precautions: Fall Precaution Comments: hx of falls Restrictions Weight Bearing Restrictions: Yes LLE Weight Bearing: Non weight bearing  Pain: No report of pain during session when asked.  See FIM for current functional status  Therapy/Group: Individual Therapy  Kyanna Mahrt 12/22/2012, 3:58 PM

## 2012-12-22 NOTE — Plan of Care (Signed)
Problem: RH SKIN INTEGRITY Goal: RH STG ABLE TO PERFORM INCISION/WOUND CARE W/ASSISTANCE STG Able To Perform Incision/Wound Care With Assistance. Moderate assist  Outcome: Progressing Patient requires total assist for dressing change, he will hold residual limb up for dressing change

## 2012-12-23 ENCOUNTER — Inpatient Hospital Stay (HOSPITAL_COMMUNITY): Payer: Medicare Other

## 2012-12-23 ENCOUNTER — Inpatient Hospital Stay (HOSPITAL_COMMUNITY): Payer: MEDICARE

## 2012-12-23 ENCOUNTER — Inpatient Hospital Stay (HOSPITAL_COMMUNITY): Payer: Medicare Other | Admitting: Occupational Therapy

## 2012-12-23 ENCOUNTER — Encounter (HOSPITAL_COMMUNITY): Payer: Medicare Other | Admitting: Occupational Therapy

## 2012-12-23 DIAGNOSIS — S88119A Complete traumatic amputation at level between knee and ankle, unspecified lower leg, initial encounter: Secondary | ICD-10-CM

## 2012-12-23 DIAGNOSIS — L98499 Non-pressure chronic ulcer of skin of other sites with unspecified severity: Secondary | ICD-10-CM

## 2012-12-23 DIAGNOSIS — I739 Peripheral vascular disease, unspecified: Secondary | ICD-10-CM

## 2012-12-23 LAB — GLUCOSE, CAPILLARY
Glucose-Capillary: 144 mg/dL — ABNORMAL HIGH (ref 70–99)
Glucose-Capillary: 118 mg/dL — ABNORMAL HIGH (ref 70–99)
Glucose-Capillary: 185 mg/dL — ABNORMAL HIGH (ref 70–99)

## 2012-12-23 LAB — PROTIME-INR
INR: 2.25 — ABNORMAL HIGH (ref 0.00–1.49)
Prothrombin Time: 23.9 seconds — ABNORMAL HIGH (ref 11.6–15.2)

## 2012-12-23 MED ORDER — INSULIN GLARGINE 100 UNIT/ML ~~LOC~~ SOLN
18.0000 [IU] | Freq: Two times a day (BID) | SUBCUTANEOUS | Status: DC
  Administered 2012-12-23 – 2012-12-24 (×2): 18 [IU] via SUBCUTANEOUS

## 2012-12-23 MED ORDER — WARFARIN SODIUM 2.5 MG PO TABS
2.5000 mg | ORAL_TABLET | Freq: Once | ORAL | Status: AC
  Administered 2012-12-23: 2.5 mg via ORAL
  Filled 2012-12-23: qty 1

## 2012-12-23 MED ORDER — BETHANECHOL CHLORIDE 25 MG PO TABS
25.0000 mg | ORAL_TABLET | Freq: Two times a day (BID) | ORAL | Status: DC
  Administered 2012-12-23 – 2012-12-24 (×2): 25 mg via ORAL
  Filled 2012-12-23 (×4): qty 1

## 2012-12-23 NOTE — Discharge Summary (Signed)
[name], [name]               ACCOUNT NO.:  [id number]  MEDICAL RECORD NO.:  [id number]  LOCATION:  4144                         FACILITY:  [facility]  PHYSICIAN:  Erick Colace, M.D.DATE OF BIRTH:  [birth year]  DATE OF ADMISSION:  12/11/2012 DATE OF DISCHARGE:  12/24/2012                              DISCHARGE SUMMARY   DISCHARGE DIAGNOSES: 1. Left below-knee amputation secondary to ischemic nonhealing ulcers     December 08, 2012. 2. Chronic Coumadin therapy for atrial fibrillation. 3. Pain management. 4. Postoperative anemia. 5. Stage II pressure ulcer, right heel. 6. Hypertension. 7. Chronic systolic congestive heart failure. 8. Diabetes mellitus. 9. Peripheral neuropathy. 10.Hypothyroidism. 11.Hyperlipidemia. 12.History of gout. 13.Chronic renal insufficiency with baseline creatinine of 2.33. 14.Coronary artery disease with coronary artery bypass grafting. 15.Urinary retention, resolving.  HISTORY OF PRESENT ILLNESS:  This is a 78 year old male with diabetes mellitus, coronary artery disease, on chronic Coumadin therapy for atrial fibrillation, who was admitted on November 24, 2012, with progressive nonhealing ulcers of left lower extremity.  Angiography revealed moderate stenosis left SFA and underwent angioplasty.  The patient's chronic Coumadin was resumed.  No change in wound.  Plan was for BKA once INR stabilized where she underwent left below-knee amputation December 08, 2012, per Dr. Ophelia Charter.  Chronic Coumadin resumed with Lovenox until INR greater than 2.00.  Hospital course anemia 8.3 and monitored.  Stage II pressure ulcer right heel with followup wound care nurse and Prevalon boot in place.  Physical and occupational therapy ongoing.  The patient was admitted for comprehensive rehab program.  PAST MEDICAL HISTORY:  See discharge diagnoses.  SOCIAL HISTORY:  Lives alone, 1 level home, 9 steps to entry.  FUNCTIONAL HISTORY:  Prior to admission was  independent, driving, retired.  FUNCTIONAL STATUS:  Upon admission to rehab services was +2 total assist, ambulate 15 feet with a rolling walker.  PHYSICAL EXAMINATION:  VITAL SIGNS:  Blood pressure 121/84, pulse 72, temperature 97.9, respirations 18. GENERAL:  This was an alert male, oriented x3. EYES:  Pupils round and reactive to light. CARDIAC:  Rate controlled. LUNGS:  Clear to auscultation. CARDIAC:  Regular rate and rhythm. EXTREMITIES:  Left below-knee amputation site with good shrinkage.  Dry dressing in place.  Right heel with a boot.  Wound on the medial lower leg ankle and heel, chronic vascular changes.  REHABILITATION HOSPITAL COURSE:  The patient was admitted to inpatient rehab services with therapies initiated on a 3-hour daily basis consisting of physical therapy, occupational therapy, and rehabilitation nursing.  The following issues were addressed during the patient's rehabilitation stay.  Pertaining to [name] left below-knee amputation December 08, 2012, remained stable.  He would follow up with Dr. Annell Greening as well as follow up in our outpatient Amputee Office. He remained on chronic Coumadin for atrial fibrillation with cardiac rate controlled.  Lovenox was use until INR greater than 2.00.  Latest INR of 2.25.  Pain management with the use of Neurontin 100 mg 3 times daily as well as Percocet with good results.  Postoperative anemia stable with no bleeding episodes.  The patient did have a stage II pressure ulcer right heel wearing a Prevalon boot.  Noted  ischemic changes to right lower extremity.  Blood pressures remained well controlled with no orthostatic changes.  He exhibited no signs of fluid overload as he remained on Lasix 40 mg daily.  Chronic renal insufficiency with baseline creatinine of 2.33 and latest creatinine of 2.02 and monitored.  Bouts of urinary retention, maintained on Flomax with noted history of benign prostatic hypertrophy.  He  was weaned from his Urecholine.  He did have a history of coronary artery disease and bypass grafting with no chest pain or shortness of breath.  The patient received weekly collaborative interdisciplinary team conferences to discuss estimated length of stay, family teaching, and any barriers to discharge.  He was continent of bowel and bladder, moderate assist lower body dressing, close supervision toileting on a wide-base bedside commode, minimal assist bathing, minimal assist transfers with scooting, supervision bed mobility, and sliding board transfers to level surfaces. The patient with slow progressive gains and limited assistance at home, it was felt skilled nursing facility was needed with bed becoming available December 24, 2012, and discharge taking place at that time.  DISCHARGE MEDICATIONS:  At the time of dictation included: 1. Robaxin 500 mg p.o. every 6 hours as needed muscle spasms. 2. Percocet 5-325 mg 1 or 2 tablets every 6 hours as needed pain. 3. Urecholine 25 mg p.o. b.i.d. x1 week and stop. 4. Lasix 40 mg p.o. daily. 5. Neurontin 100 mg p.o. t.i.d. 6. Hydralazine 25 mg p.o. t.i.d. 7. Lantus insulin 18 units subcutaneously twice daily. 8. Isordil 20 mg p.o. t.i.d. 9. Synthroid 50 mcg p.o. daily. 10.Protonix 40 mg p.o. daily. 11.MiraLax 17 g with 8 ounces of water p.o. daily. 12.Senokot-S 2 tablets 2 p.o. b.i.d. 13.Zocor 40 mg p.o. at bedtime. 14.Flomax 0.4 mg p.o. at bedtime. 15.Coumadin 5 mg p.o. daily adjusted accordingly for an INR of 2.0 to     3.0.  DIET:  1800 calorie ADA diet.  SPECIAL INSTRUCTIONS:  The patient should continue to receive weekly INR checks while on chronic Coumadin therapy with a goal INR of 2.0 to 3.0, Prevalon boot to right lower extremity for heel ulcer and monitor closely.  The patient should follow up with Dr. Annell Greening, Orthopedic Services in 2 weeks.  Please call for appointment in regards to wound care.  Follow up Dr. Claudette Laws at the outpatient rehab center on January 06, 2013, arrive at 10:40 a.m.     Mariam Dollar, P.A.   ______________________________ Erick Colace, M.D.    DA/MEDQ  D:  12/23/2012  T:  12/23/2012  Job:  [id number]  cc:   Corrie Mckusick, M.D. Veverly Fells. Ophelia Charter, M.D.

## 2012-12-23 NOTE — Progress Notes (Signed)
Physical Therapy Note  Patient Details  Name: [name] MRN: [id number] Date of Birth: [birth year] Today's Date: 12/23/2012  Patient missed 60 minute session due to diarrhea. Patient declined to participate stating "every time I move, it comes out like water."   Alma Friendly 12/23/2012, 3:55 PM

## 2012-12-23 NOTE — Progress Notes (Signed)
Occupational Therapy Session Note  Patient Details  Name: [name] MRN: [id number] Date of Birth: 16-Mar-1935  Today's Date: 12/23/2012 Time: Pt scheduled for OT 1000-1130 but missed entire session to diarrhea and pt declined to participate.  Short Term Goals: Week 1:  OT Short Term Goal 1 (Week 1): Pt will transfer to the [facility] with supervision. OT Short Term Goal 1 - Progress (Week 1): Progressing toward goal OT Short Term Goal 2 (Week 1): Pt will don pants with supervision. OT Short Term Goal 2 - Progress (Week 1): Progressing toward goal OT Short Term Goal 3 (Week 1): Pt will toilet with supervision. OT Short Term Goal 3 - Progress (Week 1): Progressing toward goal Week 2:  OT Short Term Goal 1 (Week 2): Pt will don pants including right sock and shoe with min assist. OT Short Term Goal 2 (Week 2): Pt will toilet on drop arm BSC consistently with min assist. OT Short Term Goal 3 (Week 2): Pt will consistently transfer to [facility] with supervision.  Skilled Therapeutic Interventions/Progress Updates:     Pt was not able to participate this am as he was feeling ill.  Pt will be D/C to a SNF tomorrow afternoon, so today's session will be rescheduled for 8am tomorrow am.  Therapy Documentation Precautions:  Precautions Precautions: Fall Precaution Comments: hx of falls Restrictions Weight Bearing Restrictions: Yes LLE Weight Bearing: Non weight bearing  Pain: Pain Assessment Pain Assessment: 0-10 Pain Score:   6 Pain Type: Surgical pain Pain Location: Leg Pain Orientation: Left Pain Descriptors: Aching Pain Frequency: Intermittent Pain Onset: Gradual Patients Stated Pain Goal: 3 Pain Intervention(s): Medication (See eMAR);Repositioned Multiple Pain Sites: No ADL:  See FIM for current functional status  Therapy/Group: Individual Therapy  Davonta Stroot 12/23/2012, 11:01 AM

## 2012-12-23 NOTE — Discharge Summary (Signed)
  Discharge summary job [id number]

## 2012-12-23 NOTE — Progress Notes (Signed)
Patient ID: [name], male   DOB: 1935-06-02, 78 y.o.   MRN: [id number] Subjective/Complaints: Voiding with PVR + constipation No pain issues, no dizziness No signs of bleeding Review of Systems  Gastrointestinal: Positive for constipation.  Musculoskeletal: Positive for joint pain.  Psychiatric/Behavioral: The patient is nervous/anxious.   All other systems reviewed and are negative.    Objective: Vital Signs: Blood pressure 132/60, pulse 59, temperature 98.4 F (36.9 C), temperature source Oral, resp. rate 17, height 6' 2.02" (1.88 m), weight 98.1 kg (216 lb 4.3 oz), SpO2 96.00%. No results found. Results for orders placed during the hospital encounter of 12/11/12 (from the past 72 hour(s))  GLUCOSE, CAPILLARY     Status: Abnormal   Collection Time   12/20/12 11:52 AM      Component Value Range Comment   Glucose-Capillary 192 (*) 70 - 99 mg/dL    Comment 1 Notify RN     GLUCOSE, CAPILLARY     Status: Abnormal   Collection Time   12/20/12  4:48 PM      Component Value Range Comment   Glucose-Capillary 219 (*) 70 - 99 mg/dL    Comment 1 Notify RN     GLUCOSE, CAPILLARY     Status: Abnormal   Collection Time   12/20/12  8:15 PM      Component Value Range Comment   Glucose-Capillary 222 (*) 70 - 99 mg/dL    Comment 1 Notify RN     PROTIME-INR     Status: Abnormal   Collection Time   12/21/12  5:59 AM      Component Value Range Comment   Prothrombin Time 21.9 (*) 11.6 - 15.2 seconds    INR 2.00 (*) 0.00 - 1.49   GLUCOSE, CAPILLARY     Status: Abnormal   Collection Time   12/21/12  7:36 AM      Component Value Range Comment   Glucose-Capillary 120 (*) 70 - 99 mg/dL    Comment 1 Notify RN     GLUCOSE, CAPILLARY     Status: Abnormal   Collection Time   12/21/12 11:59 AM      Component Value Range Comment   Glucose-Capillary 136 (*) 70 - 99 mg/dL    Comment 1 Notify RN     GLUCOSE, CAPILLARY     Status: Abnormal   Collection Time   12/21/12  4:38 PM   Component Value Range Comment   Glucose-Capillary 194 (*) 70 - 99 mg/dL    Comment 1 Notify RN     GLUCOSE, CAPILLARY     Status: Abnormal   Collection Time   12/21/12  9:16 PM      Component Value Range Comment   Glucose-Capillary 196 (*) 70 - 99 mg/dL    Comment 1 Notify RN      Comment 2 Documented in Chart     PROTIME-INR     Status: Abnormal   Collection Time   12/22/12  5:05 AM      Component Value Range Comment   Prothrombin Time 23.4 (*) 11.6 - 15.2 seconds    INR 2.19 (*) 0.00 - 1.49   GLUCOSE, CAPILLARY     Status: Abnormal   Collection Time   12/22/12  7:23 AM      Component Value Range Comment   Glucose-Capillary 135 (*) 70 - 99 mg/dL    Comment 1 Notify RN     GLUCOSE, CAPILLARY     Status: Abnormal  Collection Time   12/22/12 11:27 AM      Component Value Range Comment   Glucose-Capillary 198 (*) 70 - 99 mg/dL    Comment 1 Notify RN     GLUCOSE, CAPILLARY     Status: Abnormal   Collection Time   12/22/12  4:46 PM      Component Value Range Comment   Glucose-Capillary 160 (*) 70 - 99 mg/dL   GLUCOSE, CAPILLARY     Status: Abnormal   Collection Time   12/22/12  8:19 PM      Component Value Range Comment   Glucose-Capillary 179 (*) 70 - 99 mg/dL   PROTIME-INR     Status: Abnormal   Collection Time   12/23/12  7:00 AM      Component Value Range Comment   Prothrombin Time 23.9 (*) 11.6 - 15.2 seconds    INR 2.25 (*) 0.00 - 1.49   GLUCOSE, CAPILLARY     Status: Abnormal   Collection Time   12/23/12  7:03 AM      Component Value Range Comment   Glucose-Capillary 144 (*) 70 - 99 mg/dL    Comment 1 Notify RN        HEENT: normal Cardio: RRR Resp: CTA B/L GI: BS positive and Distention Extremity:  Edema Left stump Skin:    Neuro: Alert/Oriented, Anxious and Abnormal Sensory reduced sensory R foot Musc/Skel:  Other charcot deformity R ankle Gen NAD Mood/affect appropriate  Assessment/Plan: 1. Functional deficits secondary to L BKA which require 3+ hours per  day of interdisciplinary therapy in a comprehensive inpatient rehab setting. Physiatrist is providing close team supervision and 24 hour management of active medical problems listed below. Physiatrist and rehab team continue to assess barriers to discharge/monitor patient progress toward functional and medical goals. FIM: FIM - Bathing Bathing Steps Patient Completed: Chest;Right Arm;Left Arm;Abdomen;Front perineal area;Buttocks;Left upper leg;Right upper leg Bathing: 4: Min-Patient completes 8-9 35f 10 parts or 75+ percent  FIM - Upper Body Dressing/Undressing Upper body dressing/undressing steps patient completed: Thread/unthread right sleeve of pullover shirt/dresss;Thread/unthread left sleeve of pullover shirt/dress;Pull shirt over trunk;Put head through opening of pull over shirt/dress Upper body dressing/undressing: 5: Set-up assist to: Obtain clothing/put away FIM - Lower Body Dressing/Undressing Lower body dressing/undressing steps patient completed: Thread/unthread right pants leg;Thread/unthread left pants leg;Pull pants up/down (uses reacher) Lower body dressing/undressing: 4: Min-Patient completed 75 plus % of tasks  FIM - Toileting Toileting steps completed by patient: Adjust clothing prior to toileting;Performs perineal hygiene Toileting Assistive Devices: Grab bar or rail for support Toileting: 5: Supervision: Safety issues/verbal cues  FIM - Diplomatic Services operational officer Devices: Bedside commode Toilet Transfers: 3-To toilet/BSC: Mod A (lift or lower assist);3-From toilet/BSC: Mod A (lift or lower assist)  FIM - Banker Devices: Sliding board Bed/Chair Transfer: 5: Bed > Chair or W/C: Supervision (verbal cues/safety issues)  FIM - Locomotion: Wheelchair Distance: 150 Locomotion: Wheelchair: 2: Travels 50 - 149 ft with supervision, cueing or coaxing FIM - Locomotion: Ambulation Ambulation/Gait Assistance: Not tested  (comment) Locomotion: Ambulation: 0: Activity did not occur  Comprehension Comprehension Mode: Auditory Comprehension: 5-Follows basic conversation/direction: With extra time/assistive device  Expression Expression Mode: Verbal Expression: 5-Expresses basic 90% of the time/requires cueing < 10% of the time.  Social Interaction Social Interaction Mode: Asleep Social Interaction: 6-Interacts appropriately with others with medication or extra time (anti-anxiety, antidepressant).  Problem Solving Problem Solving Mode: Asleep Problem Solving: 5-Solves basic 90% of the time/requires cueing <  10% of the time  Memory Memory Mode: Asleep Memory: 5-Recognizes or recalls 90% of the time/requires cueing < 10% of the time  Medical Problem List and Plan:  1. 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 DVT Prophylaxis/Anticoagulation: Chronic Coumadin therapy for history of PAF/ICD. Continue Lovenox for INR greater than 2.00. Monitor for a bleeding episodes.  3. Pain Management: Percocet, Robaxin as needed. Monitor with increased mobility  4. Neuropsych: This patient Is capable of making decisions on his/her own behalf.  5. Postoperative anemia. Latest hemoglobin stable at 8.3. Followup CBC. Patient currently asymptomatic  6. Stage II pressure ulcer right heel. Continue Prevalon boot. Followup per wound care nurse  7. Hypertension. Isordil 20 mg 3 times a day, hydralazine 25 mg 3 times a day, Lasix 80 mg daily, coreg 25 mg twice a day.will decrease coreg dose, hold this am due to brady Monitor with increased mobility  8. Chronic systolic congestive heart failure. reduce Lasix 40 mg daily and monitor for any signs of fluid overload  9. Diabetes mellitus with peripheral neuropathy. Latest hemoglobin A1c 8.4. Lantus insulin will adjust Patient was taking 10 units Lantus insulin twice daily prior to admission. Check blood sugars a.c. and at bedtime  10. Hypothyroidism. Synthroid    11. Hyperlipidemia. Zocor  12. History of gout. Allopurinol. Monitor for any signs of flare up  13. Chronic renal insufficiency. Baseline creatinine 2.33. Followup chemistries back at baseline but was down in 1-2 range.  Will reduce lasix stop allopurinol, monitor for gout sx 14. Coronary artery disease with CABG/ICD. No chest pain or shortness of breath. Followup cardiology services as needed  15.  Urinary retention improved probably multifactorial immobility and diabetic cystopathy +/- BPH, wean urecholine to BID, on flomax 16.  Bradycardia have reduced coreg to lowest dose but still ~50 will D/C and monitor  LOS (Days) 12 A FACE TO FACE EVALUATION WAS PERFORMED  KIRSTEINS,ANDREW E 12/23/2012, 8:23 AM

## 2012-12-23 NOTE — Consult Note (Signed)
WOC follow up Wound type: HAPU Stage II pressure ulcer left heel Measurement: 3.0cm x 3.0cm x 0 Wound bed: serous filled blister, with some resolution of the serum, some reabsorption, mild fluctuance.    Drainage (amount, consistency, odor) none Periwound:inact Dressing procedure/placement/frequency: continue silicone foam dressings to protect, will need daily inspection at SNF to make sure that this area does not open. Will add to paint this area with betadine to encourage this area to reabsorb.  Darkening of the medial right malleolar region but not open, no induration currently will need close monitoring of this area to as I believe this to be related to PAD and could potentially be an issue for the pt should it open.   Re consult if needed, will not follow at this time. Thanks  Takira Sherrin [employer], CWOCN [phone])

## 2012-12-23 NOTE — Progress Notes (Signed)
Social Work Patient ID: [name], male   DOB: 11-15-1935, 78 y.o.   MRN: [id number] Spoke with [facility] who reports they will have a bed for pt tomorrow.  Informed pt and left message for daughter. Will work on transfer tomorrow, plan non-emergency ambulance at 1:00pm.  Gather paperwork for tomorrow.

## 2012-12-23 NOTE — Progress Notes (Signed)
Physical Therapy Note  Patient Details  Name: [name] MRN: [id number] Date of Birth: [birth year] Today's Date: 12/23/2012  8:05 - 8:55 50 Minutes Individual session Patient denies pain.  S: Patient seems frustrated this morning - complaining about inability to understand "foreign" nurse, hospital bill he received yesterday, and this being his last day here. Patient reports they just gave him a suppository and may need to go at any minute. Treatment focused on increasing independence with functional mobility. Patient eating breakfast upon entering room. Requested 5 minutes to finish. Patient needed assist to don diaper. Patient assisted with donning pants, right sock and shoe due to time. Patient modified independent with bed mobility using rails. Patient performed sliding board transfer with supervision to wheelchair. Patient required cueing to put on right foot rest. Patient propelled wheelchair 150 feet with bilateral UE's. Patient performed sliding board transfer to loveseat (about 3 inches different in height) independently going down and with min assist going up to steady wheelchair only. Patient performed sliding board transfer to Nustep with supervision. Patient exercised x 2.5 minutes on workload 5 before stating he needed to get to the bathroom. Patient transferred wheelchair to drop arm BSC over toilet with supervision. Patient given assist to manipulate pants due to urgency.  Patient left on [facility] and instructed to call nurse tech when done.   Arelia Longest M 12/23/2012, 8:51 AM

## 2012-12-23 NOTE — Progress Notes (Addendum)
ANTICOAGULATION CONSULT NOTE - Follow Up Consult  Pharmacy Consult for coumadin Indication: afib and PVD s/p ortho surgery  Allergies  Allergen Reactions  . Iodine Solution (Povidone Iodine) Rash    Topical only    Patient Measurements: Height: 6' 2.02" (188 cm) Weight: 216 lb 4.3 oz (98.1 kg) IBW/kg (Calculated) : 82.24  Heparin Dosing Weight:   Vital Signs: Temp: 98.4 F (36.9 C) (01/21 0500) Temp src: Oral (01/21 0500) BP: 132/60 mmHg (01/21 0500) Pulse Rate: 59  (01/21 0500)  Labs:  Basename 12/23/12 0700 12/22/12 0505 12/21/12 0559  HGB -- -- --  HCT -- -- --  PLT -- -- --  APTT -- -- --  LABPROT 23.9* 23.4* 21.9*  INR 2.25* 2.19* 2.00*  HEPARINUNFRC -- -- --  CREATININE -- -- --  CKTOTAL -- -- --  CKMB -- -- --  TROPONINI -- -- --    Estimated Creatinine Clearance: 36.7 ml/min (by C-G formula based on Cr of 2.02).   Medications:  Scheduled:    . bethanechol  25 mg Oral BID  . feeding supplement  237 mL Oral BID BM  . feeding supplement  30 mL Oral BID WC  . furosemide  40 mg Oral Daily  . gabapentin  100 mg Oral TID  . Gerhardt's butt cream   Topical TID  . hydrALAZINE  25 mg Oral TID  . insulin aspart  0-15 Units Subcutaneous TID WC  . insulin glargine  18 Units Subcutaneous BID  . isosorbide dinitrate  20 mg Oral TID  . levothyroxine  50 mcg Oral QAC breakfast  . pantoprazole  40 mg Oral Daily  . polyethylene glycol  17 g Oral Daily  . senna-docusate  2 tablet Oral BID  . simvastatin  40 mg Oral QHS  . Tamsulosin HCl  0.4 mg Oral QPC supper  . [COMPLETED] warfarin  5 mg Oral ONCE-1800  . Warfarin - Pharmacist Dosing Inpatient   Does not apply q1800  . [DISCONTINUED] bethanechol  25 mg Oral TID  . [DISCONTINUED] insulin glargine  16 Units Subcutaneous BID   Infusions:    Assessment: 78 yo male with afib and PVD is currently on therapeutic coumadin.  INR today was 2.25.  WIll be discharged tomorrow. Goal of Therapy:  INR 2-3    Plan:    1) Coumadin 2.5mg  po x1 today.  Recommend to continue coumadin 5mg  po qday, except 2.5mg  on Tuesdays and Fridays at discharge and have INR be rechecked by home health on 12/25/12 to reassess dosing.  Kase Shughart, [name] 12/23/2012,11:53 AM

## 2012-12-24 ENCOUNTER — Inpatient Hospital Stay (HOSPITAL_COMMUNITY): Payer: MEDICARE

## 2012-12-24 ENCOUNTER — Inpatient Hospital Stay (HOSPITAL_COMMUNITY): Payer: MEDICARE | Admitting: Occupational Therapy

## 2012-12-24 LAB — GLUCOSE, CAPILLARY

## 2012-12-24 LAB — PROTIME-INR: INR: 1.96 — ABNORMAL HIGH (ref 0.00–1.49)

## 2012-12-24 MED ORDER — HYDROCODONE-ACETAMINOPHEN 5-325 MG PO TABS
1.0000 | ORAL_TABLET | Freq: Four times a day (QID) | ORAL | Status: DC | PRN
  Administered 2012-12-24: 1 via ORAL
  Filled 2012-12-24: qty 1

## 2012-12-24 MED ORDER — BETHANECHOL CHLORIDE 10 MG PO TABS
10.0000 mg | ORAL_TABLET | Freq: Two times a day (BID) | ORAL | Status: DC
  Filled 2012-12-24 (×2): qty 1

## 2012-12-24 MED ORDER — WARFARIN SODIUM 5 MG PO TABS
5.0000 mg | ORAL_TABLET | Freq: Once | ORAL | Status: DC
  Filled 2012-12-24: qty 1

## 2012-12-24 NOTE — Progress Notes (Signed)
Patient ID: [name], male   DOB: 08/29/1935, 78 y.o.   MRN: [id number] Subjective/Complaints: No retention  No pain issues, no dizziness No signs of bleeding Review of Systems  Gastrointestinal: Positive for constipation.  Musculoskeletal: Positive for joint pain.  Psychiatric/Behavioral: The patient is nervous/anxious.   All other systems reviewed and are negative.    Objective: Vital Signs: Blood pressure 160/78, pulse 64, temperature 97.4 F (36.3 C), temperature source Oral, resp. rate 19, height 6' 2.02" (1.88 m), weight 98.1 kg (216 lb 4.3 oz), SpO2 95.00%. No results found. Results for orders placed during the hospital encounter of 12/11/12 (from the past 72 hour(s))  GLUCOSE, CAPILLARY     Status: Abnormal   Collection Time   12/21/12 11:59 AM      Component Value Range Comment   Glucose-Capillary 136 (*) 70 - 99 mg/dL    Comment 1 Notify RN     GLUCOSE, CAPILLARY     Status: Abnormal   Collection Time   12/21/12  4:38 PM      Component Value Range Comment   Glucose-Capillary 194 (*) 70 - 99 mg/dL    Comment 1 Notify RN     GLUCOSE, CAPILLARY     Status: Abnormal   Collection Time   12/21/12  9:16 PM      Component Value Range Comment   Glucose-Capillary 196 (*) 70 - 99 mg/dL    Comment 1 Notify RN      Comment 2 Documented in Chart     PROTIME-INR     Status: Abnormal   Collection Time   12/22/12  5:05 AM      Component Value Range Comment   Prothrombin Time 23.4 (*) 11.6 - 15.2 seconds    INR 2.19 (*) 0.00 - 1.49   GLUCOSE, CAPILLARY     Status: Abnormal   Collection Time   12/22/12  7:23 AM      Component Value Range Comment   Glucose-Capillary 135 (*) 70 - 99 mg/dL    Comment 1 Notify RN     GLUCOSE, CAPILLARY     Status: Abnormal   Collection Time   12/22/12 11:27 AM      Component Value Range Comment   Glucose-Capillary 198 (*) 70 - 99 mg/dL    Comment 1 Notify RN     GLUCOSE, CAPILLARY     Status: Abnormal   Collection Time   12/22/12  4:46 PM        Component Value Range Comment   Glucose-Capillary 160 (*) 70 - 99 mg/dL   GLUCOSE, CAPILLARY     Status: Abnormal   Collection Time   12/22/12  8:19 PM      Component Value Range Comment   Glucose-Capillary 179 (*) 70 - 99 mg/dL   PROTIME-INR     Status: Abnormal   Collection Time   12/23/12  7:00 AM      Component Value Range Comment   Prothrombin Time 23.9 (*) 11.6 - 15.2 seconds    INR 2.25 (*) 0.00 - 1.49   GLUCOSE, CAPILLARY     Status: Abnormal   Collection Time   12/23/12  7:03 AM      Component Value Range Comment   Glucose-Capillary 144 (*) 70 - 99 mg/dL    Comment 1 Notify RN     GLUCOSE, CAPILLARY     Status: Abnormal   Collection Time   12/23/12 11:26 AM      Component Value Range  Comment   Glucose-Capillary 125 (*) 70 - 99 mg/dL    Comment 1 Notify RN     GLUCOSE, CAPILLARY     Status: Abnormal   Collection Time   12/23/12  4:40 PM      Component Value Range Comment   Glucose-Capillary 118 (*) 70 - 99 mg/dL    Comment 1 Notify RN     GLUCOSE, CAPILLARY     Status: Abnormal   Collection Time   12/23/12  8:27 PM      Component Value Range Comment   Glucose-Capillary 185 (*) 70 - 99 mg/dL    Comment 1 Notify RN     PROTIME-INR     Status: Abnormal   Collection Time   12/24/12  6:45 AM      Component Value Range Comment   Prothrombin Time 21.6 (*) 11.6 - 15.2 seconds    INR 1.96 (*) 0.00 - 1.49   GLUCOSE, CAPILLARY     Status: Normal   Collection Time   12/24/12  7:07 AM      Component Value Range Comment   Glucose-Capillary 89  70 - 99 mg/dL    Comment 1 Notify RN        HEENT: normal Cardio: RRR Resp: CTA B/L GI: BS positive and Distention Extremity:  Edema Left stump Skin:   Sutures in place Neuro: Alert/Oriented, Anxious and Abnormal Sensory reduced sensory R foot Musc/Skel:  Other charcot deformity R ankle Gen NAD Mood/affect appropriate  Assessment/Plan: 1. Functional deficits secondary to L BKA stable for D/C to SNF  Will need f/u with  Surgery for suture removal, PM&R f/u, primary at SNF FIM: FIM - Bathing Bathing Steps Patient Completed: Chest;Right Arm;Left Arm;Abdomen;Front perineal area;Buttocks;Right upper leg;Right lower leg (including foot);Left upper leg (9/9 parts) Bathing: 5: Supervision: Safety issues/verbal cues  FIM - Upper Body Dressing/Undressing Upper body dressing/undressing steps patient completed: Thread/unthread right sleeve of pullover shirt/dresss;Thread/unthread left sleeve of pullover shirt/dress;Pull shirt over trunk;Put head through opening of pull over shirt/dress Upper body dressing/undressing: 5: Set-up assist to: Obtain clothing/put away FIM - Lower Body Dressing/Undressing Lower body dressing/undressing steps patient completed: Thread/unthread right pants leg;Thread/unthread left pants leg;Pull pants up/down Lower body dressing/undressing: 4: Min-Patient completed 75 plus % of tasks  FIM - Toileting Toileting steps completed by patient: Adjust clothing prior to toileting;Performs perineal hygiene;Adjust clothing after toileting Toileting Assistive Devices: Grab bar or rail for support Toileting: 4: Steadying assist  FIM - Diplomatic Services operational officer Devices: Human resources officer Transfers: 5-From toilet/BSC: Supervision (verbal cues/safety issues);5-To toilet/BSC: Supervision (verbal cues/safety issues)  FIM - Banker Devices: Sliding board Bed/Chair Transfer: 5: Supine > Sit: Supervision (verbal cues/safety issues);5: Chair or W/C > Bed: Supervision (verbal cues/safety issues);5: Bed > Chair or W/C: Supervision (verbal cues/safety issues)  FIM - Locomotion: Wheelchair Distance: 150 Locomotion: Wheelchair: 2: Travels 50 - 149 ft with supervision, cueing or coaxing FIM - Locomotion: Ambulation Ambulation/Gait Assistance: Not tested (comment) Locomotion: Ambulation: 0: Activity did not  occur  Comprehension Comprehension Mode: Auditory Comprehension: 5-Follows basic conversation/direction: With no assist  Expression Expression Mode: Verbal Expression: 5-Expresses basic needs/ideas: With extra time/assistive device  Social Interaction Social Interaction Mode: Asleep Social Interaction: 6-Interacts appropriately with others with medication or extra time (anti-anxiety, antidepressant).  Problem Solving Problem Solving Mode: Asleep Problem Solving: 5-Solves basic 90% of the time/requires cueing < 10% of the time  Memory Memory Mode: Asleep Memory: 5-Recognizes or recalls 90% of the time/requires  cueing < 10% of the time  Medical Problem List and Plan:  1. 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 DVT Prophylaxis/Anticoagulation: Chronic Coumadin therapy for history of PAF/ICD. Continue Lovenox for INR greater than 2.00. Monitor for a bleeding episodes.  3. Pain Management: Percocet, Robaxin as needed. Monitor with increased mobility  4. Neuropsych: This patient Is capable of making decisions on his/her own behalf.  5. Postoperative anemia. Latest hemoglobin stable at 8.3. Followup CBC. Patient currently asymptomatic  6. Stage II pressure ulcer right heel. Continue Prevalon boot. Followup per wound care nurse  7. Hypertension. Isordil 20 mg 3 times a day, hydralazine 25 mg 3 times a day, Lasix 80 mg daily, coreg 25 mg twice a day.will decrease coreg dose, hold this am due to brady Monitor with increased mobility  8. Chronic systolic congestive heart failure. reduce Lasix 40 mg daily and monitor for any signs of fluid overload  9. Diabetes mellitus with peripheral neuropathy. Latest hemoglobin A1c 8.4. Lantus insulin will adjust Patient was taking 10 units Lantus insulin twice daily prior to admission. Check blood sugars a.c. and at bedtime  10. Hypothyroidism. Synthroid  11. Hyperlipidemia. Zocor  12. History of gout. Allopurinol. Monitor for any  signs of flare up  13. Chronic renal insufficiency. Baseline creatinine 2.33. Followup chemistries back at baseline but was down in 1-2 range.  Will reduce lasix stop allopurinol, monitor for gout sx 14. Coronary artery disease with CABG/ICD. No chest pain or shortness of breath. Followup cardiology services as needed  15.  Urinary retention improved probably multifactorial immobility and diabetic cystopathy +/- BPH, D/C urecholine , on flomax 16.  Bradycardia off coreg HR in 60s  LOS (Days) 13 A FACE TO FACE EVALUATION WAS PERFORMED  Margery Szostak E 12/24/2012, 9:15 AM

## 2012-12-24 NOTE — Plan of Care (Signed)
Problem: RH BOWEL ELIMINATION Goal: Bowel movement every 2 days Min assist  Outcome: Completed/Met Date Met:  12/24/12 LBM 12/24/12 per report

## 2012-12-24 NOTE — Progress Notes (Signed)
Occupational Therapy Discharge Summary  Patient Details  Name: [name] MRN: [id number] Date of Birth: Jul 09, 1935  Today's Date: 12/24/2012    Patient has met 4 of 4 long term goals due to improved activity tolerance and ability to compensate for deficits.  Patient to discharge at [facility] Assist level.  Patient's care partner unavailable to provide the necessary physical assistance at discharge.    Reasons goals not met: n/a  Recommendation:  Patient will benefit from ongoing skilled OT services in skilled nursing facility setting to continue to advance functional skills in the area of BADL.  Equipment: No equipment provided  Reasons for discharge: treatment goals met  Patient/family agrees with progress made and goals achieved: Yes  OT Discharge  Pain Pain Assessment Pain Assessment: No/denies pain ADL  Overall min assist with LB dressing, toileting: supervision bathing from EOB or drop arm commode Vision/Perception  Vision - History Patient Visual Report: No change from baseline Vision - Assessment Eye Alignment: Within Functional Limits Vision Assessment: Vision not tested Perception Perception: Within Functional Limits Praxis Praxis: Intact  Cognition Overall Cognitive Status: Appears within functional limits for tasks assessed Sensation Sensation Light Touch: Impaired by gross assessment Stereognosis: Impaired by gross assessment Hot/Cold: Appears Intact Proprioception: Not tested Coordination Gross Motor Movements are Fluid and Coordinated: Yes Fine Motor Movements are Fluid and Coordinated: No Motor  Motor Motor - Skilled Clinical Observations: generalized muscle weakness Mobility    Close supervision bed to [facility] or W/C to [facility] with sliding board or with scoot transfers     Balance Static Sitting Balance Static Sitting - Level of Assistance: 5: Stand by assistance Dynamic Sitting Balance Reach (Patient is able to reach ___ inches to right,  left, forward, back): SBA with dynamic sitting Extremity/Trunk Assessment RUE Assessment RUE Assessment: Within Functional Limits LUE Assessment LUE Assessment: Exceptions to [facility] (shoulder flexion 130)  See FIM for current functional status  SAGUIER,JULIA 12/24/2012, 8:42 AM

## 2012-12-24 NOTE — Progress Notes (Signed)
Physical Therapy Discharge Summary  Patient Details  Name: [name] MRN: [id number] Date of Birth: [birth year]  Today's Date: 12/24/2012 Time: 1000-1100 Time Calculation (min): 60 min  Patient has met 7 of 7 long term goals due to improved activity tolerance, improved balance and increased strength.  Patient to discharge at a wheelchair level Supervision to min assist.   Patient's family is unavailable to provide the necessary physical assistance at discharge.  Reasons goals not met: n/a Patient has demonstrated meeting all LTG's.  Recommendation:  Patient will benefit from ongoing skilled PT services in skilled nursing facility setting to continue to advance safe functional mobility, address ongoing impairments in strength, and minimize fall risk.  Equipment: No equipment provided due to discharge to skilled nursing facility  Reasons for discharge: treatment goals met and discharge from hospital  Patient/family agrees with progress made and goals achieved: Yes  PT Discharge Precautions/Restrictions Precautions Precautions: Fall Pain Pain Assessment Pain Assessment: 0-10 Pain Score:   6 Pain Type: Phantom pain Pain Location: Leg Pain Orientation: Left Pain Descriptors:  (comes and goes; not constant) Cognition Overall Cognitive Status: Appears within functional limits for tasks assessed Orientation Level: Oriented X4 Sensation Sensation Light Touch: Impaired by gross assessment Stereognosis: Not tested Hot/Cold: Not tested Proprioception: Not tested Coordination Gross Motor Movements are Fluid and Coordinated: Yes Fine Motor Movements are Fluid and Coordinated: No Motor  Motor Motor - Skilled Clinical Observations: generalized weakness  Mobility Bed Mobility Supine to Sit: 5: Supervision (pt min assist from mat due to fatigue day of discharge) Supine to Sit Details (indicate cue type and reason): Patient has demonstrated supine to sit on hospital bed, regular  double bed, and mat with supervision only multiple times. Patient required min assist on mat on day of discharge due to fatigue from bowel incontinence issues the previous day.  Sit to Supine: 5: Supervision Transfers Sit to Stand: 3: Mod assist Sit to Stand Details (indicate cue type and reason): Patient requires mod assist for sit to stand in parallel bars. Once standing, patient can maintian 3 minutes with supervision to minimal contact assist. Patient has demonstrated mod assist stand pivot transfer with rolling walker.   Stand to Sit: 4: Min assist Stand to Sit Details: Patient requires min assist to control descent into chair depending on height of surface. Locomotion  Ambulation Ambulation: No Gait Gait: No Stairs / Additional Locomotion Stairs: No Corporate treasurer: Yes Wheelchair Assistance: 5: Investment banker, operational: Both upper extremities Wheelchair Parts Management: Needs assistance Distance: 150 feet  Trunk/Postural Assessment  Cervical Assessment Cervical Assessment: Exceptions to [facility] Cervical AROM Overall Cervical AROM: Other (comment) (forward head)  Balance Static Sitting Balance Static Sitting - Balance Support: No upper extremity supported Static Sitting - Level of Assistance: 7: Independent Dynamic Sitting Balance: supervision with dynamic sitting Static Standing Balance Static Standing - Balance Support: Bilateral upper extremity supported Static Standing - Level of Assistance: 3: Mod assist Static Standing - Comment/# of Minutes: Patient mod assist sit to stand. patient stood in parallel bars for 3 minutes with supervision to minimal contact assist.  Extremity Assessment  RUE Assessment RUE Assessment: Exceptions to [facility] RUE Strength RUE Overall Strength: Deficits;Due to premorbid status RUE Overall Strength Comments: shoulder flex 3-/5; elbow flex 4/5; elbow ext 4-/5 LUE Assessment LUE Assessment: Exceptions to [facility] LUE  Strength LUE Overall Strength: Deficits;Due to premorbid status LUE Overall Strength Comments: shoulder flex actively to about 90 degrees; elbow flex 4/5; elbow ext 4-/5 RLE Strength  RLE Overall Strength: Deficits RLE Overall Strength Comments: Hip flexion 3+/5, knee extension 4/5, Knee flexion 4/5. dorsiflexion 3+/5  LLE AROM (degrees) Left Knee Extension: -5  (active in sitting) Left Knee Flexion: 92  LLE Strength LLE Overall Strength: Deficits LLE Overall Strength Comments: hip flexion 4/5, knee flexion/extension 3+/5  See FIM for current functional status 10:00 - 11:00 60 minutes Individual session Patient denies pain overall. He reports phantom pain that comes and goes. S: patient reports that he is wiped out from yesterday where he spent the most of the day using the toilet.   Treatment focused on increasing functional independence and assessing for discharge/grad day. Patient took 12 minutes to propel wheelchair 150 feet. Patient performed car transfer with min assist with set up to help remove and put back on footrests. Patient transferred wheelchair to mat with supervision. Reviewed HEP for LE. Patient required min assist for supine to sit due to fatigue. Patient sit to stand in parallel bars with mod assist and maintained standing for 3 minutes with supervision to minimal contact assist. Patient returned to room and reported having no more questions regarding discharge.  Arelia Longest M 12/24/2012, 11:47 AM

## 2012-12-24 NOTE — Plan of Care (Signed)
Problem: RH SAFETY Goal: RH STG DEMO UNDERSTANDING HOME SAFETY PRECAUTIONS Outcome: Not Applicable Date Met:  12/24/12 Pt to be discharges to SNF today

## 2012-12-24 NOTE — Progress Notes (Signed)
ANTICOAGULATION CONSULT NOTE - Follow Up Consult  Pharmacy Consult for coumadin Indication: afib and PVD s/p BKA  Allergies  Allergen Reactions  . Iodine Solution (Povidone Iodine) Rash    Topical only    Patient Measurements: Height: 6' 2.02" (188 cm) Weight: 216 lb 4.3 oz (98.1 kg) IBW/kg (Calculated) : 82.24  Heparin Dosing Weight:   Vital Signs: Temp: 97.4 F (36.3 C) (01/22 0626) Temp src: Oral (01/22 0626) BP: 160/78 mmHg (01/22 0626) Pulse Rate: 64  (01/22 0626)  Labs:  Alvira Philips 12/24/12 0645 12/23/12 0700 12/22/12 0505  HGB -- -- --  HCT -- -- --  PLT -- -- --  APTT -- -- --  LABPROT 21.6* 23.9* 23.4*  INR 1.96* 2.25* 2.19*  HEPARINUNFRC -- -- --  CREATININE -- -- --  CKTOTAL -- -- --  CKMB -- -- --  TROPONINI -- -- --    Estimated Creatinine Clearance: 36.7 ml/min (by C-G formula based on Cr of 2.02).   Medications:  Scheduled:    . bethanechol  10 mg Oral BID  . feeding supplement  237 mL Oral BID BM  . feeding supplement  30 mL Oral BID WC  . furosemide  40 mg Oral Daily  . gabapentin  100 mg Oral TID  . Gerhardt's butt cream   Topical TID  . hydrALAZINE  25 mg Oral TID  . insulin aspart  0-15 Units Subcutaneous TID WC  . insulin glargine  18 Units Subcutaneous BID  . isosorbide dinitrate  20 mg Oral TID  . levothyroxine  50 mcg Oral QAC breakfast  . pantoprazole  40 mg Oral Daily  . polyethylene glycol  17 g Oral Daily  . senna-docusate  2 tablet Oral BID  . simvastatin  40 mg Oral QHS  . Tamsulosin HCl  0.4 mg Oral QPC supper  . [COMPLETED] warfarin  2.5 mg Oral ONCE-1800  . Warfarin - Pharmacist Dosing Inpatient   Does not apply q1800  . [DISCONTINUED] bethanechol  25 mg Oral BID   Infusions:    Assessment: 78 yo male with afib and PVD s/p BKA is currently on subthearpeutic coumadin.  INR down from 2.25 to 1.96.  To be discharged today. Goal of Therapy:  INR 2-3    Plan:  1) Coumadin 5mg  po x1 before d/c. 2) Rec to continue  coumadin 5mg  po qday except 2.5mg  on Tue/Fri at discharge starting 12/25/12 and recheck PT/INR by home health to reassess dosing on 12/26/12   Elouise Divelbiss, [name] 12/24/2012,9:28 AM

## 2012-12-24 NOTE — Plan of Care (Signed)
Problem: RH SKIN INTEGRITY Goal: RH STG ABLE TO PERFORM INCISION/WOUND CARE W/ASSISTANCE STG Able To Perform Incision/Wound Care With Assistance.total assist  Outcome: Completed/Met Date Met:  12/24/12 Pt holds residual limb up for dressing changes however he does not attempt to wrap residual limb states" I can't"

## 2012-12-24 NOTE — Progress Notes (Signed)
Patient for discharge today to skilled nursing facility [facility]. Report called and given to [facility]. Discussed the patient's transport scheduled for around 1300 today. [name], Meg Niemeier Elon Jester

## 2012-12-24 NOTE — Progress Notes (Signed)
Patient picked up by EMS for transport to [facility]. #3 bags of personal items, patient information packet given to transporter. [name], [name] Elon Jester

## 2012-12-24 NOTE — Progress Notes (Signed)
Social Work Discharge Note Discharge Note  The overall goal for the admission was met for:   Discharge location: No-[facility]  Length of Stay: Yes-13 DAYS  Discharge activity level: Yes-MIN LEVEL  Home/community participation: Yes  Services provided included: MD, RD, PT, OT, RN, Pharmacy and SW  Financial Services: Medicare and Private Insurance: BCBS  Follow-up services arranged: Other: SHORT TERM NHP  Comments (or additional information):[facility] ASSISTING WITH HIS R[facility] AT HOME.  DAUGHTER TO SWITCH HOMES WITH PT AND NEEDS RAMP BUILD, TAKE TIME WILL GO HOME ONCE ALL IS READY  Patient/Family verbalized understanding of follow-up arrangements: Yes  Individual responsible for coordination of the follow-up plan: KIM-DAUGHTER  Confirmed correct DME delivered: Lucy Chris 12/24/2012    Lucy Chris

## 2012-12-24 NOTE — Progress Notes (Signed)
Occupational Therapy Session Note  Patient Details  Name: [name] MRN: [id number] Date of Birth: [birth year]  Today's Date: 12/24/2012 Time: 0800-0856 Time Calculation (min): 56 min  Short Term Goals: Week 1:  OT Short Term Goal 1 (Week 1): Pt will transfer to the [facility] with supervision. OT Short Term Goal 1 - Progress (Week 1): Progressing toward goal OT Short Term Goal 2 (Week 1): Pt will don pants with supervision. OT Short Term Goal 2 - Progress (Week 1): Progressing toward goal OT Short Term Goal 3 (Week 1): Pt will toilet with supervision. OT Short Term Goal 3 - Progress (Week 1): Progressing toward goal Week 2:  OT Short Term Goal 1 (Week 2): Pt will don pants including right sock and shoe with min assist. OT Short Term Goal 2 (Week 2): Pt will toilet on drop arm BSC consistently with min assist. OT Short Term Goal 3 (Week 2): Pt will consistently transfer to [facility] with supervision.  Skilled Therapeutic Interventions/Progress Updates:      Pt seen for BADL retraining of toileting, bathing, and dressing with a focus on lateral leans and scoot transfers and sliding board transfers.  Pt used a scoot transfer with supervision from bed to [facility] with supervision, completed B/D from [facility] and then transferred to w/c with supervision with SB.  Pt toileted with steady assist and needed assist to don right sock and tie right shoe.  Pt will be leaving for a SNF this afternoon.  Therapy Documentation Precautions:  Precautions Precautions: Fall Precaution Comments: hx of falls Restrictions Weight Bearing Restrictions: Yes LLE Weight Bearing: Non weight bearing  Pain: Pain Assessment Pain Assessment: No/denies pain ADL:  See FIM for current functional status  Therapy/Group: Individual Therapy  SAGUIER,JULIA 12/24/2012, 8:58 AM

## 2013-01-06 ENCOUNTER — Encounter: Payer: MEDICARE | Attending: Physical Medicine & Rehabilitation

## 2013-01-06 ENCOUNTER — Ambulatory Visit (HOSPITAL_BASED_OUTPATIENT_CLINIC_OR_DEPARTMENT_OTHER): Payer: Medicare Other | Admitting: Physical Medicine & Rehabilitation

## 2013-01-06 ENCOUNTER — Encounter: Payer: Self-pay | Admitting: Physical Medicine & Rehabilitation

## 2013-01-06 VITALS — BP 179/68 | HR 81 | Resp 16 | Ht 74.0 in | Wt 221.0 lb

## 2013-01-06 DIAGNOSIS — I739 Peripheral vascular disease, unspecified: Secondary | ICD-10-CM

## 2013-01-06 DIAGNOSIS — Z89519 Acquired absence of unspecified leg below knee: Secondary | ICD-10-CM

## 2013-01-06 DIAGNOSIS — S88119A Complete traumatic amputation at level between knee and ankle, unspecified lower leg, initial encounter: Secondary | ICD-10-CM | POA: Insufficient documentation

## 2013-01-06 DIAGNOSIS — Z4802 Encounter for removal of sutures: Secondary | ICD-10-CM | POA: Insufficient documentation

## 2013-01-06 DIAGNOSIS — M79609 Pain in unspecified limb: Secondary | ICD-10-CM | POA: Insufficient documentation

## 2013-01-06 NOTE — Progress Notes (Signed)
Subjective:    Patient ID: [name], male    DOB: [birth year], 78 y.o.   MRN: [id number] HISTORY OF PRESENT ILLNESS: This is a 78 year old male with diabetes  mellitus, coronary artery disease, on chronic Coumadin therapy for  atrial fibrillation, who was admitted on November 24, 2012, with  progressive nonhealing ulcers of left lower extremity. Angiography  revealed moderate stenosis left SFA and underwent angioplasty. The  patient's chronic Coumadin was resumed. No change in wound. Plan was  for BKA once INR stabilized where she underwent left below-knee  amputation December 08, 2012, per Dr. Ophelia Charter. Chronic Coumadin resumed  with Lovenox until INR greater than 2.00. Hospital course anemia 8.3  and monitored. Stage II pressure ulcer right heel with followup wound  care nurse and Prevalon boot in place.  HPI Staying at [facility] in [place]. Skilled nursing facility. Receiving OT and PT services. Has not followed up with orthopedics yet. Still has sutures in BKA on the left Pain Inventory Average Pain 10 Pain Right Now 2 My pain is intermittent, burning and stabbing  In the last 24 hours, has pain interfered with the following? General activity 10 Relation with others 10 Enjoyment of life 10 What TIME of day is your pain at its worst? night Sleep (in general) Good  Pain is worse with: walking and inactivity Pain improves with: therapy/exercise, pacing activities and medication Relief from Meds: 7  Mobility walk with assistance how many minutes can you walk? 1 ability to climb steps?  no do you drive?  no use a wheelchair needs help with transfers Do you have any goals in this area?  yes  Function retired I need assistance with the following:  dressing, bathing, toileting and household duties Do you have any goals in this area?  yes  Neuro/Psych bladder control problems bowel control problems weakness numbness tingling trouble walking anxiety  Prior  Studies Any changes since last visit?  no  Physicians involved in your care Any changes since last visit?  no   History reviewed. No pertinent family history. History   Social History  . Marital Status: Divorced    Spouse Name: N/A    Number of Children: N/A  . Years of Education: N/A   Social History Main Topics  . Smoking status: Former Smoker    Quit date: 11/05/2003  . Smokeless tobacco: Former Neurosurgeon  . Alcohol Use: No  . Drug Use: No  . Sexually Active: No   Other Topics Concern  . None   Social History Narrative  . None   Past Surgical History  Procedure Date  . Coronary artery bypass graft   . Cardiac catheterization   . Amputation 12/08/2012    Procedure: AMPUTATION BELOW KNEE;  Surgeon: Eldred Manges, MD;  Location: [facility] OR;  Service: Orthopedics;  Laterality: Left;   Past Medical History  Diagnosis Date  . HTN (hypertension)   . PVD (peripheral vascular disease)   . COPD (chronic obstructive pulmonary disease)   . Diabetes mellitus     IDDM  . S/P CABG x 6 2003    Dr Cornelius Moras, Myoview low risk 2009  . Gout   . Paroxysmal atrial fibrillation   . Hypothyroidism   . ICD (implantable cardiac defibrillator) discharge 5/11    MDT  . Chronic renal insufficiency, stage III (moderate)   . Chronic systolic CHF (congestive heart failure)     EF 25-30% echo 12/12  . S/P angioplasty, 11/27/12 -Lt below the knee popliteal  artery 11/28/2012   BP 179/68  Pulse 81  Resp 16  Ht 6\' 2"  (1.88 m)  Wt 221 lb (100.245 kg)  BMI 28.37 kg/m2  SpO2 99%     Review of Systems  Musculoskeletal: Positive for gait problem.  Neurological: Positive for weakness and numbness.  Psychiatric/Behavioral: Positive for dysphoric mood.  All other systems reviewed and are negative.       Objective:   Physical Exam  Well-healed left BKA incision. Some sutures are ingrown      Assessment & Plan:  1. Left BKA well-healed need suture removal. Given that some of the sutures are are  ingrown we'll do this today Instead of referring to orthopedics.Will need orthopedic followup. Not a good candidate for prosthetic usage given his cardiac problems as well as chronic kidney disease as well as diabetes with neuropathy. Return to [name] when necessary

## 2013-01-06 NOTE — Patient Instructions (Addendum)
Your sutures were removed they have been in for 4 weeks I recommend followup with Dr. Ophelia Charter from [facility] orthopedics

## 2013-01-20 ENCOUNTER — Inpatient Hospital Stay (HOSPITAL_COMMUNITY): Payer: Medicare Other | Admitting: Speech Pathology

## 2013-02-10 ENCOUNTER — Ambulatory Visit: Payer: Self-pay | Admitting: Cardiovascular Disease

## 2013-02-10 DIAGNOSIS — Z7901 Long term (current) use of anticoagulants: Secondary | ICD-10-CM

## 2013-02-10 DIAGNOSIS — I480 Paroxysmal atrial fibrillation: Secondary | ICD-10-CM

## 2013-02-18 ENCOUNTER — Other Ambulatory Visit: Payer: Self-pay

## 2013-02-18 DIAGNOSIS — L97909 Non-pressure chronic ulcer of unspecified part of unspecified lower leg with unspecified severity: Secondary | ICD-10-CM

## 2013-02-19 ENCOUNTER — Encounter: Payer: Self-pay | Admitting: Vascular Surgery

## 2013-02-19 ENCOUNTER — Other Ambulatory Visit: Payer: Self-pay

## 2013-02-19 DIAGNOSIS — L97909 Non-pressure chronic ulcer of unspecified part of unspecified lower leg with unspecified severity: Secondary | ICD-10-CM

## 2013-02-20 ENCOUNTER — Encounter: Payer: Self-pay | Admitting: Vascular Surgery

## 2013-02-20 ENCOUNTER — Encounter: Payer: Self-pay | Admitting: *Deleted

## 2013-02-20 ENCOUNTER — Ambulatory Visit (INDEPENDENT_AMBULATORY_CARE_PROVIDER_SITE_OTHER): Payer: Medicare Other | Admitting: Vascular Surgery

## 2013-02-20 ENCOUNTER — Encounter (INDEPENDENT_AMBULATORY_CARE_PROVIDER_SITE_OTHER): Payer: Medicare Other | Admitting: *Deleted

## 2013-02-20 ENCOUNTER — Encounter (INDEPENDENT_AMBULATORY_CARE_PROVIDER_SITE_OTHER): Payer: Medicare Other

## 2013-02-20 ENCOUNTER — Other Ambulatory Visit: Payer: Self-pay | Admitting: *Deleted

## 2013-02-20 VITALS — BP 124/78 | HR 91 | Resp 18 | Ht 74.0 in | Wt 180.0 lb

## 2013-02-20 DIAGNOSIS — L97909 Non-pressure chronic ulcer of unspecified part of unspecified lower leg with unspecified severity: Secondary | ICD-10-CM

## 2013-02-20 DIAGNOSIS — I999 Unspecified disorder of circulatory system: Secondary | ICD-10-CM

## 2013-02-20 DIAGNOSIS — I70229 Atherosclerosis of native arteries of extremities with rest pain, unspecified extremity: Secondary | ICD-10-CM | POA: Insufficient documentation

## 2013-02-20 DIAGNOSIS — I998 Other disorder of circulatory system: Secondary | ICD-10-CM

## 2013-02-20 NOTE — Progress Notes (Signed)
[facility] [place]  Referred by:  Corrie Mckusick, MD [address] [place], [place] [address]  Reason for referral: right heel ulcer  History of Present Illness  [name] is a 78 y.o. (Aug 24, 1935) male who presents with chief complaint: right heel ulcer.  The patient developed bilateral heel ulcers months ago, trigger unknown.  The patient has undergone multiple percutaneous interventions with Dr. Allyson Sabal.  Ultimately, those interventions failed.  The patient then required a L BKA performed by Dr. Ophelia Charter.  The patient claims right heel ulcer was present months ago.  He denies any fever or chills.  He denies any frank purulence or bleeding from the heel.  He recently was debrided at [facility].  Pain is described as minimal as the patient doesn't feel much in his R leg.  The patient is currently non-ambulatory and wheelchair bound.  The patient has no rest pain or intermittent claudication symptoms also.  Atherosclerotic risk factors include: diabetes and hypertension.  Past Medical History  Diagnosis Date  . HTN (hypertension)   . PVD (peripheral vascular disease)   . COPD (chronic obstructive pulmonary disease)   . Diabetes mellitus     IDDM  . S/P CABG x 6 2003    Dr Cornelius Moras, Myoview low risk 2009  . Gout   . Paroxysmal atrial fibrillation   . Hypothyroidism   . ICD (implantable cardiac defibrillator) discharge 5/11    MDT  . Chronic renal insufficiency, stage III (moderate)   . Chronic systolic CHF (congestive heart failure)     EF 25-30% echo 12/12  . S/P angioplasty, 11/27/12 -Lt below the knee popliteal artery 11/28/2012    Past Surgical History  Procedure Laterality Date  . Coronary artery bypass graft    . Cardiac catheterization    . Amputation  12/08/2012    Procedure: AMPUTATION BELOW KNEE;  Surgeon: Eldred Manges, MD;  Location: [facility] OR;  Service: Orthopedics;  Laterality: Left;    History   Social History  .  Marital Status: Divorced    Spouse Name: N/A    Number of Children: N/A  . Years of Education: N/A   Occupational History  . Not on file.   Social History Main Topics  . Smoking status: Former Smoker    Types: Cigarettes    Quit date: 11/05/2003  . Smokeless tobacco: Never Used  . Alcohol Use: No  . Drug Use: No  . Sexually Active: No   Other Topics Concern  . Not on file   Social History Narrative  . No narrative on file    Family History  Problem Relation Age of Onset  . Heart disease Father   . Cancer Brother     Current Outpatient Prescriptions on File Prior to Visit  Medication Sig Dispense Refill  . atorvastatin (LIPITOR) 20 MG tablet Take 20 mg by mouth daily.      . feeding supplement (GLUCERNA SHAKE) LIQD Take 237 mLs by mouth 2 (two) times daily between meals.  60 Can  10  . furosemide (LASIX) 80 MG tablet Take 80 mg by mouth daily.       Marland Kitchen gabapentin (NEURONTIN) 100 MG capsule Take 100 mg by mouth 3 (three) times daily.      . hydrALAZINE (APRESOLINE) 25 MG tablet Take 25 mg by mouth 3 (three) times daily.      . insulin aspart (NOVOLOG) 100 UNIT/ML injection Inject 10-15 Units into the  skin 3 (three) times daily before meals. Per sliding scale      . insulin glargine (LANTUS) 100 UNIT/ML injection Inject 12 Units into the skin 2 (two) times daily.  10 mL  11  . isosorbide dinitrate (ISORDIL) 20 MG tablet Take 20 mg by mouth 3 (three) times daily.      Marland Kitchen levothyroxine (SYNTHROID, LEVOTHROID) 50 MCG tablet Take 50 mcg by mouth every morning.       . methocarbamol (ROBAXIN) 500 MG tablet Take 1 tablet (500 mg total) by mouth every 6 (six) hours as needed.  120 tablet  5  . omeprazole (PRILOSEC) 20 MG capsule Take 40 mg by mouth every morning.       Marland Kitchen oxyCODONE-acetaminophen (PERCOCET/ROXICET) 5-325 MG per tablet Take 1-2 tablets by mouth every 4 (four) hours as needed.  30 tablet  0  . polyethylene glycol (MIRALAX / GLYCOLAX) packet Take 17 g by mouth 2 (two)  times daily.  14 each  5  . potassium chloride (K-DUR) 10 MEQ tablet Take 10 mEq by mouth daily.      . Tamsulosin HCl (FLOMAX) 0.4 MG CAPS Take by mouth.      . warfarin (COUMADIN) 5 MG tablet Take 2.5-5 mg by mouth every evening. Takes one tablet (5mg ) every day except on Tuesdays and Fridays, take one-half tablet (2.5mg )      . [DISCONTINUED] insulin glargine (LANTUS) 100 UNIT/ML injection Inject 10 Units into the skin 2 (two) times daily.  10 mL  10   No current facility-administered medications on file prior to visit.    Allergies  Allergen Reactions  . Iodine Solution (Povidone Iodine) Rash    Topical only    REVIEW OF SYSTEMS:  (Positives checked otherwise negative)  CARDIOVASCULAR:  [ ]  chest pain, [ ]  chest pressure, [ ]  palpitations, [x]  shortness of breath when laying flat, [ ]  shortness of breath with exertion,   [ ]  pain in feet when walking, [x]  pain in feet when laying flat, [ ]  history of blood clot in veins (DVT), [ ]  history of phlebitis, [x]  swelling in legs, [x]  varicose veins  PULMONARY:  [ ]  productive cough, [ ]  asthma, [ ]  wheezing  NEUROLOGIC:  [x]  weakness in arms or legs, [x]  numbness in arms or legs, [ ]  difficulty speaking or slurred speech, [ ]  temporary loss of vision in one eye, [ ]  dizziness  HEMATOLOGIC:  [ ]  bleeding problems, [ ]  problems with blood clotting too easily  MUSCULOSKEL:  [ ]  joint pain, [ ]  joint swelling  GASTROINTEST:  [ ]   Vomiting blood, [ ]   Blood in stool     GENITOURINARY:  [ ]   Burning with urination, [ ]   Blood in urine  PSYCHIATRIC:  [ ]  history of major depression  INTEGUMENTARY:  [ ]  rashes, [x]  ulcers  CONSTITUTIONAL:  [ ]  fever, [ ]  chills   Physical Examination  Filed Vitals:   02/20/13 1328  BP: 124/78  Pulse: 91  Resp: 18  Height: 6\' 2"  (1.88 m)  Weight: 180 lb (81.647 kg)   Body mass index is 23.1 kg/(m^2).  General: A&O x 3, WDWN  Head: [place]/AT  Ear/Nose/Throat: Hearing grossly intact, nares w/o  erythema or drainage, oropharynx w/o Erythema/Exudate  Eyes: PERRLA, EOMI  Neck: Supple, no nuchal rigidity, no palpable LAD  Pulmonary: Sym exp, good air movt, CTAB, no rales, rhonchi, & wheezing  Cardiac: irr, irr, no Murmurs, rubs or gallops  Vascular: Vessel Right Left  Radial Palpable Palpable  Ulnar Palpable Palpable  Brachial Palpable Palpable  Carotid Palpable, without bruit Palpable, without bruit  Aorta Not palpable N/A  Femoral Palpable Palpable  Popliteal Not palpable Not palpable  PT Not Palpable BKA  DP Not Palpable BKA   Gastrointestinal: soft, NTND, -G/R, - HSM, - masses, - CVAT B  Musculoskeletal: M/S 5/5 throughout , L BKA, R heel with serosang drainage, raw appearance without any granulation  Neurologic: CN 2-12 intact , Pain and light touch intact in extremities except greatly decreased sensation in R foot, Motor exam as listed above  Psychiatric: Judgment intact, Mood & affect appropriatefor pt's clinical situation  Dermatologic: See M/S exam for extremity exam, no rashes otherwise noted  Lymph : No Cervical, Axillary, or Inguinal lymphadenopathy   Non-Invasive Vascular Imaging  OSH RLE ABI (Date: 02/18/13)  RLE: [place], PT: monophasic,  DP: monophasic  RLE arterial duplex (Date: 02/20/13)  R EIA: 268 c/s  Diffuse calcific plaque through SFA: PSV 270 c/s with monophasic flow  No PT, AT or peroneal flow visualized    Outside Studies/Documentation 4 pages of outside documents were reviewed including: outside wound care note, ABI.  Medical Decision Making  [name] is a 78 y.o. male who presents with: RLE critical limb ischemia, s/p L BKA   Unfortunately, I suspect this patient has tibial occlusion and likely will need a R BKA but he would like an attempt at salvage.  I discussed with the patient the natural history of critical limb ischemia: 25% require amputation in one year, 50% are able to maintain their limbs in one year, and 25-30%  die in one year due to comorbidities.  Given the limb threatening status of this patient, I recommend an aggressive work up including proceeding with an: Aortogram, Right leg runoff and intervention. I discussed with the patient the nature of angiographic procedures, especially the limited patencies of any endovascular intervention. The patient is aware of that the risks of an angiographic procedure include but are not limited to: bleeding, infection, access site complications, embolization, rupture of treated vessel, dissection, possible need for emergent surgical intervention, and possible need for surgical procedures to treat the patient's pathology. The patient is aware of the risks and agrees to proceed.  The procedure is scheduled for: 27 MAR 14.  I discussed in depth with the patient the nature of atherosclerosis, and emphasized the importance of maximal medical management including strict control of blood pressure, blood glucose, and lipid levels, antiplatelet agents, obtaining regular exercise, and cessation of smoking.  The patient is aware that without maximal medical management the underlying atherosclerotic disease process will progress, limiting the benefit of any interventions.  Thank you for allowing [place] to participate in this patient's care.  Leonides Sake, MD [facility] [place] Office: [phone] Pager: [phone]  02/20/2013, 1:53 PM

## 2013-02-25 ENCOUNTER — Encounter (HOSPITAL_COMMUNITY): Payer: Self-pay

## 2013-02-26 ENCOUNTER — Telehealth: Payer: Self-pay | Admitting: Vascular Surgery

## 2013-02-26 ENCOUNTER — Encounter (HOSPITAL_COMMUNITY)
Admission: RE | Disposition: A | Discharge status: Rehabilitation Facility | Payer: Self-pay | Source: Ambulatory Visit | Attending: Vascular Surgery

## 2013-02-26 ENCOUNTER — Ambulatory Visit (HOSPITAL_COMMUNITY)
Admission: RE | Admit: 2013-02-26 | Discharge: 2013-02-26 | Disposition: A | Discharge status: Rehabilitation Facility | Payer: MEDICARE | Source: Ambulatory Visit | Attending: Vascular Surgery | Admitting: Vascular Surgery

## 2013-02-26 DIAGNOSIS — S88119A Complete traumatic amputation at level between knee and ankle, unspecified lower leg, initial encounter: Secondary | ICD-10-CM | POA: Insufficient documentation

## 2013-02-26 DIAGNOSIS — L98499 Non-pressure chronic ulcer of skin of other sites with unspecified severity: Secondary | ICD-10-CM | POA: Insufficient documentation

## 2013-02-26 DIAGNOSIS — J449 Chronic obstructive pulmonary disease, unspecified: Secondary | ICD-10-CM | POA: Insufficient documentation

## 2013-02-26 DIAGNOSIS — Z95810 Presence of automatic (implantable) cardiac defibrillator: Secondary | ICD-10-CM | POA: Insufficient documentation

## 2013-02-26 DIAGNOSIS — I739 Peripheral vascular disease, unspecified: Secondary | ICD-10-CM | POA: Insufficient documentation

## 2013-02-26 DIAGNOSIS — I509 Heart failure, unspecified: Secondary | ICD-10-CM | POA: Insufficient documentation

## 2013-02-26 DIAGNOSIS — Z87891 Personal history of nicotine dependence: Secondary | ICD-10-CM | POA: Insufficient documentation

## 2013-02-26 DIAGNOSIS — I4891 Unspecified atrial fibrillation: Secondary | ICD-10-CM | POA: Insufficient documentation

## 2013-02-26 DIAGNOSIS — I5022 Chronic systolic (congestive) heart failure: Secondary | ICD-10-CM | POA: Insufficient documentation

## 2013-02-26 DIAGNOSIS — M109 Gout, unspecified: Secondary | ICD-10-CM | POA: Insufficient documentation

## 2013-02-26 DIAGNOSIS — J4489 Other specified chronic obstructive pulmonary disease: Secondary | ICD-10-CM | POA: Insufficient documentation

## 2013-02-26 DIAGNOSIS — N183 Chronic kidney disease, stage 3 unspecified: Secondary | ICD-10-CM | POA: Insufficient documentation

## 2013-02-26 DIAGNOSIS — L97409 Non-pressure chronic ulcer of unspecified heel and midfoot with unspecified severity: Secondary | ICD-10-CM | POA: Insufficient documentation

## 2013-02-26 DIAGNOSIS — I129 Hypertensive chronic kidney disease with stage 1 through stage 4 chronic kidney disease, or unspecified chronic kidney disease: Secondary | ICD-10-CM | POA: Insufficient documentation

## 2013-02-26 DIAGNOSIS — E119 Type 2 diabetes mellitus without complications: Secondary | ICD-10-CM | POA: Insufficient documentation

## 2013-02-26 DIAGNOSIS — Z79899 Other long term (current) drug therapy: Secondary | ICD-10-CM | POA: Insufficient documentation

## 2013-02-26 DIAGNOSIS — Z794 Long term (current) use of insulin: Secondary | ICD-10-CM | POA: Insufficient documentation

## 2013-02-26 DIAGNOSIS — Z7901 Long term (current) use of anticoagulants: Secondary | ICD-10-CM | POA: Insufficient documentation

## 2013-02-26 DIAGNOSIS — I2510 Atherosclerotic heart disease of native coronary artery without angina pectoris: Secondary | ICD-10-CM | POA: Insufficient documentation

## 2013-02-26 HISTORY — PX: ABDOMINAL AORTAGRAM: SHX5454

## 2013-02-26 LAB — POCT I-STAT, CHEM 8
BUN: 42 mg/dL — ABNORMAL HIGH (ref 6–23)
Chloride: 109 mEq/L (ref 96–112)
Potassium: 3.9 mEq/L (ref 3.5–5.1)
Sodium: 140 mEq/L (ref 135–145)

## 2013-02-26 SURGERY — ABDOMINAL AORTAGRAM
Anesthesia: LOCAL

## 2013-02-26 MED ORDER — OXYCODONE-ACETAMINOPHEN 5-325 MG PO TABS
1.0000 | ORAL_TABLET | ORAL | Status: DC | PRN
  Administered 2013-02-26: 2 via ORAL

## 2013-02-26 MED ORDER — HEPARIN (PORCINE) IN NACL 2-0.9 UNIT/ML-% IJ SOLN
INTRAMUSCULAR | Status: AC
  Filled 2013-02-26: qty 1000

## 2013-02-26 MED ORDER — LIDOCAINE HCL (PF) 1 % IJ SOLN
INTRAMUSCULAR | Status: AC
  Filled 2013-02-26: qty 30

## 2013-02-26 MED ORDER — ACETAMINOPHEN 325 MG PO TABS: 650.0000 mg | ORAL_TABLET | ORAL | Status: DC | PRN

## 2013-02-26 MED ORDER — SODIUM CHLORIDE 0.9 % IV SOLN
INTRAVENOUS | Status: DC
  Administered 2013-02-26: 08:00:00 via INTRAVENOUS

## 2013-02-26 MED ORDER — OXYCODONE-ACETAMINOPHEN 5-325 MG PO TABS
ORAL_TABLET | ORAL | Status: AC
  Filled 2013-02-26: qty 2

## 2013-02-26 MED ORDER — FENTANYL CITRATE 0.05 MG/ML IJ SOLN
INTRAMUSCULAR | Status: AC
  Filled 2013-02-26: qty 2

## 2013-02-26 MED ORDER — MORPHINE SULFATE 2 MG/ML IJ SOLN: 2.0000 mg | INTRAMUSCULAR | Status: DC | PRN

## 2013-02-26 MED ORDER — SODIUM CHLORIDE 0.9 % IV SOLN: 1.0000 mL/kg/h | INTRAVENOUS | Status: DC

## 2013-02-26 MED ORDER — ONDANSETRON HCL 4 MG/2ML IJ SOLN: 4.0000 mg | Freq: Four times a day (QID) | INTRAMUSCULAR | Status: DC | PRN

## 2013-02-26 NOTE — Interval H&P Note (Signed)
[facility] [place]  History and Physical Update  The patient was interviewed and re-examined.  The patient's previous History and Physical has been reviewed and is unchanged.  There is no change in the plan of care: Aortogram, right leg runoff, possible intervention.  Leonides Sake, MD [facility] [place] Office: [phone] Pager: [phone]  02/26/2013, 7:44 AM

## 2013-02-26 NOTE — Telephone Encounter (Addendum)
Message copied by Rosalyn Charters on Thu Feb 26, 2013 11:16 AM ------      Message from: Melene Plan      Created: Thu Feb 26, 2013 10:26 AM                   ----- Message -----         From: Fransisco Hertz, MD         Sent: 02/26/2013  10:12 AM           To: Reuel Derby, Melene Plan, RN            JEISON DELPILAR      [id number]      22-May-1935                  PROCEDURE:      1.  Left common femoral artery cannulation under ultrasound guidance      2.  Bilateral pelvic angiogram      3.  Second order arterial selection      4.  Right leg runoff            Follow-up: 2 weeks ------  notified patient of fu appt. on 03-13-13 1:15

## 2013-02-26 NOTE — Op Note (Signed)
OPERATIVE NOTE   PROCEDURE: 1.  Left common femoral artery cannulation under ultrasound guidance 2.  Bilateral pelvic angiogram 3.  Second order arterial selection 4.  Right leg runoff  PRE-OPERATIVE DIAGNOSIS: right heel ulcer  POST-OPERATIVE DIAGNOSIS: same as above   SURGEON: Leonides Sake, MD  ANESTHESIA: conscious sedation  ESTIMATED BLOOD LOSS: 50 cc  CONTRAST: 67 cc  FINDING(S):  Heavy calcification throughout arterial system  Aorta: only distal imaged, patent  Right Left  CIA Patent common iliac artery stent with <30% in-stent restenosis Patent  EIA Patent Patent  IIA Patent Patent  CFA Patent   SFA Patent with distal 50% stenosis   PFA Patent   Pop Patent with small below-the-knee segment, ~50% stenosis in this segment   Trif Patent   AT Patent   Pero Patent   PT Occluded    SPECIMEN(S):  none  INDICATIONS:   [name] is a 78 y.o. male who presents with right heel ulcer.  The patient presents for: right leg runoff.  I discussed with the patient the nature of angiographic procedures, especially the limited patencies of any endovascular intervention.  The patient is aware of that the risks of an angiographic procedure include but are not limited to: bleeding, infection, access site complications, renal failure, embolization, rupture of vessel, dissection, possible need for emergent surgical intervention, possible need for surgical procedures to treat the patient's pathology, and stroke and death.  The patient is aware of the risks and agrees to proceed.  DESCRIPTION: After full informed consent was obtained from the patient, the patient was brought back to the angiography suite.  The patient was placed supine upon the angiography table and connected to monitoring equipment.  The patient was then given conscious sedation, the amounts of which are documented in the patient's chart.  The patient was prepped and drape in the standard fashion for an angiographic  procedure.  At this point, attention was turned to the left groin.  Under ultrasound guidance, the left common femoral artery was cannulated with a micropuncture needle.  The microwire would not pass, so I removed the needle and held pressure.  I then repeated the cannulation with a 18 gauge needle under ultrasound guidance.  The [facility] wire also would not feed.  I pulled the needle and held pressure for a few minutes.  I then cannulated the left common femoral artery more proximally under ultrasound guidance with the micropuncture needle.  The microwire was advanced into the iliac arterial system.  The needle was exchanged for a microsheath, which was loaded into the common femoral artery over the wire.  The microwire was exchanged for a [facility] wire which was advanced into the aorta.  The microsheath was then exchanged for a 5-Fr sheath which was loaded into the common femoral artery.The Omniflush catheter was then loaded over the wire up to the level of just proximal to aortic bifurcation.  The catheter was connected to the power injector circuit.  After de-airring and de-clotting the circuit, a power injector bilateral pelvic angiogram was completed.  The [facility] wire was replaced in the catheter, and using the [place] and Omniflush catheter, the right common iliac artery was selected.  The catheter and wire were advanced into the external iliac artery.  The right leg was imaged in stations.  Based on the images, I would maximize medical management and continue with wound care.  I doubt that intervening on these two 50% stenoses will significantly improve right foot perfusion.  COMPLICATIONS:  none  CONDITION: stable   Leonides Sake, MD [facility] [place] Office: [phone] Pager: [phone]  02/26/2013, 10:02 AM

## 2013-02-26 NOTE — H&P (View-Only) (Signed)
[facility] [place]  Referred by:  Bruce C Golding, MD [address] [place], [place] [address]  Reason for referral: right heel ulcer  History of Present Illness  [name] is a 77 y.o. (03/24/1935) male who presents with chief complaint: right heel ulcer.  The patient developed bilateral heel ulcers months ago, trigger unknown.  The patient has undergone multiple percutaneous interventions with Bruce. Berry.  Ultimately, those interventions failed.  The patient then required a L BKA performed by Bruce. Yates.  The patient claims right heel ulcer was present months ago.  He denies any fever or chills.  He denies any frank purulence or bleeding from the heel.  He recently was debrided at [facility].  Pain is described as minimal as the patient doesn't feel much in his R leg.  The patient is currently non-ambulatory and wheelchair bound.  The patient has no rest pain or intermittent claudication symptoms also.  Atherosclerotic risk factors include: diabetes and hypertension.  Past Medical History  Diagnosis Date  . HTN (hypertension)   . PVD (peripheral vascular disease)   . COPD (chronic obstructive pulmonary disease)   . Diabetes mellitus     IDDM  . S/P CABG x 6 2003    [name], Myoview low risk 2009  . Gout   . Paroxysmal atrial fibrillation   . Hypothyroidism   . ICD (implantable cardiac defibrillator) discharge 5/11    MDT  . Chronic renal insufficiency, stage III (moderate)   . Chronic systolic CHF (congestive heart failure)     EF 25-30% echo 12/12  . S/P angioplasty, 11/27/12 -Lt below the knee popliteal artery 11/28/2012    Past Surgical History  Procedure Laterality Date  . Coronary artery bypass graft    . Cardiac catheterization    . Amputation  12/08/2012    Procedure: AMPUTATION BELOW KNEE;  Surgeon: Bruce C Yates, MD;  Location: [facility] OR;  Service: Orthopedics;  Laterality: Left;    History   Social History  .  Marital Status: Divorced    Spouse Name: N/A    Number of Children: N/A  . Years of Education: N/A   Occupational History  . Not on file.   Social History Main Topics  . Smoking status: Former Smoker    Types: Cigarettes    Quit date: 11/05/2003  . Smokeless tobacco: Never Used  . Alcohol Use: No  . Drug Use: No  . Sexually Active: No   Other Topics Concern  . Not on file   Social History Narrative  . No narrative on file    Family History  Problem Relation Age of Onset  . Heart disease Father   . Cancer Brother     Current Outpatient Prescriptions on File Prior to Visit  Medication Sig Dispense Refill  . atorvastatin (LIPITOR) 20 MG tablet Take 20 mg by mouth daily.      . feeding supplement (GLUCERNA SHAKE) LIQD Take 237 mLs by mouth 2 (two) times daily between meals.  60 Can  10  . furosemide (LASIX) 80 MG tablet Take 80 mg by mouth daily.       . gabapentin (NEURONTIN) 100 MG capsule Take 100 mg by mouth 3 (three) times daily.      . hydrALAZINE (APRESOLINE) 25 MG tablet Take 25 mg by mouth 3 (three) times daily.      . insulin aspart (NOVOLOG) 100 UNIT/ML injection Inject 10-15 Units into the   skin 3 (three) times daily before meals. Per sliding scale      . insulin glargine (LANTUS) 100 UNIT/ML injection Inject 12 Units into the skin 2 (two) times daily.  10 mL  11  . isosorbide dinitrate (ISORDIL) 20 MG tablet Take 20 mg by mouth 3 (three) times daily.      . levothyroxine (SYNTHROID, LEVOTHROID) 50 MCG tablet Take 50 mcg by mouth every morning.       . methocarbamol (ROBAXIN) 500 MG tablet Take 1 tablet (500 mg total) by mouth every 6 (six) hours as needed.  120 tablet  5  . omeprazole (PRILOSEC) 20 MG capsule Take 40 mg by mouth every morning.       . oxyCODONE-acetaminophen (PERCOCET/ROXICET) 5-325 MG per tablet Take 1-2 tablets by mouth every 4 (four) hours as needed.  30 tablet  0  . polyethylene glycol (MIRALAX / GLYCOLAX) packet Take 17 g by mouth 2 (two)  times daily.  14 each  5  . potassium chloride (K-DUR) 10 MEQ tablet Take 10 mEq by mouth daily.      . Tamsulosin HCl (FLOMAX) 0.4 MG CAPS Take by mouth.      . warfarin (COUMADIN) 5 MG tablet Take 2.5-5 mg by mouth every evening. Takes one tablet (5mg) every day except on Tuesdays and Fridays, take one-half tablet (2.5mg)      . [DISCONTINUED] insulin glargine (LANTUS) 100 UNIT/ML injection Inject 10 Units into the skin 2 (two) times daily.  10 mL  10   No current facility-administered medications on file prior to visit.    Allergies  Allergen Reactions  . Iodine Solution (Povidone Iodine) Rash    Topical only    REVIEW OF SYSTEMS:  (Positives checked otherwise negative)  CARDIOVASCULAR:  [ ] chest pain, [ ] chest pressure, [ ] palpitations, [x] shortness of breath when laying flat, [ ] shortness of breath with exertion,   [ ] pain in feet when walking, [x] pain in feet when laying flat, [ ] history of blood clot in veins (DVT), [ ] history of phlebitis, [x] swelling in legs, [x] varicose veins  PULMONARY:  [ ] productive cough, [ ] asthma, [ ] wheezing  NEUROLOGIC:  [x] weakness in arms or legs, [x] numbness in arms or legs, [ ] difficulty speaking or slurred speech, [ ] temporary loss of vision in one eye, [ ] dizziness  HEMATOLOGIC:  [ ] bleeding problems, [ ] problems with blood clotting too easily  MUSCULOSKEL:  [ ] joint pain, [ ] joint swelling  GASTROINTEST:  [ ]  Vomiting blood, [ ]  Blood in stool     GENITOURINARY:  [ ]  Burning with urination, [ ]  Blood in urine  PSYCHIATRIC:  [ ] history of major depression  INTEGUMENTARY:  [ ] rashes, [x] ulcers  CONSTITUTIONAL:  [ ] fever, [ ] chills   Physical Examination  Filed Vitals:   02/20/13 1328  BP: 124/78  Pulse: 91  Resp: 18  Height: 6' 2" (1.88 m)  Weight: 180 lb (81.647 kg)   Body mass index is 23.1 kg/(m^2).  General: A&O x 3, WDWN  Head: [place]/AT  Ear/Nose/Throat: Hearing grossly intact, nares w/o  erythema or drainage, oropharynx w/o Erythema/Exudate  Eyes: PERRLA, EOMI  Neck: Supple, no nuchal rigidity, no palpable LAD  Pulmonary: Sym exp, good air movt, CTAB, no rales, rhonchi, & wheezing  Cardiac: irr, irr, no Murmurs, rubs or gallops  Vascular: Vessel Right Left    Radial Palpable Palpable  Ulnar Palpable Palpable  Brachial Palpable Palpable  Carotid Palpable, without bruit Palpable, without bruit  Aorta Not palpable N/A  Femoral Palpable Palpable  Popliteal Not palpable Not palpable  PT Not Palpable BKA  DP Not Palpable BKA   Gastrointestinal: soft, NTND, -G/R, - HSM, - masses, - CVAT B  Musculoskeletal: M/S 5/5 throughout , L BKA, R heel with serosang drainage, raw appearance without any granulation  Neurologic: CN 2-12 intact , Pain and light touch intact in extremities except greatly decreased sensation in R foot, Motor exam as listed above  Psychiatric: Judgment intact, Mood & affect appropriatefor pt's clinical situation  Dermatologic: See M/S exam for extremity exam, no rashes otherwise noted  Lymph : No Cervical, Axillary, or Inguinal lymphadenopathy   Non-Invasive Vascular Imaging  OSH RLE ABI (Date: 02/18/13)  RLE: [place], PT: monophasic,  DP: monophasic  RLE arterial duplex (Date: 02/20/13)  R EIA: 268 c/s  Diffuse calcific plaque through SFA: PSV 270 c/s with monophasic flow  No PT, AT or peroneal flow visualized    Outside Studies/Documentation 4 pages of outside documents were reviewed including: outside wound care note, ABI.  Medical Decision Making  [name] is a 77 y.o. male who presents with: RLE critical limb ischemia, s/p L BKA   Unfortunately, I suspect this patient has tibial occlusion and likely will need a R BKA but he would like an attempt at salvage.  I discussed with the patient the natural history of critical limb ischemia: 25% require amputation in one year, 50% are able to maintain their limbs in one year, and 25-30%  die in one year due to comorbidities.  Given the limb threatening status of this patient, I recommend an aggressive work up including proceeding with an: Aortogram, Right leg runoff and intervention. I discussed with the patient the nature of angiographic procedures, especially the limited patencies of any endovascular intervention. The patient is aware of that the risks of an angiographic procedure include but are not limited to: bleeding, infection, access site complications, embolization, rupture of treated vessel, dissection, possible need for emergent surgical intervention, and possible need for surgical procedures to treat the patient's pathology. The patient is aware of the risks and agrees to proceed.  The procedure is scheduled for: 27 MAR 14.  I discussed in depth with the patient the nature of atherosclerosis, and emphasized the importance of maximal medical management including strict control of blood pressure, blood glucose, and lipid levels, antiplatelet agents, obtaining regular exercise, and cessation of smoking.  The patient is aware that without maximal medical management the underlying atherosclerotic disease process will progress, limiting the benefit of any interventions.  Thank you for allowing us to participate in this patient's care.  Bruce Chen, MD [facility] [place] Office: [phone] Pager: [phone]  02/20/2013, 1:53 PM     

## 2013-02-27 ENCOUNTER — Telehealth: Payer: Self-pay

## 2013-02-27 NOTE — Telephone Encounter (Signed)
Rec'd call from [facility] @ [employer].  States pt. Had a procedure yesterday, and the facility is questioning how long to hold PT/ OT.  States pt. C/o soreness at procedure site.  Discussed w/ Dr. Imogene Burn.  Recommends to hold PT / OT x 48 hrs., and then may resume.  Will fax note to [facility]. @ [phone].

## 2013-03-12 ENCOUNTER — Emergency Department (HOSPITAL_COMMUNITY): Payer: MEDICARE

## 2013-03-12 ENCOUNTER — Encounter: Payer: Self-pay | Admitting: Vascular Surgery

## 2013-03-12 ENCOUNTER — Inpatient Hospital Stay (HOSPITAL_COMMUNITY)
Admission: EM | Admit: 2013-03-12 | Discharge: 2013-03-20 | DRG: 574 | Disposition: A | Discharge status: Skilled Nursing Facility | Payer: MEDICARE | Attending: Internal Medicine | Admitting: Internal Medicine

## 2013-03-12 ENCOUNTER — Encounter (HOSPITAL_COMMUNITY): Payer: Self-pay | Admitting: Emergency Medicine

## 2013-03-12 ENCOUNTER — Encounter (HOSPITAL_BASED_OUTPATIENT_CLINIC_OR_DEPARTMENT_OTHER): Payer: MEDICARE | Attending: Internal Medicine

## 2013-03-12 DIAGNOSIS — E119 Type 2 diabetes mellitus without complications: Secondary | ICD-10-CM

## 2013-03-12 DIAGNOSIS — Z794 Long term (current) use of insulin: Secondary | ICD-10-CM

## 2013-03-12 DIAGNOSIS — L02619 Cutaneous abscess of unspecified foot: Principal | ICD-10-CM | POA: Diagnosis present

## 2013-03-12 DIAGNOSIS — Z7901 Long term (current) use of anticoagulants: Secondary | ICD-10-CM

## 2013-03-12 DIAGNOSIS — E785 Hyperlipidemia, unspecified: Secondary | ICD-10-CM

## 2013-03-12 DIAGNOSIS — E039 Hypothyroidism, unspecified: Secondary | ICD-10-CM | POA: Diagnosis present

## 2013-03-12 DIAGNOSIS — Z95810 Presence of automatic (implantable) cardiac defibrillator: Secondary | ICD-10-CM

## 2013-03-12 DIAGNOSIS — I2589 Other forms of chronic ischemic heart disease: Secondary | ICD-10-CM | POA: Diagnosis present

## 2013-03-12 DIAGNOSIS — Z87891 Personal history of nicotine dependence: Secondary | ICD-10-CM

## 2013-03-12 DIAGNOSIS — L03115 Cellulitis of right lower limb: Secondary | ICD-10-CM | POA: Diagnosis present

## 2013-03-12 DIAGNOSIS — Z951 Presence of aortocoronary bypass graft: Secondary | ICD-10-CM

## 2013-03-12 DIAGNOSIS — I255 Ischemic cardiomyopathy: Secondary | ICD-10-CM | POA: Diagnosis present

## 2013-03-12 DIAGNOSIS — L97409 Non-pressure chronic ulcer of unspecified heel and midfoot with unspecified severity: Secondary | ICD-10-CM | POA: Insufficient documentation

## 2013-03-12 DIAGNOSIS — D638 Anemia in other chronic diseases classified elsewhere: Secondary | ICD-10-CM | POA: Diagnosis present

## 2013-03-12 DIAGNOSIS — I509 Heart failure, unspecified: Secondary | ICD-10-CM | POA: Diagnosis present

## 2013-03-12 DIAGNOSIS — Z79899 Other long term (current) drug therapy: Secondary | ICD-10-CM

## 2013-03-12 DIAGNOSIS — I998 Other disorder of circulatory system: Secondary | ICD-10-CM

## 2013-03-12 DIAGNOSIS — I5022 Chronic systolic (congestive) heart failure: Secondary | ICD-10-CM

## 2013-03-12 DIAGNOSIS — I4891 Unspecified atrial fibrillation: Secondary | ICD-10-CM

## 2013-03-12 DIAGNOSIS — N184 Chronic kidney disease, stage 4 (severe): Secondary | ICD-10-CM | POA: Diagnosis present

## 2013-03-12 DIAGNOSIS — N179 Acute kidney failure, unspecified: Secondary | ICD-10-CM

## 2013-03-12 DIAGNOSIS — A419 Sepsis, unspecified organism: Secondary | ICD-10-CM

## 2013-03-12 DIAGNOSIS — J449 Chronic obstructive pulmonary disease, unspecified: Secondary | ICD-10-CM | POA: Diagnosis present

## 2013-03-12 DIAGNOSIS — G4730 Sleep apnea, unspecified: Secondary | ICD-10-CM

## 2013-03-12 DIAGNOSIS — I10 Essential (primary) hypertension: Secondary | ICD-10-CM

## 2013-03-12 DIAGNOSIS — L0390 Cellulitis, unspecified: Secondary | ICD-10-CM

## 2013-03-12 DIAGNOSIS — J4489 Other specified chronic obstructive pulmonary disease: Secondary | ICD-10-CM | POA: Diagnosis present

## 2013-03-12 DIAGNOSIS — D72829 Elevated white blood cell count, unspecified: Secondary | ICD-10-CM | POA: Diagnosis present

## 2013-03-12 DIAGNOSIS — L97509 Non-pressure chronic ulcer of other part of unspecified foot with unspecified severity: Secondary | ICD-10-CM

## 2013-03-12 DIAGNOSIS — E876 Hypokalemia: Secondary | ICD-10-CM | POA: Diagnosis not present

## 2013-03-12 DIAGNOSIS — S96819A Strain of other specified muscles and tendons at ankle and foot level, unspecified foot, initial encounter: Secondary | ICD-10-CM | POA: Diagnosis present

## 2013-03-12 DIAGNOSIS — L89890 Pressure ulcer of other site, unstageable: Secondary | ICD-10-CM

## 2013-03-12 DIAGNOSIS — S88119A Complete traumatic amputation at level between knee and ankle, unspecified lower leg, initial encounter: Secondary | ICD-10-CM | POA: Insufficient documentation

## 2013-03-12 DIAGNOSIS — I2510 Atherosclerotic heart disease of native coronary artery without angina pectoris: Secondary | ICD-10-CM | POA: Diagnosis present

## 2013-03-12 DIAGNOSIS — S93499A Sprain of other ligament of unspecified ankle, initial encounter: Secondary | ICD-10-CM | POA: Diagnosis present

## 2013-03-12 DIAGNOSIS — L02419 Cutaneous abscess of limb, unspecified: Secondary | ICD-10-CM

## 2013-03-12 DIAGNOSIS — Z89519 Acquired absence of unspecified leg below knee: Secondary | ICD-10-CM

## 2013-03-12 DIAGNOSIS — I739 Peripheral vascular disease, unspecified: Secondary | ICD-10-CM | POA: Diagnosis present

## 2013-03-12 DIAGNOSIS — I129 Hypertensive chronic kidney disease with stage 1 through stage 4 chronic kidney disease, or unspecified chronic kidney disease: Secondary | ICD-10-CM | POA: Diagnosis present

## 2013-03-12 DIAGNOSIS — L97429 Non-pressure chronic ulcer of left heel and midfoot with unspecified severity: Secondary | ICD-10-CM

## 2013-03-12 DIAGNOSIS — X58XXXA Exposure to other specified factors, initial encounter: Secondary | ICD-10-CM | POA: Diagnosis present

## 2013-03-12 DIAGNOSIS — I472 Ventricular tachycardia: Secondary | ICD-10-CM

## 2013-03-12 DIAGNOSIS — E1149 Type 2 diabetes mellitus with other diabetic neurological complication: Secondary | ICD-10-CM | POA: Diagnosis present

## 2013-03-12 DIAGNOSIS — I499 Cardiac arrhythmia, unspecified: Secondary | ICD-10-CM

## 2013-03-12 DIAGNOSIS — R768 Other specified abnormal immunological findings in serum: Secondary | ICD-10-CM

## 2013-03-12 DIAGNOSIS — E1142 Type 2 diabetes mellitus with diabetic polyneuropathy: Secondary | ICD-10-CM | POA: Diagnosis present

## 2013-03-12 DIAGNOSIS — I480 Paroxysmal atrial fibrillation: Secondary | ICD-10-CM

## 2013-03-12 DIAGNOSIS — L97909 Non-pressure chronic ulcer of unspecified part of unspecified lower leg with unspecified severity: Secondary | ICD-10-CM

## 2013-03-12 DIAGNOSIS — L089 Local infection of the skin and subcutaneous tissue, unspecified: Secondary | ICD-10-CM | POA: Insufficient documentation

## 2013-03-12 DIAGNOSIS — E1169 Type 2 diabetes mellitus with other specified complication: Secondary | ICD-10-CM | POA: Insufficient documentation

## 2013-03-12 DIAGNOSIS — Z4502 Encounter for adjustment and management of automatic implantable cardiac defibrillator: Secondary | ICD-10-CM

## 2013-03-12 DIAGNOSIS — M109 Gout, unspecified: Secondary | ICD-10-CM | POA: Diagnosis present

## 2013-03-12 DIAGNOSIS — D649 Anemia, unspecified: Secondary | ICD-10-CM

## 2013-03-12 LAB — BASIC METABOLIC PANEL
BUN: 50 mg/dL — ABNORMAL HIGH (ref 6–23)
Chloride: 98 mEq/L (ref 96–112)
GFR calc Af Amer: 35 mL/min — ABNORMAL LOW (ref >90)
GFR calc non Af Amer: 30 mL/min — ABNORMAL LOW (ref >90)
Potassium: 2.9 mEq/L — ABNORMAL LOW (ref 3.5–5.1)
Sodium: 138 mEq/L (ref 135–145)

## 2013-03-12 LAB — CBC WITH DIFFERENTIAL/PLATELET
Eosinophils Absolute: 0.1 10*3/uL (ref 0.0–0.7)
Eosinophils Relative: 1 % (ref 0–5)
HCT: 29.6 % — ABNORMAL LOW (ref 39.0–52.0)
Hemoglobin: 9.5 g/dL — ABNORMAL LOW (ref 13.0–17.0)
Lymphocytes Relative: 7 % — ABNORMAL LOW (ref 12–46)
Lymphs Abs: 1 10*3/uL (ref 0.7–4.0)
MCH: 25.3 pg — ABNORMAL LOW (ref 26.0–34.0)
MCV: 78.7 fL (ref 78.0–100.0)
Monocytes Relative: 7 % (ref 3–12)
RBC: 3.76 MIL/uL — ABNORMAL LOW (ref 4.22–5.81)

## 2013-03-12 LAB — GLUCOSE, CAPILLARY: Glucose-Capillary: 141 mg/dL — ABNORMAL HIGH (ref 70–99)

## 2013-03-12 LAB — PROTIME-INR: Prothrombin Time: 30.9 seconds — ABNORMAL HIGH (ref 11.6–15.2)

## 2013-03-12 MED ORDER — ONDANSETRON HCL 4 MG PO TABS
4.0000 mg | ORAL_TABLET | Freq: Four times a day (QID) | ORAL | Status: DC | PRN
  Administered 2013-03-14: 4 mg via ORAL
  Filled 2013-03-12: qty 1

## 2013-03-12 MED ORDER — OXYCODONE HCL 5 MG PO TABS
10.0000 mg | ORAL_TABLET | ORAL | Status: DC | PRN
  Administered 2013-03-13 – 2013-03-20 (×13): 10 mg via ORAL
  Filled 2013-03-12 (×13): qty 2

## 2013-03-12 MED ORDER — LEVOTHYROXINE SODIUM 50 MCG PO TABS
50.0000 ug | ORAL_TABLET | ORAL | Status: DC
  Administered 2013-03-13 – 2013-03-20 (×8): 50 ug via ORAL
  Filled 2013-03-12 (×9): qty 1

## 2013-03-12 MED ORDER — GABAPENTIN 100 MG PO CAPS
100.0000 mg | ORAL_CAPSULE | Freq: Three times a day (TID) | ORAL | Status: DC
  Administered 2013-03-12 – 2013-03-20 (×22): 100 mg via ORAL
  Filled 2013-03-12 (×26): qty 1

## 2013-03-12 MED ORDER — POLYETHYLENE GLYCOL 3350 17 G PO PACK
17.0000 g | PACK | Freq: Every day | ORAL | Status: DC
  Administered 2013-03-13 – 2013-03-20 (×4): 17 g via ORAL
  Filled 2013-03-12 (×8): qty 1

## 2013-03-12 MED ORDER — FERROCITE PLUS 106-1 MG PO TABS: 1.0000 | ORAL_TABLET | Freq: Every day | ORAL | Status: DC

## 2013-03-12 MED ORDER — PANTOPRAZOLE SODIUM 40 MG PO TBEC
40.0000 mg | DELAYED_RELEASE_TABLET | Freq: Every day | ORAL | Status: DC
  Administered 2013-03-13 – 2013-03-20 (×6): 40 mg via ORAL
  Filled 2013-03-12 (×8): qty 1

## 2013-03-12 MED ORDER — HYDRALAZINE HCL 25 MG PO TABS
25.0000 mg | ORAL_TABLET | Freq: Three times a day (TID) | ORAL | Status: DC
  Administered 2013-03-12 – 2013-03-20 (×22): 25 mg via ORAL
  Filled 2013-03-12 (×26): qty 1

## 2013-03-12 MED ORDER — TAMSULOSIN HCL 0.4 MG PO CAPS
0.4000 mg | ORAL_CAPSULE | Freq: Every day | ORAL | Status: DC
  Administered 2013-03-12 – 2013-03-19 (×8): 0.4 mg via ORAL
  Filled 2013-03-12 (×9): qty 1

## 2013-03-12 MED ORDER — FUROSEMIDE 40 MG PO TABS
40.0000 mg | ORAL_TABLET | Freq: Two times a day (BID) | ORAL | Status: DC
  Administered 2013-03-12 – 2013-03-20 (×14): 40 mg via ORAL
  Filled 2013-03-12 (×19): qty 1

## 2013-03-12 MED ORDER — SENNOSIDES-DOCUSATE SODIUM 8.6-50 MG PO TABS
2.0000 | ORAL_TABLET | Freq: Two times a day (BID) | ORAL | Status: DC
  Administered 2013-03-12 – 2013-03-20 (×14): 2 via ORAL
  Filled 2013-03-12 (×17): qty 2

## 2013-03-12 MED ORDER — CALCIUM CARBONATE 1250 (500 CA) MG PO TABS
1.0000 | ORAL_TABLET | Freq: Two times a day (BID) | ORAL | Status: DC
  Administered 2013-03-13 – 2013-03-20 (×13): 500 mg via ORAL
  Filled 2013-03-12 (×18): qty 1

## 2013-03-12 MED ORDER — POTASSIUM CHLORIDE CRYS ER 20 MEQ PO TBCR
20.0000 meq | EXTENDED_RELEASE_TABLET | Freq: Once | ORAL | Status: AC
  Administered 2013-03-12: 20 meq via ORAL
  Filled 2013-03-12: qty 1

## 2013-03-12 MED ORDER — SODIUM CHLORIDE 0.9 % IJ SOLN
3.0000 mL | Freq: Two times a day (BID) | INTRAMUSCULAR | Status: DC
  Administered 2013-03-13 – 2013-03-15 (×3): 3 mL via INTRAVENOUS

## 2013-03-12 MED ORDER — PIPERACILLIN-TAZOBACTAM 3.375 G IVPB
3.3750 g | Freq: Three times a day (TID) | INTRAVENOUS | Status: DC
  Administered 2013-03-12 – 2013-03-20 (×23): 3.375 g via INTRAVENOUS
  Filled 2013-03-12 (×27): qty 50

## 2013-03-12 MED ORDER — INSULIN ASPART 100 UNIT/ML ~~LOC~~ SOLN
0.0000 [IU] | Freq: Three times a day (TID) | SUBCUTANEOUS | Status: DC
  Administered 2013-03-12: 3 [IU] via SUBCUTANEOUS
  Administered 2013-03-13: 2 [IU] via SUBCUTANEOUS
  Administered 2013-03-13: 3 [IU] via SUBCUTANEOUS
  Administered 2013-03-14: 8 [IU] via SUBCUTANEOUS
  Administered 2013-03-15 (×3): 5 [IU] via SUBCUTANEOUS

## 2013-03-12 MED ORDER — ACETAMINOPHEN 650 MG RE SUPP: 650.0000 mg | Freq: Four times a day (QID) | RECTAL | Status: DC | PRN

## 2013-03-12 MED ORDER — INSULIN GLARGINE 100 UNIT/ML ~~LOC~~ SOLN
20.0000 [IU] | Freq: Two times a day (BID) | SUBCUTANEOUS | Status: DC
  Administered 2013-03-15: 20 [IU] via SUBCUTANEOUS
  Filled 2013-03-12 (×6): qty 0.2

## 2013-03-12 MED ORDER — CALCIUM CARBONATE 600 MG PO TABS
600.0000 mg | ORAL_TABLET | Freq: Two times a day (BID) | ORAL | Status: DC
  Filled 2013-03-12 (×2): qty 1

## 2013-03-12 MED ORDER — VANCOMYCIN HCL IN DEXTROSE 1-5 GM/200ML-% IV SOLN
1000.0000 mg | INTRAVENOUS | Status: DC
  Administered 2013-03-13 – 2013-03-20 (×8): 1000 mg via INTRAVENOUS
  Filled 2013-03-12 (×8): qty 200

## 2013-03-12 MED ORDER — ALBUTEROL SULFATE (5 MG/ML) 0.5% IN NEBU: 2.5000 mg | INHALATION_SOLUTION | RESPIRATORY_TRACT | Status: DC | PRN

## 2013-03-12 MED ORDER — SODIUM CHLORIDE 0.9 % IV SOLN
250.0000 mL | INTRAVENOUS | Status: DC | PRN
Start: 2013-03-12 — End: 2013-03-20

## 2013-03-12 MED ORDER — INSULIN ASPART 100 UNIT/ML ~~LOC~~ SOLN
0.0000 [IU] | Freq: Every day | SUBCUTANEOUS | Status: DC
  Administered 2013-03-15: 2 [IU] via SUBCUTANEOUS

## 2013-03-12 MED ORDER — FE FUMARATE-B12-VIT C-FA-IFC PO CAPS
1.0000 | ORAL_CAPSULE | Freq: Every day | ORAL | Status: DC
  Administered 2013-03-13 – 2013-03-20 (×6): 1 via ORAL
  Filled 2013-03-12 (×8): qty 1

## 2013-03-12 MED ORDER — ATORVASTATIN CALCIUM 20 MG PO TABS
20.0000 mg | ORAL_TABLET | Freq: Every day | ORAL | Status: DC
  Administered 2013-03-12 – 2013-03-19 (×8): 20 mg via ORAL
  Filled 2013-03-12 (×9): qty 1

## 2013-03-12 MED ORDER — SODIUM CHLORIDE 0.9 % IJ SOLN: 3.0000 mL | Freq: Two times a day (BID) | INTRAMUSCULAR | Status: DC

## 2013-03-12 MED ORDER — SENNA-DOCUSATE SODIUM 8.6-50 MG PO TABS: 2.0000 | ORAL_TABLET | Freq: Two times a day (BID) | ORAL | Status: DC

## 2013-03-12 MED ORDER — ONDANSETRON HCL 4 MG/2ML IJ SOLN
4.0000 mg | Freq: Once | INTRAMUSCULAR | Status: AC
  Administered 2013-03-12: 4 mg via INTRAVENOUS
  Filled 2013-03-12: qty 2

## 2013-03-12 MED ORDER — VANCOMYCIN HCL IN DEXTROSE 1-5 GM/200ML-% IV SOLN
1000.0000 mg | Freq: Once | INTRAVENOUS | Status: AC
  Administered 2013-03-12: 1000 mg via INTRAVENOUS
  Filled 2013-03-12: qty 200

## 2013-03-12 MED ORDER — METHOCARBAMOL 500 MG PO TABS
500.0000 mg | ORAL_TABLET | Freq: Four times a day (QID) | ORAL | Status: DC | PRN
  Administered 2013-03-15 – 2013-03-20 (×5): 500 mg via ORAL
  Filled 2013-03-12 (×5): qty 1

## 2013-03-12 MED ORDER — OXYCODONE HCL 10 MG PO TABS: 10.0000 mg | ORAL_TABLET | ORAL | Status: DC | PRN

## 2013-03-12 MED ORDER — SODIUM CHLORIDE 0.9 % IJ SOLN: 3.0000 mL | INTRAMUSCULAR | Status: DC | PRN

## 2013-03-12 MED ORDER — HYDROMORPHONE HCL PF 1 MG/ML IJ SOLN
1.0000 mg | INTRAMUSCULAR | Status: DC | PRN
  Administered 2013-03-13 – 2013-03-20 (×15): 1 mg via INTRAVENOUS
  Filled 2013-03-12 (×14): qty 1

## 2013-03-12 MED ORDER — ONDANSETRON HCL 4 MG/2ML IJ SOLN
4.0000 mg | Freq: Four times a day (QID) | INTRAMUSCULAR | Status: DC | PRN
  Administered 2013-03-15 – 2013-03-16 (×2): 4 mg via INTRAVENOUS
  Filled 2013-03-12 (×2): qty 2

## 2013-03-12 MED ORDER — HYDROMORPHONE HCL PF 1 MG/ML IJ SOLN
1.0000 mg | Freq: Once | INTRAMUSCULAR | Status: AC
  Administered 2013-03-12: 1 mg via INTRAVENOUS
  Filled 2013-03-12: qty 1

## 2013-03-12 MED ORDER — ACETAMINOPHEN 325 MG PO TABS
650.0000 mg | ORAL_TABLET | Freq: Four times a day (QID) | ORAL | Status: DC | PRN
  Administered 2013-03-15: 650 mg via ORAL
  Filled 2013-03-12: qty 2

## 2013-03-12 MED ORDER — ISOSORBIDE DINITRATE 20 MG PO TABS
20.0000 mg | ORAL_TABLET | Freq: Three times a day (TID) | ORAL | Status: DC
  Administered 2013-03-12 – 2013-03-20 (×22): 20 mg via ORAL
  Filled 2013-03-12 (×27): qty 1

## 2013-03-12 NOTE — ED Notes (Signed)
Admitting MD at bedside.

## 2013-03-12 NOTE — Progress Notes (Signed)
  The patient was seen by Dr. Royann Shivers on April 9th, 2014.  Interrogation of his ICD revealed his burden of atrial arrhythmias was less than 0.1% for a total of 56 seconds.  Apparently his Xarelto was discontinue on March 25 due to bleeding of a heel ulcer.  Dr Royann Shivers felt the patient's risk of embolism was low.    Amberleigh Gerken PA-C 4:24 PM

## 2013-03-12 NOTE — ED Notes (Signed)
Pt complains of pain to right lower extremity wound at this time. Per report, pt is currently being treated for wound care and "they noticed that the wound is red and hot" Pt transferred to [facility] for further evaluation.

## 2013-03-12 NOTE — ED Provider Notes (Signed)
History     CSN: [id number]  Arrival date & time 03/12/13  1055   First MD Initiated Contact with Patient 03/12/13 1102      Chief Complaint  Patient presents with  . Wound Infection    (Consider location/radiation/quality/duration/timing/severity/associated sxs/prior treatment) HPI Comments: Patient has a history of ongoing wound to the right heel that has worsened over the last 2 weeks and now is red, painful and draining. He was seen at wound care today by Dr. Laural Benes who sent him to the emergency room due to concern for infection. Patient states he's had a dry mouth and felt feverish but denies any nausea, vomiting or diarrhea. He also has a history of peripheral vascular disease and had an angiogram done approximately 2-1/2 weeks ago that showed 50% stenosis on the right but no intervention was done.  Patient denies taking any antibiotics currently but states his wound has progressively become more painful. He currently is taking oxycodone for the pain with improvement.  The history is provided by the patient.    Past Medical History  Diagnosis Date  . HTN (hypertension)   . PVD (peripheral vascular disease)   . COPD (chronic obstructive pulmonary disease)   . Diabetes mellitus     IDDM  . S/P CABG x 6 2003    Dr Cornelius Moras, Myoview low risk 2009  . Gout   . Paroxysmal atrial fibrillation   . Hypothyroidism   . ICD (implantable cardiac defibrillator) discharge 5/11    MDT  . Chronic renal insufficiency, stage III (moderate)   . Chronic systolic CHF (congestive heart failure)     EF 25-30% echo 12/12  . S/P angioplasty, 11/27/12 -Lt below the knee popliteal artery 11/28/2012    Past Surgical History  Procedure Laterality Date  . Coronary artery bypass graft    . Cardiac catheterization    . Amputation  12/08/2012    Procedure: AMPUTATION BELOW KNEE;  Surgeon: Eldred Manges, MD;  Location: [facility] OR;  Service: Orthopedics;  Laterality: Left;    Family History  Problem Relation  Age of Onset  . Heart disease Father   . Cancer Brother     History  Substance Use Topics  . Smoking status: Former Smoker    Types: Cigarettes    Quit date: 11/05/2003  . Smokeless tobacco: Never Used  . Alcohol Use: No      Review of Systems  Constitutional: Positive for fever. Negative for appetite change.  Respiratory: Negative for shortness of breath.   Gastrointestinal: Negative for nausea and vomiting.  All other systems reviewed and are negative.    Allergies  Iodine solution  Home Medications   Current Outpatient Rx  Name  Route  Sig  Dispense  Refill  . atorvastatin (LIPITOR) 20 MG tablet   Oral   Take 20 mg by mouth daily.         . Fe Fum-FA-B Cmp-C-Zn-Mg-Mn-Cu (FERROCITE PLUS) 106-1 MG TABS   Oral   Take 1 tablet by mouth daily.         . furosemide (LASIX) 40 MG tablet   Oral   Take 40 mg by mouth 2 (two) times daily.         Marland Kitchen gabapentin (NEURONTIN) 100 MG capsule   Oral   Take 100 mg by mouth 3 (three) times daily.         . hydrALAZINE (APRESOLINE) 25 MG tablet   Oral   Take 25 mg by mouth 3 (three) times  daily.         . insulin glargine (LANTUS) 100 UNIT/ML injection   Subcutaneous   Inject 20 Units into the skin at bedtime.         . isosorbide dinitrate (ISORDIL) 20 MG tablet   Oral   Take 20 mg by mouth 3 (three) times daily.         Marland Kitchen levothyroxine (SYNTHROID, LEVOTHROID) 50 MCG tablet   Oral   Take 50 mcg by mouth every morning.          . methocarbamol (ROBAXIN) 500 MG tablet   Oral   Take 1 tablet (500 mg total) by mouth every 6 (six) hours as needed.   120 tablet   5   . oxyCODONE-acetaminophen (PERCOCET/ROXICET) 5-325 MG per tablet   Oral   Take 1-2 tablets by mouth every 4 (four) hours as needed.   30 tablet   0   . pantoprazole (PROTONIX) 40 MG tablet   Oral   Take 40 mg by mouth daily.         . polyethylene glycol (MIRALAX / GLYCOLAX) packet   Oral   Take 17 g by mouth daily.          . Rivaroxaban (XARELTO) 20 MG TABS   Oral   Take 20 mg by mouth daily.         . sennosides-docusate sodium (SENOKOT-S) 8.6-50 MG tablet   Oral   Take 2 tablets by mouth 2 (two) times daily.         . Tamsulosin HCl (FLOMAX) 0.4 MG CAPS   Oral   Take 0.4 mg by mouth every evening.            BP 134/61  Pulse 92  Temp(Src) 98.2 F (36.8 C) (Oral)  Resp 20  SpO2 98%  Physical Exam  Nursing note and vitals reviewed. Constitutional: He is oriented to person, place, and time. He appears well-developed and well-nourished. No distress.  HENT:  Head: Normocephalic and atraumatic.  Mouth/Throat: Oropharynx is clear and moist.  Eyes: Conjunctivae and EOM are normal. Pupils are equal, round, and reactive to light.  Neck: Normal range of motion. Neck supple.  Cardiovascular: Normal rate, regular rhythm and intact distal pulses.   No murmur heard. Pulmonary/Chest: Effort normal and breath sounds normal. No respiratory distress. He has no wheezes. He has no rales.  Abdominal: Soft. He exhibits no distension. There is no tenderness. There is no rebound and no guarding.  Musculoskeletal: Normal range of motion. He exhibits edema and tenderness.  Left BKA. Right leg with a decubitus ulcer noted on the right heel with malodorous discharge and redness and edema going to the mid shin with warmth. 1+ DP pulses palpated  Neurological: He is alert and oriented to person, place, and time.  Skin: Skin is warm and dry. No rash noted. No erythema.  Psychiatric: He has a normal mood and affect. His behavior is normal.    ED Course  Procedures (including critical care time)  Labs Reviewed  CBC WITH DIFFERENTIAL - Abnormal; Notable for the following:    WBC 13.7 (*)    RBC 3.76 (*)    Hemoglobin 9.5 (*)    HCT 29.6 (*)    MCH 25.3 (*)    RDW 17.3 (*)    Neutrophils Relative 85 (*)    Neutro Abs 11.7 (*)    Lymphocytes Relative 7 (*)    All other components within normal limits  BASIC  METABOLIC PANEL - Abnormal; Notable for the following:    Potassium 2.9 (*)    Glucose, Bld 173 (*)    BUN 50 (*)    Creatinine, Ser 2.05 (*)    GFR calc non Af Amer 30 (*)    GFR calc Af Amer 35 (*)    All other components within normal limits  PROTIME-INR   Dg Foot Complete Right  03/12/2013  *RADIOLOGY REPORT*  Clinical Data: Swollen right foot.  RIGHT FOOT COMPLETE - 3+ VIEW  Comparison: 07/19/2012  Findings: Three views of the right foot were obtained.  Again noted are large erosions involving the distal aspect of the first metatarsal bone.  Again noted are erosions of the second toe DIP joint.  Alignment of the right foot has not changed.  There is no evidence for an acute fracture or dislocation.  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   Original Report Authenticated By: Richarda Overlie, M.D.      1. Cellulitis   2. Decubitus ulcer of foot, unstageable       MDM   Patient with a history of right heel wound that has worsened over the last few weeks and now is very painful and draining more. He was seen at wound care today and sent here due to concern for infection of his wound. He endorses fever but denies any nausea or vomiting. The wound is malodorous and redness going to the mid shin. Patient has a palpable pulse and recently had an angiogram done of the right leg showed some stenosis but no intervention at that time. Medical management was recommended.  CBC, BMP, foot x-ray pending  1:38 PM Leukocytosis of 13.7. BMP was stable creatinine. Plain film shows no sign of bony involvement heel ulcer. Patient given IV vancomycin and admitted     Gwyneth Sprout, MD 03/12/13 [phone]

## 2013-03-12 NOTE — ED Notes (Addendum)
Paged admitting MD for pain medication.

## 2013-03-12 NOTE — ED Notes (Signed)
MD at bedside. 

## 2013-03-12 NOTE — ED Notes (Signed)
Attempted to call pts niece Herbert Seta regarding admission at [phone]. Left message for a return call.

## 2013-03-12 NOTE — Progress Notes (Signed)
ANTIBIOTIC CONSULT NOTE - INITIAL  Pharmacy Consult for Vancomycin and Zosyn Indication: Cellulitis  Allergies  Allergen Reactions  . Iodine Solution (Povidone Iodine) Rash    Topical only    Patient Measurements: Height: 6\' 2"  (188 cm) Weight: 180 lb (81.647 kg) IBW/kg (Calculated) : 82.2  Vital Signs: Temp: 97.6 F (36.4 C) (04/10 1539) Temp src: Oral (04/10 1539) BP: 139/66 mmHg (04/10 1539) Pulse Rate: 88 (04/10 1539) Intake/Output from previous day:   Intake/Output from this shift: Total I/O In: -  Out: 300 [Urine:300]  Labs:  Recent Labs  03/12/13 1130  WBC 13.7*  HGB 9.5*  PLT 240  CREATININE 2.05*   Estimated Creatinine Clearance: 35.9 ml/min (by C-G formula based on Cr of 2.05).  31 ml/min/1.59m2 (normalized)   Microbiology: No results found for this or any previous visit (from the past 720 hour(s)).  Medical History: Past Medical History  Diagnosis Date  . HTN (hypertension)   . PVD (peripheral vascular disease)   . COPD (chronic obstructive pulmonary disease)   . Diabetes mellitus     IDDM  . S/P CABG x 6 2003    Dr Cornelius Moras, Myoview low risk 2009  . Gout   . Paroxysmal atrial fibrillation   . Hypothyroidism   . ICD (implantable cardiac defibrillator) discharge 5/11    MDT  . Chronic renal insufficiency, stage III (moderate)   . Chronic systolic CHF (congestive heart failure)     EF 25-30% echo 12/12  . S/P angioplasty, 11/27/12 -Lt below the knee popliteal artery 11/28/2012    Medications:  Scheduled:  . atorvastatin  20 mg Oral QHS  . calcium carbonate  600 mg Oral BID WC  . Ferrocite Plus  1 tablet Oral Daily  . furosemide  40 mg Oral BID  . gabapentin  100 mg Oral TID  . hydrALAZINE  25 mg Oral TID  . [COMPLETED]  HYDROmorphone (DILAUDID) injection  1 mg Intravenous Once  . insulin aspart  0-15 Units Subcutaneous TID WC  . insulin aspart  0-5 Units Subcutaneous QHS  . insulin glargine  20 Units Subcutaneous BID  . isosorbide  dinitrate  20 mg Oral TID  . [START ON 03/13/2013] levothyroxine  50 mcg Oral BH-q7a  . [COMPLETED] ondansetron (ZOFRAN) IV  4 mg Intravenous Once  . [START ON 03/13/2013] pantoprazole  40 mg Oral Daily  . [START ON 03/13/2013] polyethylene glycol  17 g Oral Daily  . potassium chloride  20 mEq Oral Once  . senna-docusate  2 tablet Oral BID  . sodium chloride  3 mL Intravenous Q12H  . sodium chloride  3 mL Intravenous Q12H  . tamsulosin  0.4 mg Oral QHS  . [COMPLETED] vancomycin  1,000 mg Intravenous Once  . [DISCONTINUED] sennosides-docusate sodium  2 tablet Oral BID   Assessment: 78 yo male with DM and PVD presents with an ongoing right heel wound that has worsened over the last 2 weeks. Received vancomycin 1gm ~15:00 today, MD now ordering to continue vancomycin and add zosyn per Pharmacy.  SCr elevated at 2.05, CrCl ~30 ml/min  WBC elevated at 13.7  Currently afebrile  No cultures ordered yet  Goal of Therapy:  Vancomycin trough level 10-15 mcg/ml  Plan:   Continue with Vancomycin 1gm IV q24h Check trough at steady state Zosyn 3.375gm IV q8h (4hr extended infusions) Follow up renal function & cultures (if ordered)   Loralee Pacas, PharmD, BCPS Pager: [phone] 03/12/2013,5:04 PM

## 2013-03-12 NOTE — H&P (Addendum)
[facility] History and Physical  CONNAR KEATING ZOX:[id number] DOB: July 25, 1935 DOA: 03/12/2013   PCP: Colette Ribas, MD  Specialists: Patient is followed by [facility] and vascular. He was seen by Dr. Imogene Burn with vascular surgery. He was also seen recently by Dr. Magnus Ivan, with orthopedic surgery.  Chief Complaint:  Pain in the right leg  HPI: [name] is a 78 y.o. male with a complicated past medical history including coronary artery disease, ischemic cardiomyopathy with EF of 20%, diabetes, hypertension, peripheral vascular disease who underwent a left below knee amputation earlier this year. He went to rehabilitation and is still getting rehabilitation in a skilled nursing facility. He has a nonhealing wound in the right heel now and he underwent angiogram a few days ago, which did not show any significant stenosis. Patient was sent over from SNF today because he has worsening pain in the right leg and he was noted to have erythema in the leg as well. Patient is a poor historian. He tells me that the pain is 10 out of 10 in intensity. Denies any nausea, vomiting. He denied any fever or chills. He was not sure whether he was still on Coumadin or not. He, said he was taken off of this medication by his cardiologist recently though this remains unclear. Overall not a very good history.  Home Medications: Prior to Admission medications   Medication Sig Start Date End Date Taking? Authorizing Provider  atorvastatin (LIPITOR) 20 MG tablet Take 20 mg by mouth at bedtime.    Yes Historical Provider, MD  calcium carbonate (OS-CAL) 600 MG TABS Take 600 mg by mouth 2 (two) times daily with a meal.   Yes Historical Provider, MD  Fe Fum-FA-B Cmp-C-Zn-Mg-Mn-Cu (FERROCITE PLUS) 106-1 MG TABS Take 1 tablet by mouth daily.   Yes Historical Provider, MD  furosemide (LASIX) 40 MG tablet Take 40 mg by mouth 2 (two) times daily.   Yes Historical Provider, MD  gabapentin (NEURONTIN) 100 MG  capsule Take 100 mg by mouth 3 (three) times daily.   Yes Historical Provider, MD  hydrALAZINE (APRESOLINE) 25 MG tablet Take 25 mg by mouth 3 (three) times daily.   Yes Historical Provider, MD  insulin glargine (LANTUS) 100 UNIT/ML injection Inject 20 Units into the skin 2 (two) times daily.    Yes Historical Provider, MD  isosorbide dinitrate (ISORDIL) 20 MG tablet Take 20 mg by mouth 3 (three) times daily.   Yes Historical Provider, MD  levothyroxine (SYNTHROID, LEVOTHROID) 50 MCG tablet Take 50 mcg by mouth every morning.    Yes Historical Provider, MD  methocarbamol (ROBAXIN) 500 MG tablet Take 500 mg by mouth 4 (four) times daily as needed (for muscle spasms.).   Yes Historical Provider, MD  Oxycodone HCl 10 MG TABS Take 10 mg by mouth every 4 (four) hours as needed (for pain.).   Yes Historical Provider, MD  pantoprazole (PROTONIX) 40 MG tablet Take 40 mg by mouth daily.   Yes Historical Provider, MD  polyethylene glycol (MIRALAX / GLYCOLAX) packet Take 17 g by mouth daily.   Yes Historical Provider, MD  sennosides-docusate sodium (SENOKOT-S) 8.6-50 MG tablet Take 2 tablets by mouth 2 (two) times daily.   Yes Historical Provider, MD  Tamsulosin HCl (FLOMAX) 0.4 MG CAPS Take 0.4 mg by mouth at bedtime.    Yes Historical Provider, MD    Allergies:  Allergies  Allergen Reactions  . Iodine Solution (Povidone Iodine) Rash    Topical only  Past Medical History: Past Medical History  Diagnosis Date  . HTN (hypertension)   . PVD (peripheral vascular disease)   . COPD (chronic obstructive pulmonary disease)   . Diabetes mellitus     IDDM  . S/P CABG x 6 2003    Dr Cornelius Moras, Myoview low risk 2009  . Gout   . Paroxysmal atrial fibrillation   . Hypothyroidism   . ICD (implantable cardiac defibrillator) discharge 5/11    MDT  . Chronic renal insufficiency, stage III (moderate)   . Chronic systolic CHF (congestive heart failure)     EF 25-30% echo 12/12  . S/P angioplasty, 11/27/12 -Lt  below the knee popliteal artery 11/28/2012    Past Surgical History  Procedure Laterality Date  . Coronary artery bypass graft    . Cardiac catheterization    . Amputation  12/08/2012    Procedure: AMPUTATION BELOW KNEE;  Surgeon: Eldred Manges, MD;  Location: [facility] OR;  Service: Orthopedics;  Laterality: Left;    Social History:  reports that he quit smoking about 9 years ago. His smoking use included Cigarettes. He smoked 0.00 packs per day. He has never used smokeless tobacco. He reports that he does not drink alcohol or use illicit drugs.  Living Situation: He is currently in a skilled nursing facility Activity Level: Activity level is unclear due to his recent amputation   Family History:  Family History  Problem Relation Age of Onset  . Heart disease Father   . Cancer Brother      Review of Systems - History obtained from the patient General ROS: positive for  - fatigue Psychological ROS: negative Ophthalmic ROS: negative ENT ROS: negative Allergy and Immunology ROS: negative Hematological and Lymphatic ROS: negative Endocrine ROS: negative Respiratory ROS: no cough, shortness of breath, or wheezing Cardiovascular ROS: no chest pain or dyspnea on exertion Gastrointestinal ROS: no abdominal pain, change in bowel habits, or black or bloody stools Genito-Urinary ROS: no dysuria, trouble voiding, or hematuria Musculoskeletal ROS: as in hpi Neurological ROS: no TIA or stroke symptoms Dermatological ROS: as in hpi  Physical Examination  Filed Vitals:   03/12/13 1123 03/12/13 1239 03/12/13 1452 03/12/13 1539  BP:  124/55 133/70 139/66  Pulse:  82 92 88  Temp:  97.4 F (36.3 C)  97.6 F (36.4 C)  TempSrc:  Oral  Oral  Resp:    16  SpO2: 98% 95% 99% 100%    General appearance: alert, cooperative, appears stated age and no distress Head: Normocephalic, without obvious abnormality, atraumatic Eyes: conjunctivae/corneas clear. PERRL, EOM's intact.  Throat: lips, mucosa,  and tongue normal; teeth and gums normal Neck: no adenopathy, no carotid bruit, no JVD, supple, symmetrical, trachea midline and thyroid not enlarged, symmetric, no tenderness/mass/nodules Resp: clear to auscultation bilaterally Cardio: regular rate and rhythm, S1, S2 normal, no murmur, click, rub or gallop GI: soft, non-tender; bowel sounds normal; no masses,  no organomegaly Extremities: He status post left below-knee amputation. The stump does not show any wounds. Right lower extremity reveals erythema and warmth. Calf tenderness is present. There is an open wound in the heel. No active drainage is noted, however, the area is foul-smelling. Pulses: Poorly palpable Skin: Erythema over the right lower extremity with skin breakdown Neurologic: He is alert. He does not have any cranial nerve deficits. Motor strength is equal bilaterally  Laboratory Data: Results for orders placed during the hospital encounter of 03/12/13 (from the past 48 hour(s))  GLUCOSE, CAPILLARY  Status: Abnormal   Collection Time    03/12/13 11:06 AM      Result Value Range   Glucose-Capillary 141 (*) 70 - 99 mg/dL  CBC WITH DIFFERENTIAL     Status: Abnormal   Collection Time    03/12/13 11:30 AM      Result Value Range   WBC 13.7 (*) 4.0 - 10.5 K/uL   RBC 3.76 (*) 4.22 - 5.81 MIL/uL   Hemoglobin 9.5 (*) 13.0 - 17.0 g/dL   HCT 82.9 (*) 56.2 - 13.0 %   MCV 78.7  78.0 - 100.0 fL   MCH 25.3 (*) 26.0 - 34.0 pg   MCHC 32.1  30.0 - 36.0 g/dL   RDW 86.5 (*) 78.4 - 69.6 %   Platelets 240  150 - 400 K/uL   Neutrophils Relative 85 (*) 43 - 77 %   Neutro Abs 11.7 (*) 1.7 - 7.7 K/uL   Lymphocytes Relative 7 (*) 12 - 46 %   Lymphs Abs 1.0  0.7 - 4.0 K/uL   Monocytes Relative 7  3 - 12 %   Monocytes Absolute 1.0  0.1 - 1.0 K/uL   Eosinophils Relative 1  0 - 5 %   Eosinophils Absolute 0.1  0.0 - 0.7 K/uL   Basophils Relative 0  0 - 1 %   Basophils Absolute 0.0  0.0 - 0.1 K/uL  BASIC METABOLIC PANEL     Status:  Abnormal   Collection Time    03/12/13 11:30 AM      Result Value Range   Sodium 138  135 - 145 mEq/L   Potassium 2.9 (*) 3.5 - 5.1 mEq/L   Chloride 98  96 - 112 mEq/L   CO2 26  19 - 32 mEq/L   Glucose, Bld 173 (*) 70 - 99 mg/dL   BUN 50 (*) 6 - 23 mg/dL   Creatinine, Ser 2.95 (*) 0.50 - 1.35 mg/dL   Calcium 9.0  8.4 - 28.4 mg/dL   GFR calc non Af Amer 30 (*) >90 mL/min   GFR calc Af Amer 35 (*) >90 mL/min   Comment:            The eGFR has been calculated     using the CKD EPI equation.     This calculation has not been     validated in all clinical     situations.     eGFR's persistently     <90 mL/min signify     possible Chronic Kidney Disease.  PROTIME-INR     Status: Abnormal   Collection Time    03/12/13  1:36 PM      Result Value Range   Prothrombin Time 30.9 (*) 11.6 - 15.2 seconds   INR 3.19 (*) 0.00 - 1.49    Radiology Reports: Dg Foot Complete Right  03/12/2013  *RADIOLOGY REPORT*  Clinical Data: Swollen right foot.  RIGHT FOOT COMPLETE - 3+ VIEW  Comparison: 07/19/2012  Findings: Three views of the right foot were obtained.  Again noted are large erosions involving the distal aspect of the first metatarsal bone.  Again noted are erosions of the second toe DIP joint.  Alignment of the right foot has not changed.  There is no evidence for an acute fracture or dislocation.  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   Original Report Authenticated By: Richarda Overlie, M.D.     Problem List  Principal Problem:   Cellulitis  of right leg Active Problems:   Hx of CABG x 6 2003   ICD in place, MDT May 2011   PAF (paroxysmal atrial fibrillation)   Diabetes mellitus type 2, insulin dependent   CKD (chronic kidney disease) stage 4, GFR 15-29 ml/min   Anemia due to chronic illness   Ischemic cardiomyopathy, EF 25% 2D 12/12   Assessment: This is a 78 year old, Caucasian male, who is the here with pain in the right leg and  appears to have cellulitis of the right lower extremity. The source for the cellulitis is probably the nonhealing wound in the right heel. DVT cannot be ruled out. There is no fluctuation and it doesn't appear that there is any pus collection in that area.  Plan: #1 right lower extremity cellulitis: We will treat him with broad-spectrum antibiotics with vancomycin and Zosyn due to this the nonhealing wound and his history of diabetes. Get a venous Doppler of the right leg. Wound care consultation will be obtained. Depending on his progress orthopedic input may also be needed. Pain control will be provided. Blood cultures will be followed up on.  #2 history of paroxysmal atrial fibrillation on chronic anticoagulation: His med list from the skilled nursing facility did not show warfarin. It is unclear if this was incomplete and some sheets were not sent or if the patient has indeed been taken off of warfarin. However, his INR came back at greater than 3, so, it appears, that he did get Coumadin within the last few days. I have called his cardiology group and have requested records from them. We will hold any anticoagulation with Coumadin for now. He'll be monitored on telemetry.  #3 history of, diabetes, type II: Continue with Lantus and sliding scale. HbA1c will be checked.  #4 history of coronary artery disease with ischemic cardiomyopathy s/p AICD: He appears to be euvolemic currently. Continue to monitor closely.   #5 history of peripheral vascular disease: Recent angiogram of the right leg does not show any significant stenosis. Medical management was recommended by vascular surgery. However, I am afraid that if the patient's the wound does not heal he is looking at the amputation of his right leg as well.  #6 history of chronic kidney disease stage IV: Creatinine, appears to be close to baseline. Continue to monitor.  #7 history of anemia of chronic disease: Hemoglobin will be monitored closely.    #8 hypokalemia: Potassium will be repleted cautiously   DVT Prophylaxis: INR is supratherapeutic. Hold anticoagulants Code Status: Full code Family Communication: Discussed with the patient. No family at bedside  Disposition Plan:  Will likely need to go back to SNF.   Further management decisions will depend on results of further testing and patient's response to treatment.  [facility]  [facility] Pager [phone]  If 7PM-7AM, please contact night-coverage [email or web address] Password [email or web address]  03/12/2013, 4:21 PM

## 2013-03-13 ENCOUNTER — Ambulatory Visit: Payer: MEDICARE | Admitting: Vascular Surgery

## 2013-03-13 DIAGNOSIS — M7989 Other specified soft tissue disorders: Secondary | ICD-10-CM

## 2013-03-13 LAB — CBC
MCH: 24.9 pg — ABNORMAL LOW (ref 26.0–34.0)
Platelets: 247 10*3/uL (ref 150–400)
RBC: 3.45 MIL/uL — ABNORMAL LOW (ref 4.22–5.81)
WBC: 14.8 10*3/uL — ABNORMAL HIGH (ref 4.0–10.5)

## 2013-03-13 LAB — COMPREHENSIVE METABOLIC PANEL
BUN: 46 mg/dL — ABNORMAL HIGH (ref 6–23)
CO2: 27 mEq/L (ref 19–32)
Chloride: 101 mEq/L (ref 96–112)
Creatinine, Ser: 1.92 mg/dL — ABNORMAL HIGH (ref 0.50–1.35)
GFR calc Af Amer: 38 mL/min — ABNORMAL LOW (ref >90)
GFR calc non Af Amer: 32 mL/min — ABNORMAL LOW (ref >90)
Glucose, Bld: 91 mg/dL (ref 70–99)
Total Bilirubin: 1 mg/dL (ref 0.3–1.2)

## 2013-03-13 LAB — GLUCOSE, CAPILLARY
Glucose-Capillary: 88 mg/dL (ref 70–99)
Glucose-Capillary: 123 mg/dL — ABNORMAL HIGH (ref 70–99)
Glucose-Capillary: 192 mg/dL — ABNORMAL HIGH (ref 70–99)
Glucose-Capillary: 137 mg/dL — ABNORMAL HIGH (ref 70–99)

## 2013-03-13 LAB — MAGNESIUM: Magnesium: 2.2 mg/dL (ref 1.5–2.5)

## 2013-03-13 LAB — HEMOGLOBIN A1C: Hgb A1c MFr Bld: 8 % — ABNORMAL HIGH (ref <5.7)

## 2013-03-13 MED ORDER — POTASSIUM CHLORIDE CRYS ER 20 MEQ PO TBCR
20.0000 meq | EXTENDED_RELEASE_TABLET | Freq: Two times a day (BID) | ORAL | Status: AC
  Administered 2013-03-13 (×2): 20 meq via ORAL
  Filled 2013-03-13 (×2): qty 1

## 2013-03-13 MED ORDER — ADULT MULTIVITAMIN W/MINERALS CH
1.0000 | ORAL_TABLET | Freq: Every day | ORAL | Status: DC
  Administered 2013-03-13 – 2013-03-20 (×6): 1 via ORAL
  Filled 2013-03-13 (×8): qty 1

## 2013-03-13 MED ORDER — GLUCERNA SHAKE PO LIQD
237.0000 mL | Freq: Two times a day (BID) | ORAL | Status: DC
  Administered 2013-03-13 – 2013-03-20 (×9): 237 mL via ORAL
  Filled 2013-03-13 (×16): qty 237

## 2013-03-13 MED ORDER — JUVEN PO PACK
1.0000 | PACK | Freq: Two times a day (BID) | ORAL | Status: DC
  Administered 2013-03-13 – 2013-03-18 (×7): 1 via ORAL
  Filled 2013-03-13 (×11): qty 1

## 2013-03-13 NOTE — Consult Note (Signed)
Reason for Consult:Right posterior ankle infected wound Referring Physician: Rama,   [name] is an 78 y.o. male.  HPI: Patient known to Dr.blackman seen last week in office . At that time had a small bleeding ulceration on the right posterior heel . Wound care was instructed. Patent also with history of left BKA and ambulates with prosthesis .   Past Medical History  Diagnosis Date  . HTN (hypertension)   . PVD (peripheral vascular disease)   . COPD (chronic obstructive pulmonary disease)   . Diabetes mellitus     IDDM  . S/P CABG x 6 2003    Dr Cornelius Moras, Myoview low risk 2009  . Gout   . Paroxysmal atrial fibrillation   . Hypothyroidism   . ICD (implantable cardiac defibrillator) discharge 5/11    MDT  . Chronic renal insufficiency, stage III (moderate)   . Chronic systolic CHF (congestive heart failure)     EF 25-30% echo 12/12  . S/P angioplasty, 11/27/12 -Lt below the knee popliteal artery 11/28/2012    Past Surgical History  Procedure Laterality Date  . Coronary artery bypass graft    . Cardiac catheterization    . Amputation  12/08/2012    Procedure: AMPUTATION BELOW KNEE;  Surgeon: Eldred Manges, MD;  Location: [facility] OR;  Service: Orthopedics;  Laterality: Left;    Family History  Problem Relation Age of Onset  . Heart disease Father   . Cancer Brother     Social History:  reports that he quit smoking about 9 years ago. His smoking use included Cigarettes. He smoked 0.00 packs per day. He has never used smokeless tobacco. He reports that he does not drink alcohol or use illicit drugs.  Allergies:  Allergies  Allergen Reactions  . Iodine Solution (Povidone Iodine) Rash    Topical only    Medications: I have reviewed the patient's current medications.  Results for orders placed during the hospital encounter of 03/12/13 (from the past 48 hour(s))  GLUCOSE, CAPILLARY     Status: Abnormal   Collection Time    03/12/13 11:06 AM      Result Value Range   Glucose-Capillary 141 (*) 70 - 99 mg/dL  CBC WITH DIFFERENTIAL     Status: Abnormal   Collection Time    03/12/13 11:30 AM      Result Value Range   WBC 13.7 (*) 4.0 - 10.5 K/uL   RBC 3.76 (*) 4.22 - 5.81 MIL/uL   Hemoglobin 9.5 (*) 13.0 - 17.0 g/dL   HCT 81.1 (*) 91.4 - 78.2 %   MCV 78.7  78.0 - 100.0 fL   MCH 25.3 (*) 26.0 - 34.0 pg   MCHC 32.1  30.0 - 36.0 g/dL   RDW 95.6 (*) 21.3 - 08.6 %   Platelets 240  150 - 400 K/uL   Neutrophils Relative 85 (*) 43 - 77 %   Neutro Abs 11.7 (*) 1.7 - 7.7 K/uL   Lymphocytes Relative 7 (*) 12 - 46 %   Lymphs Abs 1.0  0.7 - 4.0 K/uL   Monocytes Relative 7  3 - 12 %   Monocytes Absolute 1.0  0.1 - 1.0 K/uL   Eosinophils Relative 1  0 - 5 %   Eosinophils Absolute 0.1  0.0 - 0.7 K/uL   Basophils Relative 0  0 - 1 %   Basophils Absolute 0.0  0.0 - 0.1 K/uL  BASIC METABOLIC PANEL     Status: Abnormal   Collection  Time    03/12/13 11:30 AM      Result Value Range   Sodium 138  135 - 145 mEq/L   Potassium 2.9 (*) 3.5 - 5.1 mEq/L   Chloride 98  96 - 112 mEq/L   CO2 26  19 - 32 mEq/L   Glucose, Bld 173 (*) 70 - 99 mg/dL   BUN 50 (*) 6 - 23 mg/dL   Creatinine, Ser 1.61 (*) 0.50 - 1.35 mg/dL   Calcium 9.0  8.4 - 09.6 mg/dL   GFR calc non Af Amer 30 (*) >90 mL/min   GFR calc Af Amer 35 (*) >90 mL/min   Comment:            The eGFR has been calculated     using the CKD EPI equation.     This calculation has not been     validated in all clinical     situations.     eGFR's persistently     <90 mL/min signify     possible Chronic Kidney Disease.  PROTIME-INR     Status: Abnormal   Collection Time    03/12/13  1:36 PM      Result Value Range   Prothrombin Time 30.9 (*) 11.6 - 15.2 seconds   INR 3.19 (*) 0.00 - 1.49  GLUCOSE, CAPILLARY     Status: Abnormal   Collection Time    03/12/13  5:10 PM      Result Value Range   Glucose-Capillary 163 (*) 70 - 99 mg/dL   Comment 1 Notify RN     Comment 2 Documented in Chart    HEMOGLOBIN A1C      Status: Abnormal   Collection Time    03/12/13  5:14 PM      Result Value Range   Hemoglobin A1C 8.0 (*) <5.7 %   Comment: (NOTE)                                                                               According to the ADA Clinical Practice Recommendations for 2011, when     HbA1c is used as a screening test:      >=6.5%   Diagnostic of Diabetes Mellitus               (if abnormal result is confirmed)     5.7-6.4%   Increased risk of developing Diabetes Mellitus     References:Diagnosis and Classification of Diabetes Mellitus,Diabetes     Care,2011,34(Suppl 1):S62-S69 and Standards of Medical Care in             Diabetes - 2011,Diabetes Care,2011,34 (Suppl 1):S11-S61.   Mean Plasma Glucose 183 (*) <117 mg/dL  GLUCOSE, CAPILLARY     Status: Abnormal   Collection Time    03/12/13 10:22 PM      Result Value Range   Glucose-Capillary 101 (*) 70 - 99 mg/dL  PROTIME-INR     Status: Abnormal   Collection Time    03/13/13  4:35 AM      Result Value Range   Prothrombin Time 23.8 (*) 11.6 - 15.2 seconds   INR 2.24 (*) 0.00 - 1.49  COMPREHENSIVE METABOLIC PANEL  Status: Abnormal   Collection Time    03/13/13  4:35 AM      Result Value Range   Sodium 139  135 - 145 mEq/L   Potassium 3.1 (*) 3.5 - 5.1 mEq/L   Chloride 101  96 - 112 mEq/L   CO2 27  19 - 32 mEq/L   Glucose, Bld 91  70 - 99 mg/dL   BUN 46 (*) 6 - 23 mg/dL   Creatinine, Ser 1.61 (*) 0.50 - 1.35 mg/dL   Calcium 8.9  8.4 - 09.6 mg/dL   Total Protein 6.3  6.0 - 8.3 g/dL   Albumin 2.0 (*) 3.5 - 5.2 g/dL   AST 18  0 - 37 U/L   ALT 5  0 - 53 U/L   Alkaline Phosphatase 100  39 - 117 U/L   Total Bilirubin 1.0  0.3 - 1.2 mg/dL   GFR calc non Af Amer 32 (*) >90 mL/min   GFR calc Af Amer 38 (*) >90 mL/min   Comment:            The eGFR has been calculated     using the CKD EPI equation.     This calculation has not been     validated in all clinical     situations.     eGFR's persistently     <90 mL/min signify      possible Chronic Kidney Disease.  CBC     Status: Abnormal   Collection Time    03/13/13  4:35 AM      Result Value Range   WBC 14.8 (*) 4.0 - 10.5 K/uL   RBC 3.45 (*) 4.22 - 5.81 MIL/uL   Hemoglobin 8.6 (*) 13.0 - 17.0 g/dL   HCT 04.5 (*) 40.9 - 81.1 %   MCV 78.6  78.0 - 100.0 fL   MCH 24.9 (*) 26.0 - 34.0 pg   MCHC 31.7  30.0 - 36.0 g/dL   RDW 91.4 (*) 78.2 - 95.6 %   Platelets 247  150 - 400 K/uL  MAGNESIUM     Status: None   Collection Time    03/13/13  4:35 AM      Result Value Range   Magnesium 2.2  1.5 - 2.5 mg/dL  GLUCOSE, CAPILLARY     Status: None   Collection Time    03/13/13  7:40 AM      Result Value Range   Glucose-Capillary 88  70 - 99 mg/dL  GLUCOSE, CAPILLARY     Status: Abnormal   Collection Time    03/13/13 11:58 AM      Result Value Range   Glucose-Capillary 123 (*) 70 - 99 mg/dL    Dg Foot Complete Right  03/12/2013  *RADIOLOGY REPORT*  Clinical Data: Swollen right foot.  RIGHT FOOT COMPLETE - 3+ VIEW  Comparison: 07/19/2012  Findings: Three views of the right foot were obtained.  Again noted are large erosions involving the distal aspect of the first metatarsal bone.  Again noted are erosions of the second toe DIP joint.  Alignment of the right foot has not changed.  There is no evidence for an acute fracture or dislocation.  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   Original Report Authenticated By: Richarda Overlie, M.D.     ROS Blood pressure 143/70, pulse 79, temperature 97.9 F (36.6 C), temperature source Oral, resp. rate 20, height 6\' 2"  (1.88 m), weight 81.647 kg (180  lb), SpO2 100.00%. Physical Exam  Constitutional: He is oriented to person, place, and time. He appears well-developed and well-nourished.  HENT:  Head: Normocephalic.  Eyes: EOM are normal.  Cardiovascular: Normal rate.   Respiratory: Effort normal.  Neurological: He is alert and oriented to person, place, and time.  Skin:  Right  foot swelling dorsal . Right achilles insertional region with large wound that extends deep to bone. Purulence present.  Psychiatric: He has a normal mood and affect.    Assessment/Plan: HTN CHF DMII IDDM CAD PVD history of paroxysmal atrial fibrillation on chronic anticoagulation. Supra therapeutic anticoags being held Right heel wound with infection/ sepsis. Achilles most likely ruptured  Secondary to infection. Achilles rupture most likely source of right calf pain  Will plan to take patient to OR in AM for I&D of right posterior heel/ Achilles insertion area  Patient seen and evaluated  Dr. Magnus Ivan and myself .  NPO after midnight tonight  Richardean Canal 03/13/2013, 4:44 PM

## 2013-03-13 NOTE — Progress Notes (Cosign Needed)
[facility]  NAME:  [name], [name]               ACCOUNT NO.:  [id number]  MEDICAL RECORD NO.:  [id number]      DATE OF BIRTH:  [birth year]  PHYSICIAN:  [name], M.D.      VISIT DATE:                                  OFFICE VISIT   CHIEF COMPLAINT:  Review of right Achilles tendon area wound.  HISTORY OF PRESENT ILLNESS:  [name] came down from a skilled facility in [place], [place] for review of wound over his right Achilles tendon area.  He was kindly referred through the offices of [name] Orth[facility].  The history provided is that this gentleman underwent a left below-knee amputation for lower extremity nonhealing ulcers on December 08, 2012.  I have his discharge summary from the hospitalization, which noted a stage II pressure ulcer on the right heel.  I do not have an exact course of advance after although he was seen by both [name] and [name] in followup in the office.  [name] on March 04, 2013, noted that he had been working in therapy and then suddenly felt a "blistering sensation with sanguinous drainage." It was felt that he possibly had ruptured his Achilles tendon and there was active venous bleeding.  He was sent here for review of this area.  PAST MEDICAL HISTORY: 1. Left below-knee amputation for nonhealing diabetic ulcers in the     left foot and ankle with a degree of arterial insufficiency,     although previously reviewed by [name] and on the right was felt     to have sufficient circulation for wound healing.  Previous lower     extremity arterial duplex [facility] had shown     biphasic waveforms in the common femoral, deep femoral, and     superficial femoral arteries, monophasic in the superficial     femoral, mid, and superficial femoral distal, and monophasic flows     in the popliteal artery.  No flow could be listed distally. 2. Type 2 diabetes with macrovascular  complications. 3. COPD. 4. Hyperlipidemia. 5. History of coronary artery disease. 6. Hypertension. 7. Hypothyroidism. 8. AFib. 9. Chronic renal insufficiency. 10.Gout. 11.Probable diabetic peripheral neuropathy.  PAST SURGICAL HISTORY: 1. Left BKA. 2. Hemorrhoidectomy. 3. CABG in June 2004.  MEDICATION LIST:  Reviewed.  PHYSICAL EXAMINATION:  VITAL SIGNS:  Temperature 98.3, pulse 74, respirations 16, blood pressure 119/57. EXTREMITIES:  The right posterior heel area showed a necrotic malodorous wound measuring 5.5 x 3.5 x 0.7.  The patient could dorsiflex and plantar flex his ankle, therefore if he has torn his Achilles, it is probably not complete, in any case, I could not visualize the tendon. The most concerning thing here was a considerable degree of pain up the back of his leg with erythema and swelling.  When I ran my hand on the back of his leg, he literally came off the stretcher. RESPIRATORY:  Clear air entry bilaterally. CARDIAC:  Heart sounds were distant.  There was no signs of congestive heart failure.  IMPRESSION: 1. Necrotic wound over the Achilles tendon area.  I think this is     probably grossly infected at this point certainly much worse than  described by [name] a little over a week ago.  The pain and     erythema extending up his leg made me worry about extending     infection either cellulitis or fasciitis.  I referred him to [facility] ER for admission to hospital for IV antibiotics, probably     imaging of the lower leg.  He is going to be required to determine     the extent of this infection.  Duplex ultrasound to rule out DVT,     etc.  This was extensively discussed with the patient, who agreed     to proceeding in that direction.          ______________________________ [name], M.D.     MGR/MEDQ  D:  03/12/2013  T:  03/13/2013  Job:  [id number]

## 2013-03-13 NOTE — Consult Note (Signed)
I have seen and examined the patient and agree with Rexene Edison PA-C and his note.

## 2013-03-13 NOTE — Progress Notes (Signed)
*  Preliminary Results* Right lower extremity venous duplex completed. Visualized veins of the right lower extremity are negative for deep vein thrombosis. Limited evaluation of right calf veins due to poor patient cooperation. No evidence of right Baker's cyst.  03/13/2013 12:11 PM Gertie Fey, RDMS, RDCS

## 2013-03-13 NOTE — Progress Notes (Signed)
[facility] PROGRESS NOTE  [name] NWG:[id number] DOB: 01-Oct-1935 DOA: 03/12/2013 PCP: Colette Ribas, MD  Brief narrative: [name] is an 78 y.o. male with a past medical history of coronary artery disease, ischemic cardiomyopathy with an EF of 20% status post AICD placement, diabetes, hypertension, peripheral vascular disease status post left BKA a few months ago who was sent to rehabilitation and who presented to the hospital on 03/12/2013 with a nonhealing diabetic wound of his right heel with overlying cellulitis. Outpatient angiogram reportedly without significant stenosis.  Assessment/Plan: Principal Problem:   Cellulitis of right leg -Admitted and put on empiric broad-spectrum antibiotics including vancomycin and Zosyn. -Right lower extremity Doppler negative for DVT. Plain films of the foot show no change in chronic erosions. -Wound care nurse consultation pending.  Will need orthopedic surgery evaluation for debridement, possible amputation. -Followup on blood cultures Active Problems:   Hypothyroidism -Continue Synthroid.   Hypokalemia -Replacing.   Hx of CABG x 6 2003 -Continue statin   ICD in place, MDT May 2011 -Frequent runs of nonsustained V. tach on telemetry noted. No discharge of AICD noted.   PAF (paroxysmal atrial fibrillation) -Maintained on chronic Coumadin therapy.  Taken off per daughter's recollection. -INR supratherapeutic on admission.   Diabetes mellitus type 2, insulin dependent -Continue Lantus insulin and sliding scale. CBGs 88-163. -Hemoglobin A1c 8%.   CKD (chronic kidney disease) stage 4, GFR 15-29 ml/min -Creatinine at usual baseline values. Continue to monitor.   Anemia due to chronic illness -Hemoglobin stable. No indication for transfusion. -Continue iron supplement.   Ischemic cardiomyopathy, EF 25% 2D 12/12 -Monitor intake and outputs closely. Keep euvolemic. -Continue Lasix, hydralazine, Isordil.  Code Status:  Full code  Family Communication: Discussed with the patient and his daughter at the bedside. Disposition Plan: Will likely need to go back to SNF.     Medical Consultants: Orthopedic Surgery  Other Consultants:  Wound care RN  Anti-infectives:  Vancomycin 03/12/2013--->  Zosyn 03/12/2013--->  HPI/Subjective: [name] is having significant pain at his right heel and lower leg.  No N/V.  Appetite poor.  Cannot say when his bowels moved last.  No chest pain or dyspnea.  Objective: Filed Vitals:   03/12/13 2138 03/12/13 2226 03/13/13 0435 03/13/13 0559  BP: 114/63 123/59 115/50 123/52  Pulse: 96   87  Temp: 97.9 F (36.6 C)   98.2 F (36.8 C)  TempSrc: Oral   Oral  Resp: 18   20  Height:      Weight:      SpO2: 95%  97% 98%    Intake/Output Summary (Last 24 hours) at 03/13/13 1250 Last data filed at 03/13/13 1036  Gross per 24 hour  Intake    360 ml  Output    925 ml  Net   -565 ml    Exam: Gen:  NAD Cardiovascular:  Tachy with irregularity, No M/R/G Respiratory:  Lungs CTAB Gastrointestinal:  Abdomen soft, NT/ND, + BS Extremities:  Left BKA, right heel with dusky appearance, central ulceration with foul smelling drainage and surrounding eschar.  Erythema anterior lower leg.  Data Reviewed: Basic Metabolic Panel:  Recent Labs Lab 03/12/13 1130 03/13/13 0435  NA 138 139  K 2.9* 3.1*  CL 98 101  CO2 26 27  GLUCOSE 173* 91  BUN 50* 46*  CREATININE 2.05* 1.92*  CALCIUM 9.0 8.9  MG  --  2.2   GFR Estimated Creatinine Clearance: 38.4 ml/min (by C-G formula based on Cr  of 1.92). Liver Function Tests:  Recent Labs Lab 03/13/13 0435  AST 18  ALT 5  ALKPHOS 100  BILITOT 1.0  PROT 6.3  ALBUMIN 2.0*   Coagulation profile  Recent Labs Lab 04-10-13 1336 03/13/13 0435  INR 3.19* 2.24*    CBC:  Recent Labs Lab Apr 10, 2013 1130 03/13/13 0435  WBC 13.7* 14.8*  NEUTROABS 11.7*  --   HGB 9.5* 8.6*  HCT 29.6* 27.1*  MCV 78.7 78.6  PLT  240 247   CBG:  Recent Labs Lab 04-10-13 1106 04/10/2013 1710 04/10/2013 2222 03/13/13 0740 03/13/13 1158  GLUCAP 141* 163* 101* 88 123*   Hgb A1c  Recent Labs  10-Apr-2013 1714  HGBA1C 8.0*   Microbiology No results found for this or any previous visit (from the past 240 hour(s)).   Procedures and Diagnostic Studies: Dg Foot Complete Right  04/10/2013  *RADIOLOGY REPORT*  Clinical Data: Swollen right foot.  RIGHT FOOT COMPLETE - 3+ VIEW  Comparison: 07/19/2012  Findings: Three views of the right foot were obtained.  Again noted are large erosions involving the distal aspect of the first metatarsal bone.  Again noted are erosions of the second toe DIP joint.  Alignment of the right foot has not changed.  There is no evidence for an acute fracture or dislocation.  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   Original Report Authenticated By: Richarda Overlie, M.D.     Scheduled Meds: . atorvastatin  20 mg Oral QHS  . calcium carbonate  1 tablet Oral BID WC  . feeding supplement  237 mL Oral BID BM  . ferrous fumarate-b12-vitamic C-folic acid  1 capsule Oral Daily  . furosemide  40 mg Oral BID  . gabapentin  100 mg Oral TID  . hydrALAZINE  25 mg Oral TID  . insulin aspart  0-15 Units Subcutaneous TID WC  . insulin aspart  0-5 Units Subcutaneous QHS  . insulin glargine  20 Units Subcutaneous BID  . isosorbide dinitrate  20 mg Oral TID  . levothyroxine  50 mcg Oral BH-q7a  . pantoprazole  40 mg Oral Daily  . piperacillin-tazobactam (ZOSYN)  IV  3.375 g Intravenous Q8H  . polyethylene glycol  17 g Oral Daily  . senna-docusate  2 tablet Oral BID  . sodium chloride  3 mL Intravenous Q12H  . sodium chloride  3 mL Intravenous Q12H  . tamsulosin  0.4 mg Oral QHS  . vancomycin  1,000 mg Intravenous Q24H   Continuous Infusions:   Time spent: 35 minutes.   LOS: 1 day   Cedra Villalon  [facility] Pager [phone].  If 8PM-8AM,  please contact night-coverage at [email or web address], password [facility] 03/13/2013, 12:50 PM

## 2013-03-13 NOTE — Progress Notes (Signed)
CARE MANAGEMENT NOTE 03/13/2013  Patient:  [name], [name]   Account Number:  [id number]  Date Initiated:  03/13/2013  Documentation initiated by:  DAVIS,RHONDA  Subjective/Objective Assessment:   pt with hx of bka of the left leg, now presents with ulcerations on the right lower leg, poss sepsis     Action/Plan:   From snf PATIENT FROM [facility] IN [place].   Anticipated DC Date:  03/16/2013   Anticipated DC Plan:  SKILLED NURSING FACILITY  In-house referral  Clinical Social Worker      DC Planning Services  NA      [facility] Choice  NA   Choice offered to / List presented to:  NA   DME arranged  NA      DME agency  NA     HH arranged  NA      HH agency  NA   Status of service:  In process, will continue to follow Medicare Important Message given?  YES (If response is "NO", the following Medicare IM given date fields will be blank) Date Medicare IM given:  03/12/2013 Date Additional Medicare IM given:    Discharge Disposition:    Per UR Regulation:  Reviewed for med. necessity/level of care/duration of stay  If discussed at Long Length of Stay Meetings, dates discussed:    Comments:  [id number]/Rhonda Davis,RN,BSN,CCM chart reviewed patient is from snf in [place], will follow for dc needs none present at time of review.

## 2013-03-13 NOTE — Progress Notes (Signed)
On-call notified of irregular heart rhythm as well as vitals, and potassium value. New orders received.

## 2013-03-13 NOTE — Consult Note (Signed)
WOC consult Note Reason for Consult:Right heel ulceration with cellulitis Wound type:pressure vs ischemic Pressure Ulcer POA: Yes Measurement: 7cm x 6cm necrotic area with blue hued "halo"  of boggy tissue measuring 4cm x 7cm. No crepitus, foul odor from tissue noted. Wound bed: as above Drainage (amount, consistency, odor) Scant serous Periwound:as described above Dressing procedure/placement/frequency:I will initiate saline moistened gauze and elevation of the heel.  Dr. Darnelle Catalan indicated that she has consulted with Dr. Magnus Ivan from Orthopedics and I agree with that POC.  Consideration is for limb salvage vs amputation. I will not follow.  Please re-consult if needed. Thanks, Ladona Mow, MSN, RN, [facility], CWOCN [phone])

## 2013-03-13 NOTE — Progress Notes (Signed)
INITIAL NUTRITION ASSESSMENT  DOCUMENTATION CODES Per approved criteria  -Not Applicable   INTERVENTION: Provide Glucerna BID Provide Snacks BID Provide Juven BID Provide Multivitamin with minerals daily  Recommend appetite stimulant Recommend antiemetic  NUTRITION DIAGNOSIS: Unintentional wt loss related to poor appetite and nausea as evidenced by 17% wt loss in less than 3 months.   Goal: Pt to meet >/= 90% of their estimated nutrition needs  Monitor:  Wt PO intake Labs  Reason for Assessment:  Poor po intake per RN  78 y.o. male  Admitting Dx: Cellulitis of right leg  ASSESSMENT: 78 y.o. male with a past medical history of coronary artery disease, ischemic cardiomyopathy with an EF of 20% status post AICD placement, diabetes, hypertension, peripheral vascular disease status post left BKA a few months ago who was sent to rehabilitation and who presented to the hospital on 03/12/2013 with a nonhealing diabetic wound of his right heel with overlying cellulitis.  Pt reports that he has had a poor appetite since February when the smell of food started making him nauseous. Pt states he eats a lot of peanut butter and crackers. Per pt, his usual body weight is 200 lbs. Encouraged pt to eat and drink more to prevent further weight loss and increased weakness.   Height: Ht Readings from Last 1 Encounters:  03/12/13 6\' 2"  (1.88 m)    Weight: Wt Readings from Last 1 Encounters:  03/12/13 180 lb (81.647 kg)    Ideal Body Weight: 190 lbs  % Ideal Body Weight: 95 %  Wt Readings from Last 10 Encounters:  03/12/13 180 lb (81.647 kg)  02/26/13 180 lb (81.647 kg)  02/26/13 180 lb (81.647 kg)  02/20/13 180 lb (81.647 kg)  01/06/13 221 lb (100.245 kg)  12/21/12 216 lb 4.3 oz (98.1 kg)  11/29/12 212 lb 8.4 oz (96.4 kg)  11/29/12 212 lb 8.4 oz (96.4 kg)  11/29/12 212 lb 8.4 oz (96.4 kg)  07/23/12 200 lb 14.4 oz (91.128 kg)    Usual Body Weight: 200 lbs  % Usual Body  Weight: 90%  BMI:  Body mass index is 23.1 kg/(m^2).  Estimated Nutritional Needs: Kcal: 1610-9604 Protein: 98-114 grams Fluid: 2.4 L  Skin: Cellulitis on right leg, stage 3 ulcer on right heel  Diet Order: Carb Control  EDUCATION NEEDS: -No education needs identified at this time   Intake/Output Summary (Last 24 hours) at 03/13/13 1421 Last data filed at 03/13/13 1036  Gross per 24 hour  Intake    360 ml  Output    925 ml  Net   -565 ml    Last BM: 4/9  Labs:   Recent Labs Lab 03/12/13 1130 03/13/13 0435  NA 138 139  K 2.9* 3.1*  CL 98 101  CO2 26 27  BUN 50* 46*  CREATININE 2.05* 1.92*  CALCIUM 9.0 8.9  MG  --  2.2  GLUCOSE 173* 91    CBG (last 3)   Recent Labs  03/12/13 2222 03/13/13 0740 03/13/13 1158  GLUCAP 101* 88 123*    Scheduled Meds: . atorvastatin  20 mg Oral QHS  . calcium carbonate  1 tablet Oral BID WC  . feeding supplement  237 mL Oral BID BM  . ferrous fumarate-b12-vitamic C-folic acid  1 capsule Oral Daily  . furosemide  40 mg Oral BID  . gabapentin  100 mg Oral TID  . hydrALAZINE  25 mg Oral TID  . insulin aspart  0-15 Units Subcutaneous TID WC  .  insulin aspart  0-5 Units Subcutaneous QHS  . insulin glargine  20 Units Subcutaneous BID  . isosorbide dinitrate  20 mg Oral TID  . levothyroxine  50 mcg Oral BH-q7a  . pantoprazole  40 mg Oral Daily  . piperacillin-tazobactam (ZOSYN)  IV  3.375 g Intravenous Q8H  . polyethylene glycol  17 g Oral Daily  . potassium chloride  20 mEq Oral BID  . senna-docusate  2 tablet Oral BID  . sodium chloride  3 mL Intravenous Q12H  . sodium chloride  3 mL Intravenous Q12H  . tamsulosin  0.4 mg Oral QHS  . vancomycin  1,000 mg Intravenous Q24H    Continuous Infusions:   Past Medical History  Diagnosis Date  . HTN (hypertension)   . PVD (peripheral vascular disease)   . COPD (chronic obstructive pulmonary disease)   . Diabetes mellitus     IDDM  . S/P CABG x 6 2003    Dr Cornelius Moras,  Myoview low risk 2009  . Gout   . Paroxysmal atrial fibrillation   . Hypothyroidism   . ICD (implantable cardiac defibrillator) discharge 5/11    MDT  . Chronic renal insufficiency, stage III (moderate)   . Chronic systolic CHF (congestive heart failure)     EF 25-30% echo 12/12  . S/P angioplasty, 11/27/12 -Lt below the knee popliteal artery 11/28/2012    Past Surgical History  Procedure Laterality Date  . Coronary artery bypass graft    . Cardiac catheterization    . Amputation  12/08/2012    Procedure: AMPUTATION BELOW KNEE;  Surgeon: Eldred Manges, MD;  Location: [facility] OR;  Service: Orthopedics;  Laterality: Left;    Ian Malkin RD, LDN Inpatient Clinical Dietitian Pager: [phone] After Hours Pager: [phone]

## 2013-03-14 ENCOUNTER — Inpatient Hospital Stay (HOSPITAL_COMMUNITY): Payer: MEDICARE | Admitting: Anesthesiology

## 2013-03-14 ENCOUNTER — Encounter (HOSPITAL_COMMUNITY): Payer: Self-pay | Admitting: Anesthesiology

## 2013-03-14 ENCOUNTER — Encounter (HOSPITAL_COMMUNITY)
Admission: EM | Disposition: A | Discharge status: Skilled Nursing Facility | Payer: Self-pay | Source: Home / Self Care | Attending: Internal Medicine

## 2013-03-14 HISTORY — PX: INCISION AND DRAINAGE: SHX5863

## 2013-03-14 HISTORY — PX: APPLICATION OF WOUND VAC: SHX5189

## 2013-03-14 LAB — BASIC METABOLIC PANEL
CO2: 28 mEq/L (ref 19–32)
Chloride: 102 mEq/L (ref 96–112)
Creatinine, Ser: 1.96 mg/dL — ABNORMAL HIGH (ref 0.50–1.35)
GFR calc Af Amer: 37 mL/min — ABNORMAL LOW (ref >90)
Sodium: 141 mEq/L (ref 135–145)

## 2013-03-14 LAB — SURGICAL PCR SCREEN: MRSA, PCR: NEGATIVE

## 2013-03-14 LAB — GLUCOSE, CAPILLARY
Glucose-Capillary: 151 mg/dL — ABNORMAL HIGH (ref 70–99)
Glucose-Capillary: 152 mg/dL — ABNORMAL HIGH (ref 70–99)
Glucose-Capillary: 266 mg/dL — ABNORMAL HIGH (ref 70–99)

## 2013-03-14 LAB — CBC
Platelets: 276 10*3/uL (ref 150–400)
RBC: 3.29 MIL/uL — ABNORMAL LOW (ref 4.22–5.81)
RDW: 17.3 % — ABNORMAL HIGH (ref 11.5–15.5)
WBC: 16 10*3/uL — ABNORMAL HIGH (ref 4.0–10.5)

## 2013-03-14 SURGERY — INCISION AND DRAINAGE
Anesthesia: General | Site: Foot | Laterality: Right | Wound class: Contaminated

## 2013-03-14 MED ORDER — FENTANYL CITRATE 0.05 MG/ML IJ SOLN
INTRAMUSCULAR | Status: DC | PRN
  Administered 2013-03-14: 25 ug via INTRAVENOUS
  Administered 2013-03-14: 50 ug via INTRAVENOUS
  Administered 2013-03-14 (×5): 25 ug via INTRAVENOUS

## 2013-03-14 MED ORDER — PROPOFOL 10 MG/ML IV BOLUS
INTRAVENOUS | Status: DC | PRN
  Administered 2013-03-14: 120 mg via INTRAVENOUS

## 2013-03-14 MED ORDER — SUCCINYLCHOLINE CHLORIDE 20 MG/ML IJ SOLN
INTRAMUSCULAR | Status: DC | PRN
  Administered 2013-03-14: 40 mg via INTRAVENOUS

## 2013-03-14 MED ORDER — HYDROMORPHONE HCL PF 1 MG/ML IJ SOLN
INTRAMUSCULAR | Status: AC
  Filled 2013-03-14: qty 1

## 2013-03-14 MED ORDER — ONDANSETRON HCL 4 MG/2ML IJ SOLN
INTRAMUSCULAR | Status: DC | PRN
  Administered 2013-03-14: 4 mg via INTRAVENOUS

## 2013-03-14 MED ORDER — FENTANYL CITRATE 0.05 MG/ML IJ SOLN
25.0000 ug | INTRAMUSCULAR | Status: DC | PRN
  Administered 2013-03-14 (×2): 50 ug via INTRAVENOUS

## 2013-03-14 MED ORDER — LACTATED RINGERS IV SOLN: INTRAVENOUS | Status: DC

## 2013-03-14 MED ORDER — LACTATED RINGERS IR SOLN
Status: DC | PRN
  Administered 2013-03-14: 3000 mL

## 2013-03-14 MED ORDER — LACTATED RINGERS IV SOLN
INTRAVENOUS | Status: DC | PRN
  Administered 2013-03-14: 08:00:00 via INTRAVENOUS

## 2013-03-14 MED ORDER — 0.9 % SODIUM CHLORIDE (POUR BTL) OPTIME
TOPICAL | Status: DC | PRN
  Administered 2013-03-14: 1000 mL

## 2013-03-14 MED ORDER — LIDOCAINE HCL (PF) 2 % IJ SOLN
INTRAMUSCULAR | Status: DC | PRN
  Administered 2013-03-14: 75 mg

## 2013-03-14 SURGICAL SUPPLY — 37 items
BAG SPEC THK2 15X12 ZIP CLS (MISCELLANEOUS)
BAG ZIPLOCK 12X15 (MISCELLANEOUS) ×2 IMPLANT
BANDAGE ESMARK 6X9 LF (GAUZE/BANDAGES/DRESSINGS) ×2 IMPLANT
BANDAGE GAUZE ELAST BULKY 4 IN (GAUZE/BANDAGES/DRESSINGS) ×1 IMPLANT
BNDG CMPR 9X6 STRL LF SNTH (GAUZE/BANDAGES/DRESSINGS)
BNDG ESMARK 6X9 LF (GAUZE/BANDAGES/DRESSINGS)
CHLORAPREP W/TINT 26ML (MISCELLANEOUS) ×1 IMPLANT
CLOTH BEACON ORANGE TIMEOUT ST (SAFETY) ×3 IMPLANT
CUFF TOURN SGL QUICK 18 (TOURNIQUET CUFF) IMPLANT
CUFF TOURN SGL QUICK 24 (TOURNIQUET CUFF)
CUFF TOURN SGL QUICK 34 (TOURNIQUET CUFF)
CUFF TRNQT CYL 24X4X40X1 (TOURNIQUET CUFF) IMPLANT
CUFF TRNQT CYL 34X4X40X1 (TOURNIQUET CUFF) IMPLANT
DRAIN PENROSE 18X1/2 LTX STRL (DRAIN) ×2 IMPLANT
DRSG EMULSION OIL 3X16 NADH (GAUZE/BANDAGES/DRESSINGS) ×6 IMPLANT
DRSG PAD ABDOMINAL 8X10 ST (GAUZE/BANDAGES/DRESSINGS) ×4 IMPLANT
DRSG VAC ATS SM SENSATRAC (GAUZE/BANDAGES/DRESSINGS) ×1 IMPLANT
DURAPREP 26ML APPLICATOR (WOUND CARE) ×2 IMPLANT
ELECT REM PT RETURN 9FT ADLT (ELECTROSURGICAL) ×3
ELECTRODE REM PT RTRN 9FT ADLT (ELECTROSURGICAL) ×2 IMPLANT
GLOVE BIO SURGEON STRL SZ7.5 (GLOVE) ×2 IMPLANT
GLOVE BIOGEL PI IND STRL 8 (GLOVE) ×2 IMPLANT
GLOVE BIOGEL PI INDICATOR 8 (GLOVE) ×1
GLOVE ECLIPSE 8.0 STRL XLNG CF (GLOVE) ×3 IMPLANT
GOWN STRL REIN XL XLG (GOWN DISPOSABLE) ×3 IMPLANT
HANDPIECE INTERPULSE COAX TIP (DISPOSABLE)
KIT BASIN OR (CUSTOM PROCEDURE TRAY) ×3 IMPLANT
PACK LOWER EXTREMITY WL (CUSTOM PROCEDURE TRAY) ×3 IMPLANT
PAD CAST 4YDX4 CTTN HI CHSV (CAST SUPPLIES) ×2 IMPLANT
PADDING CAST COTTON 4X4 STRL (CAST SUPPLIES)
POSITIONER SURGICAL ARM (MISCELLANEOUS) ×2 IMPLANT
SET HNDPC FAN SPRY TIP SCT (DISPOSABLE) ×2 IMPLANT
SPONGE GAUZE 4X4 12PLY (GAUZE/BANDAGES/DRESSINGS) ×2 IMPLANT
SUT ETHILON 2 0 PS N (SUTURE) ×2 IMPLANT
SUT ETHILON 2 0 PSLX (SUTURE) ×1 IMPLANT
SYR CONTROL 10ML LL (SYRINGE) ×2 IMPLANT
TOWEL OR 17X26 10 PK STRL BLUE (TOWEL DISPOSABLE) ×6 IMPLANT

## 2013-03-14 NOTE — Anesthesia Postprocedure Evaluation (Signed)
  Anesthesia Post-op Note  Patient: [name]  Procedure(s) Performed: Procedure(s) with comments: INCISION AND DRAINAGE (Right) - wash out and wound vac placement APPLICATION OF WOUND VAC (Right) - heel  Patient is awake and responsive. Pain and nausea are reasonably well controlled. Vital signs are stable and clinically acceptable. Oxygen saturation is clinically acceptable. There are no apparent anesthetic complications at this time. Patient is ready for discharge.

## 2013-03-14 NOTE — Progress Notes (Signed)
Intubation showed this patient's throat to be abundantly covered with thrush,  needing RX. Thanks.

## 2013-03-14 NOTE — Progress Notes (Signed)
Called NP re Lantus dose due to pt NPO for OR.  Lantus held yesterday so todays CBGS were on no Lantus.  Order obtained to hold Lantus for tonight.  Barnett Hatter P

## 2013-03-14 NOTE — Op Note (Signed)
NAMEEVERTTE, SONES               ACCOUNT NO.:  [id number]  MEDICAL RECORD NO.:  [id number]  LOCATION:  1402                         FACILITY:  [facility]  PHYSICIAN:  Vanita Panda. Magnus Ivan, M.D.DATE OF BIRTH:  19-Nov-1935  DATE OF PROCEDURE:  03/14/2013 DATE OF DISCHARGE:                              OPERATIVE REPORT   PREOPERATIVE DIAGNOSIS:  Right foot necrotic heel ulcer with posterior ankle infection and ruptured Achilles tendon.  POSTOPERATIVE DIAGNOSIS:  Right foot necrotic heel ulcer with posterior ankle infection and ruptured Achilles tendon.  FINDINGS:  Completely ruptured Achilles tendon, right foot with necrotic bone around the posterior calcaneus and gross purulence.  PROCEDURES: 1. Irrigation and debridement of right foot including excisional sharp     debridement of skin, fascia, muscle, soft tissue, and bone. 2. Placement of VAC sponge over right heel wound.  SURGEON:  Vanita Panda. Magnus Ivan, M.D.  ASSISTANT:  Richardean Canal, P.A.  ANESTHESIA:  General.  BLOOD LOSS:  Less than 100 mL.  COMPLICATIONS:  None.  INDICATIONS:  Mr. Masden is a 78 year old gentleman with severe peripheral vascular disease, diabetes and heart disease.  He has a left below-knee amputation.  He developed a right heel ulcer from a pressure ulcer at some point, and eventually it opened up.  He then last week likely ruptured his Achilles tendon.  This was treated with wet-to-dry dressing through this open wound, exposed to have soaks.  At some point, he was admitted to the hospital again due to increasing white blood cell count, cellulitis and pain.  On exam, he has a necrotic wound over his calcaneus with gross purulence and obvious rupture of the Achilles tendon and necrotic skin as well.  At some point, he may needed amputation; however, he has had a reperfusion to that leg and I think there is a chance that we could salvage the soft tissue, but he understand this is well __________,  consent to an amputation, but will consent to an irrigation and debridement.  PROCEDURE DESCRIPTION:  After informed consent was obtained, appropriate right foot and ankle were marked.  He was brought to the operating room and placed supine on the operating table, general anesthesia was obtained.  He was then turned into a lateral decubitus position with the right hip and leg up and padding beneath and down on operative left leg and amputation stump and build up with pillows underneath his heel.  His left foot was prepped and draped with ChloraPrep and sterile drapes. Time-out was called and he was identified as correct patient and correct right foot.  We then assessed the wound and found gross purulence.  I used a #10 blade and excised sharply soft tissue in the end of the Achilles stump.  We brought the incision up proximal because the bag was angle too, completely released the gastrocs fascia and to assess the muscle on this area, which we did not find significant necrosis.  I then used a rongeur and osteotome to perform removal of bone and a partial exostectomy of the posterior calcaneus.  I did get good bleeding tissue and good bleeding bone that appeared healthy.  We removed all necrotic skin, soft tissue, muscle, and bone  sharply.  We then used 3 liters of pulsatile lavage solution with normal saline to lavage the wound and got it nice and clean.  I then reapproximated the incision with interrupted 2-0 nylon all the way down to the heel wound measuring about 2.5 x 3 cm. There was good bleeding calcaneus bone around this.  So, we placed a VAC sponge onto the heel wound and then put Adaptic over the incision with an incisional VAC attached to this as well.  We then placed well-padded sterile dressing.  He was rolled back to supine position, awakened, extubated and taken to recovery room in stable condition.  All final counts were correct.  There were no complications noted.  Of  note, Richardean Canal, PA-C was present during the entire case and his presence was crucial for exposure and cleaning the wound with placement of VAC sponges well.  Also of note, we will leave the VAC sponge for several days, make sure that his white blood cell count continues to trend in appropriate direction with potential repeat I and D later.     Vanita Panda. Magnus Ivan, M.D.     CYB/MEDQ  D:  03/14/2013  T:  03/14/2013  Job:  [id number]

## 2013-03-14 NOTE — Anesthesia Preprocedure Evaluation (Addendum)
Anesthesia Evaluation  Patient identified by MRN, date of birth, ID band Patient awake    Reviewed: Allergy & Precautions, H&P , NPO status , Patient's Chart, lab work & pertinent test results, reviewed documented beta blocker date and time   History of Anesthesia Complications Negative for: history of anesthetic complications  Airway Mallampati: II TM Distance: >3 FB Neck ROM: Full    Dental  (+) Edentulous Upper and Partial Lower   Pulmonary sleep apnea and Oxygen sleep apnea , COPDformer smoker,  breath sounds clear to auscultation  Pulmonary exam normal       Cardiovascular hypertension, Pt. on medications and Pt. on home beta blockers + CAD, + CABG and + Peripheral Vascular Disease + dysrhythmias (coumadin, INR 1.31) Atrial Fibrillation + Cardiac Defibrillator Rhythm:Irregular Rate:Normal  12/12 ECHO: EF 25-30%, mild MR   Neuro/Psych negative neurological ROS     GI/Hepatic GERD-  Controlled,  Endo/Other  diabetes (glu 99), Well Controlled, Type 2, Insulin Dependent and Oral Hypoglycemic AgentsHypothyroidism (on replacement)   Renal/GU Renal InsufficiencyRenal disease (creat 1.92, K+ 3.9)     Musculoskeletal   Abdominal   Peds  Hematology  (+) Blood dyscrasia, anemia , INR was >3   Anesthesia Other Findings Pt states NPO INR better BP controlled K+ improved. BS higher at 169...Marland KitchenMarland KitchenMarland Kitchenno Rx needed now Old chart reviewed; Plan LMA as from 1/14 BKA Discussed with Daine Gip CRNA previous induction dose and Need for some Neosyn  Reproductive/Obstetrics                        Anesthesia Physical  Anesthesia Plan  ASA: III  Anesthesia Plan: General   Post-op Pain Management:    Induction: Intravenous  Airway Management Planned: LMA  Additional Equipment:   Intra-op Plan:   Post-operative Plan:   Informed Consent: I have reviewed the patients History and Physical, chart, labs and  discussed the procedure including the risks, benefits and alternatives for the proposed anesthesia with the patient or authorized representative who has indicated his/her understanding and acceptance.   Dental advisory given  Plan Discussed with: CRNA and Surgeon  Anesthesia Plan Comments: (Plan routine monitors, GA- LMA OK)        Anesthesia Quick Evaluation

## 2013-03-14 NOTE — Progress Notes (Signed)
Pt converted to A fib at 1300. MD notified. Will continue to monitor. Damiel Barthold, Lavone Orn, RN

## 2013-03-14 NOTE — Progress Notes (Signed)
[facility] PROGRESS NOTE  [name] WUJ:[id number] DOB: [birth year] DOA: 03/12/2013 PCP: Colette Ribas, MD  Brief narrative: [name] is an 78 y.o. male with a past medical history of coronary artery disease, ischemic cardiomyopathy with an EF of 20% status post AICD placement, diabetes, hypertension, peripheral vascular disease status post left BKA a few months ago who was sent to rehabilitation and who presented to the hospital on 03/12/2013 with a nonhealing diabetic wound of his right heel with overlying cellulitis. Outpatient angiogram reportedly without significant stenosis. Underwent I&D and wash out of the wound with placement of a wound VAC on 03/14/2013.  Assessment/Plan: Principal Problem:   Cellulitis of right leg -Admitted and put on empiric broad-spectrum antibiotics including vancomycin and Zosyn. -Right lower extremity Doppler negative for DVT. Plain films of the foot show no change in chronic erosions. -Wound care nurse consultation pending.   -Status post evaluation by Dr. Magnus Ivan who performed an incision and drainage with washout of the wound and placement of a wound VAC on 03/14/2013. -Followup on blood cultures Active Problems:   Hypothyroidism -Continue Synthroid.   Hypokalemia -Replaced.   Hx of CABG x 6 2003 -Continue statin   ICD in place, MDT May 2011 -Frequent runs of nonsustained V. tach on telemetry noted. No discharge of AICD noted.   PAF (paroxysmal atrial fibrillation) -Maintained on chronic Coumadin therapy.  Taken off per daughter's recollection. -Will need to touch base with Dr. Loney Hering on 03/16/13.  Records requested. -INR supratherapeutic on admission.   Diabetes mellitus type 2, insulin dependent -Continue Lantus insulin and sliding scale. CBGs 123-192. -Hemoglobin A1c 8%.   CKD (chronic kidney disease) stage 4, GFR 15-29 ml/min -Creatinine at usual baseline values. Continue to monitor.   Anemia due to chronic  illness -Hemoglobin stable. No indication for transfusion. -Continue iron supplement.   Ischemic cardiomyopathy, EF 25% 2D 12/12 -Monitor intake and outputs closely. Keep euvolemic. -Continue Lasix, hydralazine, Isordil.  Code Status: Full code  Family Communication: Discussed with the patient and his daughter and wife at the bedside. Disposition Plan: Will likely need to go back to SNF.     Medical Consultants: Orthopedic Surgery  Other Consultants:  Wound care RN  Anti-infectives:  Vancomycin 03/12/2013--->  Zosyn 03/12/2013--->  HPI/Subjective: [name] is a bit sedated postoperatively. His wife and daughter are at the bedside. They expressed some concerns about his desire to return home and understand that they would need to provide 24-hour care, which they state is currently not possible. No nausea or vomiting.  Objective: Filed Vitals:   03/14/13 1135 03/14/13 1140 03/14/13 1145 03/14/13 1151  BP:    147/64  Pulse: 91 96 94 85  Temp:    98.9 F (37.2 C)  TempSrc:      Resp: 17 15 17 12   Height:      Weight:      SpO2: 100% 100% 100%     Intake/Output Summary (Last 24 hours) at 03/14/13 1402 Last data filed at 03/14/13 1146  Gross per 24 hour  Intake   1040 ml  Output   1050 ml  Net    -10 ml    Exam: Gen:  NAD Cardiovascular:  Tachy with irregularity, No M/R/G Respiratory:  Lungs CTAB Gastrointestinal:  Abdomen soft, NT/ND, + BS Extremities:  Left BKA, right heel dressed in an ACE wrap with VAC in place.  Data Reviewed: Basic Metabolic Panel:  Recent Labs Lab 03/12/13 1130 03/13/13 0435 03/14/13 0456  NA 138 139 141  K 2.9* 3.1* 3.7  CL 98 101 102  CO2 26 27 28   GLUCOSE 173* 91 169*  BUN 50* 46* 50*  CREATININE 2.05* 1.92* 1.96*  CALCIUM 9.0 8.9 9.0  MG  --  2.2  --    GFR Estimated Creatinine Clearance: 37.6 ml/min (by C-G formula based on Cr of 1.96). Liver Function Tests:  Recent Labs Lab 03/13/13 0435  AST 18  ALT 5   ALKPHOS 100  BILITOT 1.0  PROT 6.3  ALBUMIN 2.0*   Coagulation profile  Recent Labs Lab 14-Mar-2013 1336 03/13/13 0435  INR 3.19* 2.24*    CBC:  Recent Labs Lab 03-14-13 1130 03/13/13 0435 03/14/13 0456  WBC 13.7* 14.8* 16.0*  NEUTROABS 11.7*  --   --   HGB 9.5* 8.6* 8.4*  HCT 29.6* 27.1* 26.1*  MCV 78.7 78.6 79.3  PLT 240 247 276   CBG:  Recent Labs Lab 03/13/13 1158 03/13/13 1647 03/13/13 2137 03/14/13 0809 03/14/13 1130  GLUCAP 123* 192* 137* 151* 157*   Hgb A1c  Recent Labs  03-14-13 1714  HGBA1C 8.0*   Microbiology Recent Results (from the past 240 hour(s))  SURGICAL PCR SCREEN     Status: Abnormal   Collection Time    03/14/13  7:32 AM      Result Value Range Status   MRSA, PCR NEGATIVE  NEGATIVE Final   Staphylococcus aureus POSITIVE (*) NEGATIVE Final   Comment:            The Xpert SA Assay (FDA     approved for NASAL specimens     in patients over 47 years of age),     is one component of     a comprehensive surveillance     program.  Test performance has     been validated by [employer] for patients greater     than or equal to 69 year old.     It is not intended     to diagnose infection nor to     guide or monitor treatment.     Procedures and Diagnostic Studies: Dg Foot Complete Right  03/14/13  *RADIOLOGY REPORT*  Clinical Data: Swollen right foot.  RIGHT FOOT COMPLETE - 3+ VIEW  Comparison: 07/19/2012  Findings: Three views of the right foot were obtained.  Again noted are large erosions involving the distal aspect of the first metatarsal bone.  Again noted are erosions of the second toe DIP joint.  Alignment of the right foot has not changed.  There is no evidence for an acute fracture or dislocation.  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   Original Report Authenticated By: Richarda Overlie, M.D.     Scheduled Meds: . atorvastatin  20 mg Oral QHS  . calcium carbonate  1  tablet Oral BID WC  . feeding supplement  237 mL Oral BID BM  . ferrous fumarate-b12-vitamic C-folic acid  1 capsule Oral Daily  . furosemide  40 mg Oral BID  . gabapentin  100 mg Oral TID  . hydrALAZINE  25 mg Oral TID  . HYDROmorphone      . insulin aspart  0-15 Units Subcutaneous TID WC  . insulin aspart  0-5 Units Subcutaneous QHS  . insulin glargine  20 Units Subcutaneous BID  . isosorbide dinitrate  20 mg Oral TID  . levothyroxine  50 mcg Oral BH-q7a  . multivitamin  with minerals  1 tablet Oral Daily  . nutrition supplement  1 packet Oral BID BM  . pantoprazole  40 mg Oral Daily  . piperacillin-tazobactam (ZOSYN)  IV  3.375 g Intravenous Q8H  . polyethylene glycol  17 g Oral Daily  . senna-docusate  2 tablet Oral BID  . sodium chloride  3 mL Intravenous Q12H  . sodium chloride  3 mL Intravenous Q12H  . tamsulosin  0.4 mg Oral QHS  . vancomycin  1,000 mg Intravenous Q24H   Continuous Infusions:   Time spent: 35 minutes.   LOS: 2 days   RAMA,CHRISTINA  [facility] Pager [phone].  If 8PM-8AM, please contact night-coverage at [email or web address], password [facility] 03/14/2013, 2:02 PM

## 2013-03-14 NOTE — Transfer of Care (Signed)
Immediate Anesthesia Transfer of Care Note  Patient: [name]  Procedure(s) Performed: Procedure(s) with comments: INCISION AND DRAINAGE (Right) - wash out and wound vac placement  Patient Location: PACU  Anesthesia Type:General  Level of Consciousness: awake and patient cooperative  Airway & Oxygen Therapy: Patient Spontanous Breathing and Patient connected to face mask oxygen  Post-op Assessment: Report given to PACU RN and Post -op Vital signs reviewed and stable  Post vital signs: Reviewed and stable  Complications: No apparent anesthesia complications

## 2013-03-14 NOTE — Brief Op Note (Signed)
03/12/2013 - 03/14/2013  10:55 AM  PATIENT:  [name]  78 y.o. male  PRE-OPERATIVE DIAGNOSIS:  right heel wound and infection  POST-OPERATIVE DIAGNOSIS:  right heel wound and infection  PROCEDURE:  Procedure(s) with comments: INCISION AND DRAINAGE (Right) - wash out and wound vac placement  SURGEON:  Surgeon(s) and Role:    * Kathryne Hitch, MD - Primary    * Kathryne Hitch, MD  PHYSICIAN ASSISTANT:   Rexene Edison, PA-C   ANESTHESIA:   general  EBL:  Total I/O In: 500 [I.V.:500] Out: -   BLOOD ADMINISTERED:none  DRAINS: VAC sponge set to -125 mhg pressure   LOCAL MEDICATIONS USED:  NONE  SPECIMEN:  No Specimen  DISPOSITION OF SPECIMEN:  N/A  COUNTS:  YES  TOURNIQUET:  * No tourniquets in log *  DICTATION: .Other Dictation: Dictation Number [id number]  PLAN OF CARE: Admit to inpatient   PATIENT DISPOSITION:  PACU - hemodynamically stable.   Delay start of Pharmacological VTE agent (>24hrs) due to surgical blood loss or risk of bleeding: no

## 2013-03-15 LAB — CBC
MCH: 25.1 pg — ABNORMAL LOW (ref 26.0–34.0)
MCHC: 31.3 g/dL (ref 30.0–36.0)
MCV: 80.1 fL (ref 78.0–100.0)
Platelets: 273 10*3/uL (ref 150–400)

## 2013-03-15 LAB — BASIC METABOLIC PANEL
Calcium: 9 mg/dL (ref 8.4–10.5)
Creatinine, Ser: 1.97 mg/dL — ABNORMAL HIGH (ref 0.50–1.35)
GFR calc non Af Amer: 31 mL/min — ABNORMAL LOW (ref >90)
Glucose, Bld: 261 mg/dL — ABNORMAL HIGH (ref 70–99)
Sodium: 139 mEq/L (ref 135–145)

## 2013-03-15 LAB — PROTIME-INR
INR: 1.56 — ABNORMAL HIGH (ref 0.00–1.49)
Prothrombin Time: 18.2 seconds — ABNORMAL HIGH (ref 11.6–15.2)

## 2013-03-15 LAB — GLUCOSE, CAPILLARY: Glucose-Capillary: 234 mg/dL — ABNORMAL HIGH (ref 70–99)

## 2013-03-15 MED ORDER — INSULIN GLARGINE 100 UNIT/ML ~~LOC~~ SOLN
22.0000 [IU] | Freq: Two times a day (BID) | SUBCUTANEOUS | Status: DC
  Administered 2013-03-15 (×2): 22 [IU] via SUBCUTANEOUS
  Filled 2013-03-15 (×3): qty 0.22

## 2013-03-15 NOTE — Progress Notes (Signed)
[facility] PROGRESS NOTE  [name] ZOX:[id number] DOB: 11-22-1935 DOA: 03/12/2013 PCP: Colette Ribas, MD  Brief narrative: [name] is an 78 y.o. male with a past medical history of coronary artery disease, ischemic cardiomyopathy with an EF of 20% status post AICD placement, diabetes, hypertension, peripheral vascular disease status post left BKA a few months ago who was sent to rehabilitation and who presented to the hospital on 03/12/2013 with a nonhealing diabetic wound of his right heel with overlying cellulitis. Outpatient angiogram reportedly without significant stenosis. Underwent I&D and wash out of the wound with placement of a wound VAC on 03/14/2013.  Assessment/Plan: Principal Problem:   Cellulitis of right leg -Admitted and put on empiric broad-spectrum antibiotics including vancomycin and Zosyn. -Right lower extremity Doppler negative for DVT. Plain films of the foot show no change in chronic erosions. -Status post evaluation by Dr. Magnus Ivan who performed an incision and drainage with washout of the wound and placement of a wound VAC on 03/14/2013. Will need repeat I&D of his left foot and possible synthetic graft to heal. -Followup on blood cultures Active Problems:   Hypothyroidism -Continue Synthroid.   Hypokalemia -Replaced.   Hx of CABG x 6 2003 -Continue statin   ICD in place, MDT May 2011 -Frequent runs of nonsustained V. tach on telemetry noted. No discharge of AICD noted.   PAF (paroxysmal atrial fibrillation) -Maintained on chronic Coumadin therapy.  Taken off per daughter's recollection. -Will need to touch base with Dr. Loney Hering on 03/16/13.  Records requested. -INR supratherapeutic on admission.   Diabetes mellitus type 2, insulin dependent -Increase Lantus to 22 units and continue moderate sensitive sliding scale. CBGs 151-266. -Hemoglobin A1c 8%.   CKD (chronic kidney disease) stage 4, GFR 15-29 ml/min -Creatinine at usual baseline  values. Continue to monitor.   Anemia due to chronic illness -Hemoglobin stable. No indication for transfusion. -Continue iron supplement.   Ischemic cardiomyopathy, EF 25% 2D 12/12 -Monitor intake and outputs closely. Keep euvolemic. -Continue Lasix, hydralazine, Isordil.  Code Status: Full code  Family Communication: Discussed with the patient and his daughter and wife at the bedside. Disposition Plan: Will likely need to go back to SNF.     Medical Consultants: Orthopedic Surgery  Other Consultants:  Wound care RN  Anti-infectives:  Vancomycin 03/12/2013--->  Zosyn 03/12/2013--->  HPI/Subjective: [name] is without significant complaints.  He is drinking Glucerna, and says his appetite and pain are "about the same".  Difficult to engage in conversation.  No N/V.  Objective: Filed Vitals:   03/14/13 1151 03/14/13 1402 03/14/13 2222 03/15/13 0638  BP: 147/64 110/61 110/54 116/49  Pulse: 85 89 98 63  Temp: 98.9 F (37.2 C) 98.1 F (36.7 C) 100 F (37.8 C) 97.5 F (36.4 C)  TempSrc:  Oral Oral Oral  Resp: 12 12 18 18   Height:      Weight:      SpO2:  100% 100% 100%    Intake/Output Summary (Last 24 hours) at 03/15/13 8119 Last data filed at 03/15/13 0300  Gross per 24 hour  Intake   1210 ml  Output    750 ml  Net    460 ml    Exam: Gen:  NAD Cardiovascular:  Occasional ectopy, No M/R/G Respiratory:  Lungs CTAB Gastrointestinal:  Abdomen soft, NT/ND, + BS Extremities:  Left BKA, right heel dressed in an ACE wrap with VAC in place.  Data Reviewed: Basic Metabolic Panel:  Recent Labs Lab 03/12/13 1130  03/13/13 0435 03/14/13 0456 03/15/13 0502  NA 138 139 141 139  K 2.9* 3.1* 3.7 3.9  CL 98 101 102 100  CO2 26 27 28 28   GLUCOSE 173* 91 169* 261*  BUN 50* 46* 50* 55*  CREATININE 2.05* 1.92* 1.96* 1.97*  CALCIUM 9.0 8.9 9.0 9.0  MG  --  2.2  --   --    GFR Estimated Creatinine Clearance: 37.4 ml/min (by C-G formula based on Cr of  1.97). Liver Function Tests:  Recent Labs Lab 03/13/13 0435  AST 18  ALT 5  ALKPHOS 100  BILITOT 1.0  PROT 6.3  ALBUMIN 2.0*   Coagulation profile  Recent Labs Lab 04-01-13 1336 03/13/13 0435 03/15/13 0502  INR 3.19* 2.24* 1.56*    CBC:  Recent Labs Lab 01-Apr-2013 1130 03/13/13 0435 03/14/13 0456 03/15/13 0502  WBC 13.7* 14.8* 16.0* 18.6*  NEUTROABS 11.7*  --   --   --   HGB 9.5* 8.6* 8.4* 8.2*  HCT 29.6* 27.1* 26.1* 26.2*  MCV 78.7 78.6 79.3 80.1  PLT 240 247 276 273   CBG:  Recent Labs Lab 03/14/13 0809 03/14/13 1130 03/14/13 1643 03/14/13 2217 03/15/13 0826  GLUCAP 151* 157* 266* 191* 230*   Hgb A1c  Recent Labs  04/01/2013 1714  HGBA1C 8.0*   Microbiology Recent Results (from the past 240 hour(s))  SURGICAL PCR SCREEN     Status: Abnormal   Collection Time    03/14/13  7:32 AM      Result Value Range Status   MRSA, PCR NEGATIVE  NEGATIVE Final   Staphylococcus aureus POSITIVE (*) NEGATIVE Final   Comment:            The Xpert SA Assay (FDA     approved for NASAL specimens     in patients over 57 years of age),     is one component of     a comprehensive surveillance     program.  Test performance has     been validated by [employer] for patients greater     than or equal to 64 year old.     It is not intended     to diagnose infection nor to     guide or monitor treatment.     Procedures and Diagnostic Studies: Dg Foot Complete Right  04-01-2013  *RADIOLOGY REPORT*  Clinical Data: Swollen right foot.  RIGHT FOOT COMPLETE - 3+ VIEW  Comparison: 07/19/2012  Findings: Three views of the right foot were obtained.  Again noted are large erosions involving the distal aspect of the first metatarsal bone.  Again noted are erosions of the second toe DIP joint.  Alignment of the right foot has not changed.  There is no evidence for an acute fracture or dislocation.  IMPRESSION: Chronic erosive disease involving the first MTP joint and  second toe DIP joint.   Original Report Authenticated By: Richarda Overlie, M.D.     Scheduled Meds: . atorvastatin  20 mg Oral QHS  . calcium carbonate  1 tablet Oral BID WC  . feeding supplement  237 mL Oral BID BM  . ferrous fumarate-b12-vitamic C-folic acid  1 capsule Oral Daily  . furosemide  40 mg Oral BID  . gabapentin  100 mg Oral TID  . hydrALAZINE  25 mg Oral TID  . insulin aspart  0-15 Units Subcutaneous TID WC  . insulin aspart  0-5 Units Subcutaneous QHS  . insulin glargine  20 Units Subcutaneous BID  . isosorbide dinitrate  20 mg Oral TID  . levothyroxine  50 mcg Oral BH-q7a  . multivitamin with minerals  1 tablet Oral Daily  . nutrition supplement  1 packet Oral BID BM  . pantoprazole  40 mg Oral Daily  . piperacillin-tazobactam (ZOSYN)  IV  3.375 g Intravenous Q8H  . polyethylene glycol  17 g Oral Daily  . senna-docusate  2 tablet Oral BID  . sodium chloride  3 mL Intravenous Q12H  . sodium chloride  3 mL Intravenous Q12H  . tamsulosin  0.4 mg Oral QHS  . vancomycin  1,000 mg Intravenous Q24H   Continuous Infusions:   Time spent: 25 minutes.   LOS: 3 days   Liya Strollo  [facility] Pager [phone].  If 8PM-8AM, please contact night-coverage at [email or web address], password [facility] 03/15/2013, 9:27 AM

## 2013-03-15 NOTE — Progress Notes (Signed)
ANTIBIOTIC CONSULT NOTE - FOLLOW UP  Pharmacy Consult for Vancomycin and Zosyn Indication: Cellulitis  Allergies  Allergen Reactions  . Iodine Solution (Povidone Iodine) Rash    Topical only    Patient Measurements: Height: 6\' 2"  (188 cm) Weight: 180 lb (81.647 kg) IBW/kg (Calculated) : 82.2  Vital Signs: Temp: 97.5 F (36.4 C) (04/13 7829) Temp src: Oral (04/13 5621) BP: 116/49 mmHg (04/13 3086) Pulse Rate: 63 (04/13 0638) Intake/Output from previous day: 04/12 0701 - 04/13 0700 In: 1210 [P.O.:360; I.V.:550; IV Piggyback:300] Out: 750 [Urine:750] Intake/Output from this shift: Total I/O In: 120 [P.O.:120] Out: -   Labs:  Recent Labs  03/13/13 0435 03/14/13 0456 03/15/13 0502  WBC 14.8* 16.0* 18.6*  HGB 8.6* 8.4* 8.2*  PLT 247 276 273  CREATININE 1.92* 1.96* 1.97*   Estimated Creatinine Clearance: 37.4 ml/min (by C-G formula based on Cr of 1.97).  Recent Labs  03/15/13 1316  VANCOTROUGH 17.3     Microbiology: Recent Results (from the past 720 hour(s))  SURGICAL PCR SCREEN     Status: Abnormal   Collection Time    03/14/13  7:32 AM      Result Value Range Status   MRSA, PCR NEGATIVE  NEGATIVE Final   Staphylococcus aureus POSITIVE (*) NEGATIVE Final   Comment:            The Xpert SA Assay (FDA     approved for NASAL specimens     in patients over 23 years of age),     is one component of     a comprehensive surveillance     program.  Test performance has     been validated by [employer] for patients greater     than or equal to 56 year old.     It is not intended     to diagnose infection nor to     guide or monitor treatment.    Anti-infectives   Start     Dose/Rate Route Frequency Ordered Stop   03/13/13 1400  vancomycin (VANCOCIN) IVPB 1000 mg/200 mL premix     1,000 mg 200 mL/hr over 60 Minutes Intravenous Every 24 hours 03/12/13 1710     03/12/13 1800  piperacillin-tazobactam (ZOSYN) IVPB 3.375 g     3.375 g 12.5 mL/hr over  240 Minutes Intravenous Every 8 hours 03/12/13 1710     03/12/13 1330  vancomycin (VANCOCIN) IVPB 1000 mg/200 mL premix     1,000 mg 200 mL/hr over 60 Minutes Intravenous  Once 03/12/13 1312 03/12/13 1559      Assessment:  Day #4 Vanc/Zosyn for leg/foot cellulitis s/p I&D with washout 4/12 with VAC placement. Plan for possible synthetic graft to heel.  Vancomycin trough within goal  SCr elevated but stable with CKD-IV, CrCl ~33 ml/min  WBC continue to rise, Tm 100  Goal of Therapy:  Vancomycin trough level 15-20 mcg/ml  Plan:   Continue Vancomycin 1gm IV q24h  Continue Zosyn 3.375gm IV q8h (4hr extended infusions) Follow up renal function & cultures, duration of therapy  Loralee Pacas, PharmD, BCPS Pager: [phone] 03/15/2013,2:35 PM

## 2013-03-15 NOTE — Progress Notes (Signed)
Subjective: 1 Day Post-Op Procedure(s) (LRB): INCISION AND DRAINAGE (Right) APPLICATION OF WOUND VAC (Right) Patient reports pain as moderate.  Less painful than previous day.  Concerning that WBC is still trending up, however, we did debride his foot and ankle back to good viable bleeding tissue.  Objective: Vital signs in last 24 hours: Temp:  [97.5 F (36.4 C)-100 F (37.8 C)] 97.5 F (36.4 C) (04/13 1610) Pulse Rate:  [38-98] 63 (04/13 0638) Resp:  [12-21] 18 (04/13 9604) BP: (106-147)/(49-89) 116/49 mmHg (04/13 0638) SpO2:  [100 %] 100 % (04/13 5409)  Intake/Output from previous day: 04/12 0701 - 04/13 0700 In: 1210 [P.O.:360; I.V.:550; IV Piggyback:300] Out: 750 [Urine:750] Intake/Output this shift:     Recent Labs  03/12/13 1130 03/13/13 0435 03/14/13 0456 03/15/13 0502  HGB 9.5* 8.6* 8.4* 8.2*    Recent Labs  03/14/13 0456 03/15/13 0502  WBC 16.0* 18.6*  RBC 3.29* 3.27*  HCT 26.1* 26.2*  PLT 276 273    Recent Labs  03/14/13 0456 03/15/13 0502  NA 141 139  K 3.7 3.9  CL 102 100  CO2 28 28  BUN 50* 55*  CREATININE 1.96* 1.97*  GLUCOSE 169* 261*  CALCIUM 9.0 9.0    Recent Labs  03/13/13 0435 03/15/13 0502  INR 2.24* 1.56*    Incision: dressing C/D/I No cellulitis present Compartment soft VAC with good seal.  Assessment/Plan: 1 Day Post-Op Procedure(s) (LRB): INCISION AND DRAINAGE (Right) APPLICATION OF WOUND VAC (Right) Continue IV antibiotics Will need repeat I&D of his left foot ankle ankle likely Tuesday or Wed this week and possible synthetic graft to heel. Continue VAC for now. No weight on right foot for now.  Lalla Laham Y 03/15/2013, 9:06 AM

## 2013-03-16 ENCOUNTER — Encounter (HOSPITAL_COMMUNITY): Payer: Self-pay | Admitting: Orthopaedic Surgery

## 2013-03-16 LAB — GLUCOSE, CAPILLARY
Glucose-Capillary: 178 mg/dL — ABNORMAL HIGH (ref 70–99)
Glucose-Capillary: 119 mg/dL — ABNORMAL HIGH (ref 70–99)

## 2013-03-16 MED ORDER — INSULIN ASPART 100 UNIT/ML ~~LOC~~ SOLN
0.0000 [IU] | Freq: Three times a day (TID) | SUBCUTANEOUS | Status: DC
  Administered 2013-03-16: 3 [IU] via SUBCUTANEOUS
  Administered 2013-03-16: 4 [IU] via SUBCUTANEOUS
  Administered 2013-03-16 – 2013-03-18 (×2): 7 [IU] via SUBCUTANEOUS
  Administered 2013-03-18: 3 [IU] via SUBCUTANEOUS
  Administered 2013-03-18: 4 [IU] via SUBCUTANEOUS
  Administered 2013-03-19: 11 [IU] via SUBCUTANEOUS
  Administered 2013-03-19: 7 [IU] via SUBCUTANEOUS
  Administered 2013-03-19 – 2013-03-20 (×2): 4 [IU] via SUBCUTANEOUS
  Administered 2013-03-20: 3 [IU] via SUBCUTANEOUS
  Administered 2013-03-20: 4 [IU] via SUBCUTANEOUS

## 2013-03-16 MED ORDER — OXYCODONE HCL ER 15 MG PO T12A
15.0000 mg | EXTENDED_RELEASE_TABLET | Freq: Two times a day (BID) | ORAL | Status: DC
  Administered 2013-03-16 – 2013-03-20 (×8): 15 mg via ORAL
  Filled 2013-03-16 (×8): qty 1

## 2013-03-16 MED ORDER — INSULIN ASPART 100 UNIT/ML ~~LOC~~ SOLN
0.0000 [IU] | Freq: Every day | SUBCUTANEOUS | Status: DC
  Administered 2013-03-19: 4 [IU] via SUBCUTANEOUS

## 2013-03-16 MED ORDER — INSULIN GLARGINE 100 UNIT/ML ~~LOC~~ SOLN
25.0000 [IU] | Freq: Every day | SUBCUTANEOUS | Status: DC
  Administered 2013-03-16 – 2013-03-19 (×4): 25 [IU] via SUBCUTANEOUS
  Filled 2013-03-16 (×5): qty 0.25

## 2013-03-16 NOTE — Progress Notes (Addendum)
[facility] PROGRESS NOTE  [name] ZOX:[id number] DOB: 04-20-1935 DOA: 03/12/2013 PCP: Colette Ribas, MD  Brief narrative: [name] is an 78 y.o. male with a past medical history of coronary artery disease, ischemic cardiomyopathy with an EF of 20% status post AICD placement, diabetes, hypertension, peripheral vascular disease status post left BKA a few months ago who was sent to rehabilitation and who presented to the hospital on 03/12/2013 with a nonhealing diabetic wound of his right heel with overlying cellulitis. Outpatient angiogram reportedly without significant stenosis. Underwent I&D and wash out of the wound with placement of a wound VAC on 03/14/2013.  Assessment/Plan: Principal Problem:   Cellulitis of right leg -Admitted and put on empiric broad-spectrum antibiotics including vancomycin and Zosyn. -Right lower extremity Doppler negative for DVT. Plain films of the foot show no change in chronic erosions. -Status post evaluation by Dr. Magnus Ivan who performed an incision and drainage with washout of the wound and placement of a wound VAC on 03/14/2013. Will need repeat I&D of his left foot and possible synthetic graft to heal (likely back to OR on 03/17/13). -Followup on blood cultures. Active Problems:    Right knee pain -No frank erythema or effusion.  Very tender to touch.  Check uric acid level. -Continue dilaudid, oxy IR, add OxyContin for better pain relief.   Hypothyroidism -Continue Synthroid.   Hypokalemia -Replaced.   Hx of CABG x 6 2003 -Continue statin   ICD in place, MDT May 2011 -Frequent runs of nonsustained V. tach on telemetry noted. No discharge of AICD noted.   PAF (paroxysmal atrial fibrillation) -Maintained on chronic Coumadin therapy.  Taken off per daughter's recollection. -Will need to touch base with Dr. Royann Shivers on 03/16/13.  Records requested.  Spoke with office staff.  Dr. Royann Shivers not in office today.  They will pull chart and  get back to me. -INR supratherapeutic on admission.   Diabetes mellitus type 2, insulin dependent -Increase Lantus to 25 units and change to insulin resistant sliding scale. CBGs 191-246. -Hemoglobin A1c 8%.   CKD (chronic kidney disease) stage 4, GFR 15-29 ml/min -Creatinine at usual baseline values. Continue to monitor.   Anemia due to chronic illness -Hemoglobin stable. No indication for transfusion. -Continue iron supplement.   Ischemic cardiomyopathy, EF 25% 2D 12/12 -Monitor intake and outputs closely. Keep euvolemic. -Continue Lasix, hydralazine, Isordil.  Code Status: Full code  Family Communication: No family at the bedside. Disposition Plan: Will likely need to go back to SNF.     Medical Consultants:  Dr. Doneen Poisson, Orthopedic Surgery  Other Consultants:  Wound care RN  Anti-infectives:  Vancomycin 03/12/2013--->  Zosyn 03/12/2013--->  HPI/Subjective: [name] is having some right knee pain.  Had nausea and vomiting early this morning.  No diarrhea.  No dyspnea/cough.  Objective: Filed Vitals:   03/15/13 1539 03/15/13 2000 03/15/13 2155 03/16/13 0426  BP: 126/68  119/59 130/79  Pulse: 65 74 80 83  Temp: 98.5 F (36.9 C)  98.2 F (36.8 C) 98.4 F (36.9 C)  TempSrc: Oral  Oral Oral  Resp: 20  18 20   Height:      Weight:      SpO2: 100%  100% 100%    Intake/Output Summary (Last 24 hours) at 03/16/13 0746 Last data filed at 03/16/13 8119  Gross per 24 hour  Intake   1554 ml  Output   1000 ml  Net    554 ml    Exam: Gen:  NAD Cardiovascular:  Occasional ectopy, No M/R/G Respiratory:  Lungs CTAB Gastrointestinal:  Abdomen soft, NT/ND, + BS Extremities:  Left BKA, right heel dressed in an ACE wrap with VAC in place.  Data Reviewed: Basic Metabolic Panel:  Recent Labs Lab 03/18/13 1130 03/13/13 0435 03/14/13 0456 03/15/13 0502  NA 138 139 141 139  K 2.9* 3.1* 3.7 3.9  CL 98 101 102 100  CO2 26 27 28 28   GLUCOSE 173*  91 169* 261*  BUN 50* 46* 50* 55*  CREATININE 2.05* 1.92* 1.96* 1.97*  CALCIUM 9.0 8.9 9.0 9.0  MG  --  2.2  --   --    GFR Estimated Creatinine Clearance: 37.4 ml/min (by C-G formula based on Cr of 1.97). Liver Function Tests:  Recent Labs Lab 03/13/13 0435  AST 18  ALT 5  ALKPHOS 100  BILITOT 1.0  PROT 6.3  ALBUMIN 2.0*   Coagulation profile  Recent Labs Lab 18-Mar-2013 1336 03/13/13 0435 03/15/13 0502  INR 3.19* 2.24* 1.56*    CBC:  Recent Labs Lab 2013-03-18 1130 03/13/13 0435 03/14/13 0456 03/15/13 0502  WBC 13.7* 14.8* 16.0* 18.6*  NEUTROABS 11.7*  --   --   --   HGB 9.5* 8.6* 8.4* 8.2*  HCT 29.6* 27.1* 26.1* 26.2*  MCV 78.7 78.6 79.3 80.1  PLT 240 247 276 273   CBG:  Recent Labs Lab 03/14/13 2217 03/15/13 0826 03/15/13 1201 03/15/13 1642 03/15/13 2158  GLUCAP 191* 230* 246* 234* 205*   Hgb A1c No results found for this basename: HGBA1C,  in the last 72 hours Microbiology Recent Results (from the past 240 hour(s))  SURGICAL PCR SCREEN     Status: Abnormal   Collection Time    03/14/13  7:32 AM      Result Value Range Status   MRSA, PCR NEGATIVE  NEGATIVE Final   Staphylococcus aureus POSITIVE (*) NEGATIVE Final   Comment:            The Xpert SA Assay (FDA     approved for NASAL specimens     in patients over 53 years of age),     is one component of     a comprehensive surveillance     program.  Test performance has     been validated by [employer] for patients greater     than or equal to 33 year old.     It is not intended     to diagnose infection nor to     guide or monitor treatment.     Procedures and Diagnostic Studies: Dg Foot Complete Right  03-18-2013  *RADIOLOGY REPORT*  Clinical Data: Swollen right foot.  RIGHT FOOT COMPLETE - 3+ VIEW  Comparison: 07/19/2012  Findings: Three views of the right foot were obtained.  Again noted are large erosions involving the distal aspect of the first metatarsal bone.  Again noted  are erosions of the second toe DIP joint.  Alignment of the right foot has not changed.  There is no evidence for an acute fracture or dislocation.  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   Original Report Authenticated By: Richarda Overlie, M.D.     Scheduled Meds: . atorvastatin  20 mg Oral QHS  . calcium carbonate  1 tablet Oral BID WC  . feeding supplement  237 mL Oral BID BM  . ferrous fumarate-b12-vitamic C-folic acid  1 capsule Oral Daily  .  furosemide  40 mg Oral BID  . gabapentin  100 mg Oral TID  . hydrALAZINE  25 mg Oral TID  . insulin aspart  0-15 Units Subcutaneous TID WC  . insulin aspart  0-5 Units Subcutaneous QHS  . insulin glargine  22 Units Subcutaneous BID  . isosorbide dinitrate  20 mg Oral TID  . levothyroxine  50 mcg Oral BH-q7a  . multivitamin with minerals  1 tablet Oral Daily  . nutrition supplement  1 packet Oral BID BM  . pantoprazole  40 mg Oral Daily  . piperacillin-tazobactam (ZOSYN)  IV  3.375 g Intravenous Q8H  . polyethylene glycol  17 g Oral Daily  . senna-docusate  2 tablet Oral BID  . sodium chloride  3 mL Intravenous Q12H  . sodium chloride  3 mL Intravenous Q12H  . tamsulosin  0.4 mg Oral QHS  . vancomycin  1,000 mg Intravenous Q24H   Continuous Infusions:   Time spent: 25 minutes.   LOS: 4 days   Keyli Duross  [facility] Pager [phone].  If 8PM-8AM, please contact night-coverage at [email or web address], password [facility] 03/16/2013, 7:46 AM

## 2013-03-16 NOTE — Progress Notes (Signed)
CARE MANAGEMENT NOTE 03/16/2013  Patient:  [name], [name]   Account Number:  [id number]  Date Initiated:  03/13/2013  Documentation initiated by:  DAVIS,RHONDA  Subjective/Objective Assessment:   pt with hx of bka of the left leg, now presents with ulcerations on the right lower leg, poss sepsis     Action/Plan:   From snf PATIENT FROM [facility] IN [place].   Anticipated DC Date:  03/16/2013   Anticipated DC Plan:  SKILLED NURSING FACILITY  In-house referral  Clinical Social Worker      DC Planning Services  NA      [facility] Choice  NA   Choice offered to / List presented to:  NA   DME arranged  NA      DME agency  NA     HH arranged  NA      HH agency  NA   Status of service:  In process, will continue to follow Medicare Important Message given?  YES (If response is "NO", the following Medicare IM given date fields will be blank) Date Medicare IM given:  03/12/2013 Date Additional Medicare IM given:    Discharge Disposition:    Per UR Regulation:  Reviewed for med. necessity/level of care/duration of stay  If discussed at Long Length of Stay Meetings, dates discussed:    Comments:  [id number] Earlene Plater, RN, BSN, CCM:  CHART REVIEWED AND UPDATED.  Next chart review due on [id number]. patient is expectated to return to the OR on [id number] for further debridement to left heel NO DISCHARGE NEEDS PRESENT AT THIS TIME. CASE MANAGEMENT [phone]   [id number] Davis,RN,BSN,CCM chart reviewed patient is from snf in [place], will follow for dc needs none present at time of review.

## 2013-03-16 NOTE — Progress Notes (Signed)
Subjective: 2 Days Post-Op Procedure(s) (LRB): INCISION AND DRAINAGE (Right) APPLICATION OF WOUND VAC (Right) Patient reports pain as moderate.  No acute changes over past 24 hours.  Objective: Vital signs in last 24 hours: Temp:  [98.2 F (36.8 C)-98.5 F (36.9 C)] 98.4 F (36.9 C) (04/14 0426) Pulse Rate:  [65-83] 83 (04/14 0426) Resp:  [18-20] 20 (04/14 0426) BP: (119-130)/(59-79) 130/79 mmHg (04/14 0426) SpO2:  [100 %] 100 % (04/14 0426)  Intake/Output from previous day: 04/13 0701 - 04/14 0700 In: 1554 [P.O.:870; I.V.:334; IV Piggyback:350] Out: 1000 [Urine:1000] Intake/Output this shift:     Recent Labs  03/14/13 0456 03/15/13 0502  HGB 8.4* 8.2*    Recent Labs  03/14/13 0456 03/15/13 0502  WBC 16.0* 18.6*  RBC 3.29* 3.27*  HCT 26.1* 26.2*  PLT 276 273    Recent Labs  03/14/13 0456 03/15/13 0502  NA 141 139  K 3.7 3.9  CL 102 100  CO2 28 28  BUN 50* 55*  CREATININE 1.96* 1.97*  GLUCOSE 169* 261*  CALCIUM 9.0 9.0    Recent Labs  03/15/13 0502  INR 1.56*    Incision: dressing C/D/I VAC with good seal.  Assessment/Plan: 2 Days Post-Op Procedure(s) (LRB): INCISION AND DRAINAGE (Right) APPLICATION OF WOUND VAC (Right) Will likely return to the OR late tomorrow (Tues) for a repeat I&D.  Lashane Whelpley Y 03/16/2013, 7:20 AM

## 2013-03-17 ENCOUNTER — Encounter (HOSPITAL_COMMUNITY)
Admission: EM | Disposition: A | Discharge status: Skilled Nursing Facility | Payer: Self-pay | Source: Home / Self Care | Attending: Internal Medicine

## 2013-03-17 ENCOUNTER — Inpatient Hospital Stay (HOSPITAL_COMMUNITY): Payer: MEDICARE | Admitting: Anesthesiology

## 2013-03-17 ENCOUNTER — Encounter (HOSPITAL_COMMUNITY): Payer: Self-pay | Admitting: Anesthesiology

## 2013-03-17 DIAGNOSIS — L02419 Cutaneous abscess of limb, unspecified: Secondary | ICD-10-CM

## 2013-03-17 DIAGNOSIS — I25810 Atherosclerosis of coronary artery bypass graft(s) without angina pectoris: Secondary | ICD-10-CM

## 2013-03-17 DIAGNOSIS — N179 Acute kidney failure, unspecified: Secondary | ICD-10-CM

## 2013-03-17 DIAGNOSIS — M109 Gout, unspecified: Secondary | ICD-10-CM

## 2013-03-17 DIAGNOSIS — I4891 Unspecified atrial fibrillation: Secondary | ICD-10-CM

## 2013-03-17 DIAGNOSIS — L03119 Cellulitis of unspecified part of limb: Secondary | ICD-10-CM

## 2013-03-17 HISTORY — PX: I & D EXTREMITY: SHX5045

## 2013-03-17 LAB — BASIC METABOLIC PANEL
CO2: 31 mEq/L (ref 19–32)
Chloride: 102 mEq/L (ref 96–112)
Creatinine, Ser: 2.16 mg/dL — ABNORMAL HIGH (ref 0.50–1.35)
GFR calc Af Amer: 33 mL/min — ABNORMAL LOW (ref >90)
GFR calc non Af Amer: 28 mL/min — ABNORMAL LOW (ref >90)
Glucose, Bld: 84 mg/dL (ref 70–99)
Potassium: 3.4 mEq/L — ABNORMAL LOW (ref 3.5–5.1)
Sodium: 143 mEq/L (ref 135–145)

## 2013-03-17 LAB — GLUCOSE, CAPILLARY
Glucose-Capillary: 88 mg/dL (ref 70–99)
Glucose-Capillary: 132 mg/dL — ABNORMAL HIGH (ref 70–99)
Glucose-Capillary: 121 mg/dL — ABNORMAL HIGH (ref 70–99)

## 2013-03-17 LAB — CBC
Hemoglobin: 8.4 g/dL — ABNORMAL LOW (ref 13.0–17.0)
MCH: 24.7 pg — ABNORMAL LOW (ref 26.0–34.0)
RBC: 3.4 MIL/uL — ABNORMAL LOW (ref 4.22–5.81)
WBC: 17 10*3/uL — ABNORMAL HIGH (ref 4.0–10.5)

## 2013-03-17 LAB — SURGICAL PCR SCREEN
MRSA, PCR: NEGATIVE
Staphylococcus aureus: POSITIVE — AB

## 2013-03-17 SURGERY — IRRIGATION AND DEBRIDEMENT EXTREMITY
Anesthesia: General | Site: Foot | Laterality: Right | Wound class: Dirty or Infected

## 2013-03-17 MED ORDER — CEFAZOLIN SODIUM 1-5 GM-% IV SOLN
1.0000 g | Freq: Once | INTRAVENOUS | Status: AC
  Administered 2013-03-17: 1 g via INTRAVENOUS

## 2013-03-17 MED ORDER — HYDROMORPHONE HCL PF 1 MG/ML IJ SOLN: 1.0000 mg | Freq: Once | INTRAMUSCULAR | Status: DC

## 2013-03-17 MED ORDER — DEXTROSE 50 % IV SOLN
INTRAVENOUS | Status: AC
  Filled 2013-03-17: qty 50

## 2013-03-17 MED ORDER — LACTATED RINGERS IV SOLN
INTRAVENOUS | Status: DC | PRN
  Administered 2013-03-17: 18:00:00 via INTRAVENOUS

## 2013-03-17 MED ORDER — PHENYLEPHRINE HCL 10 MG/ML IJ SOLN
INTRAMUSCULAR | Status: DC | PRN
  Administered 2013-03-17: 40 ug via INTRAVENOUS

## 2013-03-17 MED ORDER — ETOMIDATE 2 MG/ML IV SOLN
INTRAVENOUS | Status: DC | PRN
  Administered 2013-03-17: 10 mg via INTRAVENOUS

## 2013-03-17 MED ORDER — SUCCINYLCHOLINE CHLORIDE 20 MG/ML IJ SOLN
INTRAMUSCULAR | Status: DC | PRN
  Administered 2013-03-17: 100 mg via INTRAVENOUS

## 2013-03-17 MED ORDER — FENTANYL CITRATE 0.05 MG/ML IJ SOLN
INTRAMUSCULAR | Status: DC | PRN
  Administered 2013-03-17: 25 ug via INTRAVENOUS
  Administered 2013-03-17: 50 ug via INTRAVENOUS
  Administered 2013-03-17: 25 ug via INTRAVENOUS
  Administered 2013-03-17: 50 ug via INTRAVENOUS
  Administered 2013-03-17 (×2): 25 ug via INTRAVENOUS

## 2013-03-17 MED ORDER — KCL IN DEXTROSE-NACL 40-5-0.9 MEQ/L-%-% IV SOLN
INTRAVENOUS | Status: DC
  Administered 2013-03-17: 23:00:00 via INTRAVENOUS
  Administered 2013-03-17: 100 mL/h via INTRAVENOUS
  Administered 2013-03-18 – 2013-03-19 (×2): via INTRAVENOUS
  Administered 2013-03-19: 100 mL/h via INTRAVENOUS
  Administered 2013-03-19 – 2013-03-20 (×2): via INTRAVENOUS
  Filled 2013-03-17 (×11): qty 1000

## 2013-03-17 MED ORDER — FENTANYL CITRATE 0.05 MG/ML IJ SOLN
25.0000 ug | INTRAMUSCULAR | Status: DC | PRN
  Administered 2013-03-17 (×3): 50 ug via INTRAVENOUS

## 2013-03-17 MED ORDER — COLCHICINE 0.6 MG PO TABS
0.6000 mg | ORAL_TABLET | Freq: Two times a day (BID) | ORAL | Status: DC
  Administered 2013-03-17 – 2013-03-20 (×6): 0.6 mg via ORAL
  Filled 2013-03-17 (×8): qty 1

## 2013-03-17 MED ORDER — DEXTROSE 50 % IV SOLN
25.0000 mL | Freq: Once | INTRAVENOUS | Status: AC | PRN
  Administered 2013-03-17: 25 mL via INTRAVENOUS

## 2013-03-17 MED ORDER — PROMETHAZINE HCL 25 MG/ML IJ SOLN: 6.2500 mg | INTRAMUSCULAR | Status: DC | PRN

## 2013-03-17 SURGICAL SUPPLY — 41 items
BAG SPEC THK2 15X12 ZIP CLS (MISCELLANEOUS) ×1
BAG ZIPLOCK 12X15 (MISCELLANEOUS) ×2 IMPLANT
BANDAGE ELASTIC 6 VELCRO ST LF (GAUZE/BANDAGES/DRESSINGS) ×1 IMPLANT
BANDAGE ESMARK 6X9 LF (GAUZE/BANDAGES/DRESSINGS) IMPLANT
BANDAGE GAUZE ELAST BULKY 4 IN (GAUZE/BANDAGES/DRESSINGS) ×1 IMPLANT
BNDG CMPR 9X6 STRL LF SNTH (GAUZE/BANDAGES/DRESSINGS)
BNDG ESMARK 6X9 LF (GAUZE/BANDAGES/DRESSINGS)
CLOTH BEACON ORANGE TIMEOUT ST (SAFETY) ×2 IMPLANT
CUFF TOURN SGL QUICK 18 (TOURNIQUET CUFF) IMPLANT
CUFF TOURN SGL QUICK 24 (TOURNIQUET CUFF)
CUFF TOURN SGL QUICK 34 (TOURNIQUET CUFF)
CUFF TRNQT CYL 24X4X40X1 (TOURNIQUET CUFF) IMPLANT
CUFF TRNQT CYL 34X4X40X1 (TOURNIQUET CUFF) IMPLANT
DRAIN PENROSE 18X1/2 LTX STRL (DRAIN) ×1 IMPLANT
DRSG ADAPTIC 3X8 NADH LF (GAUZE/BANDAGES/DRESSINGS) ×1 IMPLANT
DRSG PAD ABDOMINAL 8X10 ST (GAUZE/BANDAGES/DRESSINGS) ×4 IMPLANT
DRSG VAC ATS SM SENSATRAC (GAUZE/BANDAGES/DRESSINGS) ×4 IMPLANT
DURAPREP 26ML APPLICATOR (WOUND CARE) ×2 IMPLANT
ELECT REM PT RETURN 9FT ADLT (ELECTROSURGICAL) ×2
ELECTRODE REM PT RTRN 9FT ADLT (ELECTROSURGICAL) ×1 IMPLANT
GLOVE BIO SURGEON STRL SZ7.5 (GLOVE) ×2 IMPLANT
GLOVE BIOGEL PI IND STRL 8 (GLOVE) ×1 IMPLANT
GLOVE BIOGEL PI INDICATOR 8 (GLOVE) ×2
GLOVE ECLIPSE 8.0 STRL XLNG CF (GLOVE) ×3 IMPLANT
GOWN STRL REIN XL XLG (GOWN DISPOSABLE) ×2 IMPLANT
GRAFT TISS THERASKIN 2X3 (Tissue) IMPLANT
HANDPIECE INTERPULSE COAX TIP (DISPOSABLE) ×2
KIT BASIN OR (CUSTOM PROCEDURE TRAY) ×2 IMPLANT
PACK LOWER EXTREMITY WL (CUSTOM PROCEDURE TRAY) ×2 IMPLANT
PAD CAST 4YDX4 CTTN HI CHSV (CAST SUPPLIES) ×1 IMPLANT
PADDING CAST COTTON 4X4 STRL (CAST SUPPLIES)
PADDING CAST COTTON 6X4 STRL (CAST SUPPLIES) ×1 IMPLANT
POSITIONER SURGICAL ARM (MISCELLANEOUS) ×2 IMPLANT
SET HNDPC FAN SPRY TIP SCT (DISPOSABLE) ×1 IMPLANT
SPONGE GAUZE 4X4 12PLY (GAUZE/BANDAGES/DRESSINGS) ×2 IMPLANT
SUT ETHILON 2 0 PSLX (SUTURE) ×4 IMPLANT
SUT VIC AB 4-0 RB1 18 (SUTURE) ×1 IMPLANT
SYR CONTROL 10ML LL (SYRINGE) ×1 IMPLANT
TISSUE THERASKIN 2X3 (Tissue) ×2 IMPLANT
TOWEL OR 17X26 10 PK STRL BLUE (TOWEL DISPOSABLE) ×4 IMPLANT
WOUND VAC CANNISTER ×1 IMPLANT

## 2013-03-17 NOTE — Brief Op Note (Signed)
03/12/2013 - 03/17/2013  7:28 PM  PATIENT:  [name]  78 y.o. male  PRE-OPERATIVE DIAGNOSIS:  Right heel and ankle wound  POST-OPERATIVE DIAGNOSIS:  right heel and ankle wound  PROCEDURE:  Procedure(s): REPEAT IRRIGATION AND DEBRIDEMENT RIGHT FOOT, POSSIBLE SYNTHETIC SKIN GRAFT (Right) ** [place] skin graft was applied to right heel  SURGEON:  Surgeon(s) and Role:    * Kathryne Hitch, MD - Primary  PHYSICIAN ASSISTANT: Rexene Edison, PA-C   ANESTHESIA:   general  EBL:  Total I/O In: 500 [I.V.:500] Out: -   BLOOD ADMINISTERED:none  DRAINS: none   LOCAL MEDICATIONS USED:  NONE  SPECIMEN:  No Specimen  DISPOSITION OF SPECIMEN:  N/A  COUNTS:  YES  TOURNIQUET:  * No tourniquets in log *  DICTATION: .Other Dictation: Dictation Number [id number]  PLAN OF CARE: Admit to inpatient   PATIENT DISPOSITION:  PACU - hemodynamically stable.   Delay start of Pharmacological VTE agent (>24hrs) due to surgical blood loss or risk of bleeding: no

## 2013-03-17 NOTE — Transfer of Care (Signed)
Immediate Anesthesia Transfer of Care Note  Patient: [name]  Procedure(s) Performed: Procedure(s) (LRB): REPEAT IRRIGATION AND DEBRIDEMENT RIGHT FOOT, POSSIBLE SYNTHETIC SKIN GRAFT (Right)  Patient Location: PACU  Anesthesia Type: General  Level of Consciousness: sedated, patient cooperative and responds to stimulaton  Airway & Oxygen Therapy: Patient Spontanous Breathing and Patient connected to face mask oxgen  Post-op Assessment: Report given to PACU RN and Post -op Vital signs reviewed and stable  Post vital signs: Reviewed and stable  Complications: No apparent anesthesia complications

## 2013-03-17 NOTE — Anesthesia Postprocedure Evaluation (Signed)
  Anesthesia Post-op Note  Patient: [name]  Procedure(s) Performed: Procedure(s): REPEAT IRRIGATION AND DEBRIDEMENT RIGHT FOOT, POSSIBLE SYNTHETIC SKIN GRAFT (Right)  Patient Location: PACU  Anesthesia Type:General  Level of Consciousness: awake, alert , oriented, patient cooperative and responds to stimulation  Airway and Oxygen Therapy: Patient connected to nasal cannula oxygen  Post-op Pain: mild  Post-op Assessment: Post-op Vital signs reviewed, Patient's Cardiovascular Status Stable, Respiratory Function Stable, Patent Airway, No signs of Nausea or vomiting and Pain level controlled  Post-op Vital Signs: Reviewed and stable  Complications: No apparent anesthesia complications

## 2013-03-17 NOTE — Anesthesia Preprocedure Evaluation (Addendum)
Anesthesia Evaluation  Patient identified by MRN, date of birth, ID band Patient awake    Reviewed: Allergy & Precautions, H&P , NPO status , Patient's Chart, lab work & pertinent test results  Airway Mallampati: III TM Distance: <3 FB Neck ROM: Full    Dental no notable dental hx.    Pulmonary sleep apnea , COPD breath sounds clear to auscultation  Pulmonary exam normal       Cardiovascular hypertension, + CABG, + Peripheral Vascular Disease and +CHF + dysrhythmias Atrial Fibrillation + Cardiac Defibrillator Rhythm:Regular Rate:Normal  EF 25%   Neuro/Psych negative neurological ROS  negative psych ROS   GI/Hepatic negative GI ROS, Neg liver ROS,   Endo/Other  diabetesHypothyroidism   Renal/GU CRF and Renal InsufficiencyRenal disease  negative genitourinary   Musculoskeletal negative musculoskeletal ROS (+)   Abdominal   Peds negative pediatric ROS (+)  Hematology negative hematology ROS (+)   Anesthesia Other Findings   Reproductive/Obstetrics negative OB ROS                          Anesthesia Physical Anesthesia Plan  ASA: IV  Anesthesia Plan: General   Post-op Pain Management:    Induction: Intravenous  Airway Management Planned: Oral ETT and Video Laryngoscope Planned  Additional Equipment:   Intra-op Plan:   Post-operative Plan: Extubation in OR  Informed Consent: I have reviewed the patients History and Physical, chart, labs and discussed the procedure including the risks, benefits and alternatives for the proposed anesthesia with the patient or authorized representative who has indicated his/her understanding and acceptance.   Dental advisory given  Plan Discussed with: CRNA and Surgeon  Anesthesia Plan Comments:         Anesthesia Quick Evaluation

## 2013-03-17 NOTE — Progress Notes (Signed)
[facility] PROGRESS NOTE  JONHATAN HEARTY ZOX:[id number] DOB: [birth year] DOA: 03/12/2013 PCP: Colette Ribas, MD  Brief narrative: [name] is an 78 y.o. male with a past medical history of coronary artery disease, ischemic cardiomyopathy with an EF of 20% status post AICD placement, diabetes, hypertension, peripheral vascular disease status post left BKA a few months ago who was sent to rehabilitation and who presented to the hospital on 03/12/2013 with a nonhealing diabetic wound of his right heel with overlying cellulitis. Outpatient angiogram reportedly without significant stenosis. Underwent I&D and wash out of the wound with placement of a wound VAC on 03/14/2013.  For repeat I&D/graft today.  Assessment/Plan: Principal Problem:   Cellulitis of right leg -Admitted and put on empiric broad-spectrum antibiotics including vancomycin and Zosyn. -Right lower extremity Doppler negative for DVT. Plain films of the foot showed no change in chronic erosions. -Status post evaluation by Dr. Magnus Ivan who performed an incision and drainage with washout of the wound and placement of a wound VAC on 03/14/2013. Will need repeat I&D of his left foot and possible synthetic graft to heal today. -Unfortunately blood cultures not done on admission, and there is no culture data to guide therapy.  MRSA nasal swab was positive however. Active Problems:    Right knee pain -No frank erythema or effusion.  Very tender to touch.  Uric acid level elevated.  Will start colchicine.  CKD precludes NSAIDS and would prefer to avoid steroids given infection. -Continue dilaudid, oxy IR, and OxyContin.   Hypothyroidism -Continue Synthroid.   Hypokalemia -Replaced.   Hx of CABG x 6 2003 -Continue statin   ICD in place, MDT May 2011 -Frequent runs of nonsustained V. tach on telemetry noted. No discharge of AICD noted.   PAF (paroxysmal atrial fibrillation) -Maintained on chronic Coumadin therapy.  Taken  off per daughter's recollection. -Will need to touch base with Dr. Royann Shivers on 03/16/13.  Records requested.  Spoke with Dr. Erin Hearing office staff who confirm that the patient was on Xarelto 15 mg daily PTA.  Consider resuming post-operatively if stable. -INR supratherapeutic on admission.   Diabetes mellitus type 2, insulin dependent -Continue Lantus 25 units Q HS and insulin resistant sliding scale. CBGs 67-212. -Hemoglobin A1c 8%. -Hypoglycemic spell after being made NPO, will add dextrose to IVF while NPO.   CKD (chronic kidney disease) stage 4, GFR 15-29 ml/min -Creatinine at usual baseline values. Continue to monitor.   Anemia due to chronic illness -Hemoglobin stable. No indication for transfusion. -Continue iron supplement.   Ischemic cardiomyopathy, EF 25% 2D 12/12 -Monitor intake and outputs closely. Keep euvolemic. -Continue Lasix, hydralazine, Isordil.  Code Status: Full code  Family Communication: No family at the bedside. Disposition Plan: Will likely need to go back to SNF.     Medical Consultants:  Dr. Doneen Poisson, Orthopedic Surgery  Other Consultants:  Wound care RN  Anti-infectives:  Vancomycin 03/12/2013--->  Zosyn 03/12/2013--->  HPI/Subjective: [name] is having a lot of RLE pain.  He is otherwise without complaint, though he does not provide much detail on how he is feeling, when asked.  Complains of being thirsty.  Objective: Filed Vitals:   03/16/13 1700 03/16/13 2157 03/17/13 0648 03/17/13 1300  BP: 124/86 132/65 126/64 121/69  Pulse: 83 83 82 89  Temp:  98 F (36.7 C) 98.1 F (36.7 C) 98.2 F (36.8 C)  TempSrc:  Oral Oral Oral  Resp:  16 18 17   Height:  Weight:      SpO2:  97% 96% 97%    Intake/Output Summary (Last 24 hours) at 03/17/13 1410 Last data filed at 03/17/13 8119  Gross per 24 hour  Intake    540 ml  Output   1200 ml  Net   -660 ml    Exam: Gen:  NAD Cardiovascular:  Occasional ectopy, No  M/R/G Respiratory:  Lungs CTAB Gastrointestinal:  Abdomen soft, NT/ND, + BS Extremities:  Left BKA, right heel with VAC in place.  Data Reviewed: Basic Metabolic Panel:  Recent Labs Lab 03/17/2013 1130 03/13/13 0435 03/14/13 0456 03/15/13 0502 03/17/13 0525  NA 138 139 141 139 143  K 2.9* 3.1* 3.7 3.9 3.4*  CL 98 101 102 100 102  CO2 26 27 28 28 31   GLUCOSE 173* 91 169* 261* 84  BUN 50* 46* 50* 55* 61*  CREATININE 2.05* 1.92* 1.96* 1.97* 2.16*  CALCIUM 9.0 8.9 9.0 9.0 9.0  MG  --  2.2  --   --   --    GFR Estimated Creatinine Clearance: 34.1 ml/min (by C-G formula based on Cr of 2.16). Liver Function Tests:  Recent Labs Lab 03/13/13 0435  AST 18  ALT 5  ALKPHOS 100  BILITOT 1.0  PROT 6.3  ALBUMIN 2.0*   Coagulation profile  Recent Labs Lab 17-Mar-2013 1336 03/13/13 0435 03/15/13 0502  INR 3.19* 2.24* 1.56*    CBC:  Recent Labs Lab 17-Mar-2013 1130 03/13/13 0435 03/14/13 0456 03/15/13 0502 03/17/13 0525  WBC 13.7* 14.8* 16.0* 18.6* 17.0*  NEUTROABS 11.7*  --   --   --   --   HGB 9.5* 8.6* 8.4* 8.2* 8.4*  HCT 29.6* 27.1* 26.1* 26.2* 27.0*  MCV 78.7 78.6 79.3 80.1 79.4  PLT 240 247 276 273 318   CBG:  Recent Labs Lab 03/16/13 1638 03/16/13 2200 03/17/13 0806 03/17/13 0847 03/17/13 1159  GLUCAP 212* 119* 67* 109* 88   Hgb A1c No results found for this basename: HGBA1C,  in the last 72 hours Microbiology Recent Results (from the past 240 hour(s))  SURGICAL PCR SCREEN     Status: Abnormal   Collection Time    03/14/13  7:32 AM      Result Value Range Status   MRSA, PCR NEGATIVE  NEGATIVE Final   Staphylococcus aureus POSITIVE (*) NEGATIVE Final   Comment:            The Xpert SA Assay (FDA     approved for NASAL specimens     in patients over 58 years of age),     is one component of     a comprehensive surveillance     program.  Test performance has     been validated by [employer] for patients greater     than or equal to 1 year  old.     It is not intended     to diagnose infection nor to     guide or monitor treatment.     Procedures and Diagnostic Studies: Dg Foot Complete Right  Mar 17, 2013  *RADIOLOGY REPORT*  Clinical Data: Swollen right foot.  RIGHT FOOT COMPLETE - 3+ VIEW  Comparison: 07/19/2012  Findings: Three views of the right foot were obtained.  Again noted are large erosions involving the distal aspect of the first metatarsal bone.  Again noted are erosions of the second toe DIP joint.  Alignment of the right foot has not changed.  There is no  evidence for an acute fracture or dislocation.  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   Original Report Authenticated By: Richarda Overlie, M.D.     Scheduled Meds: . atorvastatin  20 mg Oral QHS  . calcium carbonate  1 tablet Oral BID WC  . dextrose      . feeding supplement  237 mL Oral BID BM  . ferrous fumarate-b12-vitamic C-folic acid  1 capsule Oral Daily  . furosemide  40 mg Oral BID  . gabapentin  100 mg Oral TID  . hydrALAZINE  25 mg Oral TID  . insulin aspart  0-20 Units Subcutaneous TID WC  . insulin aspart  0-5 Units Subcutaneous QHS  . insulin glargine  25 Units Subcutaneous QHS  . isosorbide dinitrate  20 mg Oral TID  . levothyroxine  50 mcg Oral BH-q7a  . multivitamin with minerals  1 tablet Oral Daily  . nutrition supplement  1 packet Oral BID BM  . OxyCODONE  15 mg Oral Q12H  . pantoprazole  40 mg Oral Daily  . piperacillin-tazobactam (ZOSYN)  IV  3.375 g Intravenous Q8H  . polyethylene glycol  17 g Oral Daily  . senna-docusate  2 tablet Oral BID  . sodium chloride  3 mL Intravenous Q12H  . sodium chloride  3 mL Intravenous Q12H  . tamsulosin  0.4 mg Oral QHS  . vancomycin  1,000 mg Intravenous Q24H   Continuous Infusions:   Time spent: 25 minutes.   LOS: 5 days   RAMA,CHRISTINA  [facility] Pager [phone].  If 8PM-8AM, please contact night-coverage at [email or web address],  password [facility] 03/17/2013, 2:10 PM

## 2013-03-17 NOTE — Progress Notes (Signed)
Subjective: 3 Days Post-Op Procedure(s) (LRB): INCISION AND DRAINAGE (Right) APPLICATION OF WOUND VAC (Right) Patient reports pain as moderate.    Objective: Vital signs in last 24 hours: Temp:  [98 F (36.7 C)-98.9 F (37.2 C)] 98.1 F (36.7 C) (04/15 0648) Pulse Rate:  [82-86] 82 (04/15 0648) Resp:  [16-19] 18 (04/15 0648) BP: (119-132)/(64-86) 126/64 mmHg (04/15 0648) SpO2:  [96 %-100 %] 96 % (04/15 0648)  Intake/Output from previous day: 04/14 0701 - 04/15 0700 In: 710 [P.O.:480; I.V.:80; IV Piggyback:150] Out: 1200 [Urine:1200] Intake/Output this shift:     Recent Labs  03/15/13 0502 03/17/13 0525  HGB 8.2* 8.4*    Recent Labs  03/15/13 0502 03/17/13 0525  WBC 18.6* 17.0*  RBC 3.27* 3.40*  HCT 26.2* 27.0*  PLT 273 318    Recent Labs  03/15/13 0502 03/17/13 0525  NA 139 143  K 3.9 3.4*  CL 100 102  CO2 28 31  BUN 55* 61*  CREATININE 1.97* 2.16*  GLUCOSE 261* 84  CALCIUM 9.0 9.0    Recent Labs  03/15/13 0502  INR 1.56*   Right lower leg Sensation intact distally Intact pulses distally Wound vac in place Decreased erythema and edema right distal leg Calf soft positive tenderness  Assessment/Plan: 3 Days Post-Op Procedure(s) (LRB): INCISION AND DRAINAGE (Right) APPLICATION OF WOUND VAC (Right)  Plan to take patient patient to OR today for I&D and application of graft to lower leg NPO  [name] 03/17/2013, 8:22 AM

## 2013-03-17 NOTE — Progress Notes (Signed)
Hypoglycemic Event  CBG: 67  Treatment: D50 IV 25 mL  Symptoms: None  Follow-up CBG: [id number] CBG Result:109  Possible Reasons for Event: Other: NPO for OR  Comments/MD notifiedDr Rama    Natasha Mead  Remember to initiate Hypoglycemia Order Set & complete

## 2013-03-18 ENCOUNTER — Encounter (HOSPITAL_COMMUNITY): Payer: Self-pay | Admitting: Orthopaedic Surgery

## 2013-03-18 DIAGNOSIS — N179 Acute kidney failure, unspecified: Secondary | ICD-10-CM

## 2013-03-18 DIAGNOSIS — N189 Chronic kidney disease, unspecified: Secondary | ICD-10-CM

## 2013-03-18 DIAGNOSIS — L0390 Cellulitis, unspecified: Secondary | ICD-10-CM

## 2013-03-18 DIAGNOSIS — I5022 Chronic systolic (congestive) heart failure: Secondary | ICD-10-CM

## 2013-03-18 DIAGNOSIS — D638 Anemia in other chronic diseases classified elsewhere: Secondary | ICD-10-CM

## 2013-03-18 DIAGNOSIS — I509 Heart failure, unspecified: Secondary | ICD-10-CM

## 2013-03-18 LAB — BASIC METABOLIC PANEL
Calcium: 8.8 mg/dL (ref 8.4–10.5)
GFR calc Af Amer: 37 mL/min — ABNORMAL LOW (ref >90)
GFR calc non Af Amer: 32 mL/min — ABNORMAL LOW (ref >90)
Glucose, Bld: 166 mg/dL — ABNORMAL HIGH (ref 70–99)
Potassium: 3.6 mEq/L (ref 3.5–5.1)
Sodium: 143 mEq/L (ref 135–145)

## 2013-03-18 LAB — GLUCOSE, CAPILLARY
Glucose-Capillary: 147 mg/dL — ABNORMAL HIGH (ref 70–99)
Glucose-Capillary: 313 mg/dL — ABNORMAL HIGH (ref 70–99)

## 2013-03-18 LAB — CBC
Hemoglobin: 8.5 g/dL — ABNORMAL LOW (ref 13.0–17.0)
MCHC: 31.7 g/dL (ref 30.0–36.0)
Platelets: 308 10*3/uL (ref 150–400)
RDW: 17.3 % — ABNORMAL HIGH (ref 11.5–15.5)

## 2013-03-18 MED ORDER — RIVAROXABAN 15 MG PO TABS
15.0000 mg | ORAL_TABLET | Freq: Every day | ORAL | Status: DC
  Administered 2013-03-18 – 2013-03-20 (×3): 15 mg via ORAL
  Filled 2013-03-18 (×3): qty 1

## 2013-03-18 MED ORDER — JUVEN PO PACK
1.0000 | PACK | ORAL | Status: DC
  Administered 2013-03-19 – 2013-03-20 (×2): 1 via ORAL
  Filled 2013-03-18 (×2): qty 1

## 2013-03-18 MED FILL — Cefazolin in D5W Inj 1 GM/50ML: INTRAVENOUS | Qty: 50 | Status: AC

## 2013-03-18 NOTE — Progress Notes (Signed)
Subjective: 1 Day Post-Op Procedure(s) (LRB): REPEAT IRRIGATION AND DEBRIDEMENT RIGHT FOOT, POSSIBLE SYNTHETIC SKIN GRAFT (Right) Patient reports pain as moderate.    Objective: Vital signs in last 24 hours: Temp:  [98.2 F (36.8 C)-98.7 F (37.1 C)] 98.3 F (36.8 C) (04/16 0448) Pulse Rate:  [86-98] 87 (04/16 0448) Resp:  [10-19] 16 (04/16 0448) BP: (121-172)/(61-83) 133/77 mmHg (04/16 0448) SpO2:  [97 %-100 %] 100 % (04/16 0256)  Intake/Output from previous day: 04/15 0701 - 04/16 0700 In: 2739 [I.V.:2439; IV Piggyback:300] Out: 1175 [Urine:1175] Intake/Output this shift:     Recent Labs  03/17/13 0525 03/18/13 0415  HGB 8.4* 8.5*    Recent Labs  03/17/13 0525 03/18/13 0415  WBC 17.0* 18.7*  RBC 3.40* 3.35*  HCT 27.0* 26.8*  PLT 318 308    Recent Labs  03/17/13 0525 03/18/13 0415  NA 143 143  K 3.4* 3.6  CL 102 104  CO2 31 31  BUN 61* 51*  CREATININE 2.16* 1.94*  GLUCOSE 84 166*  CALCIUM 9.0 8.8   No results found for this basename: LABPT, INR,  in the last 72 hours  Right lower Extremity: Sensation intact distally Intact pulses distally Compartment soft Tenderness in right calf Dressing clean dry and intact Wound vac in place   Assessment/Plan: 1 Day Post-Op Procedure(s) (LRB): REPEAT IRRIGATION AND DEBRIDEMENT RIGHT FOOT, POSSIBLE SYNTHETIC SKIN GRAFT (Right) Afebrile,  leukocytosis will monitor Float right heel  Continue antibiotics   CLARK, GILBERT 03/18/2013, 8:13 AM

## 2013-03-18 NOTE — Progress Notes (Signed)
[facility] PROGRESS NOTE  Bruce MATUSEK AVW:[id number] DOB: 12/26/1934 DOA: 03/12/2013 PCP: Colette Ribas, MD  Brief narrative: 78 y.o. male with a past medical history of coronary artery disease, ischemic cardiomyopathy with an EF of 20% status post AICD placement, diabetes, hypertension, peripheral vascular disease status post left BKA a few months ago who was sent to rehabilitation and who presented to the hospital on 03/12/2013 with a nonhealing diabetic wound of his right heel with overlying cellulitis. Outpatient angiogram reportedly without significant stenosis. Underwent I&D and wash out of the wound with placement of a wound VAC on 03/14/2013. For possible repeat I&D/graft per orthopedic surgery recommendations.  Assessment/Plan:   Principal Problem:  *Cellulitis of right leg  - Continue broad spectrum antibiotics: vanco and zosyn - Right lower extremity doppler negative for DVT - Status post evaluation by Dr. Magnus Ivan who performed an incision and drainage with washout of the wound and placement of a wound VAC on 03/14/2013. Will need repeat I&D of his left foot and possible synthetic graft to promote healing - Blood cultures not done on admission, and there is no culture data to guide therapy. MRSA nasal swab was positive however.   Active Problems:  Right knee pain, gout - Started colchicine; pain improving  - Continue dilaudid 1 mg Q 4 hours IV PRN severe pain, oxy IR 10 mg PO Q 4 hours PRN moderate pain, and OxyContin 15 mg Q 12 hours scheduled Hypothyroidism  - Continue Synthroid.  Hypokalemia  -Replaced. Potasssium is WNL Hx of CABG x 6 2003  - Continue atorvastatin 20 mg at bedtime  ICD in place, MDT May 2011  - patient had frequent runs of nonsustained V. tach on telemetry yesterday, 4/15. Asymptomatic PAF (paroxysmal atrial fibrillation)  - resume xarelto Diabetes mellitus type 2, insulin dependent; uncontrolled with complications of neuropathy -Continue  Lantus 25 units Q HS and insulin resistant sliding scale -Hemoglobin A1c 8%.   - continue gabapentin CKD (chronic kidney disease) stage 4, GFR 15-29 ml/min  - creatinine at baseline values  Anemia due to chronic illness  - hemoglobin stable at 8.5; no indications for transfusion - no sings of active bleed.  Ischemic cardiomyopathy, EF 25% 2D 12/12  - continue lasix 40 mg BID, hydralazine 25 mg TID and isordil 20 mg TID  Code Status: Full code  Family Communication: No family at the bedside.  Disposition Plan: to SNF in next 24 to 48 hours   Medical Consultants:  Dr. Doneen Poisson, Orthopedic Surgery Other Consultants:  Wound care RN Anti-infectives:  Vancomycin 03/12/2013--->  Zosyn 03/12/2013--->    Manson Passey, MD  [facility] Pager [phone]  If 7PM-7AM, please contact night-coverage [email or web address] Password [facility] 03/18/2013, 7:53 AM   LOS: 6 days    HPI/Subjective: No acute overnight events.  Objective: Filed Vitals:   03/17/13 2015 03/17/13 2035 03/18/13 0256 03/18/13 0448  BP: 123/61 135/77 129/67 133/77  Pulse: 89 88 86 87  Temp: 98.4 F (36.9 C) 98.3 F (36.8 C) 98.3 F (36.8 C) 98.3 F (36.8 C)  TempSrc:    Oral  Resp: 19 16 17 16   Height:      Weight:      SpO2: 100% 100% 100%     Intake/Output Summary (Last 24 hours) at 03/18/13 0753 Last data filed at 03/18/13 0700  Gross per 24 hour  Intake   2739 ml  Output   1175 ml  Net   1564 ml    Exam:   General:  Pt is alert, follows commands appropriately, not in acute distress  Cardiovascular: Regular rate and rhythm, S1/S2, appreciated  Respiratory: Clear to auscultation bilaterally, no wheezing, no crackles, no rhonchi  Abdomen: Soft, non tender, non distended, bowel sounds present, no guarding  Extremities: left BKA; right lower extremity edema, cellulitis improving   Neuro: Grossly nonfocal  Data Reviewed: Basic Metabolic Panel:  Recent Labs Lab 03/13/13 0435 03/14/13 0456  03/15/13 0502 03/17/13 0525 03/18/13 0415  NA 139 141 139 143 143  K 3.1* 3.7 3.9 3.4* 3.6  CL 101 102 100 102 104  CO2 27 28 28 31 31   GLUCOSE 91 169* 261* 84 166*  BUN 46* 50* 55* 61* 51*  CREATININE 1.92* 1.96* 1.97* 2.16* 1.94*  CALCIUM 8.9 9.0 9.0 9.0 8.8  MG 2.2  --   --   --   --    Liver Function Tests:  Recent Labs Lab 03/13/13 0435  AST 18  ALT 5  ALKPHOS 100  BILITOT 1.0  PROT 6.3  ALBUMIN 2.0*   No results found for this basename: LIPASE, AMYLASE,  in the last 168 hours No results found for this basename: AMMONIA,  in the last 168 hours CBC:  Recent Labs Lab 03/12/13 1130 03/13/13 0435 03/14/13 0456 03/15/13 0502 03/17/13 0525 03/18/13 0415  WBC 13.7* 14.8* 16.0* 18.6* 17.0* 18.7*  NEUTROABS 11.7*  --   --   --   --   --   HGB 9.5* 8.6* 8.4* 8.2* 8.4* 8.5*  HCT 29.6* 27.1* 26.1* 26.2* 27.0* 26.8*  MCV 78.7 78.6 79.3 80.1 79.4 80.0  PLT 240 247 276 273 318 308   Cardiac Enzymes: No results found for this basename: CKTOTAL, CKMB, CKMBINDEX, TROPONINI,  in the last 168 hours BNP: No components found with this basename: POCBNP,  CBG:  Recent Labs Lab 03/17/13 0847 03/17/13 1159 03/17/13 1703 03/17/13 1941 03/18/13 0722  GLUCAP 109* 88 132* 121* 147*    SURGICAL PCR SCREEN     Status: Abnormal   Collection Time    03/14/13  7:32 AM      Result Value Range Status   MRSA, PCR NEGATIVE  NEGATIVE Final   Staphylococcus aureus POSITIVE (*) NEGATIVE Final  SURGICAL PCR SCREEN     Status: Abnormal   Collection Time    03/17/13 10:40 AM      Result Value Range Status   MRSA, PCR NEGATIVE  NEGATIVE Final   Staphylococcus aureus POSITIVE (*) NEGATIVE Final     Studies: No results found.  Scheduled Meds: . atorvastatin  20 mg Oral QHS  . calcium carbonate  1 tablet Oral BID WC  . colchicine  0.6 mg Oral BID  . ferrous fumarate-b12-vitamic C-folic acid  1 capsule Oral Daily  . furosemide  40 mg Oral BID  . gabapentin  100 mg Oral TID  .  hydrALAZINE  25 mg Oral TID  . insulin aspart  0-20 Units Subcutaneous TID WC  . insulin aspart  0-5 Units Subcutaneous QHS  . insulin glargine  25 Units Subcutaneous QHS  . isosorbide dinitrate  20 mg Oral TID  . levothyroxine  50 mcg Oral BH-q7a  . multivitamin   1 tablet Oral Daily  . OxyCODONE  15 mg Oral Q12H  . pantoprazole  40 mg Oral Daily  .  (ZOSYN)  IV  3.375 g Intravenous Q8H  . polyethylene glycol  17 g Oral Daily  . senna-docusate  2 tablet Oral BID  . tamsulosin  0.4 mg Oral QHS  . vancomycin  1,000 mg Intravenous Q24H   Continuous Infusions: . dextrose 5 % and 0.9 % NaCl with KCl 40 mEq/L 100 mL/hr at 03/17/13 2320

## 2013-03-18 NOTE — Progress Notes (Signed)
NUTRITION FOLLOW UP  Intervention:   Change diet order to "not appropriate for room service" Continue Glucerna BID  Continue Juven once daily Continue Multivitamin with minerals daily Discontinue Snacks BID   Recommend appetite stimulant Recommend daily weights   Nutrition Dx:   Unintentional wt loss related to poor appetite and nausea as evidenced by 17% wt loss in less than 3 months; ongoing   Goal:   Pt to meet >/= 90% of their estimated nutrition needs; not met   Monitor:   Wt; no new wt Po intake; <50% per pt's reports (0-50% per nursing note) Labs; elevated creatinine and BUN, low hemoglobin  Assessment:   Pt reports that his appetite is better but, has only been consuming <50% of meals and also states he has had nausea twice since admission and is still not interested in eating.  Pt reported drinking 3 Glucerna shakes daily and 1 Juven daily; question accuracy of pt's report since he is only ordered 2 Glucerna daily. Pt does not want snacks in between meals, wants standard trays to be automatically sent to him, and wants to continue to receive Glucerna shakes daily.  Height: Ht Readings from Last 1 Encounters:  03/12/13 6\' 2"  (1.88 m)    Weight Status:   Wt Readings from Last 1 Encounters:  03/12/13 180 lb (81.647 kg)    Re-estimated needs:  Kcal: 1940-2260  Protein: 98-114 grams  Fluid: 2.4 L  Skin: +2 RLE edema; stage 3 pressure ulcer on right heel, stage 2 pressure ulcer on sacrum, groin, and buttocks  Diet Order: Carb Control   Intake/Output Summary (Last 24 hours) at 03/18/13 1551 Last data filed at 03/18/13 0900  Gross per 24 hour  Intake 2643.33 ml  Output    675 ml  Net 1968.33 ml    Last BM: 4/15   Labs:   Recent Labs Lab 03/13/13 0435  03/15/13 0502 03/17/13 0525 03/18/13 0415  NA 139  < > 139 143 143  K 3.1*  < > 3.9 3.4* 3.6  CL 101  < > 100 102 104  CO2 27  < > 28 31 31   BUN 46*  < > 55* 61* 51*  CREATININE 1.92*  < > 1.97*  2.16* 1.94*  CALCIUM 8.9  < > 9.0 9.0 8.8  MG 2.2  --   --   --   --   GLUCOSE 91  < > 261* 84 166*  < > = values in this interval not displayed.  CBG (last 3)   Recent Labs  03/17/13 1941 03/18/13 0722 03/18/13 1134  GLUCAP 121* 147* 165*    Scheduled Meds: . atorvastatin  20 mg Oral QHS  . calcium carbonate  1 tablet Oral BID WC  . colchicine  0.6 mg Oral BID  . feeding supplement  237 mL Oral BID BM  . ferrous fumarate-b12-vitamic C-folic acid  1 capsule Oral Daily  . furosemide  40 mg Oral BID  . gabapentin  100 mg Oral TID  . hydrALAZINE  25 mg Oral TID  .  HYDROmorphone (DILAUDID) injection  1 mg Intravenous Once  . insulin aspart  0-20 Units Subcutaneous TID WC  . insulin aspart  0-5 Units Subcutaneous QHS  . insulin glargine  25 Units Subcutaneous QHS  . isosorbide dinitrate  20 mg Oral TID  . levothyroxine  50 mcg Oral BH-q7a  . multivitamin with minerals  1 tablet Oral Daily  . nutrition supplement  1 packet Oral BID BM  .  OxyCODONE  15 mg Oral Q12H  . pantoprazole  40 mg Oral Daily  . piperacillin-tazobactam (ZOSYN)  IV  3.375 g Intravenous Q8H  . polyethylene glycol  17 g Oral Daily  . rivaroxaban  15 mg Oral Daily  . senna-docusate  2 tablet Oral BID  . sodium chloride  3 mL Intravenous Q12H  . sodium chloride  3 mL Intravenous Q12H  . tamsulosin  0.4 mg Oral QHS  . vancomycin  1,000 mg Intravenous Q24H    Continuous Infusions: . dextrose 5 % and 0.9 % NaCl with KCl 40 mEq/L 100 mL/hr at 03/18/13 0900    Ian Malkin RD, LDN Inpatient Clinical Dietitian Pager: [phone] After Hours Pager: [phone]

## 2013-03-18 NOTE — Progress Notes (Signed)
ANTIBIOTIC CONSULT NOTE - FOLLOW UP  Pharmacy Consult for Vancomycin and Zosyn Indication: Cellulitis  Allergies  Allergen Reactions  . Iodine Solution (Povidone Iodine) Rash    Topical only    Patient Measurements: Height: 6\' 2"  (188 cm) Weight: 180 lb (81.647 kg) IBW/kg (Calculated) : 82.2  Vital Signs: Temp: 98.3 F (36.8 C) (04/16 0448) Temp src: Oral (04/16 0448) BP: 133/77 mmHg (04/16 0448) Pulse Rate: 87 (04/16 0448) Intake/Output from previous day: 04/15 0701 - 04/16 0700 In: 2739 [I.V.:2439; IV Piggyback:300] Out: 1175 [Urine:1175] Intake/Output from this shift:    Labs:  Recent Labs  03/17/13 0525 03/18/13 0415  WBC 17.0* 18.7*  HGB 8.4* 8.5*  PLT 318 308  CREATININE 2.16* 1.94*   Estimated Creatinine Clearance: 38 ml/min (by C-G formula based on Cr of 1.94).  Recent Labs  03/15/13 1316  VANCOTROUGH 17.3     Microbiology: Recent Results (from the past 720 hour(s))  SURGICAL PCR SCREEN     Status: Abnormal   Collection Time    03/14/13  7:32 AM      Result Value Range Status   MRSA, PCR NEGATIVE  NEGATIVE Final   Staphylococcus aureus POSITIVE (*) NEGATIVE Final   Comment:            The Xpert SA Assay (FDA     approved for NASAL specimens     in patients over 79 years of age),     is one component of     a comprehensive surveillance     program.  Test performance has     been validated by [employer] for patients greater     than or equal to 18 year old.     It is not intended     to diagnose infection nor to     guide or monitor treatment.  SURGICAL PCR SCREEN     Status: Abnormal   Collection Time    03/17/13 10:40 AM      Result Value Range Status   MRSA, PCR NEGATIVE  NEGATIVE Final   Staphylococcus aureus POSITIVE (*) NEGATIVE Final   Comment:            The Xpert SA Assay (FDA     approved for NASAL specimens     in patients over 8 years of age),     is one component of     a comprehensive surveillance     program.   Test performance has     been validated by [employer] for patients greater     than or equal to 45 year old.     It is not intended     to diagnose infection nor to     guide or monitor treatment.    Anti-infectives   Start     Dose/Rate Route Frequency Ordered Stop   03/17/13 1800  ceFAZolin (ANCEF) IVPB 1 g/50 mL premix     1 g 100 mL/hr over 30 Minutes Intravenous  Once 03/17/13 1749 03/17/13 1814   03/13/13 1400  vancomycin (VANCOCIN) IVPB 1000 mg/200 mL premix     1,000 mg 200 mL/hr over 60 Minutes Intravenous Every 24 hours 03/12/13 1710     03/12/13 1800  piperacillin-tazobactam (ZOSYN) IVPB 3.375 g     3.375 g 12.5 mL/hr over 240 Minutes Intravenous Every 8 hours 03/12/13 1710     03/12/13 1330  vancomycin (VANCOCIN) IVPB 1000 mg/200 mL premix  1,000 mg 200 mL/hr over 60 Minutes Intravenous  Once 03/12/13 1312 03/12/13 1559      Assessment:  Day #7 Vanc/Zosyn for leg/foot cellulitis s/p I&D with washout 4/12 with VAC placement and repeat I&D on 4/15 with synthetic graft to heel.  Vancomycin trough within goal on 4/13  SCr elevated but stable with CKD-IV, CrCl ~38 ml/min  WBC continue to rise but afebrile  Goal of Therapy:  Vancomycin trough level 15-20 mcg/ml  Plan:   Continue Vancomycin 1gm IV q24h  Continue Zosyn 3.375gm IV q8h (4hr extended infusions) Follow up renal function & cultures, duration of therapy Will check another vanc trough likely in a few days if still on vanc - 4/20   Hessie Knows, PharmD, BCPS Pager [phone] 03/18/2013 7:58 AM

## 2013-03-18 NOTE — Care Management (Signed)
Chart reviewed/ updated for utilization review of necessity of level of care. Pt post-op day 1 of I&D procedure. Pt from SNF in [place]. Will continue to follow.  Roxy Manns Maite Burlison,RN,BSN [phone]

## 2013-03-19 DIAGNOSIS — Z7901 Long term (current) use of anticoagulants: Secondary | ICD-10-CM

## 2013-03-19 LAB — CBC
Hemoglobin: 7.8 g/dL — ABNORMAL LOW (ref 13.0–17.0)
RBC: 3.05 MIL/uL — ABNORMAL LOW (ref 4.22–5.81)
WBC: 15.4 10*3/uL — ABNORMAL HIGH (ref 4.0–10.5)

## 2013-03-19 LAB — BASIC METABOLIC PANEL
CO2: 29 mEq/L (ref 19–32)
Glucose, Bld: 285 mg/dL — ABNORMAL HIGH (ref 70–99)
Potassium: 4.1 mEq/L (ref 3.5–5.1)
Sodium: 136 mEq/L (ref 135–145)

## 2013-03-19 LAB — GLUCOSE, CAPILLARY
Glucose-Capillary: 254 mg/dL — ABNORMAL HIGH (ref 70–99)
Glucose-Capillary: 178 mg/dL — ABNORMAL HIGH (ref 70–99)
Glucose-Capillary: 144 mg/dL — ABNORMAL HIGH (ref 70–99)

## 2013-03-19 LAB — ABO/RH: ABO/RH(D): A POS

## 2013-03-19 LAB — PREPARE RBC (CROSSMATCH)

## 2013-03-19 NOTE — Op Note (Signed)
[name], [name]               ACCOUNT NO.:  [id number]  MEDICAL RECORD NO.:  [id number]  LOCATION:                                 FACILITY:  PHYSICIAN:  Vanita Panda. Magnus Ivan, M.D.DATE OF BIRTH:  [birth year]  DATE OF PROCEDURE:  03/17/2013 DATE OF DISCHARGE:                              OPERATIVE REPORT   PREOPERATIVE DIAGNOSIS:  Right ankle and foot wounds, status post irrigation and debridement x1.  POSTOPERATIVE DIAGNOSIS:  Right ankle and foot wounds, status post irrigation and debridement x1.  PROCEDURE: 1. Irrigation and debridement of extensive wounds right heel and     ankle. 2. Placement of cadaver split-thickness skin graft to right heel     wound. 3. Placement of decubi-VAC sponge over skin graft.  SURGEON:  Vanita Panda. Magnus Ivan, M.D.  ASSISTANT:  Bruce Canal PA-C.  ANESTHESIA:  General.  BLOOD LOSS:  Less than 100 mL.  COMPLICATIONS:  None.  INDICATIONS:  [name] is a 78 year old gentleman with multiple medical problems and severe peripheral vascular disease.  He has had some reperfusion to Vascular Surgery to his right lower extremity.  He has pressure ulcer on his heel.  He ruptured his Achilles tendon and developed a significant infection.  We have taken him to the operating room already once for significant irrigation and debridement and removal of partial part of his calcaneus.  He would not consent to any type of amputation, still will try a limb salvage, and tried to save this leg. We will take him back to the OR tonight for repeat irrigation and debridement and then placement of a cadaver graft and attempt to get the coverage to save the wound.  PROCEDURE DESCRIPTION:  After informed consent was obtained, the appropriate right ankle was marked.  He was brought to the operating room, placed supine on the operating table.  General anesthesia was then obtained.  He was then turned to lateral decubitus position with hip positioners in  front and the back, padding of the down on operative extremity and padding and positioning of the head and neck and padding of the arms.  We did not remove the previous VAC from his heal and found good bleeding tissue at the heel itself.  I did find that the ankle wound still has some gross purulence removed.  All the sutures was then prepped at the ankle with ChloraPrep and Hibiclens and sterile drapes. Time-out was called to identify the correct patient, correct right foot. We then opened up his incision even more proximal at the back of the leg and found still grossly purulent material.  We placed 6 L of normal saline solution using pulsatile lavage throughout the back of the leg and into the heel.  I used a rongeur to debride more around the heel and got good bleeding tissue around the heel.  Once we were able to do this, we then reapproximated the skin proximally down to the heel and then had a heel wound measuring at least 2.5 to 3 cm x 3 cm.  We took the cadaver graft and soaked in saline and then placed against the heel wound and sewed it circumferentially and placed using interrupted 4-0  Vicryl suture.  I then placed Adaptic on the wounds and VAC sponge over this and set the VAC to suction at -75 mm pressure and we got a good seal. Dressing was placed over this.  He was awakened, extubated, and taken to the recovery room in stable condition.  All final counts were correct. There were no complications noted.     Vanita Panda. Magnus Ivan, M.D.     CYB/MEDQ  D:  03/17/2013  T:  03/18/2013  Job:  [id number]

## 2013-03-19 NOTE — Progress Notes (Signed)
Inpatient Diabetes Program Recommendations  AACE/ADA: New Consensus Statement on Inpatient Glycemic Control (2013)  Target Ranges:  Prepandial:   less than 140 mg/dL      Peak postprandial:   less than 180 mg/dL (1-2 hours)      Critically ill patients:  140 - 180 mg/dL   Reason for Visit: Hyperglycemia  Results for CRIT, OBREMSKI (MRN [id number]) as of 03/19/2013 09:59  Ref. Range 03/19/2013 04:40  Glucose Latest Range: 70-99 mg/dL 562 (H)  Results for DACOTAH, CABELLO (MRN [id number]) as of 03/19/2013 09:59  Ref. Range 03/18/2013 21:32 03/18/2013 23:57 03/19/2013 08:13  Glucose-Capillary Latest Range: 70-99 mg/dL 696 (H) 295 (H) 284 (H)    Inpatient Diabetes Program Recommendations Insulin - Basal: May benefit from increase in Lantus to 28 units QHS  Note: Will continue to follow.  Thank you. Ailene Ards, RD, LDN, CDE Inpatient Diabetes Coordinator [phone]

## 2013-03-19 NOTE — Progress Notes (Signed)
[facility] PROGRESS NOTE  [name]:[id number] DOB: [birth year] DOA: 03/12/2013 PCP: Colette Ribas, MD  Brief narrative: 78 y.o. male with a past medical history of coronary artery disease, ischemic cardiomyopathy with an EF of 20% status post AICD placement, diabetes, hypertension, peripheral vascular disease status post left BKA a few months ago who was sent to rehabilitation and who presented to the hospital on 03/12/2013 with a nonhealing diabetic wound of his right heel with overlying cellulitis. Outpatient angiogram reportedly without significant stenosis. Underwent I&D and wash out of the wound with placement of a wound VAC on 03/14/2013. Vac change tomorrow planned by ortho.  Assessment/Plan:   Principal Problem:  *Cellulitis of right leg  - Continue vanco and zosyn  - Right lower extremity doppler negative for DVT  - Status post evaluation by Dr. Magnus Ivan who performed an incision and drainage with washout of the wound and placement of a wound VAC on 03/14/2013. Will need repeat I&D of his left foot and possible synthetic graft to promote healing. For now plan to change vac at bedside tomorrow by ortho - Blood cultures not done on admission, and there is no culture data to guide therapy. MRSA nasal swab was positive however.   Active Problems:  Right knee pain, gout  - continue colchicine  - Continue dilaudid 1 mg Q 4 hours IV PRN severe pain, oxy IR 10 mg PO Q 4 hours PRN moderate pain, and OxyContin 15 mg Q 12 hours scheduled  Hypothyroidism  - Continue Synthroid.  Hypokalemia  -Replaced. Potasssium is WNL  Hx of CABG x 6 2003  - Continue atorvastatin 20 mg at bedtime  ICD in place, MDT May 2011  - stable PAF (paroxysmal atrial fibrillation)  - continue xarelto Diabetes mellitus type 2, insulin dependent; uncontrolled with complications of neuropathy  -Continue Lantus 25 units Q HS and insulin resistant sliding scale  - CBG's in past 24 hours: 313, 254  and 212 -Hemoglobin A1c 8%.  - continue gabapentin  CKD (chronic kidney disease) stage 4, GFR 15-29 ml/min  - creatinine at baseline values; trending down to 1.61 Anemia due to chronic illness  - hemoglobin dropped to 7.8 so we will transfuse 1 unit PRBC today - follow up CBC in am.  Ischemic cardiomyopathy, EF 25% 2D 12/12  - continue lasix 40 mg BID, hydralazine 25 mg TID and isordil 20 mg TID   Code Status: Full code  Family Communication: No family at the bedside.  Disposition Plan: to SNF in next 24 to 48 hours   Medical Consultants:  Dr. Doneen Poisson, Orthopedic Surgery Other Consultants:  Wound care RN Anti-infectives:  Vancomycin 03/12/2013--->  Zosyn 03/12/2013--->   Manson Passey, MD  [facility]  Pager [phone]  If 7PM-7AM, please contact night-coverage [email or web address] Password [facility] 03/19/2013, 1:34 PM   LOS: 7 days   HPI/Subjective: No acute overnight events.  Objective: Filed Vitals:   03/19/13 1045 03/19/13 1113 03/19/13 1213 03/19/13 1313  BP: 114/57 103/54 119/62 130/67  Pulse: 76 70 72 69  Temp: 97.7 F (36.5 C) 97.5 F (36.4 C) 97.5 F (36.4 C) 98.2 F (36.8 C)  TempSrc: Oral Oral Oral Oral  Resp: 20 20 18 18   Height:      Weight:      SpO2:        Intake/Output Summary (Last 24 hours) at 03/19/13 1334 Last data filed at 03/19/13 1318  Gross per 24 hour  Intake   3205 ml  Output  800 ml  Net   2405 ml    Exam:   General:  Pt is sleeping; not in acute distress  Cardiovascular: irregular rhythm, rate controlled, S1/S2 appreciated  Respiratory: Clear to auscultation bilaterally, no wheezing, no crackles, no rhonchi  Abdomen: Soft, non tender, non distended, bowel sounds present, no guarding  Extremities: dressing right lower extremity; left BKA  Neuro: Grossly nonfocal  Data Reviewed: Basic Metabolic Panel:  Recent Labs Lab 03/13/13 0435 03/14/13 0456 03/15/13 0502 03/17/13 0525 03/18/13 0415 03/19/13 0440  NA 139 141  139 143 143 136  K 3.1* 3.7 3.9 3.4* 3.6 4.1  CL 101 102 100 102 104 101  CO2 27 28 28 31 31 29   GLUCOSE 91 169* 261* 84 166* 285*  BUN 46* 50* 55* 61* 51* 45*  CREATININE 1.92* 1.96* 1.97* 2.16* 1.94* 1.61*  CALCIUM 8.9 9.0 9.0 9.0 8.8 8.7  MG 2.2  --   --   --   --   --    Liver Function Tests:  Recent Labs Lab 03/13/13 0435  AST 18  ALT 5  ALKPHOS 100  BILITOT 1.0  PROT 6.3  ALBUMIN 2.0*   No results found for this basename: LIPASE, AMYLASE,  in the last 168 hours No results found for this basename: AMMONIA,  in the last 168 hours CBC:  Recent Labs Lab 03/14/13 0456 03/15/13 0502 03/17/13 0525 03/18/13 0415 03/19/13 0440  WBC 16.0* 18.6* 17.0* 18.7* 15.4*  HGB 8.4* 8.2* 8.4* 8.5* 7.8*  HCT 26.1* 26.2* 27.0* 26.8* 24.5*  MCV 79.3 80.1 79.4 80.0 80.3  PLT 276 273 318 308 313   Cardiac Enzymes: No results found for this basename: CKTOTAL, CKMB, CKMBINDEX, TROPONINI,  in the last 168 hours BNP: No components found with this basename: POCBNP,  CBG:  Recent Labs Lab 03/18/13 1718 03/18/13 2132 03/18/13 2357 03/19/13 0813 03/19/13 1143  GLUCAP 211* 209* 313* 254* 212*    Recent Results (from the past 240 hour(s))  SURGICAL PCR SCREEN     Status: Abnormal   Collection Time    03/14/13  7:32 AM      Result Value Range Status   MRSA, PCR NEGATIVE  NEGATIVE Final   Staphylococcus aureus POSITIVE (*) NEGATIVE Final   Comment:            The Xpert SA Assay (FDA     approved for NASAL specimens     in patients over 77 years of age),     is one component of     a comprehensive surveillance     program.  Test performance has     been validated by [employer] for patients greater     than or equal to 83 year old.     It is not intended     to diagnose infection nor to     guide or monitor treatment.  SURGICAL PCR SCREEN     Status: Abnormal   Collection Time    03/17/13 10:40 AM      Result Value Range Status   MRSA, PCR NEGATIVE  NEGATIVE Final    Staphylococcus aureus POSITIVE (*) NEGATIVE Final   Comment:            The Xpert SA Assay (FDA     approved for NASAL specimens     in patients over 27 years of age),     is one component of     a comprehensive surveillance  program.  Test performance has     been validated by [facility] for patients greater     than or equal to 90 year old.     It is not intended     to diagnose infection nor to     guide or monitor treatment.     Studies: No results found.  Scheduled Meds: . atorvastatin  20 mg Oral QHS  . calcium carbonate  1 tablet Oral BID WC  . colchicine  0.6 mg Oral BID  . feeding supplement  237 mL Oral BID BM  . ferrous fumarate-b12-vitamic C-folic acid  1 capsule Oral Daily  . furosemide  40 mg Oral BID  . gabapentin  100 mg Oral TID  . hydrALAZINE  25 mg Oral TID  .  HYDROmorphone (DILAUDID) injection  1 mg Intravenous Once  . insulin aspart  0-20 Units Subcutaneous TID WC  . insulin aspart  0-5 Units Subcutaneous QHS  . insulin glargine  25 Units Subcutaneous QHS  . isosorbide dinitrate  20 mg Oral TID  . levothyroxine  50 mcg Oral BH-q7a  . multivitamin   1 tablet Oral Daily  . nutrition supplement  1 packet Oral Q24H  . OxyCODONE  15 mg Oral Q12H  . pantoprazole  40 mg Oral Daily  . piperacillin-tazobactam (ZOSYN)  IV  3.375 g Intravenous Q8H  . polyethylene glycol  17 g Oral Daily  . rivaroxaban  15 mg Oral Daily  . senna-docusate  2 tablet Oral BID  . tamsulosin  0.4 mg Oral QHS  . vancomycin  1,000 mg Intravenous Q24H   Continuous Infusions: . dextrose 5 % and 0.9 % NaCl with KCl 40 mEq/L 100 mL/hr at 03/19/13 [phone]

## 2013-03-19 NOTE — Progress Notes (Signed)
Patient ID: [name], male   DOB: 1935-10-20, 78 y.o.   MRN: [id number]  Dr. Magnus Ivan to change vac out tomorrow at bedside and evaluate wound. Will see patient later today.

## 2013-03-20 LAB — ICD DEVICE OBSERVATION

## 2013-03-20 LAB — CBC
HCT: 27.6 % — ABNORMAL LOW (ref 39.0–52.0)
Hemoglobin: 8.7 g/dL — ABNORMAL LOW (ref 13.0–17.0)
RDW: 17.6 % — ABNORMAL HIGH (ref 11.5–15.5)
WBC: 14.1 10*3/uL — ABNORMAL HIGH (ref 4.0–10.5)

## 2013-03-20 LAB — TYPE AND SCREEN: ABO/RH(D): A POS

## 2013-03-20 LAB — GLUCOSE, CAPILLARY
Glucose-Capillary: 183 mg/dL — ABNORMAL HIGH (ref 70–99)
Glucose-Capillary: 154 mg/dL — ABNORMAL HIGH (ref 70–99)

## 2013-03-20 MED ORDER — OXYCODONE HCL ER 15 MG PO T12A: 15.0000 mg | EXTENDED_RELEASE_TABLET | Freq: Two times a day (BID) | ORAL | Status: DC

## 2013-03-20 MED ORDER — OXYCODONE HCL 10 MG PO TABS: 10.0000 mg | ORAL_TABLET | ORAL | Status: DC | PRN

## 2013-03-20 MED ORDER — CLINDAMYCIN HCL 300 MG PO CAPS: 300.0000 mg | ORAL_CAPSULE | Freq: Three times a day (TID) | ORAL | Status: DC

## 2013-03-20 MED ORDER — COLCHICINE 0.6 MG PO TABS: 0.6000 mg | ORAL_TABLET | Freq: Two times a day (BID) | ORAL | Status: AC

## 2013-03-20 NOTE — Evaluation (Signed)
Physical Therapy Evaluation Patient Details Name: [name] MRN: [id number] DOB: 1935/09/26 Today's Date: 03/20/2013 Time: 7829-5621 PT Time Calculation (min): 39 min  PT Assessment / Plan / Recommendation Clinical Impression  Pt is a 78 year old male s/p I&D and wash out of R heel wound with placement of a wound VAC on 03/14/2013 and now presents with wound vac removed and skin graft.  Pt also with hx of R BKA and reports he has prosthesis but does not use.  Pt would benefit from acute PT services in order to improve independence with transfers and w/c mobility in order to prepare for d/c to SNF.  Per Dr. Eliberto Ivory note, pt is not to WB through R heel so performed lateral transfers only to avoid any WBing and then used maximove to assist from w/c to recliner.    PT Assessment  Patient needs continued PT services    Follow Up Recommendations  Supervision/Assistance - 24 hour;SNF    Does the patient have the potential to tolerate intense rehabilitation      Barriers to Discharge        Equipment Recommendations  None recommended by PT    Recommendations for Other Services     Frequency Min 3X/week    Precautions / Restrictions Precautions Precaution Comments: L BKA Restrictions Weight Bearing Restrictions: Yes RLE Weight Bearing: Non weight bearing Other Position/Activity Restrictions: no WB on heel   Pertinent Vitals/Pain Increased pain in R LE with movement, elevated R LE and floated heel on pillows once in recliner, used leg rest with wheel chair (no heel contact) and pt declined any pain meds.      Mobility  Bed Mobility Bed Mobility: Supine to Sit Supine to Sit: HOB elevated;With rails;2: Max assist Details for Bed Mobility Assistance: increased time, assist for R LE and trunk upright Transfers Transfers: Lateral/Scoot Transfers Lateral/Scoot Transfers: 2: Max assist;With Doctor, hospital: Maximove Details for Transfer Assistance: +2  for safety, pt educated on use of sliding board, required assist weight shifting to place board as well as scooting, verbal cues for UEs to assist, used maximove to transfer pt from w/c to recliner Ambulation/Gait Ambulation/Gait Assistance: Not tested (comment) Corporate treasurer: Yes Wheelchair Assistance: 5: Financial planner Details (indicate cue type and reason): verbal cues for brakes, pt states he has used w/c before however when asked to demonstrate knowledge pt prefers not to (such as when asked to turn around pt did not wish to perform so verbally and manually showed pt technique), pt required total assist for set up, tight spaces, and brakes Wheelchair Propulsion: Both upper extremities Wheelchair Parts Management: Needs assistance Distance: 60    Exercises     PT Diagnosis: Generalized weakness;Other (comment) (decreased transfer, unable to ambulate)  PT Problem List: Decreased strength;Decreased activity tolerance;Decreased mobility;Decreased knowledge of use of DME;Decreased knowledge of precautions;Pain PT Treatment Interventions: DME instruction;Functional mobility training;Patient/family education;Therapeutic exercise;Therapeutic activities;Wheelchair mobility training;Balance training   PT Goals Acute Rehab PT Goals PT Goal Formulation: With patient Time For Goal Achievement: 04/03/13 Potential to Achieve Goals: Good Pt will go Supine/Side to Sit: with supervision PT Goal: Supine/Side to Sit - Progress: Goal set today Pt will Transfer Bed to Chair/Chair to Bed: with supervision PT Transfer Goal: Bed to Chair/Chair to Bed - Progress: Goal set today Pt will Perform Home Exercise Program: with supervision, verbal cues required/provided PT Goal: Perform Home Exercise Program - Progress: Goal set today Pt will Propel Wheelchair: > 150  feet;with supervision PT Goal: Propel Wheelchair - Progress: Goal set today  Visit Information  Last  PT Received On: 03/20/13 Assistance Needed: +2    Subjective Data  Subjective: I'll do what you tell me to do.   Prior Functioning  Home Living Available Help at Discharge: Skilled Nursing Facility Type of Home: Skilled Nursing Facility Additional Comments: Pt was at rehab prior to admission and plans to return to a rehab. Prior Function Level of Independence: Needs assistance Comments: Pt reports using transferring to w/c prior to admission, states he has prosthetic leg but does not use Communication Communication: Expressive difficulties;Other (comment) (speech not clear)    Cognition  Cognition Arousal/Alertness: Awake/alert Behavior During Therapy: WFL for tasks assessed/performed Overall Cognitive Status: Within Functional Limits for tasks assessed    Extremity/Trunk Assessment Right Upper Extremity Assessment RUE ROM/Strength/Tone: WFL for tasks assessed Left Upper Extremity Assessment LUE ROM/Strength/Tone: WFL for tasks assessed Right Lower Extremity Assessment RLE ROM/Strength/Tone: Unable to fully assess;Due to pain Left Lower Extremity Assessment LLE ROM/Strength/Tone: WFL for tasks assessed (BKA)   Balance    End of Session PT - End of Session Activity Tolerance: Patient limited by pain;Patient limited by fatigue Patient left: in chair;with call bell/phone within reach;with chair alarm set Nurse Communication: Mobility status;Need for lift equipment  GP     Terrian Sentell,KATHrine E 03/20/2013, 11:22 AM Zenovia Jarred, PT, DPT 03/20/2013 Pager: [phone]

## 2013-03-20 NOTE — Discharge Summary (Signed)
Physician Discharge Summary  [name] AOZ:[id number] DOB: [birth year] DOA: 03/12/2013  PCP: Colette Ribas, MD  Admit date: 03/12/2013 Discharge date: 03/20/2013  Recommendations for Outpatient Follow-up:  1. Follow up with ortho per scheduled appointment 2. Clindamycin for cellulitis for next 10 days  Discharge Diagnoses:  Principal Problem:   Cellulitis of right leg Active Problems:   Hx of CABG x 6 2003   ICD in place, MDT May 2011   PAF (paroxysmal atrial fibrillation)   Diabetes mellitus type 2, insulin dependent   CKD (chronic kidney disease) stage 4, GFR 15-29 ml/min   Anemia due to chronic illness   Ischemic cardiomyopathy, EF 25% 2D 12/12  Discharge Condition: medically stable for discharge today to SNF  Diet recommendation: as tolerated  History of present illness:  78 y.o. male with a past medical history of coronary artery disease, ischemic cardiomyopathy with an EF of 20% status post AICD placement, diabetes, hypertension, peripheral vascular disease status post left BKA a few months ago who was sent to rehabilitation and who presented to the hospital on 03/12/2013 with a nonhealing diabetic wound of his right heel with overlying cellulitis. Outpatient angiogram reportedly without significant stenosis. Underwent I&D and wash out of the wound with placement of a wound VAC on 03/14/2013. Vac change done today by ortho.  Assessment/Plan:   Principal Problem:  *Cellulitis of right leg  - D/C vanco and zosyn and start clindamycin for next 10 days on discharge - Right lower extremity doppler negative for DVT  - Status post evaluation by Dr. Magnus Ivan who performed an incision and drainage with washout of the wound and placement of a wound VAC on 03/14/2013. Will need repeat I&D of his left foot and possible synthetic graft to promote healing. For now plan to change vac at bedside done today and pt to follow up with ortho per scheduled appointment. - Blood cultures not  done on admission, and there is no culture data to guide therapy. MRSA nasal swab was positive however.   Active Problems:  Right knee pain, gout  - continue colchicine  - on discharge continue oxy IR 10 mg PO Q 4 hours PRN moderate pain, and OxyContin 15 mg Q 12 hours scheduled  Hypothyroidism  - Continue Synthroid.  Hypokalemia  -Replaced. Potasssium is WNL  Hx of CABG x 6 2003  - Continue atorvastatin 20 mg at bedtime  ICD in place, MDT May 2011  - stable  PAF (paroxysmal atrial fibrillation)  - continue xarelto  Diabetes mellitus type 2, insulin dependent; uncontrolled with complications of neuropathy  - continue home insulin regimen; Lantus 20 units BID - CBG's in past 24 hours: 313, 254 and 212  -Hemoglobin A1c 8%.  - continue gabapentin  CKD (chronic kidney disease) stage 4, GFR 15-29 ml/min  - creatinine at baseline values; trending down to 1.61  Anemia due to chronic illness  - hemoglobin dropped to 7.8 so we will transfuse 1 unit PRBC today  - hemoglobin 8.7 today Ischemic cardiomyopathy, EF 25% 2D 12/12  - continue lasix 40 mg BID, hydralazine 25 mg TID and isordil 20 mg TID    Code Status: Full code  Family Communication: No family at the bedside.    Medical Consultants:  Dr. Doneen Poisson, Orthopedic Surgery Other Consultants:  Wound care RN Anti-infectives:  Vancomycin 03/12/2013---> 03/20/13 Zosyn 03/12/2013---> 03/20/13   Manson Passey, MD  [facility]  Pager [phone]   Discharge Exam: Filed Vitals:   03/20/13 0548  BP: 126/69  Pulse: 80  Temp: 97.7 F (36.5 C)  Resp: 18   Filed Vitals:   03/19/13 1313 03/19/13 1345 03/19/13 2117 03/20/13 0548  BP: 130/67 123/58 122/59 126/69  Pulse: 69 70 83 80  Temp: 98.2 F (36.8 C) 97.7 F (36.5 C) 97.8 F (36.6 C) 97.7 F (36.5 C)  TempSrc: Oral Oral Oral Oral  Resp: 18 18 18 18   Height:      Weight:      SpO2:   98% 100%    General: Pt is alert, follows commands appropriately, not in acute  distress Cardiovascular: Regular rate and rhythm, S1/S2 +, no murmurs, no rubs, no gallops Respiratory: Clear to auscultation bilaterally, no wheezing, no crackles, no rhonchi Abdominal: Soft, non tender, non distended, bowel sounds +, no guarding Extremities: no edema, no cyanosis, pulses palpable bilaterally DP and PT Neuro: Grossly nonfocal  Discharge Instructions  Discharge Orders   Future Orders Complete By Expires     Call MD for:  difficulty breathing, headache or visual disturbances  As directed     Call MD for:  persistant dizziness or light-headedness  As directed     Call MD for:  persistant nausea and vomiting  As directed     Call MD for:  severe uncontrolled pain  As directed     Diet - low sodium heart healthy  As directed     Discharge instructions  As directed     Comments:      Please continue taking clindamycin for next 10 days on discharge Follow up with surgery in 1 week from discharge per scheduled appointment    Increase activity slowly  As directed         Medication List    TAKE these medications       atorvastatin 20 MG tablet  Commonly known as:  LIPITOR  Take 20 mg by mouth at bedtime.     calcium carbonate 600 MG Tabs  Commonly known as:  OS-CAL  Take 600 mg by mouth 2 (two) times daily with a meal.     clindamycin 300 MG capsule  Commonly known as:  CLEOCIN  Take 1 capsule (300 mg total) by mouth 3 (three) times daily.     colchicine 0.6 MG tablet  Take 1 tablet (0.6 mg total) by mouth 2 (two) times daily.     Ferrocite Plus 106-1 MG Tabs  Take 1 tablet by mouth daily.     furosemide 40 MG tablet  Commonly known as:  LASIX  Take 40 mg by mouth 2 (two) times daily.     gabapentin 100 MG capsule  Commonly known as:  NEURONTIN  Take 100 mg by mouth 3 (three) times daily.     hydrALAZINE 25 MG tablet  Commonly known as:  APRESOLINE  Take 25 mg by mouth 3 (three) times daily.     insulin glargine 100 UNIT/ML injection  Commonly known  as:  LANTUS  Inject 20 Units into the skin 2 (two) times daily.     isosorbide dinitrate 20 MG tablet  Commonly known as:  ISORDIL  Take 20 mg by mouth 3 (three) times daily.     levothyroxine 50 MCG tablet  Commonly known as:  SYNTHROID, LEVOTHROID  Take 50 mcg by mouth every morning.     methocarbamol 500 MG tablet  Commonly known as:  ROBAXIN  Take 500 mg by mouth 4 (four) times daily as needed (for muscle spasms.).     Oxycodone HCl  10 MG Tabs  Take 1 tablet (10 mg total) by mouth every 4 (four) hours as needed (pain).     OxyCODONE 15 mg T12a  Commonly known as:  OXYCONTIN  Take 1 tablet (15 mg total) by mouth every 12 (twelve) hours.     pantoprazole 40 MG tablet  Commonly known as:  PROTONIX  Take 40 mg by mouth daily.     polyethylene glycol packet  Commonly known as:  MIRALAX / GLYCOLAX  Take 17 g by mouth daily.     Rivaroxaban 15 MG Tabs tablet  Commonly known as:  XARELTO  Take 15 mg by mouth daily.     sennosides-docusate sodium 8.6-50 MG tablet  Commonly known as:  SENOKOT-S  Take 2 tablets by mouth 2 (two) times daily.     tamsulosin 0.4 MG Caps  Commonly known as:  FLOMAX  Take 0.4 mg by mouth at bedtime.           Follow-up Information   Follow up with Kathryne Hitch, MD In 5 days.   Contact information:   [address] [place] [address] [phone]        The results of significant diagnostics from this hospitalization (including imaging, microbiology, ancillary and laboratory) are listed below for reference.    Significant Diagnostic Studies: Dg Foot Complete Right  Apr 04, 2013  *RADIOLOGY REPORT*  Clinical Data: Swollen right foot.  RIGHT FOOT COMPLETE - 3+ VIEW  Comparison: 07/19/2012  Findings: Three views of the right foot were obtained.  Again noted are large erosions involving the distal aspect of the first metatarsal bone.  Again noted are erosions of the second toe DIP joint.  Alignment of the right foot has not  changed.  There is no evidence for an acute fracture or dislocation.  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   Original Report Authenticated By: Richarda Overlie, M.D.     Microbiology: Recent Results (from the past 240 hour(s))  SURGICAL PCR SCREEN     Status: Abnormal   Collection Time    03/14/13  7:32 AM      Result Value Range Status   MRSA, PCR NEGATIVE  NEGATIVE Final   Staphylococcus aureus POSITIVE (*) NEGATIVE Final   Comment:            The Xpert SA Assay (FDA     approved for NASAL specimens     in patients over 43 years of age),     is one component of     a comprehensive surveillance     program.  Test performance has     been validated by [employer] for patients greater     than or equal to 91 year old.     It is not intended     to diagnose infection nor to     guide or monitor treatment.  SURGICAL PCR SCREEN     Status: Abnormal   Collection Time    03/17/13 10:40 AM      Result Value Range Status   MRSA, PCR NEGATIVE  NEGATIVE Final   Staphylococcus aureus POSITIVE (*) NEGATIVE Final   Comment:            The Xpert SA Assay (FDA     approved for NASAL specimens     in patients over 76 years of age),     is one component of  a comprehensive surveillance     program.  Test performance has     been validated by [facility] for patients greater     than or equal to 60 year old.     It is not intended     to diagnose infection nor to     guide or monitor treatment.     Labs: Basic Metabolic Panel:  Recent Labs Lab 03/14/13 0456 03/15/13 0502 03/17/13 0525 03/18/13 0415 03/19/13 0440  NA 141 139 143 143 136  K 3.7 3.9 3.4* 3.6 4.1  CL 102 100 102 104 101  CO2 28 28 31 31 29   GLUCOSE 169* 261* 84 166* 285*  BUN 50* 55* 61* 51* 45*  CREATININE 1.96* 1.97* 2.16* 1.94* 1.61*  CALCIUM 9.0 9.0 9.0 8.8 8.7   Liver Function Tests: No results found for this basename: AST, ALT, ALKPHOS,  BILITOT, PROT, ALBUMIN,  in the last 168 hours No results found for this basename: LIPASE, AMYLASE,  in the last 168 hours No results found for this basename: AMMONIA,  in the last 168 hours CBC:  Recent Labs Lab 03/15/13 0502 03/17/13 0525 03/18/13 0415 03/19/13 0440 03/20/13 0420  WBC 18.6* 17.0* 18.7* 15.4* 14.1*  HGB 8.2* 8.4* 8.5* 7.8* 8.7*  HCT 26.2* 27.0* 26.8* 24.5* 27.6*  MCV 80.1 79.4 80.0 80.3 79.8  PLT 273 318 308 313 337   Cardiac Enzymes: No results found for this basename: CKTOTAL, CKMB, CKMBINDEX, TROPONINI,  in the last 168 hours BNP: BNP (last 3 results) No results found for this basename: PROBNP,  in the last 8760 hours CBG:  Recent Labs Lab 03/19/13 0813 03/19/13 1143 03/19/13 1643 03/19/13 2116 03/20/13 0738  GLUCAP 254* 212* 178* 144* 183*    Time coordinating discharge: Over 30 minutes  Signed:  Manson Passey, MD  [facility]  03/20/2013, 11:12 AM  Pager #: [phone]

## 2013-03-20 NOTE — Progress Notes (Signed)
Patient ID: [name], male   DOB: 06-16-1935, 78 y.o.   MRN: [id number] [facility] removed from right ankle and heel.  Skin graft looks good.  New dressing applied.  Can discharge to SNF from my standpoint.  He needs to float his right heel at all times and should put no weight on his right heel until further notice.  I will need to see him in the office early next week to change his dressing.

## 2013-03-20 NOTE — Progress Notes (Signed)
Patient is now agreeable to a local snf and just had physical therapy this morning. However, [employer] medicare is closed today for the holiday. CSW communicated same to MD. MD is planning on discharging patient home today.  Samson Ralph C. Mena Simonis MSW, LCSW [phone]

## 2013-03-20 NOTE — Progress Notes (Signed)
Discharge to home, p/u by grandaughter, d/c instructions  And follow up appointments done and given to the granddaughter. PIV removed no s/s/ of swelling or infiltration. Dressing to right foot drat and intact.

## 2013-03-20 NOTE — Progress Notes (Signed)
Inpatient Diabetes Program Recommendations  AACE/ADA: New Consensus Statement on Inpatient Glycemic Control (2013)  Target Ranges:  Prepandial:   less than 140 mg/dL      Peak postprandial:   less than 180 mg/dL (1-2 hours)      Critically ill patients:  140 - 180 mg/dL     Results for [name], [name] (MRN [id number]) as of 03/20/2013 09:14  Ref. Range 03/19/2013 08:13 03/19/2013 11:43 03/19/2013 16:43 03/19/2013 21:16  Glucose-Capillary Latest Range: 70-99 mg/dL 409 (H) 811 (H) 914 (H) 144 (H)    Inpatient Diabetes Program Recommendations Insulin - Basal: May benefit from increase in Lantus to 28 units QHS   Will follow. Ambrose Finland RN, MSN, CDE Diabetes Coordinator Inpatient Diabetes Program [phone]

## 2013-03-20 NOTE — Progress Notes (Addendum)
[id number] Davis,RN,BSn,CCM: Patient is able to only and very unsteadily pivot from chair to bed.  Patient states that he will be willing to go to asnf just not the one that he was at.  MD on duty made aware of this.  CSW called to see if placement woulde be possible asap per the MD.  Medicare not opened today due to holiday.  MD is aware of this.  Per the MD discharge orders to home will stand that patient has had opportunities to go to snf but has refused but to this point.  States that family has stated that they are able to care for the patient and do have assistance.  Case Manager will follow the MD orders for discharge to home. tct-Medical director/ agreed on discharge to home.

## 2013-03-22 MED FILL — Cefazolin in D5W Inj 1 GM/50ML: INTRAVENOUS | Qty: 50 | Status: AC

## 2013-03-23 ENCOUNTER — Inpatient Hospital Stay (HOSPITAL_COMMUNITY)
Admission: EM | Admit: 2013-03-23 | Discharge: 2013-03-26 | DRG: 603 | Disposition: A | Discharge status: Skilled Nursing Facility | Payer: MEDICARE | Attending: Internal Medicine | Admitting: Internal Medicine

## 2013-03-23 ENCOUNTER — Encounter (HOSPITAL_COMMUNITY): Payer: Self-pay | Admitting: *Deleted

## 2013-03-23 DIAGNOSIS — I25810 Atherosclerosis of coronary artery bypass graft(s) without angina pectoris: Secondary | ICD-10-CM | POA: Diagnosis present

## 2013-03-23 DIAGNOSIS — I2589 Other forms of chronic ischemic heart disease: Secondary | ICD-10-CM | POA: Diagnosis present

## 2013-03-23 DIAGNOSIS — L03119 Cellulitis of unspecified part of limb: Principal | ICD-10-CM | POA: Diagnosis present

## 2013-03-23 DIAGNOSIS — I472 Ventricular tachycardia, unspecified: Secondary | ICD-10-CM | POA: Diagnosis present

## 2013-03-23 DIAGNOSIS — I498 Other specified cardiac arrhythmias: Secondary | ICD-10-CM | POA: Diagnosis present

## 2013-03-23 DIAGNOSIS — Z95810 Presence of automatic (implantable) cardiac defibrillator: Secondary | ICD-10-CM | POA: Diagnosis present

## 2013-03-23 DIAGNOSIS — I509 Heart failure, unspecified: Secondary | ICD-10-CM | POA: Diagnosis present

## 2013-03-23 DIAGNOSIS — I5022 Chronic systolic (congestive) heart failure: Secondary | ICD-10-CM | POA: Diagnosis present

## 2013-03-23 DIAGNOSIS — I255 Ischemic cardiomyopathy: Secondary | ICD-10-CM | POA: Diagnosis present

## 2013-03-23 DIAGNOSIS — L02419 Cutaneous abscess of limb, unspecified: Principal | ICD-10-CM | POA: Diagnosis present

## 2013-03-23 DIAGNOSIS — I4729 Other ventricular tachycardia: Secondary | ICD-10-CM | POA: Diagnosis present

## 2013-03-23 DIAGNOSIS — S88119A Complete traumatic amputation at level between knee and ankle, unspecified lower leg, initial encounter: Secondary | ICD-10-CM

## 2013-03-23 DIAGNOSIS — E039 Hypothyroidism, unspecified: Secondary | ICD-10-CM | POA: Diagnosis present

## 2013-03-23 DIAGNOSIS — N179 Acute kidney failure, unspecified: Secondary | ICD-10-CM | POA: Diagnosis present

## 2013-03-23 DIAGNOSIS — D631 Anemia in chronic kidney disease: Secondary | ICD-10-CM | POA: Diagnosis present

## 2013-03-23 DIAGNOSIS — J4489 Other specified chronic obstructive pulmonary disease: Secondary | ICD-10-CM | POA: Diagnosis present

## 2013-03-23 DIAGNOSIS — N039 Chronic nephritic syndrome with unspecified morphologic changes: Secondary | ICD-10-CM | POA: Diagnosis present

## 2013-03-23 DIAGNOSIS — Z794 Long term (current) use of insulin: Secondary | ICD-10-CM

## 2013-03-23 DIAGNOSIS — E162 Hypoglycemia, unspecified: Secondary | ICD-10-CM | POA: Diagnosis not present

## 2013-03-23 DIAGNOSIS — L03115 Cellulitis of right lower limb: Secondary | ICD-10-CM | POA: Diagnosis present

## 2013-03-23 DIAGNOSIS — I4891 Unspecified atrial fibrillation: Secondary | ICD-10-CM | POA: Diagnosis present

## 2013-03-23 DIAGNOSIS — M109 Gout, unspecified: Secondary | ICD-10-CM | POA: Diagnosis present

## 2013-03-23 DIAGNOSIS — K5900 Constipation, unspecified: Secondary | ICD-10-CM | POA: Diagnosis present

## 2013-03-23 DIAGNOSIS — I739 Peripheral vascular disease, unspecified: Secondary | ICD-10-CM | POA: Diagnosis present

## 2013-03-23 DIAGNOSIS — E785 Hyperlipidemia, unspecified: Secondary | ICD-10-CM | POA: Diagnosis present

## 2013-03-23 DIAGNOSIS — R001 Bradycardia, unspecified: Secondary | ICD-10-CM | POA: Diagnosis present

## 2013-03-23 DIAGNOSIS — D638 Anemia in other chronic diseases classified elsewhere: Secondary | ICD-10-CM | POA: Diagnosis present

## 2013-03-23 DIAGNOSIS — I129 Hypertensive chronic kidney disease with stage 1 through stage 4 chronic kidney disease, or unspecified chronic kidney disease: Secondary | ICD-10-CM | POA: Diagnosis present

## 2013-03-23 DIAGNOSIS — E1169 Type 2 diabetes mellitus with other specified complication: Secondary | ICD-10-CM | POA: Diagnosis present

## 2013-03-23 DIAGNOSIS — Z8249 Family history of ischemic heart disease and other diseases of the circulatory system: Secondary | ICD-10-CM

## 2013-03-23 DIAGNOSIS — I959 Hypotension, unspecified: Secondary | ICD-10-CM | POA: Diagnosis present

## 2013-03-23 DIAGNOSIS — I480 Paroxysmal atrial fibrillation: Secondary | ICD-10-CM | POA: Diagnosis present

## 2013-03-23 DIAGNOSIS — Z7901 Long term (current) use of anticoagulants: Secondary | ICD-10-CM

## 2013-03-23 DIAGNOSIS — J449 Chronic obstructive pulmonary disease, unspecified: Secondary | ICD-10-CM | POA: Diagnosis present

## 2013-03-23 DIAGNOSIS — E119 Type 2 diabetes mellitus without complications: Secondary | ICD-10-CM

## 2013-03-23 DIAGNOSIS — Z79899 Other long term (current) drug therapy: Secondary | ICD-10-CM

## 2013-03-23 DIAGNOSIS — S91301D Unspecified open wound, right foot, subsequent encounter: Secondary | ICD-10-CM

## 2013-03-23 DIAGNOSIS — N184 Chronic kidney disease, stage 4 (severe): Secondary | ICD-10-CM | POA: Diagnosis present

## 2013-03-23 DIAGNOSIS — N189 Chronic kidney disease, unspecified: Secondary | ICD-10-CM | POA: Diagnosis present

## 2013-03-23 DIAGNOSIS — Z87891 Personal history of nicotine dependence: Secondary | ICD-10-CM

## 2013-03-23 LAB — BASIC METABOLIC PANEL
CO2: 28 mEq/L (ref 19–32)
GFR calc non Af Amer: 29 mL/min — ABNORMAL LOW (ref >90)
Glucose, Bld: 77 mg/dL (ref 70–99)
Potassium: 3.9 mEq/L (ref 3.5–5.1)
Sodium: 144 mEq/L (ref 135–145)

## 2013-03-23 LAB — CBC WITH DIFFERENTIAL/PLATELET
Eosinophils Absolute: 0.4 10*3/uL (ref 0.0–0.7)
Lymphocytes Relative: 13 % (ref 12–46)
Lymphs Abs: 1.7 10*3/uL (ref 0.7–4.0)
Neutrophils Relative %: 77 % (ref 43–77)
Platelets: 310 10*3/uL (ref 150–400)
RBC: 3.79 MIL/uL — ABNORMAL LOW (ref 4.22–5.81)
WBC: 12.7 10*3/uL — ABNORMAL HIGH (ref 4.0–10.5)

## 2013-03-23 LAB — PROTIME-INR: Prothrombin Time: 16.8 seconds — ABNORMAL HIGH (ref 11.6–15.2)

## 2013-03-23 MED ORDER — COLCHICINE 0.6 MG PO TABS
0.6000 mg | ORAL_TABLET | Freq: Two times a day (BID) | ORAL | Status: DC
  Administered 2013-03-24 – 2013-03-26 (×5): 0.6 mg via ORAL
  Filled 2013-03-23 (×7): qty 1

## 2013-03-23 MED ORDER — ISOSORBIDE DINITRATE 20 MG PO TABS
20.0000 mg | ORAL_TABLET | Freq: Three times a day (TID) | ORAL | Status: DC
  Administered 2013-03-24 – 2013-03-26 (×7): 20 mg via ORAL
  Filled 2013-03-23 (×10): qty 1

## 2013-03-23 MED ORDER — HYDRALAZINE HCL 25 MG PO TABS
25.0000 mg | ORAL_TABLET | Freq: Three times a day (TID) | ORAL | Status: DC
  Administered 2013-03-24 – 2013-03-26 (×7): 25 mg via ORAL
  Filled 2013-03-23 (×10): qty 1

## 2013-03-23 MED ORDER — ALLOPURINOL 150 MG HALF TABLET
150.0000 mg | ORAL_TABLET | Freq: Every day | ORAL | Status: DC
  Administered 2013-03-24 – 2013-03-26 (×2): 150 mg via ORAL
  Filled 2013-03-23 (×3): qty 1

## 2013-03-23 MED ORDER — LEVOTHYROXINE SODIUM 50 MCG PO TABS
50.0000 ug | ORAL_TABLET | Freq: Every day | ORAL | Status: DC
  Administered 2013-03-24 – 2013-03-26 (×3): 50 ug via ORAL
  Filled 2013-03-23 (×4): qty 1

## 2013-03-23 MED ORDER — PIPERACILLIN-TAZOBACTAM 3.375 G IVPB
3.3750 g | Freq: Three times a day (TID) | INTRAVENOUS | Status: DC
  Administered 2013-03-24 – 2013-03-25 (×4): 3.375 g via INTRAVENOUS
  Filled 2013-03-23 (×5): qty 50

## 2013-03-23 MED ORDER — ONDANSETRON HCL 4 MG PO TABS: 4.0000 mg | ORAL_TABLET | Freq: Four times a day (QID) | ORAL | Status: DC | PRN

## 2013-03-23 MED ORDER — INSULIN ASPART 100 UNIT/ML ~~LOC~~ SOLN
0.0000 [IU] | Freq: Three times a day (TID) | SUBCUTANEOUS | Status: DC
  Administered 2013-03-24: 2 [IU] via SUBCUTANEOUS

## 2013-03-23 MED ORDER — OXYCODONE HCL 5 MG PO TABS
10.0000 mg | ORAL_TABLET | ORAL | Status: DC | PRN
  Administered 2013-03-24 – 2013-03-26 (×7): 10 mg via ORAL
  Filled 2013-03-23: qty 2

## 2013-03-23 MED ORDER — FUROSEMIDE 40 MG PO TABS
40.0000 mg | ORAL_TABLET | Freq: Two times a day (BID) | ORAL | Status: DC
  Filled 2013-03-23 (×3): qty 1

## 2013-03-23 MED ORDER — ONDANSETRON HCL 4 MG/2ML IJ SOLN
4.0000 mg | Freq: Three times a day (TID) | INTRAMUSCULAR | Status: DC | PRN
  Administered 2013-03-23: 4 mg via INTRAVENOUS
  Filled 2013-03-23: qty 2

## 2013-03-23 MED ORDER — VANCOMYCIN HCL 10 G IV SOLR
1250.0000 mg | Freq: Once | INTRAVENOUS | Status: AC
  Administered 2013-03-24: 1250 mg via INTRAVENOUS
  Filled 2013-03-23: qty 1250

## 2013-03-23 MED ORDER — SODIUM CHLORIDE 0.9 % IV SOLN: INTRAVENOUS | Status: DC | PRN

## 2013-03-23 MED ORDER — ONDANSETRON HCL 4 MG/2ML IJ SOLN
4.0000 mg | Freq: Four times a day (QID) | INTRAMUSCULAR | Status: DC | PRN
  Administered 2013-03-25: 4 mg via INTRAVENOUS
  Filled 2013-03-23: qty 2

## 2013-03-23 MED ORDER — CARVEDILOL 3.125 MG PO TABS
3.1250 mg | ORAL_TABLET | Freq: Two times a day (BID) | ORAL | Status: DC
  Administered 2013-03-24 – 2013-03-26 (×4): 3.125 mg via ORAL
  Filled 2013-03-23 (×7): qty 1

## 2013-03-23 MED ORDER — ACETAMINOPHEN 650 MG RE SUPP: 650.0000 mg | Freq: Four times a day (QID) | RECTAL | Status: DC | PRN

## 2013-03-23 MED ORDER — ACETAMINOPHEN 325 MG PO TABS: 650.0000 mg | ORAL_TABLET | Freq: Four times a day (QID) | ORAL | Status: DC | PRN

## 2013-03-23 MED ORDER — SIMVASTATIN 40 MG PO TABS
40.0000 mg | ORAL_TABLET | Freq: Every evening | ORAL | Status: DC
  Administered 2013-03-24: 40 mg via ORAL
  Filled 2013-03-23 (×3): qty 1

## 2013-03-23 MED ORDER — SODIUM CHLORIDE 0.9 % IJ SOLN
3.0000 mL | Freq: Two times a day (BID) | INTRAMUSCULAR | Status: DC
  Administered 2013-03-24 (×3): 3 mL via INTRAVENOUS

## 2013-03-23 MED ORDER — INSULIN GLARGINE 100 UNIT/ML ~~LOC~~ SOLN
20.0000 [IU] | Freq: Two times a day (BID) | SUBCUTANEOUS | Status: DC
  Administered 2013-03-24: 20 [IU] via SUBCUTANEOUS
  Filled 2013-03-23 (×3): qty 0.2

## 2013-03-23 MED ORDER — OXYCODONE HCL 5 MG PO TABS
10.0000 mg | ORAL_TABLET | ORAL | Status: DC | PRN
  Filled 2013-03-23 (×6): qty 2

## 2013-03-23 MED ORDER — PANTOPRAZOLE SODIUM 40 MG PO TBEC
40.0000 mg | DELAYED_RELEASE_TABLET | Freq: Every day | ORAL | Status: DC
  Administered 2013-03-24 – 2013-03-26 (×2): 40 mg via ORAL
  Filled 2013-03-23 (×3): qty 1

## 2013-03-23 MED ORDER — SODIUM CHLORIDE 0.9 % IJ SOLN
3.0000 mL | Freq: Two times a day (BID) | INTRAMUSCULAR | Status: DC
  Administered 2013-03-24 – 2013-03-25 (×3): 3 mL via INTRAVENOUS

## 2013-03-23 MED ORDER — POTASSIUM CHLORIDE CRYS ER 20 MEQ PO TBCR
40.0000 meq | EXTENDED_RELEASE_TABLET | Freq: Two times a day (BID) | ORAL | Status: DC
  Administered 2013-03-24: 40 meq via ORAL
  Filled 2013-03-23 (×3): qty 2

## 2013-03-23 MED ORDER — HYDROMORPHONE HCL PF 1 MG/ML IJ SOLN
1.0000 mg | INTRAMUSCULAR | Status: DC | PRN
  Administered 2013-03-23: 1 mg via INTRAVENOUS
  Filled 2013-03-23: qty 1

## 2013-03-23 NOTE — ED Notes (Addendum)
S/p right foot surgery 03/12/13 by Dr. Rayburn Ma. Pt was discharged Friday regardless of uncontrolled pain at surgical site. Family also stated that pt was supposed to have been discharged to a nrsg facility instead of home. Pt went to [facility] ED for c/o continued pain. Pt denies any fever or chills.  Pt has f/u appt with surgeon tomorrow.   Good mvmt, color to toes, skin warm to touch.

## 2013-03-23 NOTE — ED Notes (Signed)
Report given to Lafonda Mosses, rn. Pt transported to floor via stretcher.

## 2013-03-23 NOTE — ED Notes (Signed)
Pt sent here from [facility] for right foot infection.

## 2013-03-23 NOTE — ED Provider Notes (Signed)
History     CSN: [id number]  Arrival date & time 03/23/13  4098   First MD Initiated Contact with Patient 03/23/13 1724      Chief Complaint  Patient presents with  . Wound Infection    (Consider location/radiation/quality/duration/timing/severity/associated sxs/prior treatment) The history is provided by the patient and the EMS personnel.   78 year old male. Primary care doctor Dr. Phillips Odor in [place]. Patient was admitted to [facility] on April 10 for discharge to the home on April 18. Patient had orthopedic surgery done on Tuesday by Dr. Magnus Ivan had a watershed flush of a wound on the back part of his right heel" "skin graft. Patient had followup with orthopedics tomorrow. Patient called ambulance to bring the [facility] the [facility] for increased pain. Had called Dr. Magnus Ivan that he called back later patient RE been transported by ambulance to [facility]. He was diverted the [place] because when again in the ambulance he had a period of bradycardia and hypotension. [place] he was stable. Changes were made to transfer him here is accepted by Dr. Patria Mane here. Dr. Magnus Ivan was also aware and wanted the patient to be here. Family thought the patient was going to be discharged to rehabilitation center is not able to take care of him at home.  Past Medical History  Diagnosis Date  . HTN (hypertension)   . PVD (peripheral vascular disease)   . COPD (chronic obstructive pulmonary disease)   . Diabetes mellitus     IDDM  . S/P CABG x 6 2003    Dr Cornelius Moras, Myoview low risk 2009  . Gout   . Paroxysmal atrial fibrillation   . Hypothyroidism   . ICD (implantable cardiac defibrillator) discharge 5/11    MDT  . Chronic renal insufficiency, stage III (moderate)   . Chronic systolic CHF (congestive heart failure)     EF 25-30% echo 12/12  . S/P angioplasty, 11/27/12 -Lt below the knee popliteal artery 11/28/2012    Past Surgical History  Procedure Laterality Date  .  Coronary artery bypass graft    . Cardiac catheterization    . Amputation  12/08/2012    Procedure: AMPUTATION BELOW KNEE;  Surgeon: Eldred Manges, MD;  Location: [facility] OR;  Service: Orthopedics;  Laterality: Left;  . Incision and drainage Right 03/14/2013    Procedure: INCISION AND DRAINAGE;  Surgeon: Kathryne Hitch, MD;  Location: [facility] ORS;  Service: Orthopedics;  Laterality: Right;  wash out and wound vac placement  . Application of wound vac Right 03/14/2013    Procedure: APPLICATION OF WOUND VAC;  Surgeon: Kathryne Hitch, MD;  Location: [facility] ORS;  Service: Orthopedics;  Laterality: Right;  heel  . I&d extremity Right 03/17/2013    Procedure: REPEAT IRRIGATION AND DEBRIDEMENT RIGHT FOOT, POSSIBLE SYNTHETIC SKIN GRAFT;  Surgeon: Kathryne Hitch, MD;  Location: [facility] ORS;  Service: Orthopedics;  Laterality: Right;    Family History  Problem Relation Age of Onset  . Heart disease Father   . Cancer Brother     History  Substance Use Topics  . Smoking status: Former Smoker    Types: Cigarettes    Quit date: 11/05/2003  . Smokeless tobacco: Never Used  . Alcohol Use: No      Review of Systems  Constitutional: Negative for fever.  HENT: Negative for congestion.   Eyes: Negative for redness.  Respiratory: Negative for shortness of breath.   Cardiovascular: Negative for chest pain.  Gastrointestinal: Negative for nausea, vomiting and  abdominal pain.  Genitourinary: Negative for hematuria.  Musculoskeletal: Negative for back pain.  Skin: Positive for wound. Negative for rash.  Neurological: Negative for headaches.  Hematological: Does not bruise/bleed easily.  Psychiatric/Behavioral: Negative for confusion.    Allergies  Iodine solution  Home Medications   Current Outpatient Rx  Name  Route  Sig  Dispense  Refill  . allopurinol (ZYLOPRIM) 100 MG tablet   Oral   Take 150 mg by mouth daily.         . carvedilol (COREG) 12.5 MG tablet   Oral   Take 12.5 mg by  mouth 2 (two) times daily with a meal.         . clindamycin (CLEOCIN) 300 MG capsule   Oral   Take 1 capsule (300 mg total) by mouth 3 (three) times daily.   30 capsule   0   . colchicine 0.6 MG tablet   Oral   Take 1 tablet (0.6 mg total) by mouth 2 (two) times daily.   60 tablet   0   . furosemide (LASIX) 80 MG tablet   Oral   Take 80 mg by mouth 2 (two) times daily.         . hydrALAZINE (APRESOLINE) 25 MG tablet   Oral   Take 25 mg by mouth 3 (three) times daily.         . hydrochlorothiazide (HYDRODIURIL) 50 MG tablet   Oral   Take 25 mg by mouth 2 (two) times daily.         . insulin glargine (LANTUS) 100 UNIT/ML injection   Subcutaneous   Inject 20 Units into the skin 2 (two) times daily.          . isosorbide dinitrate (ISORDIL) 20 MG tablet   Oral   Take 20 mg by mouth 3 (three) times daily.         Marland Kitchen levothyroxine (SYNTHROID, LEVOTHROID) 50 MCG tablet   Oral   Take 50 mcg by mouth every morning.          . metolazone (ZAROXOLYN) 2.5 MG tablet   Oral   Take 2.5 mg by mouth every other day.         Marland Kitchen omeprazole (PRILOSEC) 20 MG capsule   Oral   Take 40 mg by mouth daily.         . Oxycodone HCl 10 MG TABS   Oral   Take 10 mg by mouth every 4 (four) hours as needed (pain).         . potassium chloride SA (K-DUR,KLOR-CON) 20 MEQ tablet   Oral   Take 40 mEq by mouth 2 (two) times daily.         . simvastatin (ZOCOR) 40 MG tablet   Oral   Take 40 mg by mouth every evening.           BP 129/47  Pulse 58  Temp(Src) 98.3 F (36.8 C) (Oral)  Resp 23  SpO2 99%  Physical Exam  Nursing note and vitals reviewed. Constitutional: He is oriented to person, place, and time. He appears well-developed and well-nourished. No distress.  HENT:  Head: Normocephalic and atraumatic.  Mouth/Throat: Oropharynx is clear and moist.  Eyes: Conjunctivae and EOM are normal. Pupils are equal, round, and reactive to light.  Neck: Normal range  of motion.  Cardiovascular: Regular rhythm.   No murmur heard. Bradycardic  Pulmonary/Chest: Effort normal and breath sounds normal.  Abdominal: Soft. Bowel sounds are normal.  There is no tenderness.  Musculoskeletal: Normal range of motion.  Patient with a left leg amputation. Right lower extremity has a Kopan dressing from the upper part of the tib-fib down to the toes. No evidence of cellulitis or significant swelling above or at the toes. Refill at the toes great toe is 2 seconds. No evidence of significant infection may or no weeping to the dressing. Orthopedics has recommended not removing the dressing.  Neurological: He is alert and oriented to person, place, and time. No cranial nerve deficit. He exhibits normal muscle tone. Coordination normal.  Skin: Skin is warm. No erythema.    ED Course  Procedures (including critical care time)  Labs Reviewed  CBC WITH DIFFERENTIAL - Abnormal; Notable for the following:    WBC 12.7 (*)    RBC 3.79 (*)    Hemoglobin 9.7 (*)    HCT 29.9 (*)    MCH 25.6 (*)    RDW 17.8 (*)    Neutro Abs 9.8 (*)    All other components within normal limits  BASIC METABOLIC PANEL - Abnormal; Notable for the following:    BUN 36 (*)    Creatinine, Ser 2.14 (*)    GFR calc non Af Amer 29 (*)    GFR calc Af Amer 33 (*)    All other components within normal limits   No results found. Results for orders placed during the hospital encounter of 03/23/13  CBC WITH DIFFERENTIAL      Result Value Range   WBC 12.7 (*) 4.0 - 10.5 K/uL   RBC 3.79 (*) 4.22 - 5.81 MIL/uL   Hemoglobin 9.7 (*) 13.0 - 17.0 g/dL   HCT 21.3 (*) 08.6 - 57.8 %   MCV 78.9  78.0 - 100.0 fL   MCH 25.6 (*) 26.0 - 34.0 pg   MCHC 32.4  30.0 - 36.0 g/dL   RDW 46.9 (*) 62.9 - 52.8 %   Platelets 310  150 - 400 K/uL   Neutrophils Relative 77  43 - 77 %   Neutro Abs 9.8 (*) 1.7 - 7.7 K/uL   Lymphocytes Relative 13  12 - 46 %   Lymphs Abs 1.7  0.7 - 4.0 K/uL   Monocytes Relative 6  3 - 12 %    Monocytes Absolute 0.7  0.1 - 1.0 K/uL   Eosinophils Relative 3  0 - 5 %   Eosinophils Absolute 0.4  0.0 - 0.7 K/uL   Basophils Relative 1  0 - 1 %   Basophils Absolute 0.1  0.0 - 0.1 K/uL  BASIC METABOLIC PANEL      Result Value Range   Sodium 144  135 - 145 mEq/L   Potassium 3.9  3.5 - 5.1 mEq/L   Chloride 108  96 - 112 mEq/L   CO2 28  19 - 32 mEq/L   Glucose, Bld 77  70 - 99 mg/dL   BUN 36 (*) 6 - 23 mg/dL   Creatinine, Ser 4.13 (*) 0.50 - 1.35 mg/dL   Calcium 9.0  8.4 - 24.4 mg/dL   GFR calc non Af Amer 29 (*) >90 mL/min   GFR calc Af Amer 33 (*) >90 mL/min     1. Hypotension   2. Bradycardia   3. Open wound of heel, right, subsequent encounter       MDM   Discuss with orthopedics on-call Dorene Grebe. He will have Dr. Magnus Ivan see him in the morning. Discussed with [facility] hospitalist they will mid  to telemetry team 10. Patient stable here in the emergency department earlier had a history of hypotension and bradycardia. Orthopedics recommended not removing the dressing. Patient's labs here without any significant abnormalities are improved compared to his discharge on April 18 other than his creatinine being a little higher than it was then however it has been that high in the past. Patient's right lower extremity since dressing cannot be removed shows no evidence of cellulitis above the dressing no evidence cellulitis to the toes has 2 second cap refill to the toes appears to be no evidence of any significant infection ongoing. Patient's white blood cell count is improved.        Shelda Jakes, MD 03/23/13 2033

## 2013-03-23 NOTE — H&P (Signed)
[facility] History and Physical  [name] NWG:[id number] DOB: [birth year] DOA: 03/23/2013  Referring physician: Dr. Jodelle Gross. PCP: Colette Ribas, MD  Specialists: Dr. Magnus Ivan orthopedic surgeon.  Chief Complaint: Right leg pain.  HPI: [name] is a 78 y.o. male was recently discharged from the hospital 2 days ago after being treated for right leg cellulitis was brought by the family as patient was complaining of increasing leg pain. On the way to the hospital patient was found to be bradycardic and hypotensive and was taken to the nearest hospital in [place] and over there patient was found to be stable and was transferred to the [facility] ER. At the ER patient was found to be having normotensive and heart rate was found to be between 55 and 60. Orthopedic surgeon on call for Dr. Magnus Ivan was consulted by ER physician at this time they have requested hospitalist admission and they'll be seeing patient in consult. Patient has incision and drainage done during last admission and further plans were as outpatient. Patient denies any chest pain or shortness of breath. In addition patient's family also states that they had found it increasingly difficulty to manage patient at home and may need placement as we had discussed with the ER physician.  Review of Systems: As presented in the history of presenting illness, rest negative.  Past Medical History  Diagnosis Date  . HTN (hypertension)   . PVD (peripheral vascular disease)   . COPD (chronic obstructive pulmonary disease)   . Diabetes mellitus     IDDM  . S/P CABG x 6 2003    Dr Cornelius Moras, Myoview low risk 2009  . Gout   . Paroxysmal atrial fibrillation   . Hypothyroidism   . ICD (implantable cardiac defibrillator) discharge 5/11    MDT  . Chronic renal insufficiency, stage III (moderate)   . Chronic systolic CHF (congestive heart failure)     EF 25-30% echo 12/12  . S/P angioplasty, 11/27/12 -Lt below the knee  popliteal artery 11/28/2012   Past Surgical History  Procedure Laterality Date  . Coronary artery bypass graft    . Cardiac catheterization    . Amputation  12/08/2012    Procedure: AMPUTATION BELOW KNEE;  Surgeon: Eldred Manges, MD;  Location: [facility] OR;  Service: Orthopedics;  Laterality: Left;  . Incision and drainage Right 03/14/2013    Procedure: INCISION AND DRAINAGE;  Surgeon: Kathryne Hitch, MD;  Location: [facility] ORS;  Service: Orthopedics;  Laterality: Right;  wash out and wound vac placement  . Application of wound vac Right 03/14/2013    Procedure: APPLICATION OF WOUND VAC;  Surgeon: Kathryne Hitch, MD;  Location: [facility] ORS;  Service: Orthopedics;  Laterality: Right;  heel  . I&d extremity Right 03/17/2013    Procedure: REPEAT IRRIGATION AND DEBRIDEMENT RIGHT FOOT, POSSIBLE SYNTHETIC SKIN GRAFT;  Surgeon: Kathryne Hitch, MD;  Location: [facility] ORS;  Service: Orthopedics;  Laterality: Right;   Social History:  reports that he quit smoking about 9 years ago. His smoking use included Cigarettes. He smoked 0.00 packs per day. He has never used smokeless tobacco. He reports that he does not drink alcohol or use illicit drugs.  Lives at home. where does patient live-- Not sure. Can patient participate in ADLs?  Allergies  Allergen Reactions  . Iodine Solution (Povidone Iodine) Rash    Topical only    Family History  Problem Relation Age of Onset  . Heart disease Father   .  Cancer Brother       Prior to Admission medications   Medication Sig Start Date End Date Taking? Authorizing Provider  allopurinol (ZYLOPRIM) 100 MG tablet Take 150 mg by mouth daily.   Yes Historical Provider, MD  carvedilol (COREG) 12.5 MG tablet Take 12.5 mg by mouth 2 (two) times daily with a meal.   Yes Historical Provider, MD  clindamycin (CLEOCIN) 300 MG capsule Take 1 capsule (300 mg total) by mouth 3 (three) times daily. 03/20/13  Yes Alison Murray, MD  colchicine 0.6 MG tablet Take 1 tablet (0.6  mg total) by mouth 2 (two) times daily. 03/20/13  Yes Alison Murray, MD  furosemide (LASIX) 80 MG tablet Take 80 mg by mouth 2 (two) times daily.   Yes Historical Provider, MD  hydrALAZINE (APRESOLINE) 25 MG tablet Take 25 mg by mouth 3 (three) times daily.   Yes Historical Provider, MD  hydrochlorothiazide (HYDRODIURIL) 50 MG tablet Take 25 mg by mouth 2 (two) times daily.   Yes Historical Provider, MD  insulin glargine (LANTUS) 100 UNIT/ML injection Inject 20 Units into the skin 2 (two) times daily.    Yes Historical Provider, MD  isosorbide dinitrate (ISORDIL) 20 MG tablet Take 20 mg by mouth 3 (three) times daily.   Yes Historical Provider, MD  levothyroxine (SYNTHROID, LEVOTHROID) 50 MCG tablet Take 50 mcg by mouth every morning.    Yes Historical Provider, MD  metolazone (ZAROXOLYN) 2.5 MG tablet Take 2.5 mg by mouth every other day.   Yes Historical Provider, MD  omeprazole (PRILOSEC) 20 MG capsule Take 40 mg by mouth daily.   Yes Historical Provider, MD  Oxycodone HCl 10 MG TABS Take 10 mg by mouth every 4 (four) hours as needed (pain). 03/20/13  Yes Alison Murray, MD  potassium chloride SA (K-DUR,KLOR-CON) 20 MEQ tablet Take 40 mEq by mouth 2 (two) times daily.   Yes Historical Provider, MD  simvastatin (ZOCOR) 40 MG tablet Take 40 mg by mouth every evening.   Yes Historical Provider, MD   Physical Exam: Filed Vitals:   03/23/13 2015 03/23/13 2030 03/23/13 2100 03/23/13 2206  BP: 130/56 123/70 126/55 131/45  Pulse: 62 49 53 63  Temp:    98.6 F (37 C)  TempSrc:    Oral  Resp: 21 20 15 16   Height:    6\' 2"  (1.88 m)  Weight:    76.023 kg (167 lb 9.6 oz)  SpO2: 98% 96% 96% 99%     General:  Well developed and nourished.  Eyes: Anicteric no pallor.  ENT: No discharge from the ears eyes nose and mouth.  Neck: No mass felt.  Cardiovascular: S1-S2 heard.  Respiratory: No rhonchi or crepitations.  Abdomen: Soft nontender bowel sounds present.  Skin: No  rash.  Musculoskeletal: Right leg is in a dressing. The does not look ischemic and is warm to touch.  Psychiatric: Alert awake oriented to name and place.  Neurologic: Moves all extremities.  Labs on Admission:  Basic Metabolic Panel:  Recent Labs Lab 03/17/13 0525 03/18/13 0415 03/19/13 0440 03/23/13 1901  NA 143 143 136 144  K 3.4* 3.6 4.1 3.9  CL 102 104 101 108  CO2 31 31 29 28   GLUCOSE 84 166* 285* 77  BUN 61* 51* 45* 36*  CREATININE 2.16* 1.94* 1.61* 2.14*  CALCIUM 9.0 8.8 8.7 9.0   Liver Function Tests: No results found for this basename: AST, ALT, ALKPHOS, BILITOT, PROT, ALBUMIN,  in the last 168  hours No results found for this basename: LIPASE, AMYLASE,  in the last 168 hours No results found for this basename: AMMONIA,  in the last 168 hours CBC:  Recent Labs Lab 03/17/13 0525 03/18/13 0415 03/19/13 0440 03/20/13 0420 03/23/13 1901  WBC 17.0* 18.7* 15.4* 14.1* 12.7*  NEUTROABS  --   --   --   --  9.8*  HGB 8.4* 8.5* 7.8* 8.7* 9.7*  HCT 27.0* 26.8* 24.5* 27.6* 29.9*  MCV 79.4 80.0 80.3 79.8 78.9  PLT 318 308 313 337 310   Cardiac Enzymes: No results found for this basename: CKTOTAL, CKMB, CKMBINDEX, TROPONINI,  in the last 168 hours  BNP (last 3 results) No results found for this basename: PROBNP,  in the last 8760 hours CBG:  Recent Labs Lab 03/19/13 1643 03/19/13 2116 03/20/13 0738 03/20/13 1144 03/20/13 1647  GLUCAP 178* 144* 183* 154* 138*    Radiological Exams on Admission: No results found.  EKG: Independently reviewed. Sinus rhythm with some of the beats are paced.  Assessment/Plan Active Problems:   Chronic anticoagulation   Acute on chronic renal failure, SCr 2.3-3.3 secondary to dehydration   Cellulitis of right leg   Bradycardia   1. Right leg cellulitis - I have placed patient on mitomycin and Zosyn for now. Further recommendations per orthopedic surgery. 2. Bradycardia and hypotension - presently patient is  normotensive and heart rate is between 55-60. I have decreased patient's Coreg dose. I also withheld metolazone and HCTZ and decrease Lasix dose by half. Closely follow intake and output and metabolic panel. 3. Acute renal failure and chronic kidney disease - see #2. 4. Paroxysmal atrial fibrillation on xarelto - since patient's renal function is mildly worsening I have placed patient on heparin infusion for now. And I don't see xarelto mentioning patient's home medications. 5. CAD status post CABG - denies any chest pain. 6. Gout - continue home medications. 7. Hypothyroidism - continue Synthroid. Check TSH. 8. Diabetes mellitus2 - continue home medications of Lantus with close followup of CBG. 9. Hyperlipidemia - continue home dose.    Code Status: Full code.  Family Communication: None.  Disposition Plan: Admit to inpatient. Patient probably may need placement.    Shannon Balthazar N. [facility] Pager [phone].  If 7PM-7AM, please contact night-coverage [email or web address] Password [email or web address] 03/23/2013, 10:56 PM

## 2013-03-24 DIAGNOSIS — N179 Acute kidney failure, unspecified: Secondary | ICD-10-CM | POA: Diagnosis present

## 2013-03-24 DIAGNOSIS — I959 Hypotension, unspecified: Secondary | ICD-10-CM | POA: Diagnosis present

## 2013-03-24 DIAGNOSIS — E162 Hypoglycemia, unspecified: Secondary | ICD-10-CM | POA: Diagnosis not present

## 2013-03-24 DIAGNOSIS — I25810 Atherosclerosis of coronary artery bypass graft(s) without angina pectoris: Secondary | ICD-10-CM | POA: Diagnosis present

## 2013-03-24 LAB — BASIC METABOLIC PANEL
BUN: 34 mg/dL — ABNORMAL HIGH (ref 6–23)
GFR calc non Af Amer: 28 mL/min — ABNORMAL LOW (ref >90)
Glucose, Bld: 53 mg/dL — ABNORMAL LOW (ref 70–99)
Potassium: 3.8 mEq/L (ref 3.5–5.1)

## 2013-03-24 LAB — GLUCOSE, CAPILLARY
Glucose-Capillary: 52 mg/dL — ABNORMAL LOW (ref 70–99)
Glucose-Capillary: 59 mg/dL — ABNORMAL LOW (ref 70–99)
Glucose-Capillary: 66 mg/dL — ABNORMAL LOW (ref 70–99)
Glucose-Capillary: 148 mg/dL — ABNORMAL HIGH (ref 70–99)
Glucose-Capillary: 144 mg/dL — ABNORMAL HIGH (ref 70–99)

## 2013-03-24 LAB — HEPARIN LEVEL (UNFRACTIONATED): Heparin Unfractionated: 0.31 IU/mL (ref 0.30–0.70)

## 2013-03-24 LAB — CBC
HCT: 28.6 % — ABNORMAL LOW (ref 39.0–52.0)
Hemoglobin: 9.3 g/dL — ABNORMAL LOW (ref 13.0–17.0)
MCH: 25.6 pg — ABNORMAL LOW (ref 26.0–34.0)
MCHC: 32.5 g/dL (ref 30.0–36.0)

## 2013-03-24 MED ORDER — HEPARIN BOLUS VIA INFUSION
4000.0000 [IU] | Freq: Once | INTRAVENOUS | Status: AC
  Administered 2013-03-24: 4000 [IU] via INTRAVENOUS
  Filled 2013-03-24: qty 4000

## 2013-03-24 MED ORDER — INSULIN GLARGINE 100 UNIT/ML ~~LOC~~ SOLN
10.0000 [IU] | Freq: Two times a day (BID) | SUBCUTANEOUS | Status: DC
  Administered 2013-03-24 – 2013-03-26 (×4): 10 [IU] via SUBCUTANEOUS
  Filled 2013-03-24 (×6): qty 0.1

## 2013-03-24 MED ORDER — HEPARIN (PORCINE) IN NACL 100-0.45 UNIT/ML-% IJ SOLN
1350.0000 [IU]/h | INTRAMUSCULAR | Status: DC
  Administered 2013-03-24: 1250 [IU]/h via INTRAVENOUS
  Administered 2013-03-24: 1100 [IU]/h via INTRAVENOUS
  Filled 2013-03-24 (×2): qty 250

## 2013-03-24 MED ORDER — VANCOMYCIN HCL IN DEXTROSE 750-5 MG/150ML-% IV SOLN
750.0000 mg | INTRAVENOUS | Status: DC
  Administered 2013-03-24: 750 mg via INTRAVENOUS
  Filled 2013-03-24: qty 150

## 2013-03-24 NOTE — Progress Notes (Signed)
Patient ID: [name], male   DOB: 12-28-1934, 78 y.o.   MRN: [id number] I will come by his room today to check on his right heel wound and change his dressing.  He was just discharged this past Friday from the Hospitalist Service.  I and therapy had recommended skilled nursing placement, but the patient refused.  He got home and the family could not take care of him and requested re-admission for ST-SNF placement.  Given his medical problems/issues, I did not feel comfortable having him on my service, especially since he has his cardiac issues and since he has been on the medical service each time he has been hospitalized.  Again, I will change his dressing this afternoon.

## 2013-03-24 NOTE — Progress Notes (Signed)
ANTICOAGULATION CONSULT NOTE - Follow-up  Pharmacy Consult for heparin Indication: atrial fibrillation  Allergies  Allergen Reactions  . Iodine Solution (Povidone Iodine) Rash    Topical only   Patient Measurements: Height: 6\' 2"  (188 cm) Weight: 167 lb 9.6 oz (76.023 kg) IBW/kg (Calculated) : 82.2 Heparin Dosing Weight: 76 kg  Vital Signs: Temp: 97.9 F (36.6 C) (04/22 1954) Temp src: Oral (04/22 1954) BP: 127/61 mmHg (04/22 1954) Pulse Rate: 66 (04/22 1954)  Labs:  Recent Labs  03/23/13 1901 03/23/13 2307 03/24/13 0159 03/24/13 0600 03/24/13 0952 03/24/13 1954  HGB 9.7*  --   --  9.3*  --   --   HCT 29.9*  --   --  28.6*  --   --   PLT 310  --   --  316  --   --   APTT  --  36  --   --   --   --   LABPROT  --  16.8*  --   --   --   --   INR  --  1.40  --   --   --   --   HEPARINUNFRC  --  <0.10*  --   --  0.26* 0.31  CREATININE 2.14*  --   --  2.19*  --   --   TROPONINI  --   --  <0.30  --   --   --    Estimated Creatinine Clearance: 31.3 ml/min (by C-G formula based on Cr of 2.19).  Assessment: 78 yo male with a history of afib presented to the ED with cellulitis. He continues on IV heparin and heparin level is now on the low end of desired goal range at 0.31 after rate increase. No bleeding noted, CBC is stable.  Home anticoagulation is unclear. Prior to 4/10 hospitalization - patient reported no blood thinner, but INR 3.19 on admission. Patient was placed (vs restarted?) on Xarelto on 4/16-4/18 and included on discharge summary to continue.   Upon admission, INR 1.4, aPTT 36, and anti-Xa < 0.1. Due to anti-Xa level undetectable, unlikely that patient has recently taken Xarelto.   Goal of Therapy:  Heparin level 0.3-0.7 units/ml Monitor platelets by anticoagulation protocol: Yes   Plan:  1. Increase heparin gtt to 1350units/hr to maintain goal 2. Continue daily heparin level and CBC 3. F/u oral anticoag plans - will need to clarify home  anticoagulation  Nadara Mustard, PharmD., MS Clinical Pharmacist Pager:  [phone] Thank you for allowing pharmacy to be part of this patients care team.  03/24/2013 8:32 PM

## 2013-03-24 NOTE — Significant Event (Signed)
Hypoglycemic Event  CBG: 52  Treatment: 15 GM carbohydrate snack  Symptoms: None  Follow-up CBG: Time:0712 CBG Result: 66  Possible Reasons for Event: Unknown  Comments/MD notified    Mercy Moore  Remember to initiate Hypoglycemia Order Set & complete

## 2013-03-24 NOTE — Progress Notes (Signed)
Patient ID: [name], male   DOB: Aug 30, 1935, 78 y.o.   MRN: [id number] I changed [name] right foot/ankle dressing.  The graft looks good.  The wound is dry.  There is no cellulitis at all and no drainage.  I placed new dressing with adaptic, surgical lube, damp gauze, dry gauze, and coban.  This dressing needs to stay in place completely with no one removing it at all.  He needs to keep pressure off of his heel at all times.  He can put weight on his left prosthetic leg as comfort allows or touch his right forefoot to the ground.  He does not need IV antibiotics from my standpoint all all for his right foot given his exam.  He is awaiting skilled nursing placement.  I need to see him in my office next Monday 4/28 for a dressing change.

## 2013-03-24 NOTE — Progress Notes (Addendum)
Clinical Social Work Department CLINICAL SOCIAL WORK PLACEMENT NOTE 03/24/2013  Patient:  [name], [name]  Account Number:  [id number] Admit date:  03/23/2013  Clinical Social Worker:  Theresia Bough, Theresia Majors  Date/time:  03/24/2013 03:29 AM  Clinical Social Work is seeking post-discharge placement for this patient at the following level of care:   SKILLED NURSING   (*CSW will update this form in Epic as items are completed)   03/24/2013  Patient/family provided with [facility] Department of Clinical Social Work's list of facilities offering this level of care within the geographic area requested by the patient (or if unable, by the patient's family).  03/24/2013  Patient/family informed of their freedom to choose among providers that offer the needed level of care, that participate in Medicare, Medicaid or managed care program needed by the patient, have an available bed and are willing to accept the patient.  03/24/2013  Patient/family informed of [facility]' ownership interest in [facility], as well as of the fact that they are under no obligation to receive care at this facility.  PASARR submitted to EDS on 03/24/2013 PASARR number received from EDS on   FL2 transmitted to all facilities in geographic area requested by pt/family on  03/24/2013 FL2 transmitted to all facilities within larger geographic area on   Patient informed that his/her managed care company has contracts with or will negotiate with  certain facilities, including the following:     Patient/family informed of bed offers received:  03/25/13 Patient chooses bed at [facility]  Physician recommends and patient chooses bed at    Patient to be transferred to [facility] on  03/26/13 Patient to be transferred to facility by ptar  The following physician request were entered in Epic:   Additional Comments: Theresia Bough, MSW, [employer] [phone]

## 2013-03-24 NOTE — Care Management Note (Signed)
    Page 1 of 1   03/24/2013     4:57:53 PM   CARE MANAGEMENT NOTE 03/24/2013  Patient:  [name], [name]   Account Number:  [id number]  Date Initiated:  03/24/2013  Documentation initiated by:  [facility]  Subjective/Objective Assessment:   Readmission for continued cellulitis.  On IV antibitics.     Action/Plan:   Anticipated DC Date:  03/27/2013   Anticipated DC Plan:  SKILLED NURSING FACILITY  In-house referral  Clinical Social Worker      DC Planning Services  CM consult      Choice offered to / List presented to:             Status of service:  In process, will continue to follow Medicare Important Message given?   (If response is "NO", the following Medicare IM given date fields will be blank) Date Medicare IM given:   Date Additional Medicare IM given:    Discharge Disposition:    Per UR Regulation:  Reviewed for med. necessity/level of care/duration of stay  If discussed at Long Length of Stay Meetings, dates discussed:    Comments:  ContactMonte Fantasia Daughter [phone]                Rachael Darby [phone]                 [facility] Brother [phone]  03-24-13 1:45pm Avie Arenas, RNBSN [phone] Family concerned about increasing amount of care  needed at home - SW consult placed for possible placement.  According to physician refused SNF last admission - is considering competency eval.

## 2013-03-24 NOTE — Progress Notes (Addendum)
[facility] PROGRESS NOTE  [name] WUJ:[id number] DOB: 1935-09-28 DOA: 03/23/2013 PCP: Colette Ribas, MD  Brief narrative: [name] is an 78 y.o. male with a past medical history of peripheral vascular disease, status post left BKA, recently admitted 03/12/13-03/20/13 for treatment of cellulitis with suspicion of underlying osteomyelitis (patient refused amputation but consented to I&D), sent home on a 10 day course of Clindamycin, who was readmitted with worsening cellulitis, bradycardia and hypotension.  Of note, the patient was also advised that suggest SNF placement would be in his best interest, but he refused this as well. Dr. Magnus Ivan of orthopedics was consulted on admission.  Assessment/Plan: Principal Problem:   Cellulitis of right leg -On empiric Vancomycin and Zosyn. -Orthopedics on board. Active Problems:   Normocytic anemia -Likely anemia of chronic disease. Monitor hemoglobin. No current indication for transfusion.   PAF (paroxysmal atrial fibrillation) /   Chronic anticoagulation -Xarelto on hold, on IV heparin due to ARF.   Diabetes mellitus type 2, insulin dependent / hypoglycemia -Decrease Lantus to 10 units Q HS and continue insulin sensitive SSI.   CKD (chronic kidney disease) stage 4, GFR 15-29 ml/min -Baseline creatinine 1.6, current creatinine elevated over usual baseline values.   -Hold Lasix, HCTZ and Zaroxolyn.   Dyslipidemia -Continue Statin.   Gout -Continue Allupurinol and colchicine.   Hypothyroidism -Continue Synthroid.   Acute on chronic renal failure -Hold diuretics.   Bradycardia -Hold Coreg with parameters.   Hypotension, unspecified -BP stable.   CAD (coronary artery disease) of artery bypass graft -No chest pain.  Monitor on tele.   Code Status: Full. Family Communication: No family currently at the bedside. Disposition Plan: Home with 24-hour supervision versus skilled nursing home placement.   Medical  Consultants:  Dr. Magnus Ivan, Orthopedics.  Other Consultants:  None.  Anti-infectives:  Vancomycin 03/23/2013--->  Zosyn 03/23/2013--->  HPI/Subjective: [name] states his right lower extremity is "burning ". No nausea or vomiting. Appetite is fair. Telemetry reviewed. A few PVCs noted.  Objective: Filed Vitals:   03/23/13 2030 03/23/13 2100 03/23/13 2206 03/24/13 0500  BP: 123/70 126/55 131/45 119/58  Pulse: 49 53 63 95  Temp:   98.6 F (37 C) 97.4 F (36.3 C)  TempSrc:   Oral Oral  Resp: 20 15 16 17   Height:   6\' 2"  (1.88 m)   Weight:   76.023 kg (167 lb 9.6 oz)   SpO2: 96% 96% 99% 97%   No intake or output data in the 24 hours ending 03/24/13 0711  Exam: Gen:  NAD Cardiovascular:  RRR, No M/R/G Respiratory:  Lungs CTAB Gastrointestinal:  Abdomen soft, NT/ND, + BS Extremities:  Left BKA, right lower extremity wrapped in an [employer]. Dressings not taken down as his orthopedic surgeon plans to change them later today.  Data Reviewed: Basic Metabolic Panel:  Recent Labs Lab 03/18/13 0415 03/19/13 0440 03/23/13 1901 03/24/13 0600  NA 143 136 144 145  K 3.6 4.1 3.9 3.8  CL 104 101 108 108  CO2 31 29 28 27   GLUCOSE 166* 285* 77 53*  BUN 51* 45* 36* 34*  CREATININE 1.94* 1.61* 2.14* 2.19*  CALCIUM 8.8 8.7 9.0 8.9   GFR Estimated Creatinine Clearance: 31.3 ml/min (by C-G formula based on Cr of 2.19). Coagulation profile  Recent Labs Lab 03/23/13 2307  INR 1.40    CBC:  Recent Labs Lab 03/18/13 0415 03/19/13 0440 03/20/13 0420 03/23/13 1901 03/24/13 0600  WBC 18.7* 15.4* 14.1* 12.7* 13.8*  NEUTROABS  --   --   --  9.8*  --   HGB 8.5* 7.8* 8.7* 9.7* 9.3*  HCT 26.8* 24.5* 27.6* 29.9* 28.6*  MCV 80.0 80.3 79.8 78.9 78.8  PLT 308 313 337 310 316   Cardiac Enzymes:  Recent Labs Lab 03/24/13 0159  TROPONINI <0.30   CBG:  Recent Labs Lab 03/20/13 1144 03/20/13 1647 03/23/13 2317 03/24/13 0616 03/24/13 0652  GLUCAP 154* 138*  94 52* 59*   Microbiology Recent Results (from the past 240 hour(s))  SURGICAL PCR SCREEN     Status: Abnormal   Collection Time    03/14/13  7:32 AM      Result Value Range Status   MRSA, PCR NEGATIVE  NEGATIVE Final   Staphylococcus aureus POSITIVE (*) NEGATIVE Final   Comment:            The Xpert SA Assay (FDA     approved for NASAL specimens     in patients over 27 years of age),     is one component of     a comprehensive surveillance     program.  Test performance has     been validated by [employer] for patients greater     than or equal to 19 year old.     It is not intended     to diagnose infection nor to     guide or monitor treatment.  SURGICAL PCR SCREEN     Status: Abnormal   Collection Time    03/17/13 10:40 AM      Result Value Range Status   MRSA, PCR NEGATIVE  NEGATIVE Final   Staphylococcus aureus POSITIVE (*) NEGATIVE Final   Comment:            The Xpert SA Assay (FDA     approved for NASAL specimens     in patients over 54 years of age),     is one component of     a comprehensive surveillance     program.  Test performance has     been validated by [employer] for patients greater     than or equal to 49 year old.     It is not intended     to diagnose infection nor to     guide or monitor treatment.     Procedures and Diagnostic Studies: Dg Foot Complete Right  2013/03/21  *RADIOLOGY REPORT*  Clinical Data: Swollen right foot.  RIGHT FOOT COMPLETE - 3+ VIEW  Comparison: 07/19/2012  Findings: Three views of the right foot were obtained.  Again noted are large erosions involving the distal aspect of the first metatarsal bone.  Again noted are erosions of the second toe DIP joint.  Alignment of the right foot has not changed.  There is no evidence for an acute fracture or dislocation.  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   Original Report Authenticated By: Richarda Overlie, M.D.      Scheduled Meds: . allopurinol  150 mg Oral Daily  . carvedilol  3.125 mg Oral BID WC  . colchicine  0.6 mg Oral BID  . furosemide  40 mg Oral BID  . hydrALAZINE  25 mg Oral TID  . insulin aspart  0-9 Units Subcutaneous TID WC  . insulin glargine  20 Units Subcutaneous BID  . isosorbide dinitrate  20 mg Oral TID  . levothyroxine  50 mcg Oral QAC breakfast  . pantoprazole  40 mg Oral Daily  . piperacillin-tazobactam (ZOSYN)  IV  3.375 g Intravenous Q8H  . potassium chloride SA  40 mEq Oral BID  . simvastatin  40 mg Oral QPM  . sodium chloride  3 mL Intravenous Q12H  . sodium chloride  3 mL Intravenous Q12H  . vancomycin  750 mg Intravenous Q24H   Continuous Infusions: . sodium chloride    . heparin 1,100 Units/hr (03/24/13 0203)    Time spent: 35 minutes   LOS: 1 day   RAMA,CHRISTINA  [facility] Pager [phone].  If 8PM-8AM, please contact night-coverage at [email or web address], password [facility] 03/24/2013, 7:11 AM

## 2013-03-24 NOTE — Progress Notes (Addendum)
ANTICOAGULATION and ANTIBIOTIC CONSULT NOTE - Initial Consult  Pharmacy Consult for heparin, vancomycin, Zosyn Indication: atrial fibrillation, cellulitis  Allergies  Allergen Reactions  . Iodine Solution (Povidone Iodine) Rash    Topical only    Patient Measurements: Height: 6\' 2"  (188 cm) Weight: 167 lb 9.6 oz (76.023 kg) IBW/kg (Calculated) : 82.2 Heparin Dosing Weight: 76 kg  Vital Signs: Temp: 98.6 F (37 C) (04/21 2206) Temp src: Oral (04/21 2206) BP: 131/45 mmHg (04/21 2206) Pulse Rate: 63 (04/21 2206)  Labs:  Recent Labs  03/23/13 1901 03/23/13 2307  HGB 9.7*  --   HCT 29.9*  --   PLT 310  --   APTT  --  36  LABPROT  --  16.8*  INR  --  1.40  CREATININE 2.14*  --     Estimated Creatinine Clearance: 32.1 ml/min (by C-G formula based on Cr of 2.14).   Medical History: Past Medical History  Diagnosis Date  . HTN (hypertension)   . PVD (peripheral vascular disease)   . COPD (chronic obstructive pulmonary disease)   . Diabetes mellitus     IDDM  . S/P CABG x 6 2003    Dr Cornelius Moras, Myoview low risk 2009  . Gout   . Paroxysmal atrial fibrillation   . Hypothyroidism   . ICD (implantable cardiac defibrillator) discharge 5/11    MDT  . Chronic renal insufficiency, stage III (moderate)   . Chronic systolic CHF (congestive heart failure)     EF 25-30% echo 12/12  . S/P angioplasty, 11/27/12 -Lt below the knee popliteal artery 11/28/2012    Medications:  Scheduled:  . allopurinol  150 mg Oral Daily  . carvedilol  3.125 mg Oral BID WC  . colchicine  0.6 mg Oral BID  . furosemide  40 mg Oral BID  . hydrALAZINE  25 mg Oral TID  . insulin aspart  0-9 Units Subcutaneous TID WC  . insulin glargine  20 Units Subcutaneous BID  . isosorbide dinitrate  20 mg Oral TID  . levothyroxine  50 mcg Oral QAC breakfast  . pantoprazole  40 mg Oral Daily  . piperacillin-tazobactam (ZOSYN)  IV  3.375 g Intravenous Q8H  . potassium chloride SA  40 mEq Oral BID  .  simvastatin  40 mg Oral QPM  . sodium chloride  3 mL Intravenous Q12H  . sodium chloride  3 mL Intravenous Q12H  . vancomycin  1,250 mg Intravenous Once    Assessment: 78 yo male presented with right leg cellulitis. Patient with recent hospitalization (4/10 - 4/18). Pharmacy to manage vancomycin and Zosyn for cellulitis. Pharmacy consulted to manage heparin with h/o atrial fibrillation.   Home anticoagulation is unclear. Prior to 4/10 hospitalization - patient reported no blood thinner, but INR 3.19 on admission. Patient was placed (vs restarted?) on Xarelto on 4/16-4/18 and included on discharge summary to continue.   On current admission, INR 1.4, aPTT 36, and anti-Xa < 0.1. Due to anti-Xa level undetectable, unlikely that patient has recently taken Xarelto.   Goal of Therapy:  Heparin level 0.3-0.7 units/ml Monitor platelets by anticoagulation protocol: Yes   Plan:  1. Heparin IV bolus 4000 units x 1, then IV infusion of 1100 units/hr.  2. Heparin level in 8 hours 3. Daily CBC, heparin level  4. Zosyn 3.375gm IV Q8H (4 hr infusion) 5. Vancomycin 1.25gm IV x 1, then 750mg  IV Q24H.  6. Follow-up oral anticoagulation plan.   Emeline Gins 03/24/2013,1:30 AM

## 2013-03-24 NOTE — Progress Notes (Signed)
Pt vomited large amount after attempting to eat lunch; pt denies need for nausea med at this time; pt reports much relief after getting sick; pt cleaned; mouth care performed; will cont. To monitor.

## 2013-03-24 NOTE — Progress Notes (Signed)
ANTICOAGULATION CONSULT NOTE - Follow-up  Pharmacy Consult for heparin Indication: atrial fibrillation  Allergies  Allergen Reactions  . Iodine Solution (Povidone Iodine) Rash    Topical only    Patient Measurements: Height: 6\' 2"  (188 cm) Weight: 167 lb 9.6 oz (76.023 kg) IBW/kg (Calculated) : 82.2 Heparin Dosing Weight: 76 kg  Vital Signs: Temp: 97.4 F (36.3 C) (04/22 0500) Temp src: Oral (04/22 0500) BP: 121/50 mmHg (04/22 1030) Pulse Rate: 64 (04/22 1030)  Labs:  Recent Labs  03/23/13 1901 03/23/13 2307 03/24/13 0159 03/24/13 0600 03/24/13 0952  HGB 9.7*  --   --  9.3*  --   HCT 29.9*  --   --  28.6*  --   PLT 310  --   --  316  --   APTT  --  36  --   --   --   LABPROT  --  16.8*  --   --   --   INR  --  1.40  --   --   --   HEPARINUNFRC  --  <0.10*  --   --  0.26*  CREATININE 2.14*  --   --  2.19*  --   TROPONINI  --   --  <0.30  --   --     Estimated Creatinine Clearance: 31.3 ml/min (by C-G formula based on Cr of 2.19).  Assessment: 78 yo male with a history of afib presented to the ED with cellulitis. He continues on IV heparin but his initial heparin level is low at 0.26. No bleeding noted, CBC is stable.  Home anticoagulation is unclear. Prior to 4/10 hospitalization - patient reported no blood thinner, but INR 3.19 on admission. Patient was placed (vs restarted?) on Xarelto on 4/16-4/18 and included on discharge summary to continue.   Upon admission, INR 1.4, aPTT 36, and anti-Xa < 0.1. Due to anti-Xa level undetectable, unlikely that patient has recently taken Xarelto.   Goal of Therapy:  Heparin level 0.3-0.7 units/ml Monitor platelets by anticoagulation protocol: Yes   Plan:  1. Increase heparin gtt to 1250units/hr 2. Check an 8 hour heparin level 3. Continue daily heparin level and CBC 4. F/u oral anticoag plans - will need to clarify home anticoagulation  Lysle Pearl, PharmD, BCPS Pager # [phone] 03/24/2013 11:01 AM

## 2013-03-24 NOTE — Progress Notes (Signed)
Clinical Social Work Department BRIEF PSYCHOSOCIAL ASSESSMENT 03/24/2013  Patient:  [name], [name]     Account Number:  [id number]     Admit date:  03/23/2013  Clinical Social Worker:  Lourdes Sledge  Date/Time:  03/24/2013 03:12 PM  Referred by:  Care Management  Date Referred:  03/24/2013 Referred for  SNF Placement   Other Referral:   Interview type:  Patient Other interview type:    PSYCHOSOCIAL DATA Living Status:  ALONE Admitted from facility:   Level of care:   Primary support name:  [name] [phone] Primary support relationship to patient:  CHILD, ADULT Degree of support available:   Pt daughter appears to be actively involved in pt care.    CURRENT CONCERNS Current Concerns  Post-Acute Placement   Other Concerns:    SOCIAL WORK ASSESSMENT / PLAN CSW received a referral for SNF placement.    CSW visited pt room and introduced herself and role. CSW explored pt living situation and amount of support available at home. Pt stated he lives alone in [place], [place] however is daughter [name] is involved in his care. CSW informed pt of PT recommendations for SNF placement. Pt stated he would be agreeable to SNF and requested for CSW to speak to his daughter [name] regarding a preferred facility.    CSW spoke with [name] who informed CSW that she would prefer placement for pt at [facility] in [place], however would be agreeable to other facilities in the surrounding area.    CSW to do a SNF search and submit for a pasarr.   Assessment/plan status:  Psychosocial Support/Ongoing Assessment of Needs Other assessment/ plan:   Information/referral to community resources:   CSW to provde a SNF list to pt.    PATIENT'S/FAMILY'S RESPONSE TO PLAN OF CARE: Pt laying in bed alert and oriented. Pt stated he is agreeable to SNF placement for ST rehab. No concerns at this time.       Theresia Bough, MSW, Theresia Majors [phone]

## 2013-03-25 DIAGNOSIS — K5900 Constipation, unspecified: Secondary | ICD-10-CM | POA: Diagnosis present

## 2013-03-25 LAB — BASIC METABOLIC PANEL
CO2: 28 mEq/L (ref 19–32)
Chloride: 108 mEq/L (ref 96–112)
Creatinine, Ser: 2.15 mg/dL — ABNORMAL HIGH (ref 0.50–1.35)
Glucose, Bld: 85 mg/dL (ref 70–99)

## 2013-03-25 LAB — CBC
HCT: 28.2 % — ABNORMAL LOW (ref 39.0–52.0)
Hemoglobin: 9 g/dL — ABNORMAL LOW (ref 13.0–17.0)
MCV: 78.6 fL (ref 78.0–100.0)
Platelets: 284 10*3/uL (ref 150–400)
RBC: 3.59 MIL/uL — ABNORMAL LOW (ref 4.22–5.81)
WBC: 12.8 10*3/uL — ABNORMAL HIGH (ref 4.0–10.5)

## 2013-03-25 LAB — GLUCOSE, CAPILLARY: Glucose-Capillary: 76 mg/dL (ref 70–99)

## 2013-03-25 MED ORDER — RIVAROXABAN 20 MG PO TABS
20.0000 mg | ORAL_TABLET | Freq: Every day | ORAL | Status: DC
  Filled 2013-03-25: qty 1

## 2013-03-25 MED ORDER — BISACODYL 10 MG RE SUPP
10.0000 mg | Freq: Once | RECTAL | Status: AC
  Administered 2013-03-25: 10 mg via RECTAL
  Filled 2013-03-25: qty 1

## 2013-03-25 MED ORDER — RIVAROXABAN 15 MG PO TABS
15.0000 mg | ORAL_TABLET | Freq: Every day | ORAL | Status: DC
  Administered 2013-03-25 – 2013-03-26 (×2): 15 mg via ORAL
  Filled 2013-03-25 (×2): qty 1

## 2013-03-25 NOTE — Progress Notes (Addendum)
[facility] PROGRESS NOTE  BRAELON SPRUNG WJX:[id number] DOB: [birth year] DOA: 03/23/2013 PCP: Colette Ribas, MD  Brief narrative: [name] is an 78 y.o. male with a past medical history of peripheral vascular disease, status post left BKA, recently admitted 03/12/13-03/20/13 for treatment of cellulitis with suspicion of underlying osteomyelitis (patient refused amputation but consented to I&D with wound graft), sent home on a 10 day course of Clindamycin, who was readmitted with worsening cellulitis, bradycardia and hypotension.  Of note, the patient was also advised that suggest SNF placement would be in his best interest, but he refused this as well. Dr. Magnus Ivan of orthopedics was consulted on admission.  Assessment/Plan: Principal Problem:   Cellulitis of right leg -On empiric Vancomycin and Zosyn. -Orthopedics on board.  Dressings changed on 03/24/2013. Graft looked good with a dry wound with no evidence of cellulitis or drainage. -Dressing to remain in place per Dr. Eliberto Ivory recommendations. -Discontinue empiric antibiotics and follow. -Will need to be seen in Dr. Eliberto Ivory office 03/30/13 for next dressing change. Active Problems:   Ischemic cardiomyopathy, EF 25%, chronic systolic CHF, ICD in place, NSVT -Clinically well compensated. -Runs of NSVT noted on tele.   Constipation -Will give a dulcolax suppository today.   Normocytic anemia -Likely anemia of chronic disease. Monitor hemoglobin. No current indication for transfusion.   PAF (paroxysmal atrial fibrillation) /   Chronic anticoagulation -Xarelto on hold, on IV heparin due to ARF. -Baseline creatinine is around 1.6. We'll transition back to oral Xarelto in anticipation of discharge to a SNF soon.   Diabetes mellitus type 2, insulin dependent / hypoglycemia -Continue Lantus 10 units Q HS and continue insulin sensitive SSI. -CBGs 66-154.   CKD (chronic kidney disease) stage 4, GFR 15-29 ml/min -Baseline  creatinine 1.6, current creatinine elevated over usual baseline values.   -Continue to hold Lasix, HCTZ and Zaroxolyn.   Dyslipidemia -Continue Statin.   Gout -Continue Allupurinol and colchicine.   Hypothyroidism -Continue Synthroid.   Acute on chronic renal failure -Hold diuretics.   Bradycardia -Hold Coreg with parameters.   Hypotension, unspecified -BP stable.   CAD (coronary artery disease) of artery bypass graft -No chest pain.  Monitor on tele.   Code Status: Full. Family Communication: No family currently at the bedside. Disposition Plan: Home with 24-hour supervision versus skilled nursing home placement.   Medical Consultants:  Dr. Magnus Ivan, Orthopedics.  Other Consultants:  None.  Anti-infectives:  Vancomycin 03/23/2013---> 03/25/2013  Zosyn 03/23/2013---> 03/25/2013  HPI/Subjective: [name] feels poorly today. He had an episode of nausea and vomiting this morning. His bowels have not moved since 03/21/2013. Denies dyspnea and chest pain.  Objective: Filed Vitals:   03/24/13 1030 03/24/13 1534 03/24/13 1954 03/25/13 0417  BP: 121/50 117/48 127/61 132/56  Pulse: 64 63 66 67  Temp:  98.3 F (36.8 C) 97.9 F (36.6 C) 98.4 F (36.9 C)  TempSrc:  Oral Oral Oral  Resp:  18 18 18   Height:      Weight:    77.9 kg (171 lb 11.8 oz)  SpO2:  98% 98% 99%    Intake/Output Summary (Last 24 hours) at 03/25/13 2130 Last data filed at 03/25/13 0600  Gross per 24 hour  Intake 1407.98 ml  Output    930 ml  Net 477.98 ml    Exam: Gen:  NAD Cardiovascular:  RRR, No M/R/G Respiratory:  Lungs CTAB Gastrointestinal:  Abdomen soft, NT/ND, + BS Extremities:  Left BKA, right lower extremity wrapped in an  Unna boot. Dressings not taken down as his orthopedic surgeon plans to change them later today.  Data Reviewed: Basic Metabolic Panel:  Recent Labs Lab 03/19/13 0440 03/23/13 1901 03/24/13 0600 03/25/13 0625  NA 136 144 145 143  K 4.1 3.9 3.8 3.7   CL 101 108 108 108  CO2 29 28 27 28   GLUCOSE 285* 77 53* 85  BUN 45* 36* 34* 31*  CREATININE 1.61* 2.14* 2.19* 2.15*  CALCIUM 8.7 9.0 8.9 8.8   GFR Estimated Creatinine Clearance: 32.7 ml/min (by C-G formula based on Cr of 2.15). Coagulation profile  Recent Labs Lab 03/23/13 2307  INR 1.40    CBC:  Recent Labs Lab 03/19/13 0440 03/20/13 0420 03/23/13 1901 03/24/13 0600 03/25/13 0625  WBC 15.4* 14.1* 12.7* 13.8* 12.8*  NEUTROABS  --   --  9.8*  --   --   HGB 7.8* 8.7* 9.7* 9.3* 9.0*  HCT 24.5* 27.6* 29.9* 28.6* 28.2*  MCV 80.3 79.8 78.9 78.8 78.6  PLT 313 337 310 316 284   Cardiac Enzymes:  Recent Labs Lab 03/24/13 0159  TROPONINI <0.30   CBG:  Recent Labs Lab 03/24/13 0710 03/24/13 1124 03/24/13 1623 03/24/13 2108 03/25/13 0623  GLUCAP 66* 148* 154* 144* 84   Microbiology Recent Results (from the past 240 hour(s))  SURGICAL PCR SCREEN     Status: Abnormal   Collection Time    03/17/13 10:40 AM      Result Value Range Status   MRSA, PCR NEGATIVE  NEGATIVE Final   Staphylococcus aureus POSITIVE (*) NEGATIVE Final   Comment:            The Xpert SA Assay (FDA     approved for NASAL specimens     in patients over 84 years of age),     is one component of     a comprehensive surveillance     program.  Test performance has     been validated by [employer] for patients greater     than or equal to 72 year old.     It is not intended     to diagnose infection nor to     guide or monitor treatment.     Procedures and Diagnostic Studies: Dg Foot Complete Right  04-Apr-2013  *RADIOLOGY REPORT*  Clinical Data: Swollen right foot.  RIGHT FOOT COMPLETE - 3+ VIEW  Comparison: 07/19/2012  Findings: Three views of the right foot were obtained.  Again noted are large erosions involving the distal aspect of the first metatarsal bone.  Again noted are erosions of the second toe DIP joint.  Alignment of the right foot has not changed.  There is no  evidence for an acute fracture or dislocation.  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   Original Report Authenticated By: Richarda Overlie, M.D.     Scheduled Meds: . allopurinol  150 mg Oral Daily  . bisacodyl  10 mg Rectal Once  . carvedilol  3.125 mg Oral BID WC  . colchicine  0.6 mg Oral BID  . hydrALAZINE  25 mg Oral TID  . insulin aspart  0-9 Units Subcutaneous TID WC  . insulin glargine  10 Units Subcutaneous BID  . isosorbide dinitrate  20 mg Oral TID  . levothyroxine  50 mcg Oral QAC breakfast  . pantoprazole  40 mg Oral Daily  . simvastatin  40 mg Oral QPM  . sodium  chloride  3 mL Intravenous Q12H  . sodium chloride  3 mL Intravenous Q12H   Continuous Infusions: . sodium chloride    . heparin 1,350 Units/hr (03/24/13 2039)    Time spent: 25 minutes   LOS: 2 days   RAMA,CHRISTINA  [facility] Pager [phone].  If 8PM-8AM, please contact night-coverage at [email or web address], password [facility] 03/25/2013, 7:22 AM

## 2013-03-25 NOTE — Progress Notes (Signed)
Clinical Child psychotherapist (CSW) left a message to inform pt daughter/granddaughter that [facility] and [facility] is able to provide placement at discharge. CSW to remain following and assist with dc when pt is medically ready.  Theresia Bough, MSW, Theresia Majors [phone]

## 2013-03-25 NOTE — Progress Notes (Signed)
Patient has had multiple runs of V-tachy. Pt. Assessed. Vital signs stable.Dr. Darnelle Catalan notified. She said the patient has an ICD and there is no need to call unless the runs are sustained. Cashmere Harmes, Melida Quitter, RN

## 2013-03-25 NOTE — Progress Notes (Signed)
Plan to transition back to oral xarelto for afib.  Will dc heparin and start xarelto 15 mg po daily.

## 2013-03-26 LAB — BASIC METABOLIC PANEL
CO2: 28 mEq/L (ref 19–32)
Chloride: 108 mEq/L (ref 96–112)
Sodium: 144 mEq/L (ref 135–145)

## 2013-03-26 LAB — GLUCOSE, CAPILLARY: Glucose-Capillary: 87 mg/dL (ref 70–99)

## 2013-03-26 MED ORDER — INSULIN GLARGINE 100 UNIT/ML ~~LOC~~ SOLN: 10.0000 [IU] | Freq: Two times a day (BID) | SUBCUTANEOUS | Status: AC

## 2013-03-26 MED ORDER — RIVAROXABAN 15 MG PO TABS: 15.0000 mg | ORAL_TABLET | Freq: Every day | ORAL | Status: AC

## 2013-03-26 MED ORDER — INSULIN ASPART 100 UNIT/ML ~~LOC~~ SOLN: 0.0000 [IU] | Freq: Three times a day (TID) | SUBCUTANEOUS | Status: AC

## 2013-03-26 MED ORDER — CARVEDILOL 3.125 MG PO TABS: 3.1250 mg | ORAL_TABLET | Freq: Two times a day (BID) | ORAL | Status: AC

## 2013-03-26 MED ORDER — ONDANSETRON HCL 4 MG PO TABS: 4.0000 mg | ORAL_TABLET | Freq: Four times a day (QID) | ORAL | Status: AC | PRN

## 2013-03-26 MED ORDER — OXYCODONE HCL 10 MG PO TABS: 10.0000 mg | ORAL_TABLET | ORAL | Status: AC | PRN

## 2013-03-26 NOTE — Progress Notes (Signed)
Clinical Child psychotherapist (CSW) informed by [facility] that authorization has been given by pt insurance. CSW prepared and placed pt dc packet in the [facility] and facility contact number given to RN to give report. CSW has notified pt, pt daughter, and grand daughter who all remain agreeable to SNF placement. CSW has contacted [facility] for a 16:00 transport to [facility] and [address]. No additional needs, CSW signing off. Theresia Bough, MSW, Theresia Majors [phone]

## 2013-03-26 NOTE — Progress Notes (Signed)
Clinical Child psychotherapist (CSW) notified by facility that pt insurance is requesting a PT evaluation to authorize SNF placement. CSW informed MD. CSW to remain following and assist with dc today.  Theresia Bough, MSW, Theresia Majors [phone]

## 2013-03-26 NOTE — Evaluation (Signed)
Physical Therapy Evaluation Patient Details Name: [name] MRN: [id number] DOB: Jun 24, 1935 Today's Date: 03/26/2013 Time: 4098-1191 PT Time Calculation (min): 18 min  PT Assessment / Plan / Recommendation Clinical Impression  Pt is a 78 y.o. male s/p InD RLE with hx of L BKA. Patient demonstrates difficulty with mobility at this time. Rec SNF upon discharge to improve mobility and transfers.    PT Assessment  Patient needs continued PT services    Follow Up Recommendations  SNF;Supervision/Assistance - 24 hour          Equipment Recommendations  None recommended by PT       Frequency Min 3X/week    Precautions / Restrictions Precautions Precaution Comments: L BKA Restrictions Weight Bearing Restrictions: Yes RLE Weight Bearing: Non weight bearing Other Position/Activity Restrictions: no WB on heel   Pertinent Vitals/Pain 5/10      Mobility  Bed Mobility Bed Mobility: Supine to Sit Supine to Sit: HOB elevated;With rails;2: Max assist Details for Bed Mobility Assistance: increased time, assist for R LE and trunk upright Transfers Transfers: Counselling psychologist Transfer: 2: Max assist;To level surface Details for Transfer Assistance: Assist for scooting and VCs for hand placement (used check to assist scoot Ambulation/Gait Ambulation/Gait Assistance: Not tested (comment)    Exercises     PT Diagnosis: Generalized weakness;Other (comment) (decreased transfer, unable to ambulate)  PT Problem List: Decreased strength;Decreased activity tolerance;Decreased mobility;Decreased knowledge of use of DME;Decreased knowledge of precautions;Pain PT Treatment Interventions: DME instruction;Functional mobility training;Patient/family education;Therapeutic exercise;Therapeutic activities;Wheelchair mobility training;Balance training   PT Goals Acute Rehab PT Goals PT Goal Formulation: With patient Time For Goal Achievement: 04/03/13 Potential  to Achieve Goals: Good Pt will go Supine/Side to Sit: with supervision PT Goal: Supine/Side to Sit - Progress: Goal set today Pt will Transfer Bed to Chair/Chair to Bed: with supervision PT Transfer Goal: Bed to Chair/Chair to Bed - Progress: Goal set today Pt will Perform Home Exercise Program: with supervision, verbal cues required/provided PT Goal: Perform Home Exercise Program - Progress: Goal set today Pt will Propel Wheelchair: > 150 feet;with supervision PT Goal: Propel Wheelchair - Progress: Goal set today  Visit Information  Last PT Received On: 03/26/13 Assistance Needed: +2 (recommended)    Subjective Data  Subjective: I'll do what you tell me to do.   Prior Functioning  Home Living Available Help at Discharge: Skilled Nursing Facility Type of Home: Skilled Nursing Facility Additional Comments: Pt was at rehab prior to admission and plans to return to a rehab. Prior Function Level of Independence: Needs assistance Communication Communication: HOH    Cognition  Cognition Arousal/Alertness: Awake/alert Behavior During Therapy: WFL for tasks assessed/performed Overall Cognitive Status: Within Functional Limits for tasks assessed    Extremity/Trunk Assessment Right Upper Extremity Assessment RUE ROM/Strength/Tone: WFL for tasks assessed (generalized weakness) Left Upper Extremity Assessment LUE ROM/Strength/Tone: WFL for tasks assessed (generalized weakness) Right Lower Extremity Assessment RLE ROM/Strength/Tone: Unable to fully assess;Due to pain Left Lower Extremity Assessment LLE ROM/Strength/Tone: WFL for tasks assessed (BKA)      End of Session PT - End of Session Activity Tolerance: Patient limited by pain;Patient limited by fatigue Patient left: in chair;with call bell/phone within reach Nurse Communication: Mobility status;Need for lift equipment  GP     Fabio Asa 03/26/2013, 11:43 AM Charlotte Crumb, PT DPT  [phone]

## 2013-03-26 NOTE — Discharge Summary (Signed)
Physician Discharge Summary  [name]:[id number] DOB: [birth year] DOA: 03/23/2013  PCP: Colette Ribas, MD  Admit date: 03/23/2013 Discharge date: 03/26/2013  Recommendations for Outpatient Follow-up:  1. F/U with Dr. Doneen Poisson on 03/30/13 for a dressing change. 2. No weight bearing right foot, can put weight on left prosthetic leg as tolerated. 3. Monitor renal function closely with resumption of diuretics post discharge.  Discharge Diagnoses:  Principal Problem:    Cellulitis of right leg Active Problems:    ICD in place, MDT May 2011    PAF (paroxysmal atrial fibrillation)    Diabetes mellitus type 2, insulin dependent    CKD (chronic kidney disease) stage 4, GFR 15-29 ml/min    Anemia due to chronic illness    Ischemic cardiomyopathy, EF 25% 2D 12/12    Dyslipidemia    Chronic anticoagulation    Non-sustained ventricular tachycardia    Gout    Hypothyroidism    Acute on chronic renal failure, SCr 2.3-3.3 secondary to dehydration    Bradycardia    Hypotension, unspecified    ARF (acute renal failure)    CAD (coronary artery disease) of artery bypass graft    Hypoglycemia    Unspecified constipation    Discharge Condition: Stable.  Diet recommendation: Low sodium, heart healthy, carb modified.  History of present illness:  [name] is an 78 y.o. male with a past medical history of peripheral vascular disease, status post left BKA, recently admitted 03/12/13-03/20/13 for treatment of cellulitis with suspicion of underlying osteomyelitis (patient refused amputation but consented to I&D), sent home on a 10 day course of Clindamycin, who was readmitted with worsening cellulitis, bradycardia and hypotension. Of note, the patient was also advised that suggest SNF placement would be in his best interest, but he refused this as well. Dr. Magnus Ivan of orthopedics was consulted on admission.   Hospital Course by problem:  Principal  Problem:  Principal Problem:  Cellulitis of right leg  -Initially placed on empiric Vancomycin and Zosyn. Empiric antibiotics discontinued 03/25/2013.  -Orthopedics followed the patient in the hospital. Dressings changed on 03/24/2013. Graft looked good with a dry wound with no evidence of cellulitis or drainage.  -Dressing to remain in place per Dr. Eliberto Ivory recommendations.  -Will need to be seen in Dr. Eliberto Ivory office 03/30/13 for next dressing change.  Active Problems:  Ischemic cardiomyopathy, EF 25%, chronic systolic CHF, ICD in place, NSVT  -Clinically well compensated.  -Runs of NSVT noted on tele. Has AICD in place. Constipation  -Given a dulcolax suppository 03/25/2013. Intake and output records indicate that his bowels moved.  Normocytic anemia  -Likely anemia of chronic disease. Monitor hemoglobin. No current indication for transfusion.  PAF (paroxysmal atrial fibrillation) / Chronic anticoagulation  -Initially placed on IV heparin due to ARF. Xarelto resumed 03/25/2013.  Diabetes mellitus type 2, insulin dependent / hypoglycemia  -Continue Lantus 10 units Q HS and continue insulin sensitive SSI.  -CBGs 76-102.  CKD (chronic kidney disease) stage 4, GFR 15-29 ml/min  -Baseline creatinine 1.6, current creatinine elevated over usual baseline values.  -OK to resume diuretics at discharge.  Dyslipidemia  -Continue Statin.  Gout  -Continue Allupurinol and colchicine.  Hypothyroidism  -Continue Synthroid.  Acute on chronic renal failure  -Diuretics held while in hospital, OK to resume post discharge.  Bradycardia  -Hold Coreg with parameters.  Hypotension, unspecified  -BP stable.  CAD (coronary artery disease) of artery bypass graft  -No chest pain. Monitor on tele.  Procedures:  None.  Consultations:  Dr. Magnus Ivan, Orthopedics.   Discharge Exam: Filed Vitals:   03/26/13 1334  BP: 129/76  Pulse: 64  Temp: 97.2 F (36.2 C)  Resp: 19   Filed Vitals:    03/25/13 1404 03/25/13 2006 03/26/13 0500 03/26/13 1334  BP: 129/74 145/69 144/59 129/76  Pulse: 60 73 91 64  Temp:  98.8 F (37.1 C) 98.4 F (36.9 C) 97.2 F (36.2 C)  TempSrc:  Oral Oral Oral  Resp:    19  Height:      Weight:   77.2 kg (170 lb 3.1 oz)   SpO2:  100% 99% 100%   Cardiovascular: RRR, No M/R/G  Respiratory: Lungs CTAB  Gastrointestinal: Abdomen soft, NT/ND, + BS  Extremities: Left BKA, right lower extremity wrapped in an Unna boot.  Discharge Instructions  Discharge Orders   Future Orders Complete By Expires     Call MD for:  persistant nausea and vomiting  As directed     Call MD for:  redness, tenderness, or signs of infection (pain, swelling, redness, odor or green/yellow discharge around incision site)  As directed     Call MD for:  severe uncontrolled pain  As directed     Call MD for:  temperature >100.4  As directed     Diet - low sodium heart healthy  As directed     Diet Carb Modified  As directed     Discharge instructions  As directed     Comments:      No weight bearing right foot.  Dressing to remain in place until he follows up with Dr. Magnus Ivan on 03/30/13.    Increase activity slowly  As directed         Medication List    STOP taking these medications       clindamycin 300 MG capsule  Commonly known as:  CLEOCIN     hydrochlorothiazide 50 MG tablet  Commonly known as:  HYDRODIURIL      TAKE these medications       allopurinol 100 MG tablet  Commonly known as:  ZYLOPRIM  Take 150 mg by mouth daily.     carvedilol 3.125 MG tablet  Commonly known as:  COREG  Take 1 tablet (3.125 mg total) by mouth 2 (two) times daily with a meal.     colchicine 0.6 MG tablet  Take 1 tablet (0.6 mg total) by mouth 2 (two) times daily.     furosemide 80 MG tablet  Commonly known as:  LASIX  Take 80 mg by mouth 2 (two) times daily.     hydrALAZINE 25 MG tablet  Commonly known as:  APRESOLINE  Take 25 mg by mouth 3 (three) times daily.      insulin aspart 100 UNIT/ML injection  Commonly known as:  novoLOG  Inject 0-9 Units into the skin 3 (three) times daily with meals.     insulin glargine 100 UNIT/ML injection  Commonly known as:  LANTUS  Inject 0.1 mLs (10 Units total) into the skin 2 (two) times daily.     isosorbide dinitrate 20 MG tablet  Commonly known as:  ISORDIL  Take 20 mg by mouth 3 (three) times daily.     levothyroxine 50 MCG tablet  Commonly known as:  SYNTHROID, LEVOTHROID  Take 50 mcg by mouth every morning.     metolazone 2.5 MG tablet  Commonly known as:  ZAROXOLYN  Take 2.5 mg by mouth every other day.  omeprazole 20 MG capsule  Commonly known as:  PRILOSEC  Take 40 mg by mouth daily.     ondansetron 4 MG tablet  Commonly known as:  ZOFRAN  Take 1 tablet (4 mg total) by mouth every 6 (six) hours as needed for nausea.     Oxycodone HCl 10 MG Tabs  Take 1 tablet (10 mg total) by mouth every 4 (four) hours as needed (pain).     potassium chloride SA 20 MEQ tablet  Commonly known as:  K-DUR,KLOR-CON  Take 40 mEq by mouth 2 (two) times daily.     Rivaroxaban 15 MG Tabs tablet  Commonly known as:  XARELTO  Take 1 tablet (15 mg total) by mouth daily with lunch.     simvastatin 40 MG tablet  Commonly known as:  ZOCOR  Take 40 mg by mouth every evening.           Follow-up Information   Follow up with Kathryne Hitch, MD. Schedule an appointment as soon as possible for a visit on 03/30/2013. (F/U 03/30/13 for a dressing change.)    Contact information:   [address] [place] [address] [phone]       Follow up with Colette Ribas, MD. Schedule an appointment as soon as possible for a visit in 1 week. [facility] follow up.)    Contact information:   [address] [place] [address] [phone]        The results of significant diagnostics from this hospitalization (including imaging, microbiology, ancillary and laboratory)  are listed below for reference.    Significant Diagnostic Studies: Dg Foot Complete Right  March 20, 2013  *RADIOLOGY REPORT*  Clinical Data: Swollen right foot.  RIGHT FOOT COMPLETE - 3+ VIEW  Comparison: 07/19/2012  Findings: Three views of the right foot were obtained.  Again noted are large erosions involving the distal aspect of the first metatarsal bone.  Again noted are erosions of the second toe DIP joint.  Alignment of the right foot has not changed.  There is no evidence for an acute fracture or dislocation.  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   Original Report Authenticated By: Richarda Overlie, M.D.     Labs:  Basic Metabolic Panel:  Recent Labs Lab 03/23/13 1901 03/24/13 0600 03/25/13 0625 03/26/13 0500  NA 144 145 143 144  K 3.9 3.8 3.7 3.8  CL 108 108 108 108  CO2 28 27 28 28   GLUCOSE 77 53* 85 101*  BUN 36* 34* 31* 26*  CREATININE 2.14* 2.19* 2.15* 2.02*  CALCIUM 9.0 8.9 8.8 8.9   GFR Estimated Creatinine Clearance: 34.5 ml/min (by C-G formula based on Cr of 2.02).  Coagulation profile  Recent Labs Lab 03/23/13 2307  INR 1.40    CBC:  Recent Labs Lab 03/20/13 0420 03/23/13 1901 03/24/13 0600 03/25/13 0625  WBC 14.1* 12.7* 13.8* 12.8*  NEUTROABS  --  9.8*  --   --   HGB 8.7* 9.7* 9.3* 9.0*  HCT 27.6* 29.9* 28.6* 28.2*  MCV 79.8 78.9 78.8 78.6  PLT 337 310 316 284   Cardiac Enzymes:  Recent Labs Lab 03/24/13 0159  TROPONINI <0.30   CBG:  Recent Labs Lab 03/25/13 1119 03/25/13 1629 03/25/13 2131 03/26/13 0614 03/26/13 1114  GLUCAP 102* 92 76 87 108*   Thyroid function studies  Recent Labs  03/24/13 0600  TSH 2.570   Anemia work up No results  found for this basename: VITAMINB12, FOLATE, FERRITIN, TIBC, IRON, RETICCTPCT,  in the last 72 hours Microbiology Recent Results (from the past 240 hour(s))  SURGICAL PCR SCREEN     Status: Abnormal   Collection Time    03/17/13 10:40 AM       Result Value Range Status   MRSA, PCR NEGATIVE  NEGATIVE Final   Staphylococcus aureus POSITIVE (*) NEGATIVE Final   Comment:            The Xpert SA Assay (FDA     approved for NASAL specimens     in patients over 54 years of age),     is one component of     a comprehensive surveillance     program.  Test performance has     been validated by [employer] for patients greater     than or equal to 74 year old.     It is not intended     to diagnose infection nor to     guide or monitor treatment.    Time coordinating discharge: 35 minutes.  Signed:  Abriana Saltos  Pager [phone] [facility] 03/26/2013, 2:58 PM

## 2013-03-26 NOTE — Progress Notes (Signed)
[facility] PROGRESS NOTE  [name] JYN:[id number] DOB: 1935-07-17 DOA: 03/23/2013 PCP: Colette Ribas, MD  Brief narrative: [name] is an 78 y.o. male with a past medical history of peripheral vascular disease, status post left BKA, recently admitted 03/12/13-03/20/13 for treatment of cellulitis with suspicion of underlying osteomyelitis (patient refused amputation but consented to I&D with wound graft), sent home on a 10 day course of Clindamycin, who was readmitted with worsening cellulitis, bradycardia and hypotension.  Of note, the patient was also advised that suggest SNF placement would be in his best interest, but he refused this as well. Dr. Magnus Ivan of orthopedics was consulted on admission.  Assessment/Plan: Principal Problem:   Cellulitis of right leg -Initially placed on empiric Vancomycin and Zosyn. Empiric antibiotics discontinued 03/25/2013. -Orthopedics on board.  Dressings changed on 03/24/2013. Graft looked good with a dry wound with no evidence of cellulitis or drainage. -Dressing to remain in place per Dr. Eliberto Ivory recommendations. -Will need to be seen in Dr. Eliberto Ivory office 03/30/13 for next dressing change. Active Problems:   Ischemic cardiomyopathy, EF 25%, chronic systolic CHF, ICD in place, NSVT -Clinically well compensated. -Runs of NSVT noted on tele.   Constipation -Given a dulcolax suppository 03/25/2013. Intake and output records indicate that his bowels moved.   Normocytic anemia -Likely anemia of chronic disease. Monitor hemoglobin. No current indication for transfusion.   PAF (paroxysmal atrial fibrillation) /   Chronic anticoagulation -Initially placed on IV heparin due to ARF. Xarelto resumed 03/25/2013.   Diabetes mellitus type 2, insulin dependent / hypoglycemia -Continue Lantus 10 units Q HS and continue insulin sensitive SSI. -CBGs  76-102.   CKD (chronic kidney disease) stage 4, GFR 15-29 ml/min -Baseline creatinine 1.6,  current creatinine elevated over usual baseline values.   -Continue to hold Lasix, HCTZ and Zaroxolyn.   Dyslipidemia -Continue Statin.   Gout -Continue Allupurinol and colchicine.   Hypothyroidism -Continue Synthroid.   Acute on chronic renal failure -Hold diuretics.   Bradycardia -Hold Coreg with parameters.   Hypotension, unspecified -BP stable.   CAD (coronary artery disease) of artery bypass graft -No chest pain.  Monitor on tele.   Code Status: Full. Family Communication: No family currently at the bedside.  Granddaughter updated by telephone 03/26/13. Disposition Plan: Home with 24-hour supervision versus skilled nursing home placement.   Medical Consultants:  Dr. Magnus Ivan, Orthopedics.  Other Consultants:  None.  Anti-infectives:  Vancomycin 03/23/2013---> 03/25/2013  Zosyn 03/23/2013---> 03/25/2013  HPI/Subjective: [name] feels better today. His bowels moved yesterday after the suppository. No complaints of uncontrolled pain. No dyspnea or chest pain. No further nausea/vomiting.  Objective: Filed Vitals:   03/25/13 0843 03/25/13 1404 03/25/13 2006 03/26/13 0500  BP: 124/61 129/74 145/69 144/59  Pulse: 65 60 73 91  Temp:   98.8 F (37.1 C) 98.4 F (36.9 C)  TempSrc:   Oral Oral  Resp:      Height:      Weight:    77.2 kg (170 lb 3.1 oz)  SpO2:   100% 99%    Intake/Output Summary (Last 24 hours) at 03/26/13 0724 Last data filed at 03/25/13 1630  Gross per 24 hour  Intake    480 ml  Output    351 ml  Net    129 ml    Exam: Gen:  NAD Cardiovascular:  RRR, No M/R/G Respiratory:  Lungs CTAB Gastrointestinal:  Abdomen soft, NT/ND, + BS Extremities:  Left BKA, right lower extremity wrapped in an  Unna boot.   Data Reviewed: Basic Metabolic Panel:  Recent Labs Lab 03/23/13 1901 03/24/13 0600 03/25/13 0625 03/26/13 0500  NA 144 145 143 144  K 3.9 3.8 3.7 3.8  CL 108 108 108 108  CO2 28 27 28 28   GLUCOSE 77 53* 85 101*  BUN 36* 34*  31* 26*  CREATININE 2.14* 2.19* 2.15* 2.02*  CALCIUM 9.0 8.9 8.8 8.9   GFR Estimated Creatinine Clearance: 34.5 ml/min (by C-G formula based on Cr of 2.02). Coagulation profile  Recent Labs Lab 03/23/13 2307  INR 1.40    CBC:  Recent Labs Lab 03/20/13 0420 03/23/13 1901 03/24/13 0600 03/25/13 0625  WBC 14.1* 12.7* 13.8* 12.8*  NEUTROABS  --  9.8*  --   --   HGB 8.7* 9.7* 9.3* 9.0*  HCT 27.6* 29.9* 28.6* 28.2*  MCV 79.8 78.9 78.8 78.6  PLT 337 310 316 284   Cardiac Enzymes:  Recent Labs Lab 03/24/13 0159  TROPONINI <0.30   CBG:  Recent Labs Lab 03/25/13 0623 03/25/13 1119 03/25/13 1629 03/25/13 2131 03/26/13 0614  GLUCAP 84 102* 92 76 87   Microbiology Recent Results (from the past 240 hour(s))  SURGICAL PCR SCREEN     Status: Abnormal   Collection Time    03/17/13 10:40 AM      Result Value Range Status   MRSA, PCR NEGATIVE  NEGATIVE Final   Staphylococcus aureus POSITIVE (*) NEGATIVE Final   Comment:            The Xpert SA Assay (FDA     approved for NASAL specimens     in patients over 83 years of age),     is one component of     a comprehensive surveillance     program.  Test performance has     been validated by [employer] for patients greater     than or equal to 101 year old.     It is not intended     to diagnose infection nor to     guide or monitor treatment.     Procedures and Diagnostic Studies: Dg Foot Complete Right  March 24, 2013  *RADIOLOGY REPORT*  Clinical Data: Swollen right foot.  RIGHT FOOT COMPLETE - 3+ VIEW  Comparison: 07/19/2012  Findings: Three views of the right foot were obtained.  Again noted are large erosions involving the distal aspect of the first metatarsal bone.  Again noted are erosions of the second toe DIP joint.  Alignment of the right foot has not changed.  There is no evidence for an acute fracture or dislocation.  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   Original Report Authenticated By: Richarda Overlie, M.D.     Scheduled Meds: . allopurinol  150 mg Oral Daily  . carvedilol  3.125 mg Oral BID WC  . colchicine  0.6 mg Oral BID  . hydrALAZINE  25 mg Oral TID  . insulin aspart  0-9 Units Subcutaneous TID WC  . insulin glargine  10 Units Subcutaneous BID  . isosorbide dinitrate  20 mg Oral TID  . levothyroxine  50 mcg Oral QAC breakfast  . pantoprazole  40 mg Oral Daily  . rivaroxaban  15 mg Oral Q lunch  . simvastatin  40 mg Oral QPM  . sodium chloride  3 mL Intravenous Q12H  . sodium chloride  3 mL Intravenous Q12H   Continuous Infusions: . sodium chloride  Time spent: 25 minutes   LOS: 3 days   RAMA,CHRISTINA  [facility] Pager [phone].  If 8PM-8AM, please contact night-coverage at [email or web address], password [facility] 03/26/2013, 7:24 AM

## 2013-06-24 ENCOUNTER — Telehealth (HOSPITAL_COMMUNITY): Payer: Self-pay | Admitting: Cardiovascular Disease

## 2013-07-01 ENCOUNTER — Telehealth (HOSPITAL_COMMUNITY): Payer: Self-pay | Admitting: Cardiovascular Disease

## 2013-07-06 IMAGING — CR DG CHEST 1V PORT
1 series · 1 of 1 positions shown · non-contrast
Comparison: 11/09/2011

CLINICAL DATA: Heart failure

PORTABLE CHEST - 1 VIEW

[AP]
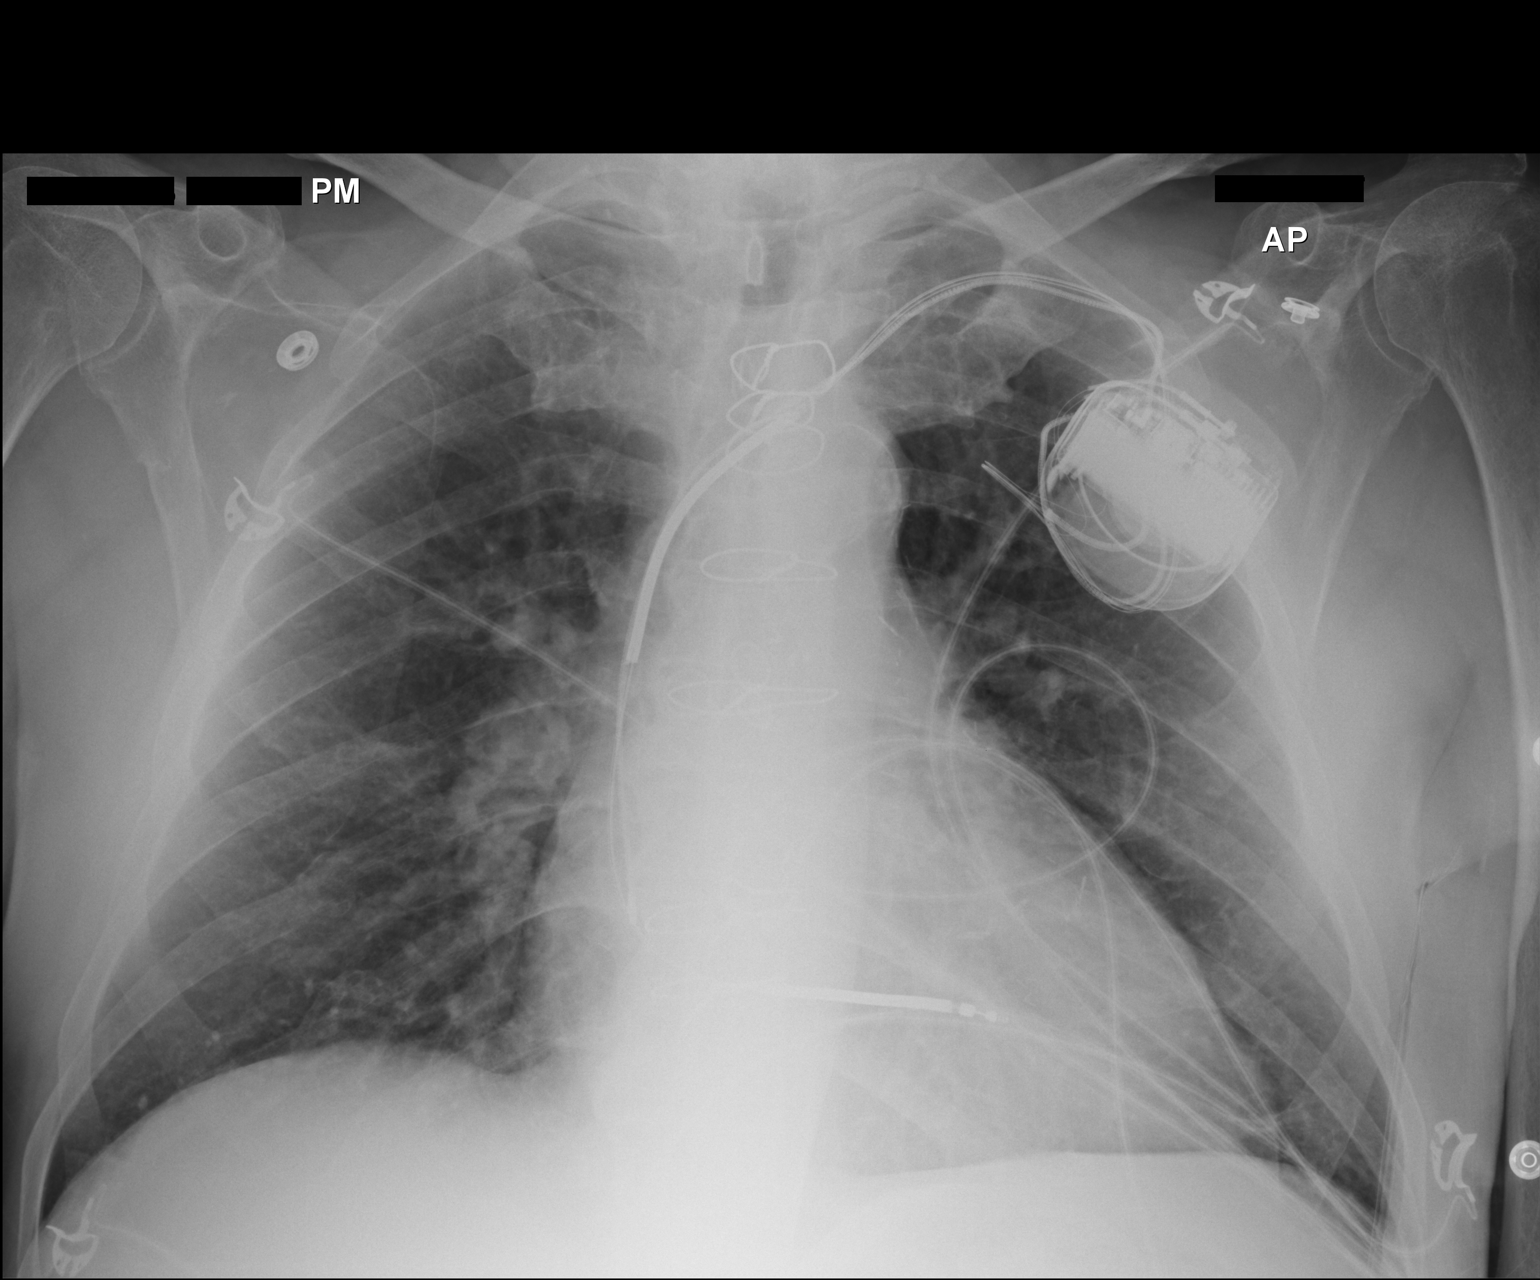

[1 of 1 positions shown; findings below may reference images not displayed]

FINDINGS: AICD remains in good position and unchanged.  Cardiac
enlargement.  Pulmonary vascularity normal.  Negative for edema or
effusion.  Negative for pneumonia
IMPRESSION: No active cardiopulmonary disease.

## 2013-07-09 ENCOUNTER — Telehealth (HOSPITAL_COMMUNITY): Payer: Self-pay | Admitting: Cardiovascular Disease

## 2013-07-28 ENCOUNTER — Telehealth (HOSPITAL_COMMUNITY): Payer: Self-pay | Admitting: Cardiovascular Disease

## 2013-08-04 ENCOUNTER — Encounter (HOSPITAL_COMMUNITY): Payer: Self-pay | Admitting: Cardiovascular Disease

## 2013-08-12 IMAGING — CR DG CHEST 1V PORT
1 series · 1 of 1 positions shown · non-contrast
Comparison: 06/11/2012

CLINICAL DATA: Hypertension.  Chest pain.

PORTABLE CHEST - 1 VIEW

[view not recorded]
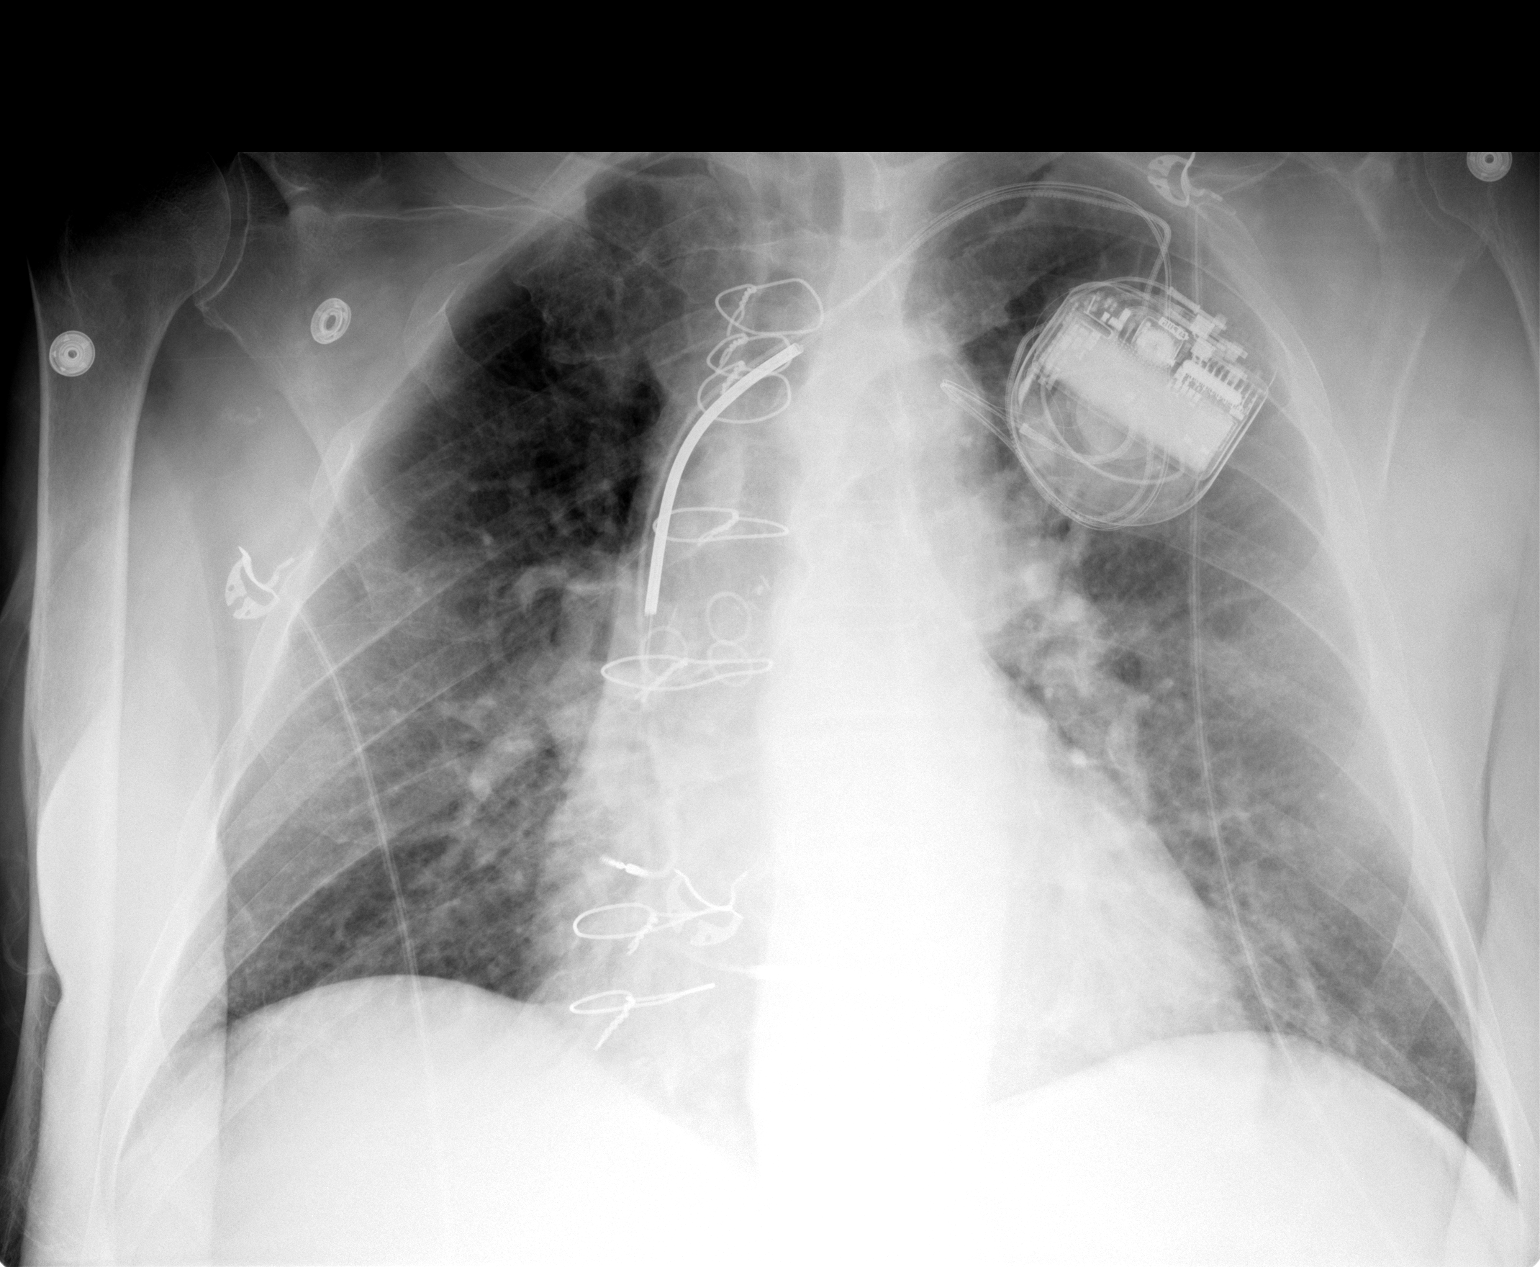

[1 of 1 positions shown; findings below may reference images not displayed]

FINDINGS: Left-sided AICD has leads overlying the right atrium and
right ventricle.  Heart is enlarged.  There is mild pulmonary
vascular congestion but no overt edema.  No focal consolidations or
pleural effusions are seen.  Patient has had median sternotomy and
CABG.
IMPRESSION: 1.  Cardiomegaly and vascular congestion.
2.  No focal consolidations.

## 2013-08-13 IMAGING — CR DG FOOT COMPLETE 3+V*R*
3 series · 3 of 3 positions shown · non-contrast
Comparison: Plain films of the left foot 05/27/2012.

CLINICAL DATA: Wounds on both feet.

RIGHT FOOT COMPLETE - 3+ VIEW

[view not recorded (1 of 3)]
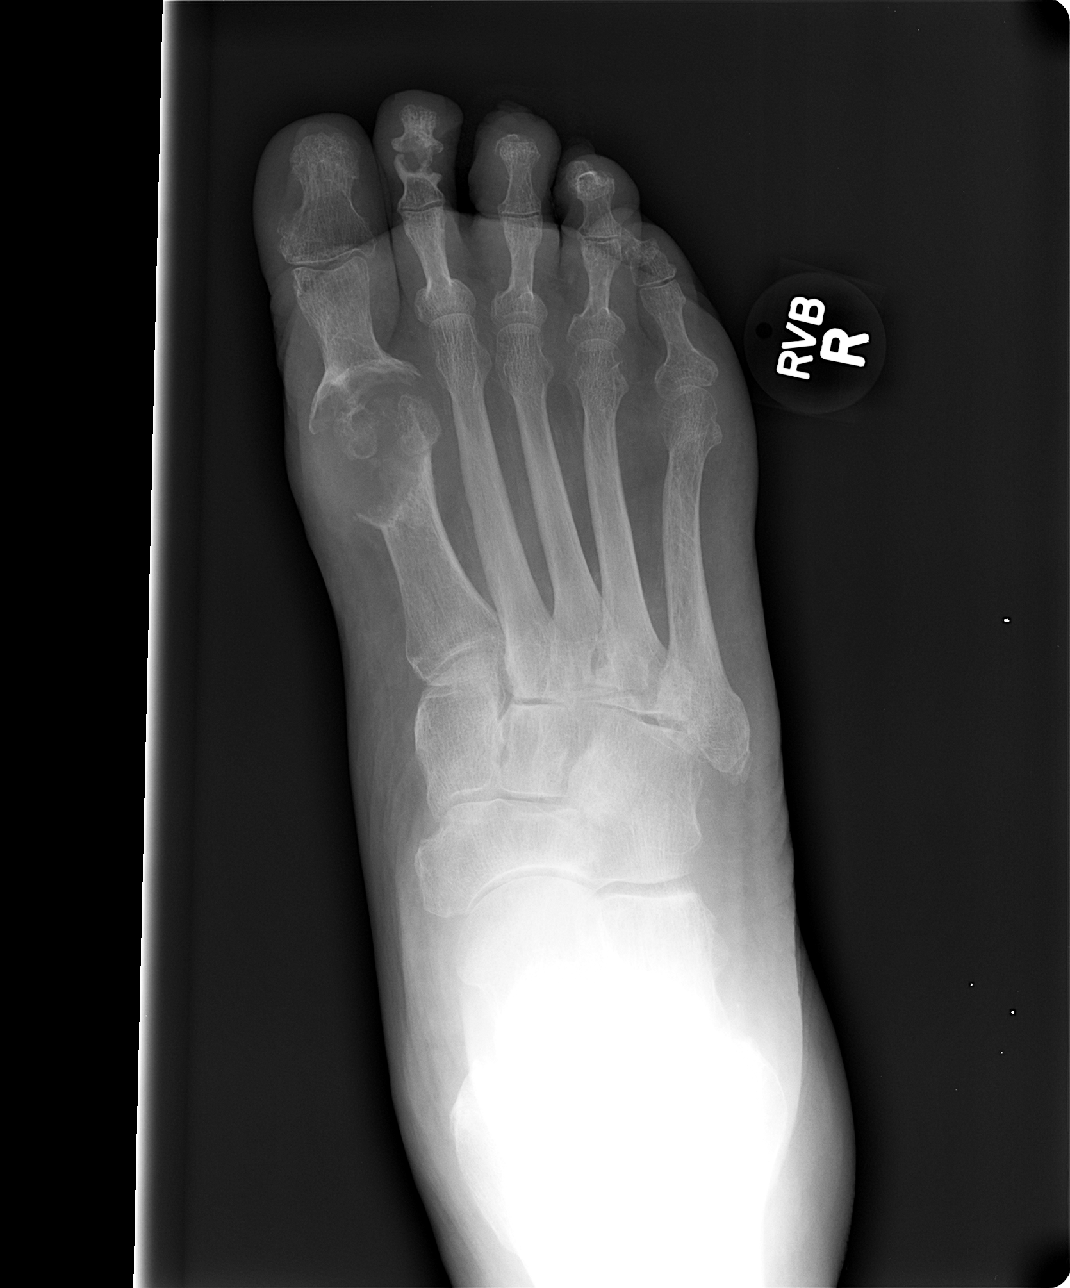

[view not recorded (2 of 3)]
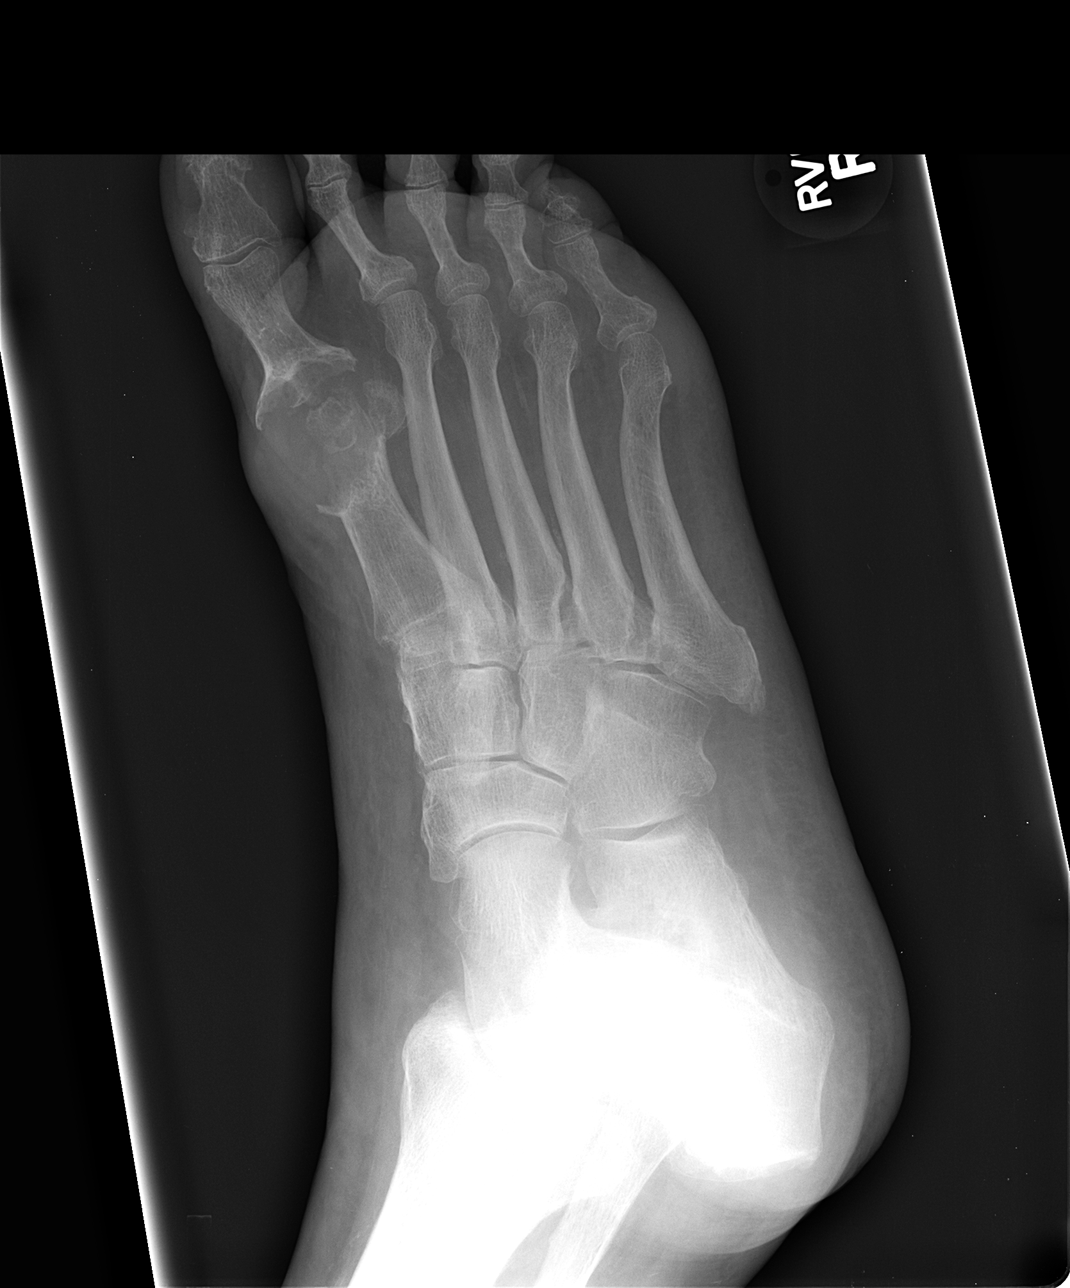

[view not recorded (3 of 3)]
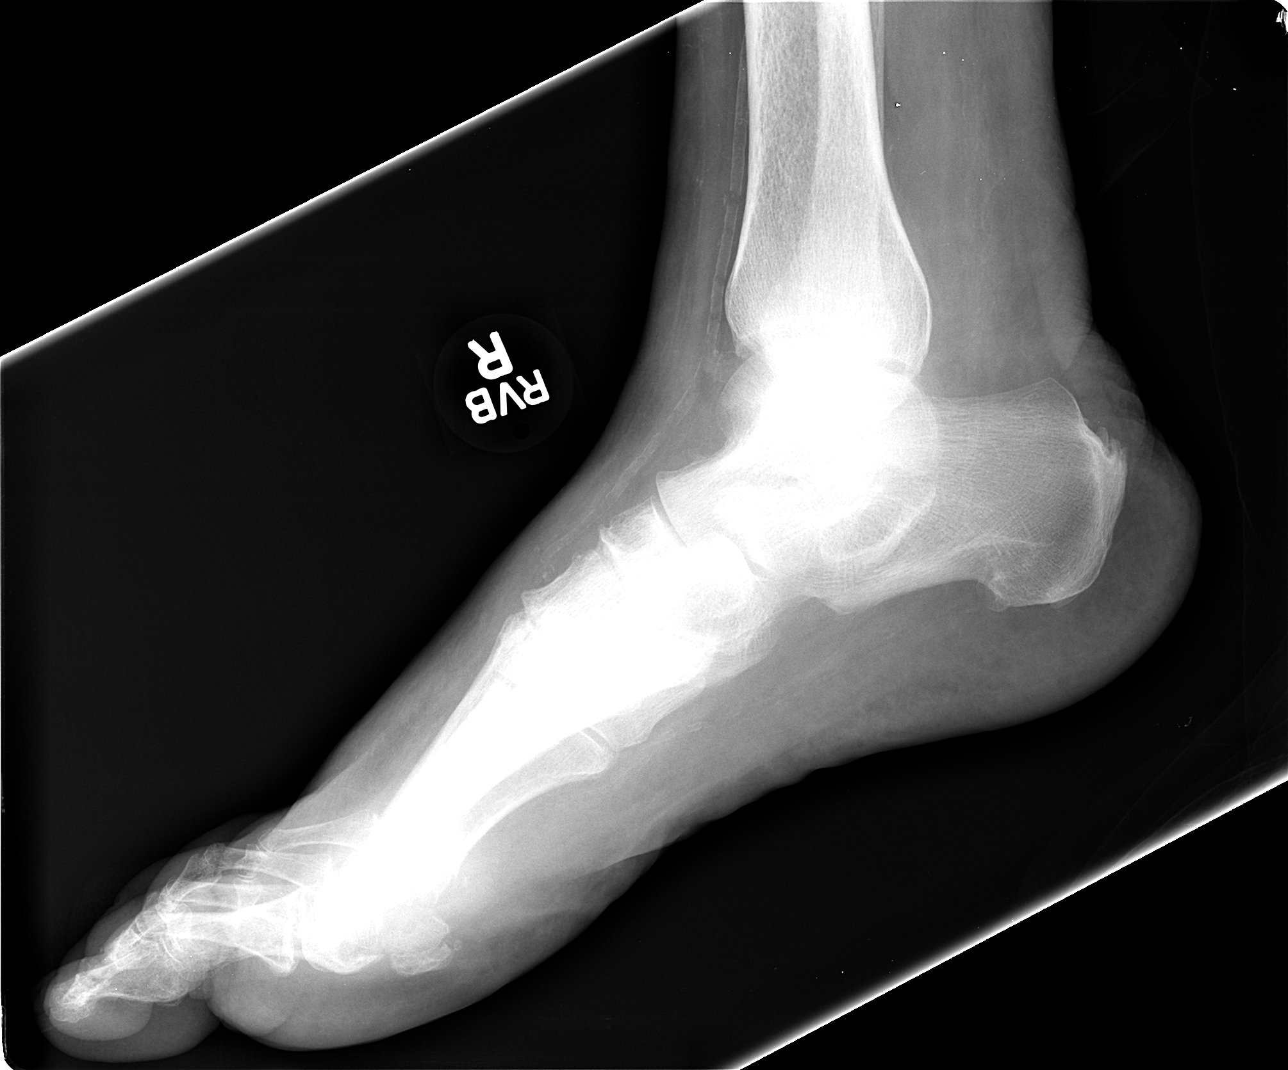

[3 of 3 positions shown; findings below may reference images not displayed]

FINDINGS: Extensive erosive change is present at the first MTP
joint and DIP joint of the second toe.  Similar changes are seen on
the comparison left foot.  Midfoot degenerative change is
identified.  No fracture.  Mild soft tissue swelling is noted.
IMPRESSION: Large erosions about the first MTP joint and IP joint of the second
toe likely due to gout.  No plain film evidence of osteomyelitis is
identified.

## 2013-08-18 ENCOUNTER — Ambulatory Visit: Payer: Self-pay | Admitting: Pharmacist Clinician (PhC)/ Clinical Pharmacy Specialist

## 2013-08-18 DIAGNOSIS — Z7901 Long term (current) use of anticoagulants: Secondary | ICD-10-CM

## 2013-08-18 DIAGNOSIS — I480 Paroxysmal atrial fibrillation: Secondary | ICD-10-CM

## 2013-09-15 ENCOUNTER — Encounter: Payer: Self-pay | Admitting: *Deleted

## 2014-01-05 ENCOUNTER — Telehealth (HOSPITAL_COMMUNITY): Payer: Self-pay | Admitting: *Deleted

## 2014-03-09 ENCOUNTER — Encounter: Payer: Self-pay | Admitting: *Deleted

## 2014-03-16 ENCOUNTER — Telehealth: Payer: Self-pay | Admitting: Cardiovascular Disease

## 2014-03-16 NOTE — Telephone Encounter (Signed)
03-16-14 called pt to set up past due defib ck, per granddaughter pt in [place] hospice and not expected to live for more than a few weeks maybe a few months/mt

## 2014-05-03 DEATH — deceased

## 2014-11-11 ENCOUNTER — Encounter (HOSPITAL_COMMUNITY): Payer: Self-pay | Admitting: Cardiovascular Disease
